# Patient Record
Sex: Female | Born: 1935
Health system: Southern US, Community
[De-identification: ages and names within clinical notes are randomized; demographics above are authoritative.]

## PROBLEM LIST (undated history)

## (undated) DIAGNOSIS — I10 Essential (primary) hypertension: Secondary | ICD-10-CM

## (undated) DIAGNOSIS — K219 Gastro-esophageal reflux disease without esophagitis: Secondary | ICD-10-CM

## (undated) DIAGNOSIS — M48061 Spinal stenosis, lumbar region without neurogenic claudication: Secondary | ICD-10-CM

## (undated) DIAGNOSIS — K5790 Diverticulosis of intestine, part unspecified, without perforation or abscess without bleeding: Secondary | ICD-10-CM

## (undated) DIAGNOSIS — G2581 Restless legs syndrome: Secondary | ICD-10-CM

## (undated) DIAGNOSIS — W19XXXA Unspecified fall, initial encounter: Secondary | ICD-10-CM

## (undated) DIAGNOSIS — M179 Osteoarthritis of knee, unspecified: Secondary | ICD-10-CM

## (undated) DIAGNOSIS — R296 Repeated falls: Secondary | ICD-10-CM

## (undated) DIAGNOSIS — K589 Irritable bowel syndrome without diarrhea: Secondary | ICD-10-CM

## (undated) DIAGNOSIS — M1990 Unspecified osteoarthritis, unspecified site: Secondary | ICD-10-CM

## (undated) DIAGNOSIS — E7800 Pure hypercholesterolemia, unspecified: Secondary | ICD-10-CM

## (undated) DIAGNOSIS — M1710 Unilateral primary osteoarthritis, unspecified knee: Secondary | ICD-10-CM

## (undated) DIAGNOSIS — C50919 Malignant neoplasm of unspecified site of unspecified female breast: Secondary | ICD-10-CM

## (undated) DIAGNOSIS — E559 Vitamin D deficiency, unspecified: Secondary | ICD-10-CM

## (undated) DIAGNOSIS — I4891 Unspecified atrial fibrillation: Secondary | ICD-10-CM

## (undated) HISTORY — DX: Essential (primary) hypertension: I10

## (undated) HISTORY — DX: Irritable bowel syndrome, unspecified: K58.9

## (undated) HISTORY — DX: Osteoarthritis of knee, unspecified: M17.9

## (undated) HISTORY — DX: Unspecified atrial fibrillation: I48.91

## (undated) HISTORY — DX: Unilateral primary osteoarthritis, unspecified knee: M17.10

## (undated) HISTORY — DX: Unspecified osteoarthritis, unspecified site: M19.90

## (undated) HISTORY — DX: Restless legs syndrome: G25.81

## (undated) HISTORY — DX: Vitamin D deficiency, unspecified: E55.9

## (undated) HISTORY — DX: Diverticulosis of intestine, part unspecified, without perforation or abscess without bleeding: K57.90

## (undated) HISTORY — DX: Unspecified fall, initial encounter: W19.XXXA

## (undated) HISTORY — DX: Malignant neoplasm of unspecified site of unspecified female breast: C50.919

## (undated) HISTORY — DX: Pure hypercholesterolemia, unspecified: E78.00

## (undated) HISTORY — PX: WRIST FRACTURE SURGERY: SHX121

## (undated) HISTORY — DX: Spinal stenosis, lumbar region without neurogenic claudication: M48.061

## (undated) HISTORY — DX: Repeated falls: R29.6

## (undated) HISTORY — DX: Gastro-esophageal reflux disease without esophagitis: K21.9

---

## 1985-05-26 HISTORY — PX: MASTECTOMY, RADICAL: SHX710

## 1986-05-26 HISTORY — PX: BREAST RECONSTRUCTION: SHX9

## 1998-03-20 ENCOUNTER — Other Ambulatory Visit: Admission: RE | Admit: 1998-03-20 | Discharge: 1998-03-20 | Payer: Self-pay | Admitting: Obstetrics and Gynecology

## 1999-03-12 ENCOUNTER — Other Ambulatory Visit: Admission: RE | Admit: 1999-03-12 | Discharge: 1999-03-12 | Payer: Self-pay | Admitting: Obstetrics and Gynecology

## 1999-05-23 ENCOUNTER — Encounter: Payer: Self-pay | Admitting: Obstetrics and Gynecology

## 1999-05-23 ENCOUNTER — Encounter: Admission: RE | Admit: 1999-05-23 | Discharge: 1999-05-23 | Payer: Self-pay | Admitting: Obstetrics and Gynecology

## 1999-06-06 ENCOUNTER — Ambulatory Visit (HOSPITAL_COMMUNITY): Admission: RE | Admit: 1999-06-06 | Discharge: 1999-06-06 | Payer: Self-pay | Admitting: Gastroenterology

## 2000-04-14 ENCOUNTER — Other Ambulatory Visit: Admission: RE | Admit: 2000-04-14 | Discharge: 2000-04-14 | Payer: Self-pay | Admitting: Obstetrics and Gynecology

## 2000-05-25 ENCOUNTER — Encounter: Admission: RE | Admit: 2000-05-25 | Discharge: 2000-05-25 | Payer: Self-pay | Admitting: Oncology

## 2000-05-25 ENCOUNTER — Encounter (HOSPITAL_COMMUNITY): Payer: Self-pay | Admitting: Oncology

## 2001-04-27 ENCOUNTER — Encounter (HOSPITAL_COMMUNITY): Admission: RE | Admit: 2001-04-27 | Discharge: 2001-05-27 | Payer: Self-pay | Admitting: Oncology

## 2001-04-27 ENCOUNTER — Encounter: Admission: RE | Admit: 2001-04-27 | Discharge: 2001-04-27 | Payer: Self-pay | Admitting: Oncology

## 2001-06-01 ENCOUNTER — Encounter (HOSPITAL_COMMUNITY): Payer: Self-pay | Admitting: Oncology

## 2001-06-01 ENCOUNTER — Encounter: Admission: RE | Admit: 2001-06-01 | Discharge: 2001-06-01 | Payer: Self-pay | Admitting: Oncology

## 2001-06-22 ENCOUNTER — Other Ambulatory Visit: Admission: RE | Admit: 2001-06-22 | Discharge: 2001-06-22 | Payer: Self-pay | Admitting: Obstetrics and Gynecology

## 2002-04-29 ENCOUNTER — Encounter: Admission: RE | Admit: 2002-04-29 | Discharge: 2002-04-29 | Payer: Self-pay | Admitting: Oncology

## 2002-04-29 ENCOUNTER — Encounter (HOSPITAL_COMMUNITY): Admission: RE | Admit: 2002-04-29 | Discharge: 2002-05-29 | Payer: Self-pay | Admitting: Oncology

## 2002-06-02 ENCOUNTER — Encounter (HOSPITAL_COMMUNITY): Payer: Self-pay | Admitting: Oncology

## 2002-06-02 ENCOUNTER — Encounter: Admission: RE | Admit: 2002-06-02 | Discharge: 2002-06-02 | Payer: Self-pay | Admitting: Oncology

## 2002-06-07 ENCOUNTER — Other Ambulatory Visit: Admission: RE | Admit: 2002-06-07 | Discharge: 2002-06-07 | Payer: Self-pay | Admitting: Obstetrics and Gynecology

## 2003-05-01 ENCOUNTER — Encounter (HOSPITAL_COMMUNITY): Admission: RE | Admit: 2003-05-01 | Discharge: 2003-05-31 | Payer: Self-pay | Admitting: Oncology

## 2003-05-01 ENCOUNTER — Encounter: Admission: RE | Admit: 2003-05-01 | Discharge: 2003-05-01 | Payer: Self-pay | Admitting: Oncology

## 2003-05-27 HISTORY — PX: KNEE ARTHROSCOPY: SUR90

## 2003-06-05 ENCOUNTER — Encounter: Admission: RE | Admit: 2003-06-05 | Discharge: 2003-06-05 | Payer: Self-pay | Admitting: Oncology

## 2003-06-05 ENCOUNTER — Encounter (HOSPITAL_COMMUNITY): Admission: RE | Admit: 2003-06-05 | Discharge: 2003-07-05 | Payer: Self-pay | Admitting: Oncology

## 2003-06-06 ENCOUNTER — Ambulatory Visit (HOSPITAL_COMMUNITY): Admission: RE | Admit: 2003-06-06 | Discharge: 2003-06-06 | Payer: Self-pay | Admitting: Oncology

## 2004-04-29 ENCOUNTER — Encounter: Admission: RE | Admit: 2004-04-29 | Discharge: 2004-05-24 | Payer: Self-pay | Admitting: Oncology

## 2004-04-29 ENCOUNTER — Encounter (HOSPITAL_COMMUNITY): Admission: RE | Admit: 2004-04-29 | Discharge: 2004-05-24 | Payer: Self-pay | Admitting: Oncology

## 2004-04-29 ENCOUNTER — Ambulatory Visit (HOSPITAL_COMMUNITY): Payer: Self-pay | Admitting: Oncology

## 2004-06-06 ENCOUNTER — Encounter (HOSPITAL_COMMUNITY): Admission: RE | Admit: 2004-06-06 | Discharge: 2004-07-06 | Payer: Self-pay | Admitting: Oncology

## 2004-06-06 ENCOUNTER — Encounter: Admission: RE | Admit: 2004-06-06 | Discharge: 2004-06-06 | Payer: Self-pay | Admitting: Oncology

## 2005-04-28 ENCOUNTER — Encounter: Admission: RE | Admit: 2005-04-28 | Discharge: 2005-04-28 | Payer: Self-pay | Admitting: Oncology

## 2005-04-28 ENCOUNTER — Encounter (HOSPITAL_COMMUNITY): Admission: RE | Admit: 2005-04-28 | Discharge: 2005-04-28 | Payer: Self-pay | Admitting: Oncology

## 2005-04-28 ENCOUNTER — Ambulatory Visit (HOSPITAL_COMMUNITY): Payer: Self-pay | Admitting: Oncology

## 2005-06-09 ENCOUNTER — Ambulatory Visit (HOSPITAL_COMMUNITY): Admission: RE | Admit: 2005-06-09 | Discharge: 2005-06-09 | Payer: Self-pay | Admitting: Internal Medicine

## 2006-06-03 ENCOUNTER — Encounter (HOSPITAL_COMMUNITY): Admission: RE | Admit: 2006-06-03 | Discharge: 2006-07-03 | Payer: Self-pay | Admitting: Oncology

## 2006-06-03 ENCOUNTER — Ambulatory Visit (HOSPITAL_COMMUNITY): Payer: Self-pay | Admitting: Oncology

## 2007-06-02 ENCOUNTER — Ambulatory Visit (HOSPITAL_COMMUNITY): Payer: Self-pay | Admitting: Oncology

## 2007-06-02 ENCOUNTER — Encounter (HOSPITAL_COMMUNITY): Admission: RE | Admit: 2007-06-02 | Discharge: 2007-07-02 | Payer: Self-pay | Admitting: Oncology

## 2007-06-21 ENCOUNTER — Ambulatory Visit (HOSPITAL_COMMUNITY): Admission: RE | Admit: 2007-06-21 | Discharge: 2007-06-21 | Payer: Self-pay | Admitting: Oncology

## 2008-01-13 ENCOUNTER — Emergency Department (HOSPITAL_COMMUNITY): Admission: EM | Admit: 2008-01-13 | Discharge: 2008-01-14 | Payer: Self-pay | Admitting: Emergency Medicine

## 2008-03-01 ENCOUNTER — Encounter: Admission: RE | Admit: 2008-03-01 | Discharge: 2008-03-01 | Payer: Self-pay | Admitting: General Surgery

## 2008-05-23 ENCOUNTER — Ambulatory Visit: Payer: Self-pay | Admitting: Cardiovascular Disease

## 2008-05-31 ENCOUNTER — Encounter (HOSPITAL_COMMUNITY): Admission: RE | Admit: 2008-05-31 | Discharge: 2008-06-30 | Payer: Self-pay | Admitting: Oncology

## 2008-05-31 ENCOUNTER — Ambulatory Visit (HOSPITAL_COMMUNITY): Payer: Self-pay | Admitting: Oncology

## 2009-04-13 DIAGNOSIS — G2581 Restless legs syndrome: Secondary | ICD-10-CM | POA: Insufficient documentation

## 2009-04-13 DIAGNOSIS — M1990 Unspecified osteoarthritis, unspecified site: Secondary | ICD-10-CM

## 2009-04-13 DIAGNOSIS — E785 Hyperlipidemia, unspecified: Secondary | ICD-10-CM | POA: Insufficient documentation

## 2009-04-16 ENCOUNTER — Encounter: Payer: Self-pay | Admitting: Physician Assistant

## 2009-04-16 ENCOUNTER — Ambulatory Visit: Payer: Self-pay | Admitting: Internal Medicine

## 2009-04-16 ENCOUNTER — Encounter: Payer: Self-pay | Admitting: Cardiovascular Disease

## 2009-04-16 DIAGNOSIS — I4891 Unspecified atrial fibrillation: Secondary | ICD-10-CM | POA: Insufficient documentation

## 2009-04-20 ENCOUNTER — Ambulatory Visit: Payer: Self-pay | Admitting: Internal Medicine

## 2009-04-26 ENCOUNTER — Ambulatory Visit: Payer: Self-pay

## 2009-04-26 ENCOUNTER — Encounter: Payer: Self-pay | Admitting: Cardiovascular Disease

## 2009-04-26 ENCOUNTER — Ambulatory Visit: Payer: Self-pay | Admitting: Cardiovascular Disease

## 2009-04-26 ENCOUNTER — Ambulatory Visit: Payer: Self-pay | Admitting: Cardiology

## 2009-04-26 ENCOUNTER — Ambulatory Visit (HOSPITAL_COMMUNITY): Admission: RE | Admit: 2009-04-26 | Discharge: 2009-04-26 | Payer: Self-pay | Admitting: Cardiovascular Disease

## 2009-04-26 LAB — CONVERTED CEMR LAB: POC INR: 1.9

## 2009-05-03 ENCOUNTER — Ambulatory Visit: Payer: Self-pay | Admitting: Cardiovascular Disease

## 2009-05-11 ENCOUNTER — Telehealth (INDEPENDENT_AMBULATORY_CARE_PROVIDER_SITE_OTHER): Payer: Self-pay | Admitting: *Deleted

## 2009-05-11 ENCOUNTER — Ambulatory Visit: Payer: Self-pay | Admitting: Cardiovascular Disease

## 2009-06-21 ENCOUNTER — Ambulatory Visit (HOSPITAL_COMMUNITY): Admission: RE | Admit: 2009-06-21 | Discharge: 2009-06-21 | Payer: Self-pay | Admitting: Oncology

## 2009-06-27 ENCOUNTER — Ambulatory Visit (HOSPITAL_COMMUNITY): Admission: RE | Admit: 2009-06-27 | Discharge: 2009-06-27 | Payer: Self-pay | Admitting: Oncology

## 2009-07-02 ENCOUNTER — Ambulatory Visit (HOSPITAL_COMMUNITY): Payer: Self-pay | Admitting: Oncology

## 2009-07-02 ENCOUNTER — Encounter (HOSPITAL_COMMUNITY): Admission: RE | Admit: 2009-07-02 | Discharge: 2009-08-01 | Payer: Self-pay | Admitting: Oncology

## 2009-11-06 ENCOUNTER — Ambulatory Visit: Payer: Self-pay | Admitting: Cardiovascular Disease

## 2010-02-18 ENCOUNTER — Encounter: Payer: Self-pay | Admitting: Internal Medicine

## 2010-05-21 ENCOUNTER — Encounter: Payer: Self-pay | Admitting: Internal Medicine

## 2010-05-21 ENCOUNTER — Telehealth: Payer: Self-pay | Admitting: Internal Medicine

## 2010-05-24 ENCOUNTER — Ambulatory Visit: Admission: RE | Admit: 2010-05-24 | Discharge: 2010-05-24 | Payer: Self-pay | Source: Home / Self Care

## 2010-05-24 ENCOUNTER — Encounter (INDEPENDENT_AMBULATORY_CARE_PROVIDER_SITE_OTHER): Payer: Self-pay | Admitting: Radiology

## 2010-05-24 ENCOUNTER — Encounter: Payer: Self-pay | Admitting: Cardiovascular Disease

## 2010-05-24 ENCOUNTER — Ambulatory Visit (HOSPITAL_COMMUNITY)
Admission: RE | Admit: 2010-05-24 | Discharge: 2010-05-24 | Payer: Self-pay | Source: Home / Self Care | Attending: Cardiovascular Disease | Admitting: Cardiovascular Disease

## 2010-05-24 LAB — CONVERTED CEMR LAB: POC INR: 2.6

## 2010-05-26 HISTORY — PX: CARDIOVERSION: SHX1299

## 2010-05-28 ENCOUNTER — Encounter: Payer: Self-pay | Admitting: Physician Assistant

## 2010-05-29 ENCOUNTER — Other Ambulatory Visit: Payer: Self-pay | Admitting: Physician Assistant

## 2010-05-29 ENCOUNTER — Ambulatory Visit
Admission: RE | Admit: 2010-05-29 | Discharge: 2010-05-29 | Payer: Self-pay | Source: Home / Self Care | Attending: Cardiovascular Disease | Admitting: Cardiovascular Disease

## 2010-05-29 ENCOUNTER — Ambulatory Visit: Admission: RE | Admit: 2010-05-29 | Discharge: 2010-05-29 | Payer: Self-pay | Source: Home / Self Care

## 2010-05-29 ENCOUNTER — Ambulatory Visit
Admission: RE | Admit: 2010-05-29 | Discharge: 2010-05-29 | Payer: Self-pay | Source: Home / Self Care | Attending: Physician Assistant | Admitting: Physician Assistant

## 2010-05-29 ENCOUNTER — Encounter: Payer: Self-pay | Admitting: Physician Assistant

## 2010-05-29 DIAGNOSIS — I420 Dilated cardiomyopathy: Secondary | ICD-10-CM | POA: Insufficient documentation

## 2010-05-29 LAB — CBC WITH DIFFERENTIAL/PLATELET
Basophils Absolute: 0 10*3/uL (ref 0.0–0.1)
Basophils Relative: 0.3 % (ref 0.0–3.0)
Eosinophils Absolute: 0.1 10*3/uL (ref 0.0–0.7)
Eosinophils Relative: 0.7 % (ref 0.0–5.0)
HCT: 44.2 % (ref 36.0–46.0)
Hemoglobin: 15.1 g/dL — ABNORMAL HIGH (ref 12.0–15.0)
Lymphocytes Relative: 20.3 % (ref 12.0–46.0)
Lymphs Abs: 2.3 10*3/uL (ref 0.7–4.0)
MCHC: 34.2 g/dL (ref 30.0–36.0)
MCV: 91.2 fl (ref 78.0–100.0)
Monocytes Absolute: 0.9 10*3/uL (ref 0.1–1.0)
Monocytes Relative: 7.6 % (ref 3.0–12.0)
Neutro Abs: 8 10*3/uL — ABNORMAL HIGH (ref 1.4–7.7)
Neutrophils Relative %: 71.1 % (ref 43.0–77.0)
Platelets: 241 10*3/uL (ref 150.0–400.0)
RBC: 4.84 Mil/uL (ref 3.87–5.11)
RDW: 14.2 % (ref 11.5–14.6)
WBC: 11.2 10*3/uL — ABNORMAL HIGH (ref 4.5–10.5)

## 2010-05-29 LAB — BASIC METABOLIC PANEL
BUN: 12 mg/dL (ref 6–23)
CO2: 28 mEq/L (ref 19–32)
Calcium: 9.5 mg/dL (ref 8.4–10.5)
Chloride: 105 mEq/L (ref 96–112)
Creatinine, Ser: 0.7 mg/dL (ref 0.4–1.2)
GFR: 85.12 mL/min (ref >60.00)
Glucose, Bld: 91 mg/dL (ref 70–99)
Potassium: 4.4 mEq/L (ref 3.5–5.1)
Sodium: 140 mEq/L (ref 135–145)

## 2010-05-29 LAB — BRAIN NATRIURETIC PEPTIDE: Pro B Natriuretic peptide (BNP): 941.1 pg/mL — ABNORMAL HIGH (ref 0.0–100.0)

## 2010-05-29 LAB — TSH: TSH: 3.57 u[IU]/mL (ref 0.35–5.50)

## 2010-06-05 ENCOUNTER — Telehealth (INDEPENDENT_AMBULATORY_CARE_PROVIDER_SITE_OTHER): Payer: Self-pay | Admitting: *Deleted

## 2010-06-06 ENCOUNTER — Ambulatory Visit: Admission: RE | Admit: 2010-06-06 | Discharge: 2010-06-06 | Payer: Self-pay | Source: Home / Self Care

## 2010-06-06 ENCOUNTER — Encounter: Payer: Self-pay | Admitting: Cardiovascular Disease

## 2010-06-06 ENCOUNTER — Encounter (HOSPITAL_COMMUNITY)
Admission: RE | Admit: 2010-06-06 | Discharge: 2010-06-25 | Payer: Self-pay | Source: Home / Self Care | Attending: Cardiovascular Disease | Admitting: Cardiovascular Disease

## 2010-06-07 ENCOUNTER — Ambulatory Visit
Admission: RE | Admit: 2010-06-07 | Discharge: 2010-06-07 | Payer: Self-pay | Source: Home / Self Care | Attending: Physician Assistant | Admitting: Physician Assistant

## 2010-06-07 ENCOUNTER — Other Ambulatory Visit: Payer: Self-pay | Admitting: Physician Assistant

## 2010-06-07 ENCOUNTER — Encounter: Payer: Self-pay | Admitting: Physician Assistant

## 2010-06-07 DIAGNOSIS — I5020 Unspecified systolic (congestive) heart failure: Secondary | ICD-10-CM | POA: Insufficient documentation

## 2010-06-07 LAB — BASIC METABOLIC PANEL
BUN: 11 mg/dL (ref 6–23)
CO2: 32 mEq/L (ref 19–32)
Calcium: 9.2 mg/dL (ref 8.4–10.5)
Chloride: 103 mEq/L (ref 96–112)
Creatinine, Ser: 0.7 mg/dL (ref 0.4–1.2)
GFR: 91 mL/min (ref >60.00)
Glucose, Bld: 98 mg/dL (ref 70–99)
Potassium: 3.9 mEq/L (ref 3.5–5.1)
Sodium: 144 mEq/L (ref 135–145)

## 2010-06-07 LAB — BRAIN NATRIURETIC PEPTIDE: Pro B Natriuretic peptide (BNP): 521.9 pg/mL — ABNORMAL HIGH (ref 0.0–100.0)

## 2010-06-13 ENCOUNTER — Ambulatory Visit: Admission: RE | Admit: 2010-06-13 | Discharge: 2010-06-13 | Payer: Self-pay | Source: Home / Self Care

## 2010-06-13 LAB — CONVERTED CEMR LAB: POC INR: 1.6

## 2010-06-20 ENCOUNTER — Ambulatory Visit: Admission: RE | Admit: 2010-06-20 | Discharge: 2010-06-20 | Payer: Self-pay | Source: Home / Self Care

## 2010-06-20 ENCOUNTER — Ambulatory Visit: Admit: 2010-06-20 | Payer: Self-pay | Admitting: Cardiovascular Disease

## 2010-06-24 ENCOUNTER — Ambulatory Visit (HOSPITAL_COMMUNITY)
Admission: RE | Admit: 2010-06-24 | Discharge: 2010-06-24 | Payer: Self-pay | Source: Home / Self Care | Attending: Oncology | Admitting: Oncology

## 2010-06-27 NOTE — Progress Notes (Signed)
Summary: Nuclear Pre-Procedure  Phone Note Outgoing Call Call back at [facility] Phone [phone]   Call placed by: Stanton Kidney, EMT-P,  June 05, 2010 11:33 AM Call placed to: Patient Action Taken: Phone Call Completed Summary of Call: Reviewed information on Myoview Information Sheet (see scanned document for further details).  Spoke with the patient. Stanton Kidney, EMT-P  June 05, 2010 11:33 AM     Nuclear Med Background Indications for Stress Test: Evaluation for Ischemia   History: Echo  History Comments: 12/11 Echo: EF=30%, mild AS  Symptoms: Chest Pressure with Exertion, Chest Tightness, Diaphoresis, DOE, Light-Headedness, Nausea, SOB    Nuclear Pre-Procedure Cardiac Risk Factors: Family History - CAD, Lipids Height (in): 65  Nuclear Med Study Referring MD:  Dr Eden Emms

## 2010-06-27 NOTE — Medication Information (Signed)
Summary: rov/ewj  Anticoagulant Therapy  Managed by: Weston Brass, PharmD Referring MD: Dr Eden Emms PCP: Dr Marisue Brooklyn Supervising MD: Myrtis Ser MD, Tinnie Gens Indication 1: Atrial Fibrillation Lab Used: [facility] [place] Site: [address] INR POC 2.1 INR RANGE 2.0-3.0  Dietary changes: no    Health status changes: no    Bleeding/hemorrhagic complications: no    Recent/future hospitalizations: no    Any changes in medication regimen? no    Recent/future dental: no  Any missed doses?: no       Is patient compliant with meds? yes       Allergies: No Known Drug Allergies  Anticoagulation Management History:      The patient is taking warfarin and comes in today for a routine follow up visit.  Positive risk factors for bleeding include an age of 75 years or older.  The bleeding index is 'intermediate risk'.  Positive CHADS2 values include History of CHF and Age > 75 years old.  Anticoagulation responsible provider: Myrtis Ser MD, Tinnie Gens.  INR POC: 2.1.  Cuvette Lot#: [id number].  Exp: 06/2011.    Anticoagulation Management Assessment/Plan:      The patient's current anticoagulation dose is Warfarin sodium 2.5 mg tabs: take one tablet daily as directed by anticoagulation clinic.  The target INR is 2.0-3.0.  The next INR is due 07/09/2010.  Anticoagulation instructions were given to patient.  Results were reviewed/authorized by Weston Brass, PharmD.  She was notified by Linward Headland, PharmD candidate.         Prior Anticoagulation Instructions: INR 1.6  Start taking 1/2 tablet daily except 1 tablet on Thursdays.  Recheck in 1 week.   Current Anticoagulation Instructions: INR 2.1 (INR goal: 2-3)  Take 1/2 tablet everyday except 1 tablet on Thursdays.  Recheck on 02/14.

## 2010-06-27 NOTE — Assessment & Plan Note (Signed)
Summary: f1w...cmf/ f/u myoview/ chest xray./sl   Visit Type:  Follow-up Primary Provider:  Dr Marisue Brooklyn   History of Present Illness: Primary Cardiologist:  Dr. Charlton Haws  Pamela Whitaker is a 77 -year-old female with a history of paroxysmal atrial fibrillation.  She was placed on Multaq for about 6 months and maintained sinus rhythm.  Dr. Eden Emms stopped this medication in June of 2011.  She recently called the office with complaints of shortness of breath and an electrocardiogram demonstrated recurrent atrial fibrillation with rapid ventricular rate.  She was placed on coumadin and metoprolol and an echocardiogram was obtained.  She has reduced ejection fraction at 30% with diffuse hypokinesis, very mild AS with a mean gradient of 4, mild MR, mild LAE and mod RAE and PASP 35 mmHg.  I saw her last week and thought she was in systolic heart failure.  I placed her on Lasix.  Lab work demonstrated a BNP of 941.  Her chest x-ray did show edema.  Her creatinine was normal at 0.7 and hemoglobin was 15.1.  TSH was normal at 3.57.  She was set up for a Myoview study which was done yesterday.  This demonstrated no scar or ischemia and her ejection fraction is 31%.  She returns for followup.  She feels much better.  She is down about 4 pounds by our scales since her last visit.  She denies orthopnea, PND or pedal edema.  She denies significant shortness of breath.  She denies any further chest discomfort.  No cough.  Current Medications (verified): 1)  Glucosamine-Chondroitin  Caps (Glucosamine-Chondroit-Vit C-Mn) .... Take One Twice Daily 2)  Melatonin 5 Mg Caps (Melatonin) .... Take Two At Bedtime 3)  Warfarin Sodium 2.5 Mg Tabs (Warfarin Sodium) .... Take One Tablet Daily As Directed By Anticoagulation Clinic 4)  Metoprolol Tartrate 50 Mg Tabs (Metoprolol Tartrate) .... Take 1 1/2 Tablets Twice Daily 5)  Lunesta 2 Mg Tabs (Eszopiclone) .... Take One Tablet At Bedtime 6)  Furosemide 20 Mg Tabs  (Furosemide) .... Take 1 Tablet Once Daily  Allergies (verified): No Known Drug Allergies  Past History:  Past Medical History: Parox AFib   a. treated with Multaq x 6 mos in 2011   b. CHADS2=2 (+Age; +CHF; no HTN,  no CVA; no DM2) Echo 05/24/10: EF 30%; mild AS (mean 4 mmHg); mild MR; mild LAE; mod RAE; PASP 35 Systolic CHF Dilated Cardiomyopathy   a. tachy mediated Myoview 06/06/10:  EF 31%; no scar or ischemia breast cancer in 1987 with a left  mastectomy hyperlipidemia previous right fibular fracture, operated on by Dr. Ranell Patrick osteoporosis restless leg syndrome osteoarthritis history of bone chips in the right knee lumbar stenosis previous right knee arthroscopy in 2005.    Review of Systems       As per  the HPI.  All other systems reviewed and negative.   Vital Signs:  Patient profile:   77 year old female Height:      65 inches Weight:      148 pounds BMI:     24.72 Pulse rate:   104 / minute BP sitting:   110 / 82  (left arm)  Vitals Entered By: Laurance Flatten CMA (June 07, 2010 11:15 AM)  Physical Exam  General:  Well nourished, well developed, in no acute distress HEENT: normal Neck: no JVD Cardiac:  normal S1, S2; irreg irreg rhythm Lungs:  clear to auscultation bilaterally, no wheezing, rhonchi or rales Abd: soft, nontender, no hepatomegaly Ext:  no  edema Vascular: no carotid  bruits Skin: warm and dry Neuro:  CNs 2-12 intact, no focal abnormalities noted Endo: no thyromegaly   EKG  Procedure date:  06/07/2010  Findings:      atrial fibrillation Heart rate 104 PVCs Nonspecific ST-T wave changes Incomplete right bundle branch block Poor R-wave progression  Impression & Recommendations:  Problem # 1:  ATRIAL FIBRILLATION (ICD-427.31) She remains in atrial fibrillation.  I repeated her blood pressure and it is 138/90.  She has not taken her metoprolol yet today.  I have recommended that she increase her metoprolol to 100 mg twice a day  for better rate control.  I explained importance of taking this medication at this time.  Please see the explanation below.  I have given her a recommendation of taking it at 8:30 AM and 8:30 PM every day.  She did ask about taking Pradaxa today.  Since she is 66, I have recommended that she remain on Coumadin.  Orders: EKG w/ Interpretation (93000)  Problem # 2:  CARDIOMYOPATHY (ICD-425.4) With her Myoview study being negative for ischemia or scar, this appears to be a rate related cardiomyopathy.  As above, we will continue with rate control.  Orders: TLB-BMP (Basic Metabolic Panel-BMET) (80048-METABOL) TLB-BNP (B-Natriuretic Peptide) (83880-BNPR)  Problem # 3:  UNSPECIFIED SYSTOLIC HEART FAILURE (ICD-428.20) Assessment: Improved She is improved.  Her weight is down.  Her symptoms are better.  She will have a basic metabolic panel and BNP today.  If her heart rate is better and her blood pressure will allow we can consider starting an ACE inhibitor at her next visit.  Patient Instructions: 1)  Your physician has recommended you make the following change in your medication: Increase metoprolol tartrate to 100 mg two times a day.  Take it at 8:30 am and 8:30 pm.  You can take 2 of the 50 mg tablets to get 100 mg. 2)  Your physician recommends that you schedule a follow-up appointment in: Keep appointment with Dr. Eden Emms on 05/2610 at 11:00 AM 3)  Your physician recommends that you return for lab work in: Today BMET,BNP. Prescriptions: METOPROLOL TARTRATE 100 MG TABS (METOPROLOL TARTRATE) Take 1 tablet by mouth two times a day  #60 x 5   Entered and Authorized by:   Tereso Newcomer PA-C   Signed by:   Tereso Newcomer PA-C on 06/07/2010   Method used:   Electron[facility] to        [facility] [id number]       [address]       [place], [place]  [address]       Ph: [phone]       Fax: [phone]   RxID:   [id number]

## 2010-06-27 NOTE — Assessment & Plan Note (Signed)
Summary: per pt call she is SOB/lg   Primary Provider:  Dr Marisue Brooklyn  CC:  shortness of breath and chest tightness.  History of Present Illness: Primary Cardiologist:  Dr. Charlton Haws  Pamela Whitaker is a 77 -year-old female with a history of paroxysmal atrial fibrillation.  She was placed on Multaq for about 6 months and maintained sinus rhythm.  Dr. Eden Emms stopped this medication in June of 2011.  She recently called the office with complaints of shortness of breath and an electrocardiogram demonstrated recurrent atrial fibrillation with rapid ventricular rate.  She was placed on metoprolol and echocardiogram was obtained.  She has reduced ejection fraction at 30% with diffuse hypokinesis, very mild AS with a mean gradient of 4, mild MR, mild LAE and mod RAE and PASP 35 mmHg.  She is seen today for evaluation.  She has noted DOE for the last 6 weeks or so.  She describes NYHA Class 3 symptoms.  She notes 2 pillow orthopnea and PND.  She describes chest heaviness ("like an elephant") with exertion.  No radiation.  She does describe some nausea and diaphoresis.  She denies syncope but has been lightheaded.  She denies tachypalps.  She felt short of breath when she was in Afib previously, but not this bad.  She saw her PCP yesterday and was given Lunesta to help with sleep and she was told to increase metoprolol to 75 mg two times a day . . .but only took one dose and went back to 50 mg two times a day this am.  She notes feeling nauseated and fatigued when she first started the metoprolol, and this seems to be a little better.  Current Medications (verified): 1)  Glucosamine-Chondroitin  Caps (Glucosamine-Chondroit-Vit C-Mn) .... Take One Twice Daily 2)  Melatonin 5 Mg Caps (Melatonin) .... Take Two At Bedtime 3)  Warfarin Sodium 2.5 Mg Tabs (Warfarin Sodium) .... Take One Tablet Daily As Directed By Anticoagulation Clinic 4)  Metoprolol Tartrate 50 Mg Tabs (Metoprolol Tartrate) .... Take One  Tablet By Mouth Twice A Day 5)  Lunesta 2 Mg Tabs (Eszopiclone) .... Take One Tablet At Bedtime  Allergies (verified): No Known Drug Allergies  Past History:  Past Medical History: Parox AFib   a. treated with Multaq x 6 mos in 2011   b. CHADS2=1 (Age; no HTN; no CVA; no DM2; no h/o CHF) breast cancer in 1987 with a left  mastectomy hyperlipidemia previous right fibular fracture, operated on by Dr. Ranell Patrick osteoporosis restless leg syndrome osteoarthritis history of bone chips in the right knee lumbar stenosis previous right knee arthroscopy in 2005.    Family History: Reviewed history from 04/13/2009 and no changes required.  Remarkable for father dying at age 89 of an MI.  Her   mother died at age 15 from an aortic aneurysm.      Social History: She is happily married.  She has two children.   She does not smoke or drink.    Review of Systems       She had a recent URI around Thanksgiving that was treated with a zpak and neb. tx x 1 in her PCPs office.  Otherwise, as per  the HPI.  All other systems reviewed and negative.   Vital Signs:  Patient profile:   77 year old female Height:      65 inches Weight:      152 pounds BMI:     25.39 Pulse rate:   90 / minute  BP sitting:   124 / 78  (right arm) Cuff size:   regular  Vitals Entered By: Haze Boyden, CMA (May 29, 2010 11:18 AM)  Physical Exam  General:  Well nourished, well developed, in no acute distress HEENT: normal Neck: + JVD and + HJR Cardiac:  normal S1, S2; RRR; no murmur Lungs:  clear to auscultation bilaterally, no wheezing, rhonchi or rales Abd: soft, nontender, no hepatomegaly Ext: no  edema Vascular: no carotid  bruits Skin: warm and dry Neuro:  CNs 2-12 intact, no focal abnormalities noted Endo: no thyromegaly   EKG  Procedure date:  05/29/2010  Findings:      atrial fibrillation  Heart rate 90 Normal axis Occasional PVCs No ischemic changes  Impression &  Recommendations:  Problem # 1:  ATRIAL FIBRILLATION (ICD-427.31) CHADS2 is now 2 with CHF; CHADS-VASc is 3.  She will need coumadin therapy. Rate better controlled. See discussion below.  Orders: EKG w/ Interpretation (93000) TLB-TSH (Thyroid Stimulating Hormone) (84443-TSH) Nuclear Stress Test (Nuc Stress Test)  Problem # 2:  CARDIOMYOPATHY (ICD-425.4) She has evidence of volume overload on exam with distended neck veins. Check BMET, CBC, TSH and BNP and CXR. Start low dose Lasix at 20 mg once daily. Repeat BMET in one week. Change metoprolol to 75 mg two times a day.  Could consider carvedilol, but will hold off at this time due to need for rate control. ? if CM is rate related.  She has chest pain.  D/w Dr. Antoine Poche.  Will get a myoview to assess for ischemia. Follow up with me in the next 7-10 days and Dr. Eden Emms in 3 weeks or sooner as needed. With coumadin, will hold off on ASA.    Other Orders: TLB-BMP (Basic Metabolic Panel-BMET) (80048-METABOL) TLB-BNP (B-Natriuretic Peptide) (83880-BNPR) TLB-CBC Platelet - w/Differential (85025-CBCD) T-2 View CXR (71020TC)  Patient Instructions: 1)  Your physician recommends that you schedule a follow-up appointment in: IN 1 WEEK WITH [place], [place] 06/05/10 @ 9:30 AM. PLEASE ALSO KEEP YOUR APPOINTMENT WITH DR. Eden Emms ON 06/20/10. 2)  Your physician recommends that you return for lab work in: TODAY FOR A BMET 425.4, BNP 425.4, CBC 333.94, TSH 427.31 3)  Laquilla Dault, PA WOULD LIKE FOR YOU TO HAVE A REPEAT BMET ON YOUR APPOINTMENT 06/05/10 WHEN YOU SEE HIM. 4)  A chest x-ray takes a picture of the organs and structures inside the chest, including the heart, lungs, and blood vessels. This test can show several things, including, whether the heart is enlarged; whether fluid is building up in the lungs; and whether pacemaker / defibrillator leads are still in place. YOU CAN HAVE THIS DONE AT OUR [facility] OFFICE. 5)  Your physician has recommended  you make the following change in your medication: INCREASE YOUR METOPROLOL TARTRATE 50 MG 1 1/2 TABLET TWICE DAILY. ALSO START FUROSEMIDE 20 MG ONCE DAILY Prescriptions: FUROSEMIDE 20 MG TABS (FUROSEMIDE) TAKE 1 TABLET ONCE DAILY  #30 x 6   Entered by:   Danielle Rankin, CMA   Authorized by:   Colon Branch, MD, [facility]   Signed by:   Danielle Rankin, CMA on 05/29/2010   Method used:   [facility]        [facility]  [address] [id number]       [address]       [place], [place]  [address]       Ph: [phone]  Fax: [phone]   RxID:   [id number]  I have personally reviewed the prescriptions today for accuracy.Tereso Newcomer PA-C  May 29, 2010 5:08 PM

## 2010-06-27 NOTE — Assessment & Plan Note (Signed)
Summary: F6M/DM   Visit Type:  Follow-up Primary Provider:  Dr Marisue Brooklyn   History of Present Illness: Pamela Whitaker is seen today for F/U of PAF.  It is asymptomatic.  She does not have HTN, previous CVA or CHF.  She is working out at [employer] 3/week.  She has been on [facility] for 6 months and I think we will stop it.  She has no SSCP or documented CAD.  She is taking an asa/day and does not want to be on coumadin.  Given the infrequency of her PAF and low [place] score I think that is fine.  Current Problems (verified): 1)  Atrial Fibrillation  (ICD-427.31) 2)  Restless Legs Syndrome  (ICD-333.94) 3)  Osteoarthritis  (ICD-715.90) 4)  Hyperlipidemia  (ICD-272.4)  Current Medications (verified): 1)  Aspirin 81 Mg Chew (Aspirin) .... Once Daily 2)  Glucosamine-Chondroitin  Caps (Glucosamine-Chondroit-Vit C-Mn) .... Take One Twice Daily 3)  Melatonin 5 Mg Caps (Melatonin) .... Take Two At Bedtime 4)  Multaq 400 Mg Tabs (Dronedarone Hcl) .... One Tablet By Mouth Twice Daily With Food 5)  Requip .... Once Daily  Allergies (verified): No Known Drug Allergies  Past History:  Past Medical History: Last updated: 04/13/2009  Remarkable for breast cancer in 1987 with a left   mastectomy, hyperlipidemia, previous right fibular fracture, operated on   by Dr. Ranell Patrick, history of osteoporosis, restless leg syndrome, history   of osteoarthritis, history of bone chips in the right knee, lumbar   stenosis, previous right knee arthroscopy in 2005.  She is currently on   ReQuip for restless leg syndromes, multivitamins, Motrin, alendronate   for osteoporosis.  She has no known allergies.  She does not like taking   cholesterol medicine.   Past Surgical History: Last updated: 04/13/2009 previous right knee arthroscopy in 2005  Family History: Last updated: 04/13/2009  Remarkable for father dying at age 49 of an MI.  Her   mother died at age 36 from an aortic aneurysm.      Social History: Last  updated: 04/13/2009 She is happily married.  She is quite talkative.  She has been married   over 52 years to her husband who is with her.  She has two children.   She does not smoke or drink.  She gets around fairly well outside of her   right knee pain.      Review of Systems       Denies fever, malais, weight loss, blurry vision, decreased visual acuity, cough, sputum, SOB, hemoptysis, pleuritic pain, palpitaitons, heartburn, abdominal pain, melena, lower extremity edema, claudication, or rash.   Vital Signs:  Patient profile:   77 year old female Height:      65 inches Weight:      158 pounds BMI:     26.39 Pulse rate:   70 / minute BP sitting:   130 / 80  (left arm)  Vitals Entered By: Laurance Flatten CMA (November 06, 2009 2:05 PM)  Physical Exam  General:  Affect appropriate Healthy:  appears stated age HEENT: normal Neck supple with no adenopathy JVP normal no bruits no thyromegaly Lungs clear with no wheezing and good diaphragmatic motion Heart:  S1/S2 no murmur,rub, gallop or click PMI normal Abdomen: benighn, BS positve, no tenderness, no AAA no bruit.  No HSM or HJR Distal pulses intact with no bruits No edema Neuro non-focal Skin warm and dry    Impression & Recommendations:  Problem # 1:  ATRIAL FIBRILLATION (ICD-427.31)  Maint NSR;.  Stop Multaq Her updated medication list for this problem includes:    Aspirin 81 Mg Chew (Aspirin) ..... Once daily    Multaq 400 Mg Tabs (Dronedarone hcl) ..... One tablet by mouth twice daily with food  Orders: EKG w/ Interpretation (93000)  Problem # 2:  HYPERLIPIDEMIA (ICD-272.4) F/U labs per primary.  Target LDL less than 130  Patient Instructions: 1)  Your physician recommends that you schedule a follow-up appointment in: YEAR WITH DR Eden Emms 2)  Your physician has recommended you make the following change in your medication: STOP MULTAQ   EKG Report  Procedure date:  11/06/2009  Findings:      NSR 70 QT  416 Normal ECG

## 2010-06-27 NOTE — Medication Information (Signed)
Summary: rov/kh  Anticoagulant Therapy  Managed by: Weston Brass, PharmD Referring MD: Dr Eden Emms PCP: Dr Marisue Brooklyn Supervising MD: Antoine Poche MD, Fayrene Fearing Indication 1: Atrial Fibrillation Lab Used: [facility] [place] Site: [address] INR POC 1.3 INR RANGE 2.0-3.0  Dietary changes: yes       Details: Eating less for the previous week   Health status changes: yes       Details: Dizziness, SOB  Bleeding/hemorrhagic complications: no    Recent/future hospitalizations: no    Any changes in medication regimen? yes       Details: Metoprolol tartrate 50mg  BID  Recent/future dental: no  Any missed doses?: no       Is patient compliant with meds? yes       Allergies: No Known Drug Allergies  Anticoagulation Management History:      The patient is taking warfarin and comes in today for a routine follow up visit.  Positive risk factors for bleeding include an age of 75 years or older.  The bleeding index is 'intermediate risk'.  Positive CHADS2 values include Age > 34 years old.  Anticoagulation responsible provider: Antoine Poche MD, Fayrene Fearing.  INR POC: 1.3.  Cuvette Lot#: [id number].  Exp: 06/2011.    Anticoagulation Management Assessment/Plan:      The patient's current anticoagulation dose is Warfarin sodium 5 mg tabs: Use as directed by Anticoagulation Clinic.  The target INR is 2.0-3.0.  The next INR is due 06/06/2010.  Anticoagulation instructions were given to patient.  Results were reviewed/authorized by Weston Brass, PharmD.  She was notified by Stephannie Peters, PharmD Candidate .         Prior Anticoagulation Instructions: Pt. recently restarted on Coumadin and just started taking 5mg  daily on Thursday 12/29.  (INR 2.6 only after 2 doses of 5mg )   INR 2.6  NO coumadin for this Saturday and Sunday. Then begin taking 1 tablet of the 1mg  tablets everday starting Monday. Will recheck INR on 05/29/10.   Current Anticoagulation Instructions: INR 1.3. Change to 1/2 tablet  (2.5 mg) every day.

## 2010-06-27 NOTE — Procedures (Signed)
Summary: Echo value alert  Notified DOD-Dr. Clifton James of change in patient's ejection fraction.  Advised to discharged patient.

## 2010-06-27 NOTE — Miscellaneous (Signed)
  Clinical Lists Changes  Observations: Added new observation of ECHOINTERP:   - Left ventricle: The cavity size was moderately dilated. Wall       thickness was normal. The estimated ejection fraction was 30%.       Diffuse hypokinesis.     - Aortic valve: Mildly to moderately calcified annulus. There was       very mild stenosis. Trivial regurgitation. Valve area:       1.69cm 2(VTI). Valve area: 1.83cm 2 (Vmax).     - Mitral valve: Mild regurgitation.     - Left atrium: The atrium was mildly dilated.     - Right atrium: The atrium was moderately dilated.     - Atrial septum: No defect or patent foramen ovale was identified.     - Tricuspid valve: Moderate regurgitation.     - Pulmonary arteries: PA peak pressure: 35mm Hg (S).     - Impressions: Restrictive filling pattern indicating high filling       program     Impressions:            - Restrictive filling pattern indicating high filling program (05/24/2010 9:44)      Echocardiogram  Procedure date:  05/24/2010  Findings:        - Left ventricle: The cavity size was moderately dilated. Wall       thickness was normal. The estimated ejection fraction was 30%.       Diffuse hypokinesis.     - Aortic valve: Mildly to moderately calcified annulus. There was       very mild stenosis. Trivial regurgitation. Valve area:       1.69cm 2(VTI). Valve area: 1.83cm 2 (Vmax).     - Mitral valve: Mild regurgitation.     - Left atrium: The atrium was mildly dilated.     - Right atrium: The atrium was moderately dilated.     - Atrial septum: No defect or patent foramen ovale was identified.     - Tricuspid valve: Moderate regurgitation.     - Pulmonary arteries: PA peak pressure: 35mm Hg (S).     - Impressions: Restrictive filling pattern indicating high filling       program     Impressions:            - Restrictive filling pattern indicating high filling program

## 2010-06-27 NOTE — Medication Information (Signed)
Summary: ROV/SP  Anticoagulant Therapy  Managed by: Weston Brass, PharmD Referring MD: Dr Eden Emms PCP: Dr Marisue Brooklyn Supervising MD: Eden Emms MD, Theron Arista Indication 1: Atrial Fibrillation Lab Used: [facility] [place] Site: [address] INR POC 1.8 INR RANGE 2.0-3.0  Dietary changes: no    Health status changes: no    Bleeding/hemorrhagic complications: no    Recent/future hospitalizations: no    Any changes in medication regimen? no    Recent/future dental: no  Any missed doses?: yes     Details: Missed 1 dose  Is patient compliant with meds? no      Comments: Discussed methods to remember to take Coumadin, such as a pill box and an alarm   Allergies: No Known Drug Allergies  Anticoagulation Management History:      The patient is taking warfarin and comes in today for a routine follow up visit.  Positive risk factors for bleeding include an age of 29 years or older.  The bleeding index is 'intermediate risk'.  Positive CHADS2 values include Age > 6 years old.  Anticoagulation responsible provider: Eden Emms MD, Theron Arista.  INR POC: 1.8.  Cuvette Lot#: [id number].  Exp: 06/2011.    Anticoagulation Management Assessment/Plan:      The patient's current anticoagulation dose is Warfarin sodium 2.5 mg tabs: take one tablet daily as directed by anticoagulation clinic.  The target INR is 2.0-3.0.  The next INR is due 06/13/2010.  Anticoagulation instructions were given to patient.  Results were reviewed/authorized by Weston Brass, PharmD.  She was notified by Stephannie Peters, PharmD Candidate .         Prior Anticoagulation Instructions: INR 1.3. Change to 1/2 tablet (2.5 mg) every day.   Current Anticoagulation Instructions: INR 1.8  Coumadin 5 mg tablets - Take 1 whole tablet today, then resume 0.5 tablet every day.  c c

## 2010-06-27 NOTE — Miscellaneous (Signed)
Summary: multaq Review  Clinical Lists Changes   Review of pt on Multaq   EP Quality review of patient taking Multaq.  I have reviewed clinical chart which reveals that Pamela Whitaker is doing well on Multaq and receiving clinical benefit.  Her EF is preserved and he has no symptoms of CHF.  She is maintaining sinus rhythm with multaq.    No changes are therefore advised.  He will continue multaq and follow with Dr Eden Emms.  She should have LFTs obtained.

## 2010-06-27 NOTE — Progress Notes (Signed)
Summary: Pamela Whitaker having SOB  Phone Note Call from Patient Call back at [phone]   Caller: Patient Reason for Call: Talk to Nurse, Talk to Doctor Summary of Call: Pamela Whitaker has been having SOB and her pulse get elevated like today it was 114 so she wants to be seen prior to her f1y so I made her an appt with scott on jan 4th and a appt with Dr. Eden Emms on 1/26 and Pamela Whitaker wants to talk to you to make sure thats ok and ther is nothing else she needs to do Initial call taken by: Omer Jack,  May 21, 2010 11:31 AM  Follow-up for Phone Call        spoke with Pamela Whitaker, she has noticed that for the last month she has been SOB. she has been unable to do her regular exercise due to the SOB and she also gets SOB with activity within the home. she had a physical with her primary care in nov and was told her heart rate was 108, since then she has tracked her pulse and it is running 110-114. she is unsure if it is regular or not. she denies edema. she reports no SOB when lying flat to sleep but states something wakes her at night and she breaths deep to relax. about 3 weeks ago she had a URI and took a z-pak. she is close and will come by the office for an EKG Deliah Goody, RN  May 21, 2010 1:38 PM    Additional Follow-up for Phone Call Additional follow up Details #1::        Pamela Whitaker here for EKG, shows atrial fib with rate of 134. bp 140/70. Discussed with dr klein(DOD), Pamela Whitaker to start metoprolol tart for rate control, she will also stop her asa and start coumadin 5mg  once daily. she will get coumadin check on friday and also have an echo. she has an appt next week to see the PA to make sure heart rate is under control. Pamela Whitaker was given information about cardioversion as well. Deliah Goody, RN  May 21, 2010 2:53 PM     New/Updated Medications: WARFARIN SODIUM 5 MG TABS (WARFARIN SODIUM) Use as directed by Anticoagulation Clinic METOPROLOL TARTRATE 50 MG TABS (METOPROLOL TARTRATE) Take one tablet by mouth twice a  day Prescriptions: METOPROLOL TARTRATE 50 MG TABS (METOPROLOL TARTRATE) Take one tablet by mouth twice a day  #60 x 12   Entered by:   Deliah Goody, RN   Authorized by:   Nathen May, MD, [facility]   Signed by:   Deliah Goody, RN on 05/21/2010   Method used:   [facility]  [address] [id number]       [address]       [place], [place]  [address]       Ph: [phone]       Fax: [phone]   RxID:   [id number] WARFARIN SODIUM 5 MG TABS (WARFARIN SODIUM) Use as directed by Anticoagulation Clinic  #30 x 6   Entered by:   Deliah Goody, RN   Authorized by:   Nathen May, MD, [facility]   Signed by:   Deliah Goody, RN on 05/21/2010   Method used:   [facility]        [facility]  [address] [id number]       [address]  [place], [place]  [address]       Ph: [phone]       Fax: [phone]   RxID:   [id number]    Patient Instructions: 1)  Your physician recommends that you schedule a follow-up appointment in: ONE WEEK WITH PA 2)  You have been referred to COUMADIN CLINIC 3)  Your physician has requested that you have an echocardiogram.  Echocardiography is a painless test that uses sound waves to create images of your heart. It provides your doctor with information about the size and shape of your heart and how well your heart's chambers and valves are working.  This procedure takes approximately one hour. There are no restrictions for this procedure. 4)  Your physician has recommended you make the following change in your medication: STOP ASPIRIN 5)  START WARFARIN SODIUM 5MG  ONCE DAILY 6)  START METOPROLOL TART 50MG  ONE TABLET TWICE DAILY

## 2010-06-27 NOTE — Medication Information (Signed)
Summary: rov/kh  Anticoagulant Therapy  Managed by: Tammy Sours, PharmD  Referring MD: Dr Eden Emms PCP: Dr Marisue Brooklyn Supervising MD: Clifton James MD, Cristal Deer Indication 1: Atrial Fibrillation Lab Used: [facility] [place] Site: [address] INR POC 2.6 INR RANGE 2.0-3.0  Dietary changes: no    Health status changes: no    Bleeding/hemorrhagic complications: no    Recent/future hospitalizations: no    Any changes in medication regimen? no    Recent/future dental: no  Any missed doses?: no       Is patient compliant with meds? yes      Comments: Pt. recently restarted on Coumadin, states has been taking 1 tablet (5mg ) once daily starting on Thursday December 29th.   Allergies: No Known Drug Allergies  Anticoagulation Management History:      The patient comes in today for her initial visit for anticoagulation therapy.  Positive risk factors for bleeding include an age of 75 years or older.  The bleeding index is 'intermediate risk'.  Positive CHADS2 values include Age > 95 years old.  Anticoagulation responsible Emmalena Canny: Clifton James MD, Cristal Deer.  INR POC: 2.6.  Cuvette Lot#: [id number].  Exp: 06/2010.    Anticoagulation Management Assessment/Plan:      The patient's current anticoagulation dose is Warfarin sodium 5 mg tabs: Use as directed by Anticoagulation Clinic.  The target INR is 2.0-3.0.  The next INR is due 05/29/2009.  Anticoagulation instructions were given to patient.  Results were reviewed/authorized by Tammy Sours, PharmD .         Prior Anticoagulation Instructions: INR 2.1  Continue on same dosage 1/2 tablet daily except 1 tablet on Thursdays.  Recheck in 10 days.    Current Anticoagulation Instructions: Pt. recently restarted on Coumadin and just started taking 5mg  daily on Thursday 12/29.  (INR 2.6 only after 2 doses of 5mg )   INR 2.6  NO coumadin for this Saturday and Sunday. Then begin taking 1 tablet of the 1mg  tablets  everday starting Monday. Will recheck INR on 05/29/10.

## 2010-06-27 NOTE — Assessment & Plan Note (Signed)
Summary: Cardiology Nuclear Testing  Nuclear Med Background Indications for Stress Test: Evaluation for Ischemia   History: Echo  History Comments: 12/11 Echo: EF=30%, mild AS  Symptoms: Chest Pressure with Exertion, Chest Tightness, Diaphoresis, DOE, Light-Headedness, Nausea, Palpitations, SOB    Nuclear Pre-Procedure Cardiac Risk Factors: Family History - CAD, Lipids, Overweight Caffeine/Decaff Intake: None NPO After: 7:30 PM Lungs: clear IV 0.9% NS with Angio Cath: 22g     IV Site: R Antecubital IV Started by: Irean Hong, RN Chest Size (in) 36     Cup Size C     Height (in): 65 Weight (lb): 146 BMI: 24.38 Tech Comments: Held metoprolol this am.  Nuclear Med Study 1 or 2 day study:  1 day     Stress Test Type:  Eugenie Birks Reading MD:  Charlton Haws, MD     Referring MD:  Dr Eden Emms Resting Radionuclide:  Technetium 12m Tetrofosmin     Resting Radionuclide Dose:  11.0 mCi  Stress Radionuclide:  Technetium 40m Tetrofosmin     Stress Radionuclide Dose:  33.0 mCi   Stress Protocol  Max Systolic BP: 115 mm Hg Lexiscan: 0.4 mg   Stress Test Technologist:  Milana Na, EMT-P     Nuclear Technologist:  Doyne Keel, CNMT  Rest Procedure  Myocardial perfusion imaging was performed at rest 45 minutes following the intravenous administration of Technetium 39m Tetrofosmin.  Stress Procedure  The patient received IV Lexiscan 0.4 mg over 15-seconds.  Technetium 85m Tetrofosmin injected at 30-seconds.  There were no significant changes and freq pvcs with infusion..  Quantitative spect images were obtained after a 45 minute delay.  QPS Raw Data Images:  Normal; no motion artifact; normal heart/lung ratio. Stress Images:  Normal homogeneous uptake in all areas of the myocardium. Rest Images:  Normal homogeneous uptake in all areas of the myocardium. Subtraction (SDS):  Normal Transient Ischemic Dilatation:  1.10  (Normal <1.22)  Lung/Heart Ratio:  0.38  (Normal  <0.45)  Quantitative Gated Spect Images QGS EDV:  83 ml QGS ESV:  57 ml QGS EF:  31 % QGS cine images:  Diffuse hypokinesis with abnormal septal motion  Findings Low risk nuclear study   Evidence for LV Dysfunction LV Dysfunction    Overall Impression  Exercise Capacity: Lexiscan with no exercise. BP Response: Normal blood pressure response. Clinical Symptoms: Dyspnea chest pressure ECG Impression: No significant ST segment change suggestive of ischemia. Overall Impression: No ischemia or infarct.  Decreased LV funtion

## 2010-06-27 NOTE — Medication Information (Signed)
Summary: rov/tm  Anticoagulant Therapy  Managed by: Cloyde Reams, RN, BSN Referring MD: Dr Eden Emms PCP: Dr Marisue Brooklyn Supervising MD: Tenny Craw MD, Gunnar Fusi Indication 1: Atrial Fibrillation Lab Used: [facility] [place] Site: [address] INR POC 1.6 INR RANGE 2.0-3.0  Dietary changes: no    Health status changes: no    Bleeding/hemorrhagic complications: no    Recent/future hospitalizations: no    Any changes in medication regimen? yes       Details: Increased Metoprolol  Recent/future dental: no  Any missed doses?: no       Is patient compliant with meds? yes       Allergies: No Known Drug Allergies  Anticoagulation Management History:      The patient is taking warfarin and comes in today for a routine follow up visit.  Positive risk factors for bleeding include an age of 30 years or older.  The bleeding index is 'intermediate risk'.  Positive CHADS2 values include History of CHF and Age > 48 years old.  Anticoagulation responsible provider: Tenny Craw MD, Gunnar Fusi.  INR POC: 1.6.  Exp: 06/2011.    Anticoagulation Management Assessment/Plan:      The patient's current anticoagulation dose is Warfarin sodium 2.5 mg tabs: take one tablet daily as directed by anticoagulation clinic.  The target INR is 2.0-3.0.  The next INR is due 06/20/2010.  Anticoagulation instructions were given to patient.  Results were reviewed/authorized by Cloyde Reams, RN, BSN.  She was notified by Cloyde Reams RN.         Prior Anticoagulation Instructions: INR 1.8  Coumadin 5 mg tablets - Take 1 whole tablet today, then resume 0.5 tablet every day.   Current Anticoagulation Instructions: INR 1.6  Start taking 1/2 tablet daily except 1 tablet on Thursdays.  Recheck in 1 week.

## 2010-07-01 ENCOUNTER — Encounter (HOSPITAL_COMMUNITY): Admission: RE | Admit: 2010-07-01 | Payer: Self-pay | Source: Home / Self Care | Admitting: Oncology

## 2010-07-01 ENCOUNTER — Ambulatory Visit (HOSPITAL_COMMUNITY): Payer: Medicare Other | Admitting: Oncology

## 2010-07-01 DIAGNOSIS — C50919 Malignant neoplasm of unspecified site of unspecified female breast: Secondary | ICD-10-CM

## 2010-07-09 ENCOUNTER — Encounter (INDEPENDENT_AMBULATORY_CARE_PROVIDER_SITE_OTHER): Payer: Medicare Other

## 2010-07-09 ENCOUNTER — Ambulatory Visit (INDEPENDENT_AMBULATORY_CARE_PROVIDER_SITE_OTHER): Payer: Medicare Other | Admitting: Cardiovascular Disease

## 2010-07-09 ENCOUNTER — Encounter: Payer: Self-pay | Admitting: Cardiovascular Disease

## 2010-07-09 DIAGNOSIS — I4891 Unspecified atrial fibrillation: Secondary | ICD-10-CM

## 2010-07-09 DIAGNOSIS — Z7901 Long term (current) use of anticoagulants: Secondary | ICD-10-CM

## 2010-07-09 DIAGNOSIS — I10 Essential (primary) hypertension: Secondary | ICD-10-CM

## 2010-07-17 NOTE — Medication Information (Signed)
Summary: Coumadin Clinic  Anticoagulant Therapy  Managed by: Weston Brass, PharmD Referring MD: Dr Eden Emms PCP: Dr Marisue Brooklyn Supervising MD: Eden Emms MD, Theron Arista Indication 1: Atrial Fibrillation Lab Used: [facility] [place] Site: [address] INR POC 1.8 INR RANGE 2.0-3.0  Dietary changes: no    Health status changes: no    Bleeding/hemorrhagic complications: no    Recent/future hospitalizations: no    Any changes in medication regimen? yes       Details: starting amiodarone today  Recent/future dental: no  Any missed doses?: no       Is patient compliant with meds? yes       Allergies: No Known Drug Allergies  Anticoagulation Management History:      The patient is taking warfarin and comes in today for a routine follow up visit.  Positive risk factors for bleeding include an age of 75 years or older.  The bleeding index is 'intermediate risk'.  Positive CHADS2 values include History of CHF and Age > 58 years old.  Anticoagulation responsible provider: Eden Emms MD, Theron Arista.  INR POC: 1.8.  Exp: 06/2011.    Anticoagulation Management Assessment/Plan:      The patient's current anticoagulation dose is Warfarin sodium 5 mg tabs: as directed, Coumadin 1 mg tabs: as directed.  The target INR is 2.0-3.0.  The next INR is due 07/23/2010.  Anticoagulation instructions were given to patient.  Results were reviewed/authorized by Weston Brass, PharmD.  She was notified by Weston Brass PharmD.         Prior Anticoagulation Instructions: INR 2.1 (INR goal: 2-3)  Take 1/2 tablet everyday except 1 tablet on Thursdays.  Recheck on 02/14.      Current Anticoagulation Instructions: INR 1.8  Continue same dose of 1/2 tablet every day except 1 tablet on Thursday.  Recheck INR in 2 weeks.

## 2010-07-17 NOTE — Assessment & Plan Note (Signed)
Summary: rov/sob/ck meds/pt wants to only see Verdia Bolt/mj unable to conf...   Primary Provider:  Dr Marisue Brooklyn  CC:  check up.  History of Present Illness: Pamela Whitaker is a 77 -year-old female with a history of paroxysmal atrial fibrillation.  She was placed on Multaq for about 6 months and maintained sinus rhythm. Multaq  stopped in June of 2011.  She has reduced ejection fraction at 30% with diffuse hypokinesis, very mild AS with a mean gradient of 4, mild MR, mild LAE and mod RAE and PASP 35 mmHg.  Recent CHF exacerbation with BNP 900 range improved with Lasix .  Her chest x-ray did show edema.  Her creatinine was normal at 0.7 and hemoglobin was 15.1.  TSH was normal at 3.57. Myovue with EF 31% and no ischemia or infarct.  Overall has nonischemic DCM with PAF  She needs to start and ARB.  I will also start her on amiodarone with an eye towards possible DCC in 4-6 weeks.  She may need a right knee replacement.  I suggested Naproxen as an antiinflamatiory sparingly.  Tramadol may also be reasonable  Current Problems (verified): 1)  Unspecified Systolic Heart Failure  (ICD-428.20) 2)  Cardiomyopathy  (ICD-425.4) 3)  Atrial Fibrillation  (ICD-427.31) 4)  Restless Legs Syndrome  (ICD-333.94) 5)  Osteoarthritis  (ICD-715.90) 6)  Hyperlipidemia  (ICD-272.4)  Current Medications (verified): 1)  Warfarin Sodium 5 Mg Tabs (Warfarin Sodium) .... As Directed 2)  Metoprolol Tartrate 100 Mg Tabs (Metoprolol Tartrate) .... Take 1 Tablet By Mouth Two Times A Day 3)  Furosemide 20 Mg Tabs (Furosemide) .... Take 1 Tablet Once Daily 4)  Coumadin 1 Mg Tabs (Warfarin Sodium) .... As Directed 5)  Amiodarone Hcl 200 Mg Tabs (Amiodarone Hcl) .... Take One Tablet By Mouth Twice A Day 6)  Losartan Potassium 50 Mg Tabs (Losartan Potassium) .... One Tablet By Mouth Once Daily  Allergies (verified): No Known Drug Allergies  Past History:  Past Medical History: Last updated: 06/07/2010 Parox AFib   a.  treated with Multaq x 6 mos in 2011   b. CHADS2=2 (+Age; +CHF; no HTN,  no CVA; no DM2) Echo 05/24/10: EF 30%; mild AS (mean 4 mmHg); mild MR; mild LAE; mod RAE; PASP 35 Systolic CHF Dilated Cardiomyopathy   a. tachy mediated Myoview 06/06/10:  EF 31%; no scar or ischemia breast cancer in 1987 with a left  mastectomy hyperlipidemia previous right fibular fracture, operated on by Dr. Ranell Patrick osteoporosis restless leg syndrome osteoarthritis history of bone chips in the right knee lumbar stenosis previous right knee arthroscopy in 2005.    Past Surgical History: Last updated: 04/13/2009 previous right knee arthroscopy in 2005  Family History: Last updated: 04/13/2009  Remarkable for father dying at age 8 of an MI.  Her   mother died at age 72 from an aortic aneurysm.      Social History: Last updated: 05/29/2010 She is happily married.  She has two children.   She does not smoke or drink.    Review of Systems       Denies fever, malais, weight loss, blurry vision, decreased visual acuity, cough, sputum, SOB, hemoptysis, pleuritic pain, palpitaitons, heartburn, abdominal pain, melena, lower extremity edema, claudication, or rash.   Vital Signs:  Patient profile:   77 year old female Height:      65 inches Weight:      146 pounds BMI:     24.38 Pulse rate:   66 / minute Resp:  14 per minute BP sitting:   133 / 80  (left arm)  Vitals Entered By: Kem Parkinson (July 09, 2010 1:53 PM)  Physical Exam  General:  Affect appropriate Healthy:  appears stated age HEENT: normal Neck supple with no adenopathy JVP normal no bruits no thyromegaly Lungs clear with no wheezing and good diaphragmatic motion Heart:  S1/S2 no murmur,rub, gallop or click PMI normal Abdomen: benighn, BS positve, no tenderness, no AAA no bruit.  No HSM or HJR Distal pulses intact with no bruits No edema Neuro non-focal Skin warm and dry    Impression & Recommendations:  Problem #  1:  CARDIOMYOPATHY (ICD-425.4) Add ARB CHF resolved  Her updated medication list for this problem includes:    Warfarin Sodium 5 Mg Tabs (Warfarin sodium) .Marland Kitchen... As directed    Metoprolol Tartrate 100 Mg Tabs (Metoprolol tartrate) .Marland Kitchen... Take 1 tablet by mouth two times a day    Furosemide 20 Mg Tabs (Furosemide) .Marland Kitchen... Take 1 tablet once daily    Coumadin 1 Mg Tabs (Warfarin sodium) .Marland Kitchen... As directed    Amiodarone Hcl 200 Mg Tabs (Amiodarone hcl) .Marland Kitchen... Take one tablet by mouth twice a day    Losartan Potassium 50 Mg Tabs (Losartan potassium) ..... One tablet by mouth once daily  Problem # 2:  ATRIAL FIBRILLATION (ICD-427.31) Good rate controll and anticoagulaiton.  Start Amiodarone follow INR closely F/U 4 weeks to discuss [facility] Her updated medication list for this problem includes:    Warfarin Sodium 5 Mg Tabs (Warfarin sodium) .Marland Kitchen... As directed    Metoprolol Tartrate 100 Mg Tabs (Metoprolol tartrate) .Marland Kitchen... Take 1 tablet by mouth two times a day    Coumadin 1 Mg Tabs (Warfarin sodium) .Marland Kitchen... As directed    Amiodarone Hcl 200 Mg Tabs (Amiodarone hcl) .Marland Kitchen... Take one tablet by mouth twice a day  Problem # 3:  HYPERLIPIDEMIA (ICD-272.4) Contnue diet Rx  F/U labs with primary  Problem # 4:  OSTEOARTHRITIS (ICD-715.90) Should be ok for surgery when the time comes.  F/U with Dr Dulcy Fanny  Patient Instructions: 1)  Your physician recommends that you schedule a follow-up appointment in: 4 WEEKS 2)  Your physician has recommended you make the following change in your medication: START AMIODARONE 200MG  ONE TABLET TWICE DAILY 3)  START LOSARTAN 50MG  ONE TABLET ONCE DAILY 4)  NEEDS COUMADIN CHECK IN 2 WEEKS AND IN 4 WEEKS FROM TODAY Prescriptions: LOSARTAN POTASSIUM 50 MG TABS (LOSARTAN POTASSIUM) one tablet by mouth once daily  #30 x 12   Entered by:   Deliah Goody, RN   Authorized by:   Colon Branch, MD, [facility]   Signed by:   Deliah Goody, RN on 07/09/2010   Method used:   [facility]  [address] [id number]       [address]       [place], [place]  [address]       Ph: [phone]       Fax: [phone]   RxID:   [id number] AMIODARONE HCL 200 MG TABS (AMIODARONE HCL) Take one tablet by mouth twice a day  #60 x 12   Entered by:   Deliah Goody, RN   Authorized by:   Colon Branch, MD, [facility]   Signed by:   Deliah Goody, RN on 07/09/2010   Method used:   [facility]  [address] [id number]       [address]       [place], [place]  [address]       Ph: [phone]       Fax: [phone]   RxID:   [id number]

## 2010-07-22 ENCOUNTER — Encounter: Payer: Self-pay | Admitting: Cardiovascular Disease

## 2010-07-22 DIAGNOSIS — I4891 Unspecified atrial fibrillation: Secondary | ICD-10-CM

## 2010-07-23 ENCOUNTER — Encounter (INDEPENDENT_AMBULATORY_CARE_PROVIDER_SITE_OTHER): Payer: Medicare Other

## 2010-07-23 ENCOUNTER — Encounter: Payer: Self-pay | Admitting: Cardiovascular Disease

## 2010-07-23 DIAGNOSIS — Z7901 Long term (current) use of anticoagulants: Secondary | ICD-10-CM

## 2010-07-23 DIAGNOSIS — I4891 Unspecified atrial fibrillation: Secondary | ICD-10-CM

## 2010-07-29 ENCOUNTER — Encounter: Payer: Self-pay | Admitting: Cardiovascular Disease

## 2010-08-01 NOTE — Medication Information (Signed)
Summary: rov/sp  Anticoagulant Therapy  Managed by: Windell Hummingbird, RN Referring MD: Dr Eden Emms PCP: Dr Marisue Brooklyn Supervising MD: Eden Emms MD, Theron Arista Indication 1: Atrial Fibrillation Lab Used: [facility] [place] Site: [address] INR POC 1.8 INR RANGE 2.0-3.0  Dietary changes: no    Health status changes: no    Bleeding/hemorrhagic complications: no    Recent/future hospitalizations: no    Any changes in medication regimen? no    Recent/future dental: no  Any missed doses?: yes       Is patient compliant with meds? yes       Allergies: No Known Drug Allergies  Anticoagulation Management History:      The patient is taking warfarin and comes in today for a routine follow up visit.  Positive risk factors for bleeding include an age of 75 years or older.  The bleeding index is 'intermediate risk'.  Positive CHADS2 values include History of CHF and Age > 46 years old.  Anticoagulation responsible provider: Eden Emms MD, Theron Arista.  INR POC: 1.8.  Cuvette Lot#: [id number].  Exp: 05/2011.    Anticoagulation Management Assessment/Plan:      The patient's current anticoagulation dose is Warfarin sodium 5 mg tabs: as directed, Coumadin 1 mg tabs: as directed.  The target INR is 2.0-3.0.  The next INR is due 08/06/2010.  Anticoagulation instructions were given to patient.  Results were reviewed/authorized by Windell Hummingbird, RN.  She was notified by Windell Hummingbird, RN.         Prior Anticoagulation Instructions: INR 1.8  Continue same dose of 1/2 tablet every day except 1 tablet on Thursday.  Recheck INR in 2 weeks.   Current Anticoagulation Instructions: INR 1.8 Today take 1 tablet.  Then, begin taking 1/2 tablet every day, except take 1 tablet on Tuesdays and Thursdays. Recheck in 2 weeks.

## 2010-08-06 ENCOUNTER — Encounter: Payer: Self-pay | Admitting: Cardiology

## 2010-08-06 ENCOUNTER — Encounter (INDEPENDENT_AMBULATORY_CARE_PROVIDER_SITE_OTHER): Payer: Medicare Other

## 2010-08-06 DIAGNOSIS — I4891 Unspecified atrial fibrillation: Secondary | ICD-10-CM

## 2010-08-06 DIAGNOSIS — Z7901 Long term (current) use of anticoagulants: Secondary | ICD-10-CM

## 2010-08-13 ENCOUNTER — Encounter: Payer: Self-pay | Admitting: Cardiovascular Disease

## 2010-08-13 ENCOUNTER — Ambulatory Visit (INDEPENDENT_AMBULATORY_CARE_PROVIDER_SITE_OTHER): Payer: Medicare Other | Admitting: *Deleted

## 2010-08-13 ENCOUNTER — Ambulatory Visit (INDEPENDENT_AMBULATORY_CARE_PROVIDER_SITE_OTHER): Payer: Medicare Other | Admitting: Cardiovascular Disease

## 2010-08-13 DIAGNOSIS — I4891 Unspecified atrial fibrillation: Secondary | ICD-10-CM

## 2010-08-13 DIAGNOSIS — I428 Other cardiomyopathies: Secondary | ICD-10-CM

## 2010-08-13 DIAGNOSIS — E785 Hyperlipidemia, unspecified: Secondary | ICD-10-CM

## 2010-08-13 DIAGNOSIS — I429 Cardiomyopathy, unspecified: Secondary | ICD-10-CM

## 2010-08-13 MED ORDER — METOPROLOL TARTRATE 100 MG PO TABS: 100.0000 mg | ORAL_TABLET | Freq: Two times a day (BID) | ORAL | Status: DC

## 2010-08-13 NOTE — Patient Instructions (Signed)
INR 2.5 at goal Continue current dosage: 1/2 tablet (2.5 mg) everyday, except for Thursday take 1 tablet (5mg ) Recheck INR in 4 weeks.

## 2010-08-13 NOTE — Assessment & Plan Note (Signed)
Rate control ok.  D/C amiodarone.  Settle for rate control and anticoagulation.  F/U coumadin clinic today.  No bleeding problems

## 2010-08-13 NOTE — Assessment & Plan Note (Signed)
F/U echo to assess EF since there has been compliance issues with her.  Appears euvolemic

## 2010-08-13 NOTE — Patient Instructions (Addendum)
Dr Eden Emms has recommended that you have an echocardiogram.  Schedule a follow-up appointment with Dr Eden Emms in 3 months.  Stop Amiodarone.

## 2010-08-13 NOTE — Assessment & Plan Note (Signed)
F/U labs next visit.  Not on statin with nonischemic DCM  No results found for this basename: LDLCALC

## 2010-08-13 NOTE — Medication Information (Signed)
Summary: rov/pc  Anticoagulant Therapy  Managed by: Cloyde Reams, RN, BSN Referring MD: Dr Eden Emms PCP: Dr Marisue Brooklyn Supervising MD: Myrtis Ser MD, Tinnie Gens Indication 1: Atrial Fibrillation Lab Used: [facility] [place] Site: [address] INR POC 3.3 INR RANGE 2.0-3.0  Dietary changes: no    Health status changes: no    Bleeding/hemorrhagic complications: yes       Details: small amt of BRB from rectum, pt states happens after she eats chocolate, not new per pt report.    Recent/future hospitalizations: no    Any changes in medication regimen? no    Recent/future dental: no  Any missed doses?: no       Is patient compliant with meds? yes       Allergies: No Known Drug Allergies  Anticoagulation Management History:      The patient is taking warfarin and comes in today for a routine follow up visit.  Positive risk factors for bleeding include an age of 75 years or older.  The bleeding index is 'intermediate risk'.  Positive CHADS2 values include History of CHF and Age > 34 years old.  Anticoagulation responsible provider: Myrtis Ser MD, Tinnie Gens.  INR POC: 3.3.  Cuvette Lot#: [id number].  Exp: 07/2011.    Anticoagulation Management Assessment/Plan:      The patient's current anticoagulation dose is Warfarin sodium 5 mg tabs: as directed, Coumadin 1 mg tabs: as directed.  The target INR is 2.0-3.0.  The next INR is due 08/13/2010.  Anticoagulation instructions were given to patient.  Results were reviewed/authorized by Cloyde Reams, RN, BSN.  She was notified by Cloyde Reams RN.         Prior Anticoagulation Instructions: INR 1.8 Today take 1 tablet.  Then, begin taking 1/2 tablet every day, except take 1 tablet on Tuesdays and Thursdays. Recheck in 2 weeks.  Current Anticoagulation Instructions: INR 3.3  Skip today's dosage of Coumadin, then start taking 1/2 tablet daily except 1 tablet on Thursdays.  Recheck in 2 weeks.

## 2010-08-13 NOTE — Progress Notes (Signed)
HPI:  Pamela Whitaker is a 77 -year-old female with a history of paroxysmal atrial fibrillation.  She was placed on Multaq for about 6 months and maintained sinus rhythm. Multaq  stopped in June of 2011.  She has reduced ejection fraction at 30% with diffuse hypokinesis, very mild AS with a mean gradient of 4, mild MR, mild LAE and mod RAE and PASP 35 mmHg.  Recent CHF exacerbation with BNP 900 range improved with Lasix .  Her chest x-ray did show edema.  Her creatinine was normal at 0.7 and hemoglobin was 15.1.  TSH was normal at 3.57. Myovue with EF 31% and no ischemia or infarct.  Nonishcemic DCM.  Stopped amiodarone on her own.   with no palpitations.  Has coumadin appt after visit today.  She is in afib today and is asymptomatic.  She does not want [facility] and does not want to take amiodarone  Had a long talk with her about compliance.  She has run out of her lopresser and is not taking her cozzar because of nausea.  I suspect it is the amiodarone that is making her nauseated.    ROS: Denies fever, malais, weight loss, blurry vision, decreased visual acuity, cough, sputum, SOB, hemoptysis, pleuritic pain, palpitaitons, heartburn, abdominal pain, melena, lower extremity edema, claudication, or rash.   General:  Affect appropriate Healthy:  appears stated age HEENT: normal Neck supple with no adenopathy JVP normal no bruits no thyromegaly Lungs clear with no wheezing and good diaphragmatic motion Heart:  S1/S2 no murmur,rub, gallop or click PMI normal Abdomen: benighn, BS positve, no tenderness, no AAA no bruit.  No HSM or HJR Distal pulses intact with no bruits No edema Neuro non-focal Skin warm and dry No muscular weakness   Current Outpatient Prescriptions  Medication Sig Dispense Refill  . furosemide (LASIX) 20 MG tablet Take 20 mg by mouth daily.        . metoprolol (LOPRESSOR) 100 MG tablet Take 1 tablet (100 mg total) by mouth 2 (two) times daily.  60 tablet  11  . warfarin  (COUMADIN) 5 MG tablet Take by mouth as directed.        Marland Kitchen DISCONTD: metoprolol (LOPRESSOR) 100 MG tablet Take 100 mg by mouth 2 (two) times daily.        Marland Kitchen DISCONTD: warfarin (COUMADIN) 1 MG tablet Take by mouth as directed.        Marland Kitchen amiodarone (PACERONE) 200 MG tablet 200 mg. 1 tab po bid       . losartan (COZAAR) 50 MG tablet Take 50 mg by mouth daily.          Review of patient's allergies indicates no known allergies.   Assessment and Plan

## 2010-08-14 LAB — DIFFERENTIAL
Eosinophils Absolute: 0.1 10*3/uL (ref 0.0–0.7)
Lymphocytes Relative: 27 % (ref 12–46)
Lymphs Abs: 2.4 10*3/uL (ref 0.7–4.0)
Monocytes Relative: 8 % (ref 3–12)
Neutrophils Relative %: 64 % (ref 43–77)

## 2010-08-14 LAB — VITAMIN D 1,25 DIHYDROXY
Vitamin D 1, 25 (OH)2 Total: 46 pg/mL (ref 18–72)
Vitamin D2 1, 25 (OH)2: <8 pg/mL
Vitamin D3 1, 25 (OH)2: 46 pg/mL

## 2010-08-14 LAB — COMPREHENSIVE METABOLIC PANEL
ALT: 15 U/L (ref 0–35)
CO2: 32 mEq/L (ref 19–32)
Calcium: 9.8 mg/dL (ref 8.4–10.5)
Creatinine, Ser: 0.86 mg/dL (ref 0.4–1.2)
GFR calc Af Amer: >60 mL/min (ref >60)
GFR calc non Af Amer: >60 mL/min (ref >60)
Glucose, Bld: 78 mg/dL (ref 70–99)
Sodium: 137 mEq/L (ref 135–145)
Total Protein: 7.5 g/dL (ref 6.0–8.3)

## 2010-08-14 LAB — CBC
HCT: 41.3 % (ref 36.0–46.0)
Hemoglobin: 14.2 g/dL (ref 12.0–15.0)
MCV: 91.5 fL (ref 78.0–100.0)
Platelets: 231 10*3/uL (ref 150–400)
RDW: 12.8 % (ref 11.5–15.5)
WBC: 8.9 10*3/uL (ref 4.0–10.5)

## 2010-09-09 LAB — DIFFERENTIAL
Basophils Relative: 1 % (ref 0–1)
Eosinophils Absolute: 0.1 10*3/uL (ref 0.0–0.7)
Eosinophils Relative: 1 % (ref 0–5)
Lymphs Abs: 1.8 10*3/uL (ref 0.7–4.0)
Neutrophils Relative %: 65 % (ref 43–77)

## 2010-09-09 LAB — COMPREHENSIVE METABOLIC PANEL
ALT: 17 U/L (ref 0–35)
AST: 18 U/L (ref 0–37)
Alkaline Phosphatase: 58 U/L (ref 39–117)
CO2: 30 mEq/L (ref 19–32)
Calcium: 9.6 mg/dL (ref 8.4–10.5)
Chloride: 103 mEq/L (ref 96–112)
GFR calc Af Amer: >60 mL/min (ref >60)
GFR calc non Af Amer: >60 mL/min (ref >60)
Glucose, Bld: 90 mg/dL (ref 70–99)
Potassium: 4.1 mEq/L (ref 3.5–5.1)
Sodium: 139 mEq/L (ref 135–145)

## 2010-09-09 LAB — CBC
Hemoglobin: 14.4 g/dL (ref 12.0–15.0)
RBC: 4.6 MIL/uL (ref 3.87–5.11)
WBC: 6.8 10*3/uL (ref 4.0–10.5)

## 2010-09-09 LAB — VITAMIN D 1,25 DIHYDROXY
Vitamin D 1, 25 (OH)2 Total: 54 pg/mL (ref 18–72)
Vitamin D3 1, 25 (OH)2: 54 pg/mL

## 2010-09-09 LAB — PROTIME-INR: Prothrombin Time: 12.4 seconds (ref 11.6–15.2)

## 2010-09-10 ENCOUNTER — Ambulatory Visit (INDEPENDENT_AMBULATORY_CARE_PROVIDER_SITE_OTHER): Payer: Medicare Other | Admitting: *Deleted

## 2010-09-10 ENCOUNTER — Ambulatory Visit (HOSPITAL_COMMUNITY): Payer: Medicare Other | Attending: Internal Medicine

## 2010-09-10 DIAGNOSIS — I429 Cardiomyopathy, unspecified: Secondary | ICD-10-CM

## 2010-09-10 DIAGNOSIS — I059 Rheumatic mitral valve disease, unspecified: Secondary | ICD-10-CM | POA: Insufficient documentation

## 2010-09-10 DIAGNOSIS — I428 Other cardiomyopathies: Secondary | ICD-10-CM | POA: Insufficient documentation

## 2010-09-10 DIAGNOSIS — I379 Nonrheumatic pulmonary valve disorder, unspecified: Secondary | ICD-10-CM | POA: Insufficient documentation

## 2010-09-10 DIAGNOSIS — E785 Hyperlipidemia, unspecified: Secondary | ICD-10-CM | POA: Insufficient documentation

## 2010-09-10 DIAGNOSIS — I079 Rheumatic tricuspid valve disease, unspecified: Secondary | ICD-10-CM | POA: Insufficient documentation

## 2010-09-10 DIAGNOSIS — I4891 Unspecified atrial fibrillation: Secondary | ICD-10-CM

## 2010-10-01 ENCOUNTER — Ambulatory Visit (INDEPENDENT_AMBULATORY_CARE_PROVIDER_SITE_OTHER): Payer: Medicare Other | Admitting: *Deleted

## 2010-10-01 DIAGNOSIS — I4891 Unspecified atrial fibrillation: Secondary | ICD-10-CM

## 2010-10-01 LAB — POCT INR: INR: 2.2

## 2010-10-08 NOTE — Assessment & Plan Note (Signed)
[facility]                            CARDIOLOGY OFFICE NOTE   Pamela Whitaker, Pamela Whitaker                      MRN:          [id number]  DATE:05/23/2008                            DOB:          [birth year]    The delightful 77 year old patient referred by Dr. Elisabeth Most and Dr.  Ranell Patrick for preop clearance.  The patient needs right knee surgery.  She  has had multiple orthopedic procedures done in the past without  significant cardiac problems.  She has a history of nontreated  hyperlipidemia.  Her most recent surgery of her left wrist was just done  in August without significant problems.   The patient has had some skipped beats on physical exam.  She had an EKG  done at Dr. Hardie Pulley office, which I do not have a copy of.   In talking to the patient, she has been as active as she can with her  right knee pain.  She is not having significant presyncope,  palpitations, PND, or orthopnea.  She has not had chest pain.   She has not had a previous stress test.   She has not had any untoward effects from previous general anesthesia.  She has had no bleeding diathesis.   Her review of systems otherwise negative.   PAST MEDICAL HISTORY:  Remarkable for breast cancer in 1987 with a left  mastectomy, hyperlipidemia, previous right fibular fracture, operated on  by Dr. Ranell Patrick, history of osteoporosis, restless leg syndrome, history  of osteoarthritis, history of bone chips in the right knee, lumbar  stenosis, previous right knee arthroscopy in 2005.  She is currently on  ReQuip for restless leg syndromes, multivitamins, Motrin, alendronate  for osteoporosis.  She has no known allergies.  She does not like taking  cholesterol medicine.   FAMILY HISTORY:  Remarkable for father dying at age 42 of an MI.  Her  mother died at age 37 from an aortic aneurysm.   She is happily married.  She is quite talkative.  She has been married  over 52 years to her husband  who is with her.  She has two children.  She does not smoke or drink.  She gets around fairly well outside of her  right knee pain.   PHYSICAL EXAMINATION:  GENERAL:  Remarkable for elderly female in no  distress.  VITAL SIGNS:  Blood pressure is 130/80, pulse is 70 and regular,  respiratory rate 14, afebrile.  HEENT:  Unremarkable.  Carotids normal without bruit.  No  lymphadenopathy, thyromegaly, JVP elevation.  LUNGS:  Clear.  Good diaphragmatic motion.  No wheezing.  HEART:  S1, S2,  normal heart sounds, PMI normal.  ABDOMEN:  Benign.  Bowel sounds positive.  No AAA, no tenderness, no  bruit, no hepatosplenomegaly, hepatojugular reflux, or  tenderness.  EXTREMITIES:  Distal pulses are intact.  No edema.  NEURO:  Nonfocal.  SKIN:  Warm and dry.  No muscular weakness.   EKG shows sinus rhythm without acute changes or previous MI.  There is  an occasional PAC.   I note that Dr. Hardie Pulley note indicated  an EKG which was normal  outside of an occasional PVC.   IMPRESSION:  1. Preoperative clearance.  The patient has had multiple orthopedic      surgeries before.  She is asymptomatic.  She does not need a      Myoview or Cardiolite prior to clearing her for surgery.  2. Occasional premature ventricular contractions.  No evidence of      malignant arrhythmias, do not think the patient needs beta blockade      prior to any surgery.  We would be happy to see the patient in the      hospital should she develop any significant arrhythmias.  3. Hyperlipidemia.  Given the patient's advanced age and lack of      vascular disease, diet control was probably elevated as needed.      She is resistant to drug therapy for this.  She will continue to      follow with Dr. Elisabeth Most.  4. Restless leg syndrome.  Continue ReQuip, seems to be beneficial.  5. Multiple orthopedic issues, to follow up with Dr. Ranell Patrick.  Okay to      have right knee replacement.   Typical precautions regarding DVT  prophylaxis should be initiated.     Noralyn Pick. Eden Emms, MD, [facility]  Electronically Signed    PCN/MedQ  DD: 05/23/2008  DT: 05/23/2008  Job #: [id number]   cc:   Almedia Balls. Ranell Patrick, M.D.  Lovenia Kim, D.O.

## 2010-10-29 ENCOUNTER — Ambulatory Visit (INDEPENDENT_AMBULATORY_CARE_PROVIDER_SITE_OTHER): Payer: Medicare Other | Admitting: *Deleted

## 2010-10-29 DIAGNOSIS — I4891 Unspecified atrial fibrillation: Secondary | ICD-10-CM

## 2010-10-29 LAB — POCT INR: INR: 3.2

## 2010-11-14 ENCOUNTER — Encounter: Payer: Self-pay | Admitting: Cardiovascular Disease

## 2010-11-14 ENCOUNTER — Ambulatory Visit (INDEPENDENT_AMBULATORY_CARE_PROVIDER_SITE_OTHER): Payer: Medicare Other | Admitting: Cardiovascular Disease

## 2010-11-14 ENCOUNTER — Ambulatory Visit (INDEPENDENT_AMBULATORY_CARE_PROVIDER_SITE_OTHER): Payer: Medicare Other | Admitting: *Deleted

## 2010-11-14 DIAGNOSIS — I4891 Unspecified atrial fibrillation: Secondary | ICD-10-CM

## 2010-11-14 DIAGNOSIS — R0989 Other specified symptoms and signs involving the circulatory and respiratory systems: Secondary | ICD-10-CM

## 2010-11-14 DIAGNOSIS — M1990 Unspecified osteoarthritis, unspecified site: Secondary | ICD-10-CM

## 2010-11-14 DIAGNOSIS — E785 Hyperlipidemia, unspecified: Secondary | ICD-10-CM

## 2010-11-14 DIAGNOSIS — I428 Other cardiomyopathies: Secondary | ICD-10-CM

## 2010-11-14 LAB — POCT INR: INR: 2.3

## 2010-11-14 NOTE — Assessment & Plan Note (Signed)
Cholesterol is at goal.  Continue current dose of statin and diet Rx.  No myalgias or side effects.  F/U  LFT's in 6 months. No results found for this basename: LDLCALC             

## 2010-11-14 NOTE — Assessment & Plan Note (Signed)
Referred her to Dr Despina Hick or Charlann Boxer She has already seen a few ortho doctors including Ranell Patrick but not happy with them

## 2010-11-14 NOTE — Patient Instructions (Signed)
For Knee Replacement:  Dr Ollen Gross [phone] or Dr Durene Romans [phone]  Your physician recommends that you schedule a follow-up appointment in: 8 weeks

## 2010-11-14 NOTE — Progress Notes (Signed)
Pamela Whitaker is a 77 -year-old female with a history of paroxysmal atrial fibrillation. She was placed on Multaq for about 6 months and maintained sinus rhythm. Multaq stopped in June of 2011. She has reduced ejection fraction at 30% with diffuse hypokinesis, very mild AS with a mean gradient of 4, mild MR, mild LAE and mod RAE and PASP 35 mmHg. Recent CHF exacerbation with BNP 900 range improved with Lasix . Her chest x-ray did show edema. Her creatinine was normal at 0.7 and hemoglobin was 15.1. TSH was normal at 3.57. Myovue with EF 31% and no ischemia or infarct.   Nonishcemic DCM. Stopped amiodarone on her own. with no palpitations She is in afib today and is asymptomatic. She does not want [facility] and does not want to take amiodarone Had a long talk with her about compliance. She is only taking her lopressor once aday and not taking her ARB.    Discussed importance of better rate control and EF.  Will place back on coumadin.  Would like to proceed with [facility] in future but she is not willing at this time.  Will reevaluate in a few weeks.      ROS: Denies fever, malais, weight loss, blurry vision, decreased visual acuity, cough, sputum, , hemoptysis, pleuritic pain, palpitaitons, heartburn, abdominal pain, melena, lower extremity edema, claudication, or rash.  All other systems reviewed and negative  General: Affect appropriate Healthy:  appears stated age HEENT: normal Neck supple with no adenopathy JVP normal no bruits no thyromegaly Lungs clear with no wheezing and good diaphragmatic motion Heart:  S1/S2 no murmur,rub, gallop or click PMI normal Abdomen: benighn, BS positve, no tenderness, no AAA no bruit.  No HSM or HJR Distal pulses intact with no bruits No edema Neuro non-focal Skin warm and dry No muscular weakness   Current Outpatient Prescriptions  Medication Sig Dispense Refill  . furosemide (LASIX) 20 MG tablet Take 20 mg by mouth daily.        Marland Kitchen losartan (COZAAR) 50 MG tablet  Take 50 mg by mouth daily.        . metoprolol (LOPRESSOR) 100 MG tablet Take 100 mg by mouth daily.        Marland Kitchen warfarin (COUMADIN) 5 MG tablet Take by mouth as directed.        Marland Kitchen DISCONTD: metoprolol (LOPRESSOR) 100 MG tablet Take 1 tablet (100 mg total) by mouth 2 (two) times daily.  60 tablet  11    Allergies  Review of patient's allergies indicates no known allergies.  Electrocardiogram:  Assessment and Plan

## 2010-11-14 NOTE — Assessment & Plan Note (Signed)
Poor rate control due to noncompliance.  Not willing to have [facility].  Reevaluate in a few weeks when she is taking her ARB and BB.  Would be candidate for Tikosyn but she doesn't want to go in hospital

## 2010-11-14 NOTE — Assessment & Plan Note (Signed)
Functional class two and appears euvolemic.  Improved rate control of afib may help.  IF HR up with compliance may add digoxen.

## 2010-12-12 ENCOUNTER — Ambulatory Visit (INDEPENDENT_AMBULATORY_CARE_PROVIDER_SITE_OTHER): Payer: Medicare Other | Admitting: *Deleted

## 2010-12-12 DIAGNOSIS — I4891 Unspecified atrial fibrillation: Secondary | ICD-10-CM

## 2011-01-09 ENCOUNTER — Encounter: Payer: Self-pay | Admitting: Cardiovascular Disease

## 2011-01-16 ENCOUNTER — Encounter: Payer: Self-pay | Admitting: Cardiovascular Disease

## 2011-01-16 ENCOUNTER — Encounter: Payer: Medicare Other | Admitting: *Deleted

## 2011-01-16 ENCOUNTER — Ambulatory Visit (INDEPENDENT_AMBULATORY_CARE_PROVIDER_SITE_OTHER): Payer: Medicare Other | Admitting: Cardiovascular Disease

## 2011-01-16 ENCOUNTER — Ambulatory Visit (INDEPENDENT_AMBULATORY_CARE_PROVIDER_SITE_OTHER): Payer: Medicare Other | Admitting: *Deleted

## 2011-01-16 DIAGNOSIS — I428 Other cardiomyopathies: Secondary | ICD-10-CM

## 2011-01-16 DIAGNOSIS — I4891 Unspecified atrial fibrillation: Secondary | ICD-10-CM

## 2011-01-16 DIAGNOSIS — E785 Hyperlipidemia, unspecified: Secondary | ICD-10-CM

## 2011-01-16 DIAGNOSIS — M1990 Unspecified osteoarthritis, unspecified site: Secondary | ICD-10-CM

## 2011-01-16 LAB — POCT INR: INR: 2.7

## 2011-01-16 NOTE — Assessment & Plan Note (Signed)
Elevated.  Checked with primary  Does not want to start medicine

## 2011-01-16 NOTE — Assessment & Plan Note (Signed)
Euvolemic.  She is taking her meds better.  F/U echo in a year

## 2011-01-16 NOTE — Assessment & Plan Note (Signed)
Good rate control  Coumadin check today.  Proceed with [facility] when she wants to.  I think given her decreased EF I will continue to push for this

## 2011-01-16 NOTE — Progress Notes (Signed)
Pamela Whitaker is a 77 -year-old female with a history of paroxysmal atrial fibrillation. She was placed on Multaq for about 6 months and maintained sinus rhythm. Multaq stopped in June of 2011. She has reduced ejection fraction at 30% with diffuse hypokinesis, very mild AS with a mean gradient of 4, mild MR, mild LAE and mod RAE and PASP 35 mmHg. Recent CHF exacerbation with BNP 900 range improved with Lasix . Her chest x-ray did show edema. Her creatinine was normal at 0.7 and hemoglobin was 15.1. TSH was normal at 3.57. Myovue with EF 31% and no ischemia or infarct.  Nonishcemic DCM. Stopped amiodarone on her own. with no palpitations She is in afib today and is asymptomatic. She does not want [facility] and does not want to take amiodarone Had a long talk with her about compliance. She has been better with her meds since June.  Still does not want to proceed with [facility].  Has trip [facility]  And a tip to [place] to pick apples planned  Will have new primary soon as Pamela Whitaker is leaving.  [name] will do physical possibly see Dr Marvel Plan  ROS: Denies fever, malais, weight loss, blurry vision, decreased visual acuity, cough, sputum, SOB, hemoptysis, pleuritic pain, palpitaitons, heartburn, abdominal pain, melena, lower extremity edema, claudication, or rash.  All other systems reviewed and negative  General: Affect appropriate Healthy:  appears stated age HEENT: normal Neck supple with no adenopathy JVP normal no bruits no thyromegaly Lungs clear with no wheezing and good diaphragmatic motion Heart:  S1/S2 no murmur,rub, gallop or click PMI normal Abdomen: benighn, BS positve, no tenderness, no AAA no bruit.  No HSM or HJR Distal pulses intact with no bruits No edema Neuro non-focal Skin warm and dry No muscular weakness   Current Outpatient Prescriptions  Medication Sig Dispense Refill  . furosemide (LASIX) 20 MG tablet Take 20 mg by mouth as needed.       Marland Kitchen losartan (COZAAR) 50 MG tablet  Take 50 mg by mouth daily.        . metoprolol (LOPRESSOR) 100 MG tablet Take 100 mg by mouth daily.        Marland Kitchen warfarin (COUMADIN) 5 MG tablet Take by mouth as directed.          Allergies  Review of patient's allergies indicates no known allergies.  Electrocardiogram:  Assessment and Plan

## 2011-01-16 NOTE — Assessment & Plan Note (Signed)
Saw Dr Berton Lan Does not need RTKR.  Continue NSAI's and ortho F/U

## 2011-01-16 NOTE — Patient Instructions (Signed)
Your physician recommends that you schedule a follow-up appointment in: one month 

## 2011-01-21 ENCOUNTER — Ambulatory Visit (INDEPENDENT_AMBULATORY_CARE_PROVIDER_SITE_OTHER): Payer: Medicare Other | Admitting: *Deleted

## 2011-01-21 DIAGNOSIS — I4891 Unspecified atrial fibrillation: Secondary | ICD-10-CM

## 2011-01-21 LAB — POCT INR: INR: 3.6

## 2011-01-28 ENCOUNTER — Ambulatory Visit (INDEPENDENT_AMBULATORY_CARE_PROVIDER_SITE_OTHER): Payer: Medicare Other | Admitting: *Deleted

## 2011-01-28 DIAGNOSIS — I4891 Unspecified atrial fibrillation: Secondary | ICD-10-CM

## 2011-02-04 ENCOUNTER — Ambulatory Visit (INDEPENDENT_AMBULATORY_CARE_PROVIDER_SITE_OTHER): Payer: Medicare Other | Admitting: *Deleted

## 2011-02-04 DIAGNOSIS — I4891 Unspecified atrial fibrillation: Secondary | ICD-10-CM

## 2011-02-11 ENCOUNTER — Ambulatory Visit (INDEPENDENT_AMBULATORY_CARE_PROVIDER_SITE_OTHER): Payer: Medicare Other | Admitting: *Deleted

## 2011-02-11 DIAGNOSIS — I4891 Unspecified atrial fibrillation: Secondary | ICD-10-CM

## 2011-02-11 LAB — POCT INR: INR: 2.4

## 2011-02-12 LAB — CBC
HCT: 40.2
Hemoglobin: 13.7
MCV: 89.5
WBC: 7.5

## 2011-02-17 ENCOUNTER — Ambulatory Visit: Payer: Medicare Other | Admitting: Cardiovascular Disease

## 2011-02-19 ENCOUNTER — Encounter: Payer: Self-pay | Admitting: *Deleted

## 2011-02-19 ENCOUNTER — Encounter: Payer: Self-pay | Admitting: Cardiovascular Disease

## 2011-02-19 ENCOUNTER — Ambulatory Visit (INDEPENDENT_AMBULATORY_CARE_PROVIDER_SITE_OTHER): Payer: Medicare Other | Admitting: *Deleted

## 2011-02-19 ENCOUNTER — Ambulatory Visit (INDEPENDENT_AMBULATORY_CARE_PROVIDER_SITE_OTHER): Payer: Medicare Other | Admitting: Cardiovascular Disease

## 2011-02-19 DIAGNOSIS — I4891 Unspecified atrial fibrillation: Secondary | ICD-10-CM

## 2011-02-19 DIAGNOSIS — I428 Other cardiomyopathies: Secondary | ICD-10-CM

## 2011-02-19 DIAGNOSIS — Z01810 Encounter for preprocedural cardiovascular examination: Secondary | ICD-10-CM

## 2011-02-19 DIAGNOSIS — E785 Hyperlipidemia, unspecified: Secondary | ICD-10-CM

## 2011-02-19 LAB — CBC WITH DIFFERENTIAL/PLATELET
Basophils Relative: 0.4 % (ref 0.0–3.0)
Eosinophils Absolute: 0.1 10*3/uL (ref 0.0–0.7)
Hemoglobin: 14.4 g/dL (ref 12.0–15.0)
Lymphs Abs: 1.6 10*3/uL (ref 0.7–4.0)
MCHC: 34.5 g/dL (ref 30.0–36.0)
MCV: 93.6 fl (ref 78.0–100.0)
Monocytes Absolute: 0.5 10*3/uL (ref 0.1–1.0)
Neutro Abs: 6.1 10*3/uL (ref 1.4–7.7)
RBC: 4.47 Mil/uL (ref 3.87–5.11)
RDW: 13.2 % (ref 11.5–14.6)

## 2011-02-19 MED ORDER — METOPROLOL TARTRATE 100 MG PO TABS: 100.0000 mg | ORAL_TABLET | Freq: Every day | ORAL | Status: DC

## 2011-02-19 NOTE — Progress Notes (Signed)
Addended by: Scherrie Bateman E on: 02/19/2011 10:55 AM   Modules accepted: Orders

## 2011-02-19 NOTE — Progress Notes (Signed)
Pamela Whitaker is a 77 -year-old female with a history of paroxysmal atrial fibrillation. She was placed on Multaq for about 6 months and maintained sinus rhythm. Multaq stopped in June of 2011. She has reduced ejection fraction at 30% with diffuse hypokinesis, very mild AS with a mean gradient of 4, mild MR, mild LAE and mod RAE and PASP 35 mmHg. Recent CHF exacerbation with BNP 900 range improved with Lasix . Her chest x-ray did show edema. Her creatinine was normal at 0.7 and hemoglobin was 15.1. TSH was normal at 3.57. Myovue with EF 31% and no ischemia or infarct.  Nonishcemic DCM. Stopped amiodarone on her own. with no palpitations She is in afib today and is asymptomatic.  She has consented to having a DCC.  I think we should pursue this at least once since she has decreased EF INR;s have been Rx.  3.4 today.  ROS: Denies fever, malais, weight loss, blurry vision, decreased visual acuity, cough, sputum, SOB, hemoptysis, pleuritic pain, palpitaitons, heartburn, abdominal pain, melena, lower extremity edema, claudication, or rash.  All other systems reviewed and negative  General: Affect appropriate Healthy:  appears stated age HEENT: normal Neck supple with no adenopathy JVP normal no bruits no thyromegaly Lungs clear with no wheezing and good diaphragmatic motion Heart:  S1/S2 no murmur,rub, gallop or click PMI normal Abdomen: benighn, BS positve, no tenderness, no AAA no bruit.  No HSM or HJR Distal pulses intact with no bruits No edema Neuro non-focal Skin warm and dry No muscular weakness   Current Outpatient Prescriptions  Medication Sig Dispense Refill  . furosemide (LASIX) 20 MG tablet Take 20 mg by mouth as needed.       Marland Kitchen losartan (COZAAR) 50 MG tablet Take 50 mg by mouth daily.        . metoprolol (LOPRESSOR) 100 MG tablet Take 100 mg by mouth. 1/2 TAB  TWICE DAILY       . warfarin (COUMADIN) 5 MG tablet Take by mouth as directed.          Allergies  Review of  patient's allergies indicates no known allergies.  Electrocardiogram:  Afib rate 93  PVC otherwise normal  Assessment and Plan

## 2011-02-19 NOTE — Assessment & Plan Note (Signed)
Good rate control and anticoagulation  DCC next Thursday

## 2011-02-19 NOTE — Patient Instructions (Signed)
Your physician recommends that you schedule a follow-up appointment in: 2-3 WEEKS WITH DR Eden Emms AFTER CARDIOVERSION ON 02-27-11 Your physician recommends that you continue on your current medications as directed. Please refer to the Current Medication list given to you today. Your physician recommends that you return for lab work in: TODAY BMET CBC MAG  DX V72.81 427.31 Your physician has recommended that you have a Cardioversion (DCCV). Electrical Cardioversion uses a jolt of electricity to your heart either through paddles or wired patches attached to your chest. This is a controlled, usually prescheduled, procedure. Defibrillation is done under light anesthesia in the hospital, and you usually go home the day of the procedure. This is done to get your heart back into a normal rhythm. You are not awake for the procedure. Please see the instruction sheet given to you today. 02-27-11 WITH DR Eden Emms

## 2011-02-19 NOTE — Assessment & Plan Note (Signed)
Functional class one.  Euvolemic.  F/U echo 3 months after [facility] if succesful to see if EF improved

## 2011-02-19 NOTE — Assessment & Plan Note (Signed)
Cholesterol is at goal.  Continue current dose of statin and diet Rx.  No myalgias or side effects.  F/U  LFT's in 6 months. No results found for this basename: LDLCALC             

## 2011-02-26 ENCOUNTER — Ambulatory Visit (INDEPENDENT_AMBULATORY_CARE_PROVIDER_SITE_OTHER): Payer: Medicare Other | Admitting: *Deleted

## 2011-02-26 DIAGNOSIS — I4891 Unspecified atrial fibrillation: Secondary | ICD-10-CM

## 2011-02-27 ENCOUNTER — Ambulatory Visit (HOSPITAL_COMMUNITY)
Admission: RE | Admit: 2011-02-27 | Discharge: 2011-02-27 | Disposition: A | Discharge status: Home / Self Care | Payer: Medicare Other | Source: Ambulatory Visit | Attending: Cardiovascular Disease | Admitting: Cardiovascular Disease

## 2011-02-27 ENCOUNTER — Encounter: Payer: Medicare Other | Admitting: *Deleted

## 2011-02-27 DIAGNOSIS — Z01812 Encounter for preprocedural laboratory examination: Secondary | ICD-10-CM | POA: Insufficient documentation

## 2011-02-27 DIAGNOSIS — Z538 Procedure and treatment not carried out for other reasons: Secondary | ICD-10-CM | POA: Insufficient documentation

## 2011-02-27 DIAGNOSIS — I4891 Unspecified atrial fibrillation: Secondary | ICD-10-CM | POA: Insufficient documentation

## 2011-02-27 DIAGNOSIS — Z01810 Encounter for preprocedural cardiovascular examination: Secondary | ICD-10-CM | POA: Insufficient documentation

## 2011-02-27 LAB — BASIC METABOLIC PANEL
BUN: 13 mg/dL (ref 6–23)
Calcium: 9.6 mg/dL (ref 8.4–10.5)
Creatinine, Ser: 0.68 mg/dL (ref 0.50–1.10)
GFR calc non Af Amer: 83 mL/min — ABNORMAL LOW (ref >90)
Glucose, Bld: 93 mg/dL (ref 70–99)
Potassium: 4.3 mEq/L (ref 3.5–5.1)

## 2011-02-27 LAB — CBC
HCT: 38.4 % (ref 36.0–46.0)
Hemoglobin: 13.5 g/dL (ref 12.0–15.0)
MCH: 32.1 pg (ref 26.0–34.0)
MCHC: 35.2 g/dL (ref 30.0–36.0)
MCV: 91.2 fL (ref 78.0–100.0)
Platelets: 199 K/uL (ref 150–400)
RBC: 4.21 MIL/uL (ref 3.87–5.11)
RDW: 12.7 % (ref 11.5–15.5)
WBC: 7.2 K/uL (ref 4.0–10.5)

## 2011-02-27 LAB — MAGNESIUM: Magnesium: 1.9 mg/dL (ref 1.5–2.5)

## 2011-03-04 ENCOUNTER — Ambulatory Visit: Payer: Medicare Other | Admitting: Cardiovascular Disease

## 2011-03-05 ENCOUNTER — Telehealth: Payer: Self-pay | Admitting: Cardiovascular Disease

## 2011-03-05 NOTE — Telephone Encounter (Signed)
Pt calling re cxl of cardioversion, was to have a new coumadin appt dr Eden Emms said he would set her up for a new time to get her restarted, hasn't heard from anyone and the only appt was 10-18 which was made for her cardioversion fu

## 2011-03-05 NOTE — Telephone Encounter (Signed)
Spoke with pt, follow up coumadin and appt with dr Eden Emms made Pamela Whitaker

## 2011-03-06 ENCOUNTER — Ambulatory Visit (INDEPENDENT_AMBULATORY_CARE_PROVIDER_SITE_OTHER): Payer: Medicare Other | Admitting: *Deleted

## 2011-03-06 DIAGNOSIS — I4891 Unspecified atrial fibrillation: Secondary | ICD-10-CM

## 2011-03-06 LAB — POCT INR: INR: 2.7

## 2011-03-13 ENCOUNTER — Ambulatory Visit: Payer: Medicare Other | Admitting: Cardiovascular Disease

## 2011-03-13 ENCOUNTER — Ambulatory Visit (INDEPENDENT_AMBULATORY_CARE_PROVIDER_SITE_OTHER): Payer: Medicare Other | Admitting: *Deleted

## 2011-03-13 DIAGNOSIS — I4891 Unspecified atrial fibrillation: Secondary | ICD-10-CM

## 2011-03-20 ENCOUNTER — Encounter: Payer: Self-pay | Admitting: Cardiovascular Disease

## 2011-03-20 ENCOUNTER — Ambulatory Visit (INDEPENDENT_AMBULATORY_CARE_PROVIDER_SITE_OTHER): Payer: Medicare Other | Admitting: Cardiovascular Disease

## 2011-03-20 ENCOUNTER — Ambulatory Visit (INDEPENDENT_AMBULATORY_CARE_PROVIDER_SITE_OTHER): Payer: Medicare Other | Admitting: *Deleted

## 2011-03-20 ENCOUNTER — Encounter: Payer: Self-pay | Admitting: *Deleted

## 2011-03-20 DIAGNOSIS — E785 Hyperlipidemia, unspecified: Secondary | ICD-10-CM

## 2011-03-20 DIAGNOSIS — I5020 Unspecified systolic (congestive) heart failure: Secondary | ICD-10-CM

## 2011-03-20 DIAGNOSIS — I4891 Unspecified atrial fibrillation: Secondary | ICD-10-CM

## 2011-03-20 DIAGNOSIS — I428 Other cardiomyopathies: Secondary | ICD-10-CM

## 2011-03-20 LAB — POCT INR: INR: 2.8

## 2011-03-20 NOTE — Assessment & Plan Note (Signed)
See DCM pan

## 2011-03-20 NOTE — Assessment & Plan Note (Signed)
Euvolemic.  Attempt DCC for AV synchrony.  F/U EF evaluation 1 year

## 2011-03-20 NOTE — Assessment & Plan Note (Signed)
Cholesterol is at goal.  Continue current dose of statin and diet Rx.  No myalgias or side effects.  F/U  LFT's in 6 months. No results found for this basename: LDLCALC             

## 2011-03-20 NOTE — Progress Notes (Signed)
Addended by: Jacqlyn Krauss on: 03/20/2011 09:45 AM   Modules accepted: Orders

## 2011-03-20 NOTE — Assessment & Plan Note (Signed)
Good rate control.  Coumadin Rx  DCC to be scheduled next week

## 2011-03-20 NOTE — Patient Instructions (Addendum)
Your physician has recommended that you have a Cardioversion (DCCV). Electrical Cardioversion uses a jolt of electricity to your heart either through paddles or wired patches attached to your chest. This is a controlled, usually prescheduled, procedure. Defibrillation is done under light anesthesia in the hospital, and you usually go home the day of the procedure. This is done to get your heart back into a normal rhythm. You are not awake for the procedure. Please see the instruction sheet given to you today. Monday October 29,2012  Come to the [facility] office at 8am Monday October 29,2012 for a pro-time. Take your coumadin Monday morning.  Your physician recommends that you schedule a follow-up appointment in: 3-4 weeks with Dr Eden Emms.

## 2011-03-20 NOTE — Progress Notes (Signed)
Pamela Whitaker is a 77 -year-old female with a history of paroxysmal atrial fibrillation. She was placed on Multaq for about 6 months and maintained sinus rhythm. Multaq stopped in June of 2011. She has reduced ejection fraction at 30% with diffuse hypokinesis, very mild AS with a mean gradient of 4, mild MR, mild LAE and mod RAE and PASP 35 mmHg. Recent CHF exacerbation with BNP 900 range improved with Lasix . Her chest x-ray did show edema. Her creatinine was normal at 0.7 and hemoglobin was 15.1. TSH was normal at 3.57. Myovue with EF 31% and no ischemia or infarct. Nonishcemic DCM. Stopped amiodarone on her own. with no palpitations   Been in afib last 2 months. DCC cancelled 3 weeks ago due to low INR.  Reviewed coumadin clinic records and she has been Rx last 3 weeks  ROS: Denies fever, malais, weight loss, blurry vision, decreased visual acuity, cough, sputum, SOB, hemoptysis, pleuritic pain, palpitaitons, heartburn, abdominal pain, melena, lower extremity edema, claudication, or rash.  All other systems reviewed and negative  General: Affect appropriate Healthy:  appears stated age HEENT: normal Neck supple with no adenopathy JVP normal no bruits no thyromegaly Lungs clear with no wheezing and good diaphragmatic motion Heart:  S1/S2 no murmur,rub, gallop or click PMI normal Abdomen: benighn, BS positve, no tenderness, no AAA no bruit.  No HSM or HJR Distal pulses intact with no bruits No edema Neuro non-focal Skin warm and dry No muscular weakness   Current Outpatient Prescriptions  Medication Sig Dispense Refill  . furosemide (LASIX) 20 MG tablet Take 20 mg by mouth as needed.       Marland Kitchen losartan (COZAAR) 50 MG tablet Take 50 mg by mouth daily.        . metoprolol (LOPRESSOR) 100 MG tablet Take 100 mg by mouth. 1/2 TAB  TWICE DAILY       . warfarin (COUMADIN) 5 MG tablet Take by mouth as directed.          Allergies  Review of patient's allergies indicates no known  allergies.  Electrocardiogram:  Assessment and Plan

## 2011-03-24 ENCOUNTER — Ambulatory Visit (INDEPENDENT_AMBULATORY_CARE_PROVIDER_SITE_OTHER): Payer: Medicare Other | Admitting: *Deleted

## 2011-03-24 ENCOUNTER — Ambulatory Visit (HOSPITAL_COMMUNITY)
Admission: RE | Admit: 2011-03-24 | Discharge: 2011-03-24 | Disposition: A | Discharge status: Home / Self Care | Payer: Medicare Other | Source: Ambulatory Visit | Attending: Cardiovascular Disease | Admitting: Cardiovascular Disease

## 2011-03-24 DIAGNOSIS — I4891 Unspecified atrial fibrillation: Secondary | ICD-10-CM

## 2011-03-24 DIAGNOSIS — Z01810 Encounter for preprocedural cardiovascular examination: Secondary | ICD-10-CM | POA: Insufficient documentation

## 2011-03-24 DIAGNOSIS — Z01812 Encounter for preprocedural laboratory examination: Secondary | ICD-10-CM | POA: Insufficient documentation

## 2011-03-24 DIAGNOSIS — E785 Hyperlipidemia, unspecified: Secondary | ICD-10-CM | POA: Insufficient documentation

## 2011-03-24 DIAGNOSIS — I428 Other cardiomyopathies: Secondary | ICD-10-CM | POA: Insufficient documentation

## 2011-03-24 LAB — BASIC METABOLIC PANEL
BUN: 11 mg/dL (ref 6–23)
Calcium: 9.8 mg/dL (ref 8.4–10.5)
GFR calc non Af Amer: 82 mL/min — ABNORMAL LOW (ref >90)
Glucose, Bld: 96 mg/dL (ref 70–99)

## 2011-03-24 LAB — CBC
HCT: 38.1 % (ref 36.0–46.0)
Hemoglobin: 13.1 g/dL (ref 12.0–15.0)
MCH: 31.7 pg (ref 26.0–34.0)
MCHC: 34.4 g/dL (ref 30.0–36.0)

## 2011-03-27 ENCOUNTER — Encounter: Payer: Medicare Other | Admitting: *Deleted

## 2011-03-29 NOTE — Consult Note (Signed)
  NAME:  [name], [name] NO.:  [id number]  MEDICAL RECORD NO.:  [id number]  LOCATION:  [facility]                         FACILITY:  [facility]  PHYSICIAN:  Noralyn Pick. Eden Emms, MD, FACCDATE OF BIRTH:  Jul 25, 1932  DATE OF CONSULTATION: DATE OF DISCHARGE:  03/24/2011                                Cardioversion   A 77 year old patient with nonischemic cardiomyopathy and atrial fibrillation.  The patient presented for cardioversion.  INRs have been therapeutic for over 3 weeks.  INR this morning was 2.6.  The patient was anesthetized with 60 mg of propofol and 40 mg of lidocaine.  The patient received 2 shocks at 150 joules biphasic followed by 200 joules biphasic.  She converted to normal sinus rhythm at a rate of 72 on the 2nd shock.  IMPRESSION:  Successful cardioversion with no immediate neurological sequela.     Noralyn Pick. Eden Emms, MD, [facility]     PCN/MEDQ  D:  03/24/2011  T:  03/24/2011  Job:  [id number]  Electronically Signed by Charlton Haws MD [facility] on 03/29/2011 08:51:25 PM

## 2011-04-01 ENCOUNTER — Ambulatory Visit (INDEPENDENT_AMBULATORY_CARE_PROVIDER_SITE_OTHER): Payer: Medicare Other | Admitting: *Deleted

## 2011-04-01 DIAGNOSIS — I4891 Unspecified atrial fibrillation: Secondary | ICD-10-CM

## 2011-04-08 ENCOUNTER — Encounter: Payer: Medicare Other | Admitting: *Deleted

## 2011-04-09 ENCOUNTER — Ambulatory Visit (INDEPENDENT_AMBULATORY_CARE_PROVIDER_SITE_OTHER): Payer: Medicare Other | Admitting: *Deleted

## 2011-04-09 DIAGNOSIS — I4891 Unspecified atrial fibrillation: Secondary | ICD-10-CM

## 2011-04-15 ENCOUNTER — Encounter: Payer: Self-pay | Admitting: Cardiovascular Disease

## 2011-04-15 ENCOUNTER — Ambulatory Visit (INDEPENDENT_AMBULATORY_CARE_PROVIDER_SITE_OTHER): Payer: Medicare Other | Admitting: *Deleted

## 2011-04-15 ENCOUNTER — Ambulatory Visit (INDEPENDENT_AMBULATORY_CARE_PROVIDER_SITE_OTHER): Payer: Medicare Other | Admitting: Cardiovascular Disease

## 2011-04-15 DIAGNOSIS — I4891 Unspecified atrial fibrillation: Secondary | ICD-10-CM

## 2011-04-15 DIAGNOSIS — Z7901 Long term (current) use of anticoagulants: Secondary | ICD-10-CM

## 2011-04-15 DIAGNOSIS — E785 Hyperlipidemia, unspecified: Secondary | ICD-10-CM

## 2011-04-15 DIAGNOSIS — I428 Other cardiomyopathies: Secondary | ICD-10-CM

## 2011-04-15 DIAGNOSIS — Z5181 Encounter for therapeutic drug level monitoring: Secondary | ICD-10-CM

## 2011-04-15 MED ORDER — METOPROLOL TARTRATE 100 MG PO TABS: ORAL_TABLET | ORAL | Status: DC

## 2011-04-15 NOTE — Patient Instructions (Signed)
Your physician recommends that you schedule a follow-up appointment in: 3 MONTHS WITH DR [facility] Your physician has recommended you make the following change in your medication: LOPRESSOR 100 MG EVERY AM AND 50 MG EVERY PM

## 2011-04-15 NOTE — Progress Notes (Signed)
Pamela Whitaker is a 77 -year-old female with a history of paroxysmal atrial fibrillation. She was placed on Multaq for about 6 months and maintained sinus rhythm. Multaq stopped in June of 2011. She has reduced ejection fraction at 30% with diffuse hypokinesis, very mild AS with a mean gradient of 4, mild MR, mild LAE and mod RAE and PASP 35 mmHg. Recent CHF exacerbation with BNP 900 range improved with Lasix . Her chest x-ray did show edema. Her creatinine was normal at 0.7 and hemoglobin was 15.1. TSH was normal at 3.57. Myovue with EF 31% and no ischemia or infarct. Nonishcemic DCM. Stopped amiodarone on her own. with no palpitations   Cardioverted on 10/29.  Back in afib.  Discussed options.  Prefer rate control and anticoagulaiton.    ROS: Denies fever, malais, weight loss, blurry vision, decreased visual acuity, cough, sputum, SOB, hemoptysis, pleuritic pain, palpitaitons, heartburn, abdominal pain, melena, lower extremity edema, claudication, or rash.  All other systems reviewed and negative  General: Affect appropriate Healthy:  appears stated age HEENT: normal Neck supple with no adenopathy JVP normal no bruits no thyromegaly Lungs clear with no wheezing and good diaphragmatic motion Heart:  S1/S2 no murmur,rub, gallop or click PMI normal Abdomen: benighn, BS positve, no tenderness, no AAA no bruit.  No HSM or HJR Distal pulses intact with no bruits No edema Neuro non-focal Skin warm and dry No muscular weakness   Current Outpatient Prescriptions  Medication Sig Dispense Refill  . furosemide (LASIX) 20 MG tablet Take 20 mg by mouth as needed.       Marland Kitchen losartan (COZAAR) 50 MG tablet Take 50 mg by mouth daily.        . metoprolol (LOPRESSOR) 100 MG tablet 1/2 TAB  TWICE DAILY      . Multiple Vitamin (MULTIVITAMIN) capsule Take 1 capsule by mouth daily.        Marland Kitchen warfarin (COUMADIN) 5 MG tablet Take by mouth as directed.          Allergies  Review of patient's allergies  indicates no known allergies.  Electrocardiogram:  Assessment and Plan

## 2011-04-15 NOTE — Assessment & Plan Note (Signed)
Euvolemic funcional class one.  F/U echo in 6 months  Encouraged her to take lasix

## 2011-04-15 NOTE — Assessment & Plan Note (Signed)
Failed DCC.  Will increase lopresser and settle for rate control and anticoagulaiton

## 2011-04-15 NOTE — Assessment & Plan Note (Signed)
Discussed pradaxa or xeralto.  Patient doesn't mind coumadin.  INR Rx with no bleeding problems

## 2011-04-15 NOTE — Assessment & Plan Note (Signed)
Cholesterol is at goal.  Continue current dose of statin and diet Rx.  No myalgias or side effects.  F/U  LFT's in 6 months. No results found for this basename: LDLCALC             

## 2011-04-22 ENCOUNTER — Ambulatory Visit (INDEPENDENT_AMBULATORY_CARE_PROVIDER_SITE_OTHER): Payer: Medicare Other | Admitting: *Deleted

## 2011-04-22 ENCOUNTER — Encounter: Payer: Self-pay | Admitting: Cardiovascular Disease

## 2011-04-22 DIAGNOSIS — I4891 Unspecified atrial fibrillation: Secondary | ICD-10-CM

## 2011-05-09 ENCOUNTER — Other Ambulatory Visit: Payer: Self-pay | Admitting: Internal Medicine

## 2011-05-16 ENCOUNTER — Ambulatory Visit (INDEPENDENT_AMBULATORY_CARE_PROVIDER_SITE_OTHER): Payer: Medicare Other | Admitting: *Deleted

## 2011-05-16 DIAGNOSIS — I4891 Unspecified atrial fibrillation: Secondary | ICD-10-CM

## 2011-06-03 ENCOUNTER — Other Ambulatory Visit (HOSPITAL_COMMUNITY): Payer: Self-pay | Admitting: Oncology

## 2011-06-03 DIAGNOSIS — Z139 Encounter for screening, unspecified: Secondary | ICD-10-CM

## 2011-06-12 ENCOUNTER — Ambulatory Visit (INDEPENDENT_AMBULATORY_CARE_PROVIDER_SITE_OTHER): Payer: Medicare Other | Admitting: *Deleted

## 2011-06-12 DIAGNOSIS — I4891 Unspecified atrial fibrillation: Secondary | ICD-10-CM

## 2011-06-12 LAB — POCT INR: INR: 2.4

## 2011-06-25 ENCOUNTER — Encounter (HOSPITAL_COMMUNITY): Payer: Medicare Other | Attending: Oncology | Admitting: Oncology

## 2011-06-25 ENCOUNTER — Encounter (HOSPITAL_COMMUNITY): Payer: Self-pay | Admitting: Oncology

## 2011-06-25 VITALS — BP 138/83 | HR 78 | Temp 98.0°F | Wt 151.6 lb

## 2011-06-25 DIAGNOSIS — M899 Disorder of bone, unspecified: Secondary | ICD-10-CM

## 2011-06-25 DIAGNOSIS — C50919 Malignant neoplasm of unspecified site of unspecified female breast: Secondary | ICD-10-CM

## 2011-06-25 DIAGNOSIS — I4891 Unspecified atrial fibrillation: Secondary | ICD-10-CM

## 2011-06-25 DIAGNOSIS — Z853 Personal history of malignant neoplasm of breast: Secondary | ICD-10-CM

## 2011-06-25 NOTE — Progress Notes (Signed)
CC:   Lucky Cowboy, M.D. [place] Daphine Deutscher, MD Noralyn Pick. Eden Emms, MD, [facility]  DIAGNOSES: 1. Stage III adenocarcinoma of the breast with surgery on 03/31/1986     consisting of a left mastectomy and reconstruction by Dr. Pleas Patricia for a 6.5 cm primary with 2/27 positive nodes status     post CAFVP chemotherapy.  She took chemotherapy for approximately a     year and then hormonal therapy for no more than 12 months and     stopped it due to intolerance.  At that time, we had tried her on     Megace and tamoxifen and she opted not to try anything further. 2. Degenerative joint disease of the knees, right greater than left.     She has seeing Dr. Aldean Baker in the past.  I have asked her to     see him again if she so desires.  She is having more difficulty     walking.  She is using very small steps.  She states that she fell     because her knee gave way and broke her wrist last year. 3. Osteopenia.  She needs a repeat bone density this early March. 4. Atrial fibrillation for which she is seeing Dr. Charlton Haws.  She     is on Coumadin with nice control and she has had 1 attempt at     cardioversion which, she states, failed. 5. Hypercholesterolemia on therapy in the past.  I do not see any     drugs for that presently.  HISTORY:  Maille looks good.  I think she is a little forgetful at times. Of course, she is 78 years old and soon to be 9, but she and her husband are living independently still.  When I came into the room, she got up to greet me and she was very stiff and took very small steps to the exam table.  She just does not look as steady on her feet as she once did.  She also wanted to go in some detail about her heart which is in atrial fibrillation.  It sounds like she comes in and out of atrial fibrillation which is why she needs to be on the Coumadin and I did try to explain that to her.  She would like to come off the Coumadin, but I instructed her that  Dr. Eden Emms could only determine that if she stays in normal sinus rhythm should cardioversion be attempted again.  She thinks that she was told that her knee needs to be replaced, but the last note I have from Dr. Lajoyce Corners, who saw her in February last year, indicated that he wanted to treat her very conservatively with physical therapy, etc.  She states that she has also seen Dr. Hayden Rasmussen in consultation, but I do not have notes from him on her.  She also states she saw Dr. Lequita Halt.  PHYSICAL EXAMINATION:  Vital Signs:  She has pretty stable vital signs. Weight is very stable.  Her pulse is right around 80 and irregular. Respirations 16 and unlabored.  Blood pressure 130/83.  Temperature is normal.  General Appearance:  She is not in pain presently that she admits to.  She is in no acute distress.  She is alert.  She follows all commands.  She does have trouble with dates and when she saw people and what they said, however.  She denies shortness of breath.  Lungs: Clear.  Heart:  Rhythm is irregularly irregular, consistent with atrial fibrillation.  Pulse right around 80.  There is no obvious S3 gallop. Breast Exam:  Bilaterally negative for any masses.  Abdomen:  Soft and nontender without organomegaly.  Bowel sounds are diminished but present.  Extremities:  She has no leg edema or arm edema.  PLAN:  I did not do any blood work on her today since she is out so many years from presentation.  She has had some blood work by Dr. Eden Emms in the fall of last year and I do not think I need to repeat that.  We will see her in a year, sooner if need be.  She does need a bone density which we will schedule for early March and then we will speak to her about any change in therapy.    ______________________________ Ladona Horns. Mariel Sleet, MD ESN/MEDQ  D:  06/25/2011  T:  06/25/2011  Job:  [id number]

## 2011-06-25 NOTE — Progress Notes (Signed)
This office note has been dictated.

## 2011-06-25 NOTE — Patient Instructions (Signed)
Pamela Whitaker  [id number] 03/09/1933   [facility]  Discharge Instructions  RECOMMENDATIONS MADE BY THE CONSULTANT AND ANY TEST RESULTS WILL BE SENT TO YOUR REFERRING DOCTOR.   EXAM FINDINGS BY MD TODAY AND SIGNS AND SYMPTOMS TO REPORT TO CLINIC OR PRIMARY MD: You are doing well as far as your breast cancer.  You do need to call Dr. Lajoyce Corners and schedule a follow-up appointment [phone] regarding your knee and foot problems.  MEDICATIONS PRESCRIBED: none   INSTRUCTIONS GIVEN AND DISCUSSED: Other :  Report any lumps, bone pain and shortness of breath.  SPECIAL INSTRUCTIONS/FOLLOW-UP: Xray Studies Needed :Bone Density in March and Return to Clinic to see Dr. Mariel Sleet in 1 year.   I acknowledge that I have been informed and understand all the instructions given to me and received a copy. I do not have any more questions at this time, but understand that I may call the Specialty Clinic at [facility] at [phone] during business hours should I have any further questions or need assistance in obtaining follow-up care.    __________________________________________  _____________  __________ Signature of Patient or Authorized Representative            Date                   Time    __________________________________________ Nurse's Signature

## 2011-06-27 ENCOUNTER — Ambulatory Visit (HOSPITAL_COMMUNITY)
Admission: RE | Admit: 2011-06-27 | Discharge: 2011-06-27 | Disposition: A | Discharge status: Home / Self Care | Payer: Medicare Other | Source: Ambulatory Visit | Attending: Oncology | Admitting: Oncology

## 2011-06-27 DIAGNOSIS — Z139 Encounter for screening, unspecified: Secondary | ICD-10-CM

## 2011-06-27 DIAGNOSIS — Z1231 Encounter for screening mammogram for malignant neoplasm of breast: Secondary | ICD-10-CM | POA: Insufficient documentation

## 2011-07-08 ENCOUNTER — Ambulatory Visit (INDEPENDENT_AMBULATORY_CARE_PROVIDER_SITE_OTHER): Payer: Medicare Other | Admitting: Cardiovascular Disease

## 2011-07-08 ENCOUNTER — Ambulatory Visit (INDEPENDENT_AMBULATORY_CARE_PROVIDER_SITE_OTHER): Payer: Medicare Other | Admitting: *Deleted

## 2011-07-08 ENCOUNTER — Encounter: Payer: Self-pay | Admitting: Cardiovascular Disease

## 2011-07-08 DIAGNOSIS — I4891 Unspecified atrial fibrillation: Secondary | ICD-10-CM

## 2011-07-08 DIAGNOSIS — Z5181 Encounter for therapeutic drug level monitoring: Secondary | ICD-10-CM

## 2011-07-08 DIAGNOSIS — I428 Other cardiomyopathies: Secondary | ICD-10-CM

## 2011-07-08 DIAGNOSIS — Z7901 Long term (current) use of anticoagulants: Secondary | ICD-10-CM

## 2011-07-08 DIAGNOSIS — M1990 Unspecified osteoarthritis, unspecified site: Secondary | ICD-10-CM

## 2011-07-08 DIAGNOSIS — E785 Hyperlipidemia, unspecified: Secondary | ICD-10-CM

## 2011-07-08 LAB — POCT INR: INR: 2.6

## 2011-07-08 NOTE — Patient Instructions (Signed)
Your physician wants you to follow-up in:  6 MONTHS WITH DR NISHAN  You will receive a reminder letter in the mail two months in advance. If you don't receive a letter, please call our office to schedule the follow-up appointment. Your physician recommends that you continue on your current medications as directed. Please refer to the Current Medication list given to you today. 

## 2011-07-08 NOTE — Assessment & Plan Note (Signed)
Euvolemic Functional class one  Continue medical Rx

## 2011-07-08 NOTE — Assessment & Plan Note (Signed)
F/U coumadin clinic today.  Would not put on Xarelto as she is unsteady on her feet and has fallen in past.

## 2011-07-08 NOTE — Progress Notes (Signed)
Patient ID: Pamela Whitaker, female   DOB: 1933-04-12, 78 y.o.   MRN: [id number] Pamela Whitaker is a 78 -year-old female with a history of paroxysmal atrial fibrillation. She was placed on Multaq for about 6 months and maintained sinus rhythm. Multaq stopped in June of 2011. She has reduced ejection fraction at 30% with diffuse hypokinesis, very mild AS with a mean gradient of 4, mild MR, mild LAE and mod RAE and PASP 35 mmHg. Recent CHF exacerbation with BNP 900 range improved with Lasix . Her chest x-ray did show edema. Her creatinine was normal at 0.7 and hemoglobin was 15.1. TSH was normal at 3.57. Myovue with EF 31% and no ischemia or infarct. Nonishcemic DCM. Stopped amiodarone on her own. with no palpitations  Cardioverted on 10/29. Back in afib. Discussed options. Prefer rate control and anticoagulaiton.   ROS: Denies fever, malais, weight loss, blurry vision, decreased visual acuity, cough, sputum, SOB, hemoptysis, pleuritic pain, palpitaitons, heartburn, abdominal pain, melena, lower extremity edema, claudication, or rash.  All other systems reviewed and negative  General: Affect appropriate Healthy:  appears stated age HEENT: normal Neck supple with no adenopathy JVP normal no bruits no thyromegaly Lungs clear with no wheezing and good diaphragmatic motion Heart:  S1/S2 no murmur, no rub, gallop or click PMI normal Abdomen: benighn, BS positve, no tenderness, no AAA no bruit.  No HSM or HJR Distal pulses intact with no bruits No edema Neuro non-focal Skin warm and dry No muscular weakness   Current Outpatient Prescriptions  Medication Sig Dispense Refill  . amiodarone (PACERONE) 200 MG tablet Take 200 mg by mouth 2 (two) times daily.      . clonazePAM (KLONOPIN) 1 MG tablet prn      . furosemide (LASIX) 20 MG tablet Take 20 mg by mouth as needed.       Marland Kitchen losartan (COZAAR) 50 MG tablet Take 50 mg by mouth daily.        . metoprolol (LOPRESSOR) 100 MG tablet 100 MG EVERY AM 50 MG  EVERY PM  45 tablet  11  . Multiple Vitamin (MULTIVITAMIN) capsule Take 1 capsule by mouth daily.        Marland Kitchen warfarin (COUMADIN) 5 MG tablet TAKE AS DIRECTED BY ANTICOAGULATION CLINIC  30 tablet  3    Allergies  Review of patient's allergies indicates no known allergies.  Electrocardiogram:  Assessment and Plan

## 2011-07-08 NOTE — Assessment & Plan Note (Signed)
S/P injection right knee Dr Lajoyce Corners.  Clear to have knee surgery when time comes

## 2011-07-08 NOTE — Assessment & Plan Note (Signed)
Cholesterol is at goal.  Continue current dose of statin and diet Rx.  No myalgias or side effects.  F/U  LFT's in 6 months. No results found for this basename: LDLCALC             

## 2011-07-08 NOTE — Assessment & Plan Note (Signed)
Good rate control and anticoagulation with failed [facility] x2

## 2011-07-19 ENCOUNTER — Other Ambulatory Visit: Payer: Self-pay | Admitting: Cardiovascular Disease

## 2011-08-04 ENCOUNTER — Ambulatory Visit (HOSPITAL_COMMUNITY)
Admission: RE | Admit: 2011-08-04 | Discharge: 2011-08-04 | Disposition: A | Discharge status: Home / Self Care | Payer: Medicare Other | Source: Ambulatory Visit | Attending: Oncology | Admitting: Oncology

## 2011-08-04 DIAGNOSIS — C50919 Malignant neoplasm of unspecified site of unspecified female breast: Secondary | ICD-10-CM | POA: Insufficient documentation

## 2011-08-04 DIAGNOSIS — Z780 Asymptomatic menopausal state: Secondary | ICD-10-CM | POA: Insufficient documentation

## 2011-08-04 DIAGNOSIS — M899 Disorder of bone, unspecified: Secondary | ICD-10-CM | POA: Insufficient documentation

## 2011-08-19 ENCOUNTER — Ambulatory Visit (INDEPENDENT_AMBULATORY_CARE_PROVIDER_SITE_OTHER): Payer: Medicare Other

## 2011-08-19 DIAGNOSIS — I4891 Unspecified atrial fibrillation: Secondary | ICD-10-CM

## 2011-08-19 LAB — POCT INR: INR: 2.6

## 2011-08-25 LAB — HM MAMMOGRAPHY: HM Mammogram: NORMAL

## 2011-09-30 ENCOUNTER — Ambulatory Visit (INDEPENDENT_AMBULATORY_CARE_PROVIDER_SITE_OTHER): Payer: Medicare Other | Admitting: Pharmacist

## 2011-09-30 DIAGNOSIS — I4891 Unspecified atrial fibrillation: Secondary | ICD-10-CM

## 2011-09-30 LAB — POCT INR: INR: 2.9

## 2011-10-16 ENCOUNTER — Encounter: Payer: Self-pay | Admitting: Oncology

## 2011-11-11 ENCOUNTER — Ambulatory Visit (INDEPENDENT_AMBULATORY_CARE_PROVIDER_SITE_OTHER): Payer: Medicare Other | Admitting: *Deleted

## 2011-11-11 DIAGNOSIS — I4891 Unspecified atrial fibrillation: Secondary | ICD-10-CM

## 2011-11-11 LAB — POCT INR: INR: 3.8

## 2011-12-02 ENCOUNTER — Ambulatory Visit (INDEPENDENT_AMBULATORY_CARE_PROVIDER_SITE_OTHER): Payer: Medicare Other | Admitting: *Deleted

## 2011-12-02 DIAGNOSIS — I4891 Unspecified atrial fibrillation: Secondary | ICD-10-CM

## 2011-12-02 MED ORDER — WARFARIN SODIUM 5 MG PO TABS: ORAL_TABLET | ORAL | Status: DC

## 2011-12-12 ENCOUNTER — Ambulatory Visit (INDEPENDENT_AMBULATORY_CARE_PROVIDER_SITE_OTHER): Payer: Medicare Other | Admitting: *Deleted

## 2011-12-12 DIAGNOSIS — I4891 Unspecified atrial fibrillation: Secondary | ICD-10-CM

## 2011-12-26 ENCOUNTER — Ambulatory Visit (INDEPENDENT_AMBULATORY_CARE_PROVIDER_SITE_OTHER): Payer: Medicare Other

## 2011-12-26 DIAGNOSIS — I4891 Unspecified atrial fibrillation: Secondary | ICD-10-CM

## 2012-01-06 ENCOUNTER — Ambulatory Visit (INDEPENDENT_AMBULATORY_CARE_PROVIDER_SITE_OTHER): Payer: Medicare Other | Admitting: Cardiovascular Disease

## 2012-01-06 ENCOUNTER — Encounter: Payer: Self-pay | Admitting: Cardiovascular Disease

## 2012-01-06 ENCOUNTER — Ambulatory Visit (INDEPENDENT_AMBULATORY_CARE_PROVIDER_SITE_OTHER): Payer: Medicare Other | Admitting: Pharmacist

## 2012-01-06 VITALS — BP 147/90 | HR 57 | Ht 65.0 in | Wt 156.0 lb

## 2012-01-06 DIAGNOSIS — I4891 Unspecified atrial fibrillation: Secondary | ICD-10-CM

## 2012-01-06 DIAGNOSIS — I428 Other cardiomyopathies: Secondary | ICD-10-CM

## 2012-01-06 DIAGNOSIS — E785 Hyperlipidemia, unspecified: Secondary | ICD-10-CM

## 2012-01-06 NOTE — Progress Notes (Signed)
   Patient ID: Pamela Whitaker, female    DOB: 1932-10-04, 78 y.o.   MRN: [id number]  Shawan Tosh is a 67 -year-old female with a history of paroxysmal atrial fibrillation. She was placed on Multaq for about 6 months and maintained sinus rhythm. Multaq stopped in June of 2011. She has reduced ejection fraction at 30% with diffuse hypokinesis, very mild AS with a mean gradient of 4, mild MR, mild LAE and mod RAE and PASP 35 mmHg. Recent CHF exacerbation with BNP 900 range improved with Lasix . Her chest x-ray did show edema. Her creatinine was normal at 0.7 and hemoglobin was 15.1. TSH was normal at 3.57. Myovue with EF 31% and no ischemia or infarct. Nonishcemic DCM. Stopped amiodarone on her own. with no palpitations  Cardioverted on 10/29. Back in afib. Discussed options. Prefer rate control and anticoagulaiton.    Current outpatient prescriptions:atorvastatin (LIPITOR) 80 MG tablet, Take 80 mg by mouth every other day., Disp: , Rfl: ;  furosemide (LASIX) 20 MG tablet, Take 20 mg by mouth as needed. , Disp: , Rfl: ;  losartan (COZAAR) 50 MG tablet, TAKE 1 TABLET BY MOUTH DAILY, Disp: 30 tablet, Rfl: 9;  metoprolol (LOPRESSOR) 100 MG tablet, 100 MG EVERY AM 50 MG EVERY PM, Disp: 45 tablet, Rfl: 11 Multiple Vitamin (MULTIVITAMIN) capsule, Take 1 capsule by mouth daily.  , Disp: , Rfl: ;  nabumetone (RELAFEN) 750 MG tablet, Take 750 mg by mouth 2 (two) times daily as needed. Take as needed for pain and inflammation pt states she is not taking this med as much now, Disp: , Rfl: ;  warfarin (COUMADIN) 5 MG tablet, Take as directed  By coumadin clinic, Disp: 20 tablet, Rfl: 3  Review of Systems  All other systems reviewed and are negative.   Review of Systems  All other systems reviewed and are negative.      Physical Exam Affect appropriate Healthy:  appears stated age HEENT: normal Neck supple with no adenopathy JVP normal no bruits no thyromegaly Lungs clear with no wheezing and good  diaphragmatic motion Heart:  S1/S2 no murmur, no rub, gallop or click PMI normal Abdomen: benighn, BS positve, no tenderness, no AAA no bruit.  No HSM or HJR Distal pulses intact with no bruits No edema Neuro non-focal Skin warm and dry No muscular weakness

## 2012-01-06 NOTE — Patient Instructions (Signed)
Your physician wants you to follow-up in:  6 MONTHS WITH DR NISHAN  You will receive a reminder letter in the mail two months in advance. If you don't receive a letter, please call our office to schedule the follow-up appointment. Your physician recommends that you continue on your current medications as directed. Please refer to the Current Medication list given to you today. 

## 2012-01-06 NOTE — Assessment & Plan Note (Signed)
euvolemic continue current meds 

## 2012-01-06 NOTE — Assessment & Plan Note (Signed)
Cholesterol is at goal.  Continue current dose of statin and diet Rx.  No myalgias or side effects.  F/U  LFT's in 6 months. No results found for this basename: LDLCALC             

## 2012-01-06 NOTE — Assessment & Plan Note (Signed)
Good rate contorl  INR high and dose decreased  F/U INR today

## 2012-01-27 ENCOUNTER — Ambulatory Visit (INDEPENDENT_AMBULATORY_CARE_PROVIDER_SITE_OTHER): Payer: Medicare Other | Admitting: *Deleted

## 2012-01-27 DIAGNOSIS — I4891 Unspecified atrial fibrillation: Secondary | ICD-10-CM

## 2012-02-24 ENCOUNTER — Ambulatory Visit (INDEPENDENT_AMBULATORY_CARE_PROVIDER_SITE_OTHER): Payer: Medicare Other | Admitting: *Deleted

## 2012-02-24 DIAGNOSIS — I4891 Unspecified atrial fibrillation: Secondary | ICD-10-CM

## 2012-02-24 LAB — POCT INR: INR: 1.8

## 2012-03-23 ENCOUNTER — Ambulatory Visit (INDEPENDENT_AMBULATORY_CARE_PROVIDER_SITE_OTHER): Payer: Medicare Other | Admitting: Pharmacist

## 2012-03-23 DIAGNOSIS — I4891 Unspecified atrial fibrillation: Secondary | ICD-10-CM

## 2012-03-23 LAB — POCT INR: INR: 2

## 2012-04-18 ENCOUNTER — Encounter: Payer: Self-pay | Admitting: Cardiovascular Disease

## 2012-04-19 ENCOUNTER — Telehealth: Payer: Self-pay | Admitting: *Deleted

## 2012-04-19 ENCOUNTER — Ambulatory Visit (INDEPENDENT_AMBULATORY_CARE_PROVIDER_SITE_OTHER): Payer: Medicare Other

## 2012-04-19 ENCOUNTER — Ambulatory Visit (INDEPENDENT_AMBULATORY_CARE_PROVIDER_SITE_OTHER): Payer: Medicare Other | Admitting: Nurse Practitioner

## 2012-04-19 ENCOUNTER — Encounter (INDEPENDENT_AMBULATORY_CARE_PROVIDER_SITE_OTHER): Payer: Medicare Other

## 2012-04-19 ENCOUNTER — Encounter: Payer: Self-pay | Admitting: Nurse Practitioner

## 2012-04-19 ENCOUNTER — Ambulatory Visit (INDEPENDENT_AMBULATORY_CARE_PROVIDER_SITE_OTHER)
Admission: RE | Admit: 2012-04-19 | Discharge: 2012-04-19 | Disposition: A | Discharge status: Home / Self Care | Payer: Medicare Other | Source: Ambulatory Visit | Attending: Nurse Practitioner | Admitting: Nurse Practitioner

## 2012-04-19 VITALS — BP 120/76 | HR 64 | Ht 63.75 in | Wt 153.8 lb

## 2012-04-19 DIAGNOSIS — I5022 Chronic systolic (congestive) heart failure: Secondary | ICD-10-CM

## 2012-04-19 DIAGNOSIS — W19XXXA Unspecified fall, initial encounter: Secondary | ICD-10-CM

## 2012-04-19 DIAGNOSIS — I498 Other specified cardiac arrhythmias: Secondary | ICD-10-CM

## 2012-04-19 DIAGNOSIS — Z79899 Other long term (current) drug therapy: Secondary | ICD-10-CM

## 2012-04-19 DIAGNOSIS — I4891 Unspecified atrial fibrillation: Secondary | ICD-10-CM

## 2012-04-19 LAB — POCT INR: INR: 2.4

## 2012-04-19 NOTE — Patient Instructions (Addendum)
You need to see the eye doctor  We will try to get your lab work from Dr. Kathryne Sharper office  You need to use a walker from now on and not a cane. If you continue to fall, we will need to stop your blood thinner  We need to do a scan on your head  Stay on your current medicines  We are going to update your ultrasound of your heart  I need to see you in about 2 weeks  Call the [facility] office at [phone] if you have any questions, problems or concerns.

## 2012-04-19 NOTE — Telephone Encounter (Signed)
REBECCA FROM  DR  MCKEOWN'S OFFICE CALLED  PT THERE  FOR PHYSICAL   C/O OF INCREASED SOB  WANTED PT TO BE SEEN TODAY  APPT MADE WITH LORI GERHARDT  NP FOR 2:00 PM .Pamela Whitaker

## 2012-04-19 NOTE — Progress Notes (Signed)
Pamela Whitaker Date of Birth: 1933/01/15 Medical Record #[id number]  History of Present Illness: [name] is seen back today for a work in visit. She is seen for Dr. Eden Emms. She is a 78 year old female with a history of PAF. Has been on Multaq in the past and it was stopped in June of 2011 after about 6 months of therapy. She has had prior cardioversion but reverted back to atrial fib. Stopped amiodarone on her own. Preferred rate control and anticoagulation with coumadin. She has had a reduced EF of 30% per prior echo.   Last seen here in August and was felt to be doing ok.   She is seen today. She is here alone. She was at her PCP's office earlier today for her routine physical. Says that "something" was seen on her EKG and she was told to come here. EKG showed atrial fib with frequent PVC's. She has multiple complaints. Her vision is blurry. Says it is like a "cloud" is over her eyes. Does have some cataracts. Has been almost a year since she went to the eye doctor. She feels more breathless. She is more fatigued. No real chest pain. Has had some back pain. Feels more clumsey. Has fallen multiple times and says she has had too numerous to count over the past 6 months. She has not wanted to use a walker. No passing out. Says she just gets tripped up or loses her balance. Has hit her head recently. Admits that her memory is not as good. Occasionally misses her evening dose of Metoprolol and missed last night's dose and had not had any of her medicines before her visit at Dr. Kathryne Sharper earlier today. Her weight is ok. Has actually lost 3 pounds. NO swelling. No cough.   Current Outpatient Prescriptions on File Prior to Visit  Medication Sig Dispense Refill  . clonazePAM (KLONOPIN) 1 MG tablet Take 1 mg by mouth at bedtime as needed.       . furosemide (LASIX) 20 MG tablet Take 20 mg by mouth as needed.       Marland Kitchen losartan (COZAAR) 50 MG tablet TAKE 1 TABLET BY MOUTH DAILY  30 tablet  9  .  metoprolol (LOPRESSOR) 100 MG tablet 100 MG EVERY AM 50 MG EVERY PM  45 tablet  11  . Multiple Vitamin (MULTIVITAMIN) capsule Take 1 capsule by mouth daily.        . nabumetone (RELAFEN) 750 MG tablet Take 750 mg by mouth 2 (two) times daily as needed. Take as needed for pain and inflammation pt states she is not taking this med as much now      . warfarin (COUMADIN) 5 MG tablet Take as directed  By coumadin clinic  20 tablet  3  . atorvastatin (LIPITOR) 80 MG tablet Take 80 mg by mouth every other day.        No Known Allergies  Past Medical History  Diagnosis Date  . Atrial fibrillation     treated with multaq x 6 mos in 2011....[name]=2 (Age; + CHF; no CVA; no DM2). Echo 05/24/10: EF 30% mild AS(mean ); mild LAE; mod LAE; PASP 35 Systolic CHF  . Cardiomyopathy     a. tachy mediated Myoview 06/06/10:  EF 31%; no scar or ischemia  . Breast cancer      in 1987 with a left  mastectomy  . Osteoporosis     history of bone chips in the right knee,previous right fibular fracture, operated  on by Dr. Ranell Patrick  . RLS (restless legs syndrome)   . Lumbar stenosis   . Hypercholesteremia   . DJD (degenerative joint disease) of knee     right knee    Past Surgical History  Procedure Date  . Knee arthroscopy 2005  . Mastectomy, radical 1987    left breast  . Breast reconstruction 1988    left breast  . Wrist fracture surgery     left wrist    History  Smoking status  . Never Smoker   Smokeless tobacco  . Never Used    History  Alcohol Use No    Family History  Problem Relation Age of Onset  . Heart attack Father   . Aortic aneurysm Mother   . Cancer Sister     breast cancer  . Diabetes Sister     Review of Systems: The review of systems is per the HPI.  All other systems were reviewed and are negative.  Physical Exam: BP 120/76  Pulse 64  Ht 5' 3.75" (1.619 m)  Wt 153 lb 12.8 oz (69.763 kg)  BMI 26.61 kg/m2 Patient is very pleasant and in no acute distress.  Skin is warm and dry. Color is normal.  HEENT is unremarkable. Normocephalic/atraumatic. PERRL. Sclera are nonicteric. Neck is supple. No masses. No JVD. Lungs are clear. Cardiac exam shows an irregular rhythm. Rate is controlled. Abdomen is soft. Extremities are without edema. Gait is very unsteady. She looks as if she has bilateral deformities of her feet. No gross neurologic deficits noted.  LABORATORY DATA: EKG from her PCP shows atrial fib with frequent PVC's.   Lab Results  Component Value Date   WBC 7.5 03/24/2011   HGB 13.1 03/24/2011   HCT 38.1 03/24/2011   PLT 168 03/24/2011   GLUCOSE 96 03/24/2011   ALT 15 07/02/2009   AST 21 07/02/2009   NA 139 03/24/2011   K 3.9 03/24/2011   CL 105 03/24/2011   CREATININE 0.70 03/24/2011   BUN 11 03/24/2011   CO2 27 03/24/2011   TSH 3.57 05/29/2010   INR 2.4 04/19/2012   Echo Study Conclusions from April 2012  - Left ventricle: The cavity size was normal. Wall thickness was normal. Systolic function was moderately reduced. The estimated ejection fraction was in the range of 35% to 40%. Diffuse hypokinesis. - Aortic valve: There was very mild stenosis. Mild regurgitation. - Mitral valve: Mild regurgitation. - Left atrium: The atrium was mildly dilated. - Right ventricle: The cavity size was mildly dilated. - Right atrium: The atrium was mildly dilated. - Atrial septum: There was a patent foramen ovale. - Tricuspid valve: Moderate regurgitation. - Pulmonary arteries: Systolic pressure was mildly increased. PA peak pressure: 40mm Hg (S).   Assessment / Plan:  1. Abnormal EKG - I assume there was concern for the PVC's. She has chronic atrial fib and is managed with rate control and anticoagulation. She missed last nights medicine and had not had dose this morning prior to her visit at the PCP's office. She did take her dose later today. Still has lots of ectopy on exam. Will assess burden with a 24 hour Holter.  2. Falls - Will check CT  of the head. She is to stop using her cane and use her walker instead. If her falls persist, we will need to stop her anticoagulation due to increased risk of bleeding.   3. Unsteady gait/memory issues - seems like she may have dementia. May need to refer  to neurology. She says that was mentioned at her visit with the PCP earlier today as well.   4. Chronic systolic heart failure - weight is down. She is feeling more dyspneic. Will recheck an echo especially in light of the increased frequency of PVC's now noted.   We will arrange for the above studies. Try to get her labs from her PCP's office to review. I will see her back in about 2 weeks. INR will be checked today as well.   Patient is agreeable to this plan and will call if any problems develop in the interim.

## 2012-04-21 ENCOUNTER — Ambulatory Visit (HOSPITAL_COMMUNITY): Payer: Medicare Other | Attending: Cardiovascular Disease

## 2012-04-21 DIAGNOSIS — I369 Nonrheumatic tricuspid valve disorder, unspecified: Secondary | ICD-10-CM | POA: Insufficient documentation

## 2012-04-21 DIAGNOSIS — I4891 Unspecified atrial fibrillation: Secondary | ICD-10-CM | POA: Insufficient documentation

## 2012-04-21 DIAGNOSIS — I5022 Chronic systolic (congestive) heart failure: Secondary | ICD-10-CM

## 2012-04-21 DIAGNOSIS — Z79899 Other long term (current) drug therapy: Secondary | ICD-10-CM

## 2012-04-21 DIAGNOSIS — I509 Heart failure, unspecified: Secondary | ICD-10-CM | POA: Insufficient documentation

## 2012-04-21 DIAGNOSIS — W19XXXA Unspecified fall, initial encounter: Secondary | ICD-10-CM

## 2012-04-21 DIAGNOSIS — I080 Rheumatic disorders of both mitral and aortic valves: Secondary | ICD-10-CM | POA: Insufficient documentation

## 2012-04-21 NOTE — Progress Notes (Signed)
Echocardiogram performed.  

## 2012-05-03 ENCOUNTER — Ambulatory Visit (INDEPENDENT_AMBULATORY_CARE_PROVIDER_SITE_OTHER): Payer: Medicare Other | Admitting: Nurse Practitioner

## 2012-05-03 ENCOUNTER — Encounter: Payer: Self-pay | Admitting: Nurse Practitioner

## 2012-05-03 VITALS — BP 120/74 | HR 52 | Ht 65.0 in | Wt 157.8 lb

## 2012-05-03 DIAGNOSIS — I4891 Unspecified atrial fibrillation: Secondary | ICD-10-CM

## 2012-05-03 DIAGNOSIS — W19XXXA Unspecified fall, initial encounter: Secondary | ICD-10-CM

## 2012-05-03 NOTE — Patient Instructions (Addendum)
The ultrasound of your heart showed improvement in your pumping function.  You need to be using a walker and not your cane to help with your walking and prevent falling  See Dr. Eden Emms in 2 months (February)  Try to remember your evening dose of your metoprolol. This helps your heart rate not go fast.  Stay on your current medicines  Call the [facility] office at [phone] if you have any questions, problems or concerns.

## 2012-05-03 NOTE — Progress Notes (Signed)
Pamela Whitaker Date of Birth: [birth year] Medical Record #[id number]  History of Present Illness: Pamela Whitaker is seen back today for a 2 week check. She is seen for Dr. Eden Emms. She has a history of PAF. No longer on Multaq. Stopped amiodarone in the past as well. Managed with rate control and anticoagulation with coumadin. She has had a reduced EF of 30% in the past.  I saw her 2 weeks ago after she had been to her PCP. EKG there showed atrial fib with frequent PVC's. She has missed some doses of her Metoprolol. Falling. We checked a CT scan of her head. She remains on Coumadin. We updated her echo which shows improvement and placed a Holter.   She comes back today. She is here with her daughter. She says she has not fallen since she was last here. Still very unsteady due to this deformity of her feet.  Still forgets her evening dose of Metoprolol. Still just using a cane and not a walker as advised. Has multiple questions about her rhythm. Does not understand the reason for the way her atrial fib is managed. She is now thinking about having a knee replacement. She will get a little breathless at times. Not dizzy. No passing out. She can feel her heart race at times.   Current Outpatient Prescriptions on File Prior to Visit  Medication Sig Dispense Refill  . clonazePAM (KLONOPIN) 1 MG tablet Take 1 mg by mouth at bedtime as needed.       Marland Kitchen losartan (COZAAR) 50 MG tablet TAKE 1 TABLET BY MOUTH DAILY  30 tablet  9  . metoprolol (LOPRESSOR) 100 MG tablet 100 MG EVERY AM 50 MG EVERY PM  45 tablet  11  . Multiple Vitamin (MULTIVITAMIN) capsule Take 1 capsule by mouth daily.        . nabumetone (RELAFEN) 750 MG tablet Take 750 mg by mouth 2 (two) times daily as needed. Take as needed for pain and inflammation pt states she is not taking this med as much now      . warfarin (COUMADIN) 5 MG tablet Take as directed  By coumadin clinic  20 tablet  3    No Known Allergies  Past Medical History    Diagnosis Date  . Atrial fibrillation     treated with multaq x 6 mos in 2011....[name]=2 (Age; + CHF; no CVA; no DM2). Echo 05/24/10: EF 30% mild AS(mean ); mild LAE; mod LAE; PASP 35 Systolic CHF  . Cardiomyopathy     a. tachy mediated Myoview 06/06/10:  EF 31%; no scar or ischemia  . Breast cancer      in 1987 with a left  mastectomy  . Osteoporosis     history of bone chips in the right knee,previous right fibular fracture, operated on by Dr. Ranell Patrick  . RLS (restless legs syndrome)   . Lumbar stenosis   . Hypercholesteremia   . DJD (degenerative joint disease) of knee     right knee    Past Surgical History  Procedure Date  . Knee arthroscopy 2005  . Mastectomy, radical 1987    left breast  . Breast reconstruction 1988    left breast  . Wrist fracture surgery     left wrist    History  Smoking status  . Never Smoker   Smokeless tobacco  . Never Used    History  Alcohol Use No    Family History  Problem Relation Age of Onset  .  Heart attack Father   . Aortic aneurysm Mother   . Cancer Sister     breast cancer  . Diabetes Sister     Review of Systems: The review of systems is per the HPI.  All other systems were reviewed and are negative.  Physical Exam: BP 120/74  Pulse 52  Ht 5\' 5"  (1.651 m)  Wt 157 lb 12.8 oz (71.578 kg)  BMI 26.26 kg/m2 Patient is very pleasant and in no acute distress. She remains quite unsteady on her feet. Skin is warm and dry. Color is normal.  HEENT is unremarkable. Normocephalic/atraumatic. PERRL. Sclera are nonicteric. Neck is supple. No masses. No JVD. Lungs are clear. Cardiac exam shows a regular rate and rhythm. Abdomen is soft. Extremities are without edema. Gait and ROM are intact. No gross neurologic deficits noted.   LABORATORY DATA:  Echo Study Conclusions  - Left ventricle: The cavity size was normal. Wall thickness was normal. Systolic function was normal. The estimated ejection fraction was in the range  of 50% to 55%. Wall motion was normal; there were no regional wall motion abnormalities. - Aortic valve: There was very mild stenosis. Mild regurgitation. - Mitral valve: Mild regurgitation. - Atrial septum: No defect or patent foramen ovale was identified. - Tricuspid valve: Moderate regurgitation.   Ct Head Wo Contrast 04/19/2012   IMPRESSION: No acute abnormality.    Original Report Authenticated By: Pamela Whitaker, M.D.    24 Holter She had an average heart rate of 81. She did have low rate of 50 and a high rate of 145. Was in chronic atrial fib. Longest pause was 2.09 seconds at 4am. Frequent PVC's noted.   Assessment / Plan:  1. Chronic atrial fib - I have explained that her management involves rate control and anticoagulation. She has not tolerated or stopped prior antiarrhythmics and has been in chronic atrial fib for some time. EF has improved. I think we would see better rate control if she were taking her medicines as prescribed. We talked about changing to Metoprolol Succinate, but she refused. I have asked her daughter to help with reminders. If her falls continue, she will have to stop the coumadin.   2. PVC's - on metoprolol - I think we would see more suppression if she were taking her medicine as prescribed.   3. History of systolic heart failure with past EF down in the 30% range but improved on recent study. Results are reviewed.   4. Falls/unsteadiness - she is thinking about a knee replacement but I cautioned her that it looks like her feet deformity is what is hindering her gait. I once again encouraged her to use a walker.   I will have her see Dr. Eden Emms in a couple of months.   Patient is agreeable to this plan and will call if any problems develop in the interim.

## 2012-05-13 ENCOUNTER — Other Ambulatory Visit: Payer: Self-pay | Admitting: *Deleted

## 2012-05-13 MED ORDER — WARFARIN SODIUM 5 MG PO TABS: ORAL_TABLET | ORAL | Status: DC

## 2012-05-17 ENCOUNTER — Ambulatory Visit (INDEPENDENT_AMBULATORY_CARE_PROVIDER_SITE_OTHER): Payer: Medicare Other | Admitting: *Deleted

## 2012-05-17 DIAGNOSIS — I4891 Unspecified atrial fibrillation: Secondary | ICD-10-CM

## 2012-06-09 ENCOUNTER — Other Ambulatory Visit (HOSPITAL_COMMUNITY): Payer: Self-pay | Admitting: Oncology

## 2012-06-09 DIAGNOSIS — Z139 Encounter for screening, unspecified: Secondary | ICD-10-CM

## 2012-06-23 ENCOUNTER — Ambulatory Visit (HOSPITAL_COMMUNITY): Payer: Medicare Other | Admitting: Oncology

## 2012-06-29 ENCOUNTER — Encounter (HOSPITAL_COMMUNITY): Payer: Self-pay | Admitting: Oncology

## 2012-06-29 ENCOUNTER — Ambulatory Visit (HOSPITAL_COMMUNITY)
Admission: RE | Admit: 2012-06-29 | Discharge: 2012-06-29 | Disposition: A | Discharge status: Home / Self Care | Payer: Medicare Other | Source: Ambulatory Visit | Attending: Oncology | Admitting: Oncology

## 2012-06-29 ENCOUNTER — Encounter (HOSPITAL_COMMUNITY): Payer: Medicare Other | Attending: Oncology | Admitting: Oncology

## 2012-06-29 VITALS — BP 145/85 | HR 108 | Temp 97.3°F | Resp 20 | Wt 155.0 lb

## 2012-06-29 DIAGNOSIS — I4891 Unspecified atrial fibrillation: Secondary | ICD-10-CM

## 2012-06-29 DIAGNOSIS — Z09 Encounter for follow-up examination after completed treatment for conditions other than malignant neoplasm: Secondary | ICD-10-CM | POA: Insufficient documentation

## 2012-06-29 DIAGNOSIS — M1710 Unilateral primary osteoarthritis, unspecified knee: Secondary | ICD-10-CM | POA: Insufficient documentation

## 2012-06-29 DIAGNOSIS — Z139 Encounter for screening, unspecified: Secondary | ICD-10-CM

## 2012-06-29 DIAGNOSIS — M899 Disorder of bone, unspecified: Secondary | ICD-10-CM

## 2012-06-29 DIAGNOSIS — Z853 Personal history of malignant neoplasm of breast: Secondary | ICD-10-CM | POA: Insufficient documentation

## 2012-06-29 DIAGNOSIS — Z1231 Encounter for screening mammogram for malignant neoplasm of breast: Secondary | ICD-10-CM | POA: Insufficient documentation

## 2012-06-29 DIAGNOSIS — E7800 Pure hypercholesterolemia, unspecified: Secondary | ICD-10-CM | POA: Insufficient documentation

## 2012-06-29 DIAGNOSIS — C50919 Malignant neoplasm of unspecified site of unspecified female breast: Secondary | ICD-10-CM

## 2012-06-29 DIAGNOSIS — R413 Other amnesia: Secondary | ICD-10-CM | POA: Insufficient documentation

## 2012-06-29 LAB — COMPREHENSIVE METABOLIC PANEL
Albumin: 3.8 g/dL (ref 3.5–5.2)
BUN: 11 mg/dL (ref 6–23)
Creatinine, Ser: 0.65 mg/dL (ref 0.50–1.10)
Total Protein: 7.5 g/dL (ref 6.0–8.3)

## 2012-06-29 LAB — CBC WITH DIFFERENTIAL/PLATELET
Basophils Relative: 0 % (ref 0–1)
Eosinophils Absolute: 0.1 10*3/uL (ref 0.0–0.7)
Hemoglobin: 13.9 g/dL (ref 12.0–15.0)
MCHC: 33.5 g/dL (ref 30.0–36.0)
MCV: 93.3 fL (ref 78.0–100.0)
Monocytes Absolute: 0.4 10*3/uL (ref 0.1–1.0)
Monocytes Relative: 6 % (ref 3–12)
Neutrophils Relative %: 69 % (ref 43–77)
RBC: 4.45 MIL/uL (ref 3.87–5.11)
RDW: 12.6 % (ref 11.5–15.5)

## 2012-06-29 LAB — TSH: TSH: 2.745 u[IU]/mL (ref 0.350–4.500)

## 2012-06-29 LAB — VITAMIN B12: Vitamin B-12: 673 pg/mL (ref 211–911)

## 2012-06-29 NOTE — Patient Instructions (Addendum)
.  [facility] Discharge Instructions  RECOMMENDATIONS MADE BY THE CONSULTANT AND ANY TEST RESULTS WILL BE SENT TO YOUR REFERRING PHYSICIAN.  EXAM FINDINGS BY THE PHYSICIAN TODAY AND SIGNS OR SYMPTOMS TO REPORT TO CLINIC OR PRIMARY PHYSICIAN: Exam and labs today     SPECIAL INSTRUCTIONS/FOLLOW-UP: Return in 1 year  Thank you for choosing [facility] to provide your oncology and hematology care.  To afford each patient quality time with our providers, please arrive at least 15 minutes before your scheduled appointment time.  With your help, our goal is to use those 15 minutes to complete the necessary work-up to ensure our physicians have the information they need to help with your evaluation and healthcare recommendations.    Effective January 1st, 2014, we ask that you re-schedule your appointment with our physicians should you arrive 10 or more minutes late for your appointment.  We strive to give you quality time with our providers, and arriving late affects you and other patients whose appointments are after yours.    Again, thank you for choosing [facility].  Our hope is that these requests will decrease the amount of time that you wait before being seen by our physicians.       _____________________________________________________________  Should you have questions after your visit to [facility], please contact our office at [phone] between the hours of 8:30 a.m. and 5:00 p.m.  Voicemails left after 4:30 p.m. will not be returned until the following business day.  For prescription refill requests, have your pharmacy contact our office with your prescription refill request.

## 2012-06-29 NOTE — Progress Notes (Signed)
Stage III adenocarcinoma of the breast surgery on April 10, 1986 consisting of a left mastectomy with reconstruction by Dr. Doree Fudge is for 6.5 cm cancer with 2 of 27 positive lymph nodes status post CAF VP chemotherapy. She took chemotherapy for a year followed by hormonal therapy for 12 months but was stopped due to intolerance. At that time, we had tried Megace and tamoxifen and she opted not to try anything further. She's remained disease-free since then. Problem #2 memory issues with mild memory loss Problem #3 degenerative joint disease of the knees right greater than left presently not having a right knee replacement yet. Problem #4 atrial fibrillation on therapy Problem #5 hypercholesterolemia Problem #6 osteopenia  Pamela Whitaker is here for routine followup. She is doing well as best she can but is more forgetful than she was last year. She is now 64. She is not aware of anything new or different other than the right knee pain is more significant at times. She tells me that her orthopedic physicians do not want to operate on her just yet. She is somewhat unsteady of gait and is still on Coumadin. Oncology review of systems otherwise is negative but she had many questions about her husband and herself. She is very worried about her husbands liver which sounds like it may be diseased.  BP 145/85  Pulse 108  Temp 97.3 F (36.3 C) (Oral)  Resp 20  Wt 155 lb (70.308 kg)  She herself is in no acute distress but her memory is definitely more difficult for her. She does where she is, who I am,Etc. She is alert and oriented. She has no lymphadenopathy. She has no leg edema. Lungs are clear. Left reconstructed breast is negative for masses. The right breast is negative for masses. Heart shows an irregular rhythm but normal rate without obvious murmur rub or gallop. Abdomen remains soft and nontender without organomegaly. Facial symmetry appears intact.  I do want to check her vitamin levels, TSH,  liver enzymes and CBC and differential. If all is well we will see her in 12 months.

## 2012-07-01 ENCOUNTER — Ambulatory Visit (INDEPENDENT_AMBULATORY_CARE_PROVIDER_SITE_OTHER): Payer: Medicare Other | Admitting: Cardiovascular Disease

## 2012-07-01 ENCOUNTER — Ambulatory Visit (INDEPENDENT_AMBULATORY_CARE_PROVIDER_SITE_OTHER): Payer: Medicare Other | Admitting: *Deleted

## 2012-07-01 ENCOUNTER — Encounter: Payer: Self-pay | Admitting: Cardiovascular Disease

## 2012-07-01 VITALS — BP 144/82 | HR 85 | Ht 65.0 in | Wt 155.8 lb

## 2012-07-01 DIAGNOSIS — I428 Other cardiomyopathies: Secondary | ICD-10-CM

## 2012-07-01 DIAGNOSIS — I4891 Unspecified atrial fibrillation: Secondary | ICD-10-CM

## 2012-07-01 DIAGNOSIS — E785 Hyperlipidemia, unspecified: Secondary | ICD-10-CM

## 2012-07-01 LAB — POCT INR: INR: 2.8

## 2012-07-01 LAB — FOLATE RBC: RBC Folate: 1761 ng/mL — ABNORMAL HIGH (ref >=366)

## 2012-07-01 NOTE — Progress Notes (Signed)
Patient ID: Pamela Whitaker, female   DOB: 03/18/1933, 79 y.o.   MRN: [id number] Pamela Whitaker is a 79 -year-old female with a history of paroxysmal atrial fibrillation. She was placed on Multaq for about 6 months and maintained sinus rhythm. Multaq stopped in June of 2011. She has reduced ejection fraction at 30% with diffuse hypokinesis, very mild AS with a mean gradient of 4, mild MR, mild LAE and mod RAE and PASP 35 mmHg. Recent CHF exacerbation with BNP 900 range improved with Lasix . Her chest x-ray did show edema. Her creatinine was normal at 0.7 and hemoglobin was 15.1. TSH was normal at 3.57. Myovue with EF 31% and no ischemia or infarct. Nonishcemic DCM. Stopped amiodarone on her own. with no palpitations  Cardioverted on 10/29. Back in afib. Discussed options. Prefer rate control and anticoagulaiton.   Under stress as husband has cryptogenic cirrhosis  She needs Rgith TKR but cant because she has no help with her husband From a cardiac perspective she would be fine to stop coumadin and have surgery   ROS: Denies fever, malais, weight loss, blurry vision, decreased visual acuity, cough, sputum, SOB, hemoptysis, pleuritic pain, palpitaitons, heartburn, abdominal pain, melena, lower extremity edema, claudication, or rash.  All other systems reviewed and negative  General: Affect appropriate Healthy:  appears stated age HEENT: normal Neck supple with no adenopathy JVP normal no bruits no thyromegaly Lungs clear with no wheezing and good diaphragmatic motion Heart:  S1/S2 no murmur, no rub, gallop or click PMI normal Abdomen: benighn, BS positve, no tenderness, no AAA no bruit.  No HSM or HJR Distal pulses intact with no bruits No edema Neuro non-focal Skin warm and dry No muscular weakness   Current Outpatient Prescriptions  Medication Sig Dispense Refill  . clonazePAM (KLONOPIN) 1 MG tablet Take 1 mg by mouth at bedtime as needed.       Marland Kitchen losartan (COZAAR) 50 MG tablet TAKE 1  TABLET BY MOUTH DAILY  30 tablet  9  . metoprolol (LOPRESSOR) 100 MG tablet 100 MG EVERY AM 50 MG EVERY PM  45 tablet  11  . Multiple Vitamin (MULTIVITAMIN) capsule Take 1 capsule by mouth daily.        . nabumetone (RELAFEN) 750 MG tablet Take 750 mg by mouth 2 (two) times daily as needed. Take as needed for pain and inflammation pt states she is not taking this med as much now      . pravastatin (PRAVACHOL) 40 MG tablet Take 40 mg by mouth daily.       Marland Kitchen warfarin (COUMADIN) 5 MG tablet Take 2.5 mg by mouth daily. Take as directed  By coumadin clinic        Allergies  Amiodarone and Multaq  Electrocardiogram:  Assessment and Plan

## 2012-07-01 NOTE — Assessment & Plan Note (Signed)
No congestive symptoms continue current meds. Volume status ok

## 2012-07-01 NOTE — Assessment & Plan Note (Signed)
Cholesterol is at goal.  Continue current dose of statin and diet Rx.  No myalgias or side effects.  F/U  LFT's in 6 months. No results found for this basename: LDLCALC             

## 2012-07-01 NOTE — Assessment & Plan Note (Signed)
Good rate control and anticoagulation INR today in clinic

## 2012-07-01 NOTE — Patient Instructions (Addendum)
Your physician wants you to follow-up in:  6 MONTHS WITH DR NISHAN  You will receive a reminder letter in the mail two months in advance. If you don't receive a letter, please call our office to schedule the follow-up appointment. Your physician recommends that you continue on your current medications as directed. Please refer to the Current Medication list given to you today. 

## 2012-07-05 ENCOUNTER — Ambulatory Visit: Payer: Medicare Other | Admitting: Cardiovascular Disease

## 2012-08-12 ENCOUNTER — Ambulatory Visit (INDEPENDENT_AMBULATORY_CARE_PROVIDER_SITE_OTHER): Payer: Medicare Other

## 2012-08-12 DIAGNOSIS — I4891 Unspecified atrial fibrillation: Secondary | ICD-10-CM

## 2012-08-12 LAB — POCT INR: INR: 2.9

## 2012-09-23 ENCOUNTER — Ambulatory Visit (INDEPENDENT_AMBULATORY_CARE_PROVIDER_SITE_OTHER): Payer: Medicare Other | Admitting: *Deleted

## 2012-09-23 DIAGNOSIS — I4891 Unspecified atrial fibrillation: Secondary | ICD-10-CM

## 2012-10-19 ENCOUNTER — Telehealth: Payer: Self-pay | Admitting: *Deleted

## 2012-10-19 MED ORDER — WARFARIN SODIUM 5 MG PO TABS: ORAL_TABLET | ORAL | Status: DC

## 2012-10-19 NOTE — Telephone Encounter (Signed)
Pt needs a refill on her coumadin

## 2012-11-04 ENCOUNTER — Ambulatory Visit (INDEPENDENT_AMBULATORY_CARE_PROVIDER_SITE_OTHER): Payer: Medicare Other

## 2012-11-04 DIAGNOSIS — I4891 Unspecified atrial fibrillation: Secondary | ICD-10-CM

## 2012-11-04 LAB — POCT INR: INR: 1.6

## 2012-11-25 ENCOUNTER — Ambulatory Visit (INDEPENDENT_AMBULATORY_CARE_PROVIDER_SITE_OTHER): Payer: Medicare Other | Admitting: *Deleted

## 2012-11-25 DIAGNOSIS — I4891 Unspecified atrial fibrillation: Secondary | ICD-10-CM

## 2012-12-16 ENCOUNTER — Ambulatory Visit (INDEPENDENT_AMBULATORY_CARE_PROVIDER_SITE_OTHER): Payer: Medicare Other

## 2012-12-16 DIAGNOSIS — I4891 Unspecified atrial fibrillation: Secondary | ICD-10-CM

## 2013-01-03 ENCOUNTER — Other Ambulatory Visit: Payer: Self-pay

## 2013-01-03 ENCOUNTER — Other Ambulatory Visit: Payer: Self-pay | Admitting: *Deleted

## 2013-01-03 MED ORDER — METOPROLOL TARTRATE 100 MG PO TABS: ORAL_TABLET | ORAL | Status: DC

## 2013-01-07 ENCOUNTER — Telehealth: Payer: Self-pay

## 2013-01-07 NOTE — Telephone Encounter (Signed)
Pt called saying she received a msg from our office about one of her meds but was unsure which one. After researching, I found that there was a duplicate order for metoprolol 100mg  sent in 01/03/13 to [facility] pharmacy but one said 100mg  daily and the other said 100mg  in the am and 50mg  in the pm. Last office visit 07/01/12 with Eden Emms had her Rx as 100mg  in the am and 50mg  in the pm with NO CHANGES to meds. I called patient back and confirmed she is taking as Nishan prescribed. She is, however her Rx she picked up 8/13 says "Take 2 tabs daily". She has an appt with Nishan on Monday so she said she will take as directed by [facility] and then get further refills at her appt.

## 2013-01-10 ENCOUNTER — Ambulatory Visit (INDEPENDENT_AMBULATORY_CARE_PROVIDER_SITE_OTHER): Payer: Medicare Other | Admitting: Cardiovascular Disease

## 2013-01-10 ENCOUNTER — Other Ambulatory Visit: Payer: Self-pay | Admitting: *Deleted

## 2013-01-10 ENCOUNTER — Ambulatory Visit (INDEPENDENT_AMBULATORY_CARE_PROVIDER_SITE_OTHER): Payer: Medicare Other | Admitting: Pharmacist

## 2013-01-10 ENCOUNTER — Encounter: Payer: Self-pay | Admitting: Cardiovascular Disease

## 2013-01-10 VITALS — BP 140/80 | HR 82 | Wt 150.0 lb

## 2013-01-10 DIAGNOSIS — I428 Other cardiomyopathies: Secondary | ICD-10-CM

## 2013-01-10 DIAGNOSIS — I4891 Unspecified atrial fibrillation: Secondary | ICD-10-CM

## 2013-01-10 DIAGNOSIS — E785 Hyperlipidemia, unspecified: Secondary | ICD-10-CM

## 2013-01-10 MED ORDER — CARVEDILOL 12.5 MG PO TABS: 12.5000 mg | ORAL_TABLET | Freq: Two times a day (BID) | ORAL | Status: DC

## 2013-01-10 NOTE — Progress Notes (Signed)
Patient ID: Pamela Whitaker, female   DOB: 1932-12-29, 79 y.o.   MRN: [id number] Pamela Whitaker is seen back today for a 2 week check. She is seen for Dr. Eden Emms. She has a history of PAF. No longer on Multaq. Stopped amiodarone in the past as well. Managed with rate control and anticoagulation with coumadin. She has had a reduced EF of 30% in the past.  I saw her 2 weeks ago after she had been to her PCP. EKG there showed atrial fib with frequent PVC's. She has missed some doses of her Metoprolol. Falling. We checked a CT scan of her head. She remains on Coumadin. We updated her echo which shows improvement and placed a Holter.  She comes back today. She is here with her daughter. She says she has not fallen since she was last here. Still very unsteady due to this deformity of her feet. Still forgets her evening dose of Metoprolol. Still just using a cane and not a walker as advised. Has multiple questions about her rhythm. Does not understand the reason for the way her atrial fib is managed.   She really wants to try a different beta blocker.    ROS: Denies fever, malais, weight loss, blurry vision, decreased visual acuity, cough, sputum, SOB, hemoptysis, pleuritic pain, palpitaitons, heartburn, abdominal pain, melena, lower extremity edema, claudication, or rash.  All other systems reviewed and negative  General: Affect appropriate Elderly female  HEENT: normal Neck supple with no adenopathy JVP normal no bruits no thyromegaly Lungs clear with no wheezing and good diaphragmatic motion Heart:  S1/S2 systolic  murmur, no rub, gallop or click PMI normal Abdomen: benighn, BS positve, no tenderness, no AAA no bruit.  No HSM or HJR Distal pulses intact with no bruits No edema Neuro non-focal Skin warm and dry No muscular weakness   Current Outpatient Prescriptions  Medication Sig Dispense Refill  . metoprolol (LOPRESSOR) 100 MG tablet 100 MG EVERY AM 50 MG EVERY PM  60 tablet  6  . Multiple  Vitamin (MULTIVITAMIN) capsule Take 1 capsule by mouth daily.        Marland Kitchen warfarin (COUMADIN) 5 MG tablet Take as directed  By coumadin clinic  30 tablet  4   No current facility-administered medications for this visit.    Allergies  Amiodarone and Multaq  Electrocardiogram: 11/24  afib nonspecific St/T wave changes  Assessment and Plan

## 2013-01-10 NOTE — Assessment & Plan Note (Signed)
She still has issues with compliance especially with PM dose.  Stable enough on feet with cane to continue coumadin.  Change to coreg to see if she like this more than metoprololol

## 2013-01-10 NOTE — Patient Instructions (Addendum)
Your physician wants you to follow-up in: 6 MONTHS WITH  DR  Haywood Filler will receive a reminder letter in the mail two months in advance. If you don't receive a letter, please call our office to schedule the follow-up appointment. Your physician has recommended you make the following change in your medication: STOP METOPROLOL AND START CARVEDILOL 12.5 MG  TWICE DAILY

## 2013-01-10 NOTE — Assessment & Plan Note (Signed)
Cholesterol is at goal.  Continue current dose of statin and diet Rx.  No myalgias or side effects.  F/U  LFT's in 6 months. No results found for this basename: LDLCALC             

## 2013-01-10 NOTE — Assessment & Plan Note (Signed)
Euvolemic Activity more limited by age and orthopedic issues Continue current Rx

## 2013-02-01 ENCOUNTER — Ambulatory Visit (INDEPENDENT_AMBULATORY_CARE_PROVIDER_SITE_OTHER): Payer: Medicare Other | Admitting: *Deleted

## 2013-02-01 DIAGNOSIS — I4891 Unspecified atrial fibrillation: Secondary | ICD-10-CM

## 2013-02-16 ENCOUNTER — Encounter: Payer: Self-pay | Admitting: *Deleted

## 2013-02-16 NOTE — Telephone Encounter (Signed)
Message copied by Marlowe Kays on Wed Feb 16, 2013  5:00 PM ------      Message from: Karna Christmas D      Created: Wed Feb 16, 2013  2:23 PM       Returning your call. Wants to clarify her procedure            Mrs. Caralee Ates (Daughter)      [phone] ------

## 2013-02-16 NOTE — Telephone Encounter (Signed)
error 

## 2013-02-23 ENCOUNTER — Ambulatory Visit (INDEPENDENT_AMBULATORY_CARE_PROVIDER_SITE_OTHER): Payer: Medicare Other | Admitting: Diagnostic Neuroimaging

## 2013-02-23 ENCOUNTER — Encounter: Payer: Self-pay | Admitting: Diagnostic Neuroimaging

## 2013-02-23 VITALS — BP 124/84 | HR 91 | Temp 97.5°F | Ht 64.75 in | Wt 144.0 lb

## 2013-02-23 DIAGNOSIS — R29898 Other symptoms and signs involving the musculoskeletal system: Secondary | ICD-10-CM

## 2013-02-23 DIAGNOSIS — R292 Abnormal reflex: Secondary | ICD-10-CM

## 2013-02-23 DIAGNOSIS — I4891 Unspecified atrial fibrillation: Secondary | ICD-10-CM

## 2013-02-23 DIAGNOSIS — Z9181 History of falling: Secondary | ICD-10-CM | POA: Insufficient documentation

## 2013-02-23 DIAGNOSIS — R269 Unspecified abnormalities of gait and mobility: Secondary | ICD-10-CM

## 2013-02-23 NOTE — Progress Notes (Signed)
[facility]  PATIENT: Pamela Whitaker DOB: 09-08-1932  REFERRING CLINICIAN: Hewitt HISTORY FROM: patient and daughter REASON FOR VISIT: new consult   HISTORICAL  CHIEF COMPLAINT:  Chief Complaint  Patient presents with  . NP    abnormal gait and flat foot    HISTORY OF PRESENT ILLNESS:   79 year old right-handed female with atrial fibrillation, here for evaluation of gait and balance difficulty.  For past 5 years patient has had slow, progressive balance and gait difficulty. She had progressive difficulty picking up her feet and balancing. 3 years ago she felt her right knee buckle and she fell down. She had a left forearm fracture. Around that time she started using a cane. She's also noted to have weakness in her ankles with valgus deformity. One year ago she started using a walker.  Patient is seen orthopedic surgery for evaluation of her knee and ankles. Concern for a central nervous system process was raised to 2 significant gait difficulty out of proportion to her orthopedic issues.  Patient denies any bowel or bladder dysfunction. She denies any problems with her arms. She has mild neck pain which she attributes to helping lift her husband at home.  REVIEW OF SYSTEMS: Full 14 system review of systems performed and notable only for none.  ALLERGIES: Allergies  Allergen Reactions  . Amiodarone     Stopped on her own  . Multaq [Dronedarone]     Stopped on her own    HOME MEDICATIONS: Outpatient Prescriptions Prior to Visit  Medication Sig Dispense Refill  . carvedilol (COREG) 12.5 MG tablet Take 1 tablet (12.5 mg total) by mouth 2 (two) times daily with a meal.  60 tablet  11  . Multiple Vitamin (MULTIVITAMIN) capsule Take 1 capsule by mouth daily.        Marland Kitchen warfarin (COUMADIN) 5 MG tablet Take as directed  By coumadin clinic  30 tablet  4   No facility-administered medications prior to visit.    PAST MEDICAL HISTORY: Past Medical History    Diagnosis Date  . Atrial fibrillation     treated with multaq x 6 mos in 2011....[name]=2 (Age; + CHF; no CVA; no DM2). Echo 05/24/10: EF 30% mild AS(mean ); mild LAE; mod LAE; PASP 35 Systolic CHF  . Cardiomyopathy     a. tachy mediated Myoview 06/06/10:  EF 31%; no scar or ischemia; EF up to 50% per echo November 2013  . Breast cancer      in 1987 with a left  mastectomy  . Osteoporosis     history of bone chips in the right knee,previous right fibular fracture, operated on by Dr. Ranell Patrick  . RLS (restless legs syndrome)   . Lumbar stenosis   . Hypercholesteremia   . DJD (degenerative joint disease) of knee     right knee  . Falls     PAST SURGICAL HISTORY: Past Surgical History  Procedure Laterality Date  . Knee arthroscopy  2005  . Mastectomy, radical  1987    left breast  . Breast reconstruction  1988    left breast  . Wrist fracture surgery      left wrist    FAMILY HISTORY: Family History  Problem Relation Age of Onset  . Heart attack Father   . Aortic aneurysm Mother   . Cancer Sister     breast cancer  . Diabetes Sister     SOCIAL HISTORY:  History   Social History  . Marital Status: Married  Spouse Name: Iantha Fallen    Number of Children: 2  . Years of Education: college   Occupational History  .     Social History Main Topics  . Smoking status: Never Smoker   . Smokeless tobacco: Never Used  . Alcohol Use: No  . Drug Use: No  . Sexual Activity: Not Currently   Other Topics Concern  . Not on file   Social History Narrative  . No narrative on file     PHYSICAL EXAM  Filed Vitals:   02/23/13 0927  BP: 124/84  Pulse: 91  Temp: 97.5 F (36.4 C)  TempSrc: Oral  Height: 5' 4.75" (1.645 m)  Weight: 144 lb (65.318 kg)    Not recorded    Body mass index is 24.14 kg/(m^2).  GENERAL EXAM: Patient is in no distress  CARDIOVASCULAR: Regular rate and rhythm, no murmurs, no carotid bruits  NEUROLOGIC: MENTAL STATUS: awake,  alert, language fluent, comprehension intact, naming intact CRANIAL NERVE: no papilledema on fundoscopic exam, pupils equal and reactive to light, visual fields full to confrontation, extraocular muscles intact, no nystagmus, facial sensation and strength symmetric, uvula midline, shoulder shrug symmetric, tongue midline. MOTOR: normal bulk and tone, full strength in the BUE, BLE (HF 3-4, OTHERWISE 5).  SENSORY: normal and symmetric to light touch, pinprick, temperature, vibration COORDINATION: finger-nose-finger, fine finger movements normal REFLEXES: BUE (BICEPS 2, TRICEPS 3, BRACHIORAD 2, +PECTORAL REFLEXES), BLE (KNEE 3, ANKLES 2, UPGOING TOES ON PLANTAR STIM).  GAIT/STATION: VERY UNSTEADY. SLOW TO RISE. BARELY ABLE TO LIFT HER FEET OFF THE GROUND. SLIDES AND SHUFFLES HER FEET. SIG VALGUS DEFORM OF ANKLES AND KNEES.    DIAGNOSTIC DATA (LABS, IMAGING, TESTING) - I reviewed patient records, labs, notes, testing and imaging myself where available.  Lab Results  Component Value Date   WBC 7.1 06/29/2012   HGB 13.9 06/29/2012   HCT 41.5 06/29/2012   MCV 93.3 06/29/2012   PLT 188 06/29/2012      Component Value Date/Time   NA 141 06/29/2012 1159   K 4.1 06/29/2012 1159   CL 105 06/29/2012 1159   CO2 31 06/29/2012 1159   GLUCOSE 98 06/29/2012 1159   BUN 11 06/29/2012 1159   CREATININE 0.65 06/29/2012 1159   CALCIUM 9.8 06/29/2012 1159   PROT 7.5 06/29/2012 1159   ALBUMIN 3.8 06/29/2012 1159   AST 17 06/29/2012 1159   ALT 22 06/29/2012 1159   ALKPHOS 81 06/29/2012 1159   BILITOT 0.3 06/29/2012 1159   GFRNONAA 83* 06/29/2012 1159   GFRAA >90 06/29/2012 1159   No results found for this basename: CHOL, HDL, LDLCALC, LDLDIRECT, TRIG, CHOLHDL   No results found for this basename: HGBA1C   Lab Results  Component Value Date   VITAMINB12 673 06/29/2012   Lab Results  Component Value Date   TSH 2.745 06/29/2012   I reviewed images myself and agree with interpretation. 06/04/07 MRI lumbar spine - IMPRESSION:  1. Negative for  metastatic disease to the lumbar spine.  2. Moderate to severe spinal stenosis at L4-5 due to disc protrusion and facet overgrowth. Foraminal narrowing is more severe on the left than the right. There is also a small lateral disc protrusion on the left at L3-4.   ASSESSMENT AND PLAN  79 y.o. year old female here with progressive gait difficulty over 5 years, much worse in last 1 year. Exam notable for weakness of bilateral hip flexors, and hyperreflexia in BUE and BLE. Concerning for cervical spinal cord compression. Other  possibilities include brainstem, cerebellum localization. Patient also has history of moderate-severe spinal stenosis at L4-5 from 2009.  Ddx: brainstem/cerebellum stroke, c-spine myelopathy, lumbar spinal stenosis   PLAN: Orders Placed This Encounter  Procedures  . MR Brain Wo Contrast  . MR Cervical Spine Wo Contrast   Return in about 1 month (around 03/26/2013) for with Heide Guile or Rockney Grenz.  I reviewed images, labs, notes, records myself. I summarized findings and reviewed with patient, for this high risk condition (possible stroke or spinal cord compression) requiring high complexity decision making.   Suanne Marker, MD 02/23/2013, 10:18 AM Certified in Neurology, Neurophysiology and Neuroimaging  [facility] [address] [place], [place] [address] [phone]

## 2013-02-23 NOTE — Patient Instructions (Signed)
I will check MRI scans.  Use your walker please.

## 2013-03-01 ENCOUNTER — Ambulatory Visit (INDEPENDENT_AMBULATORY_CARE_PROVIDER_SITE_OTHER): Payer: Medicare Other | Admitting: General Practice

## 2013-03-01 DIAGNOSIS — I4891 Unspecified atrial fibrillation: Secondary | ICD-10-CM

## 2013-03-08 ENCOUNTER — Ambulatory Visit
Admission: RE | Admit: 2013-03-08 | Discharge: 2013-03-08 | Disposition: A | Discharge status: Home / Self Care | Payer: 59 | Source: Ambulatory Visit | Attending: Diagnostic Neuroimaging | Admitting: Diagnostic Neuroimaging

## 2013-03-08 DIAGNOSIS — R29898 Other symptoms and signs involving the musculoskeletal system: Secondary | ICD-10-CM

## 2013-03-08 DIAGNOSIS — R292 Abnormal reflex: Secondary | ICD-10-CM

## 2013-03-08 DIAGNOSIS — R269 Unspecified abnormalities of gait and mobility: Secondary | ICD-10-CM

## 2013-03-08 DIAGNOSIS — I4891 Unspecified atrial fibrillation: Secondary | ICD-10-CM

## 2013-03-15 ENCOUNTER — Ambulatory Visit (INDEPENDENT_AMBULATORY_CARE_PROVIDER_SITE_OTHER): Payer: Medicare Other | Admitting: *Deleted

## 2013-03-15 DIAGNOSIS — I4891 Unspecified atrial fibrillation: Secondary | ICD-10-CM

## 2013-03-15 LAB — POCT INR: INR: 5.5

## 2013-03-23 ENCOUNTER — Ambulatory Visit (INDEPENDENT_AMBULATORY_CARE_PROVIDER_SITE_OTHER): Payer: Medicare Other | Admitting: *Deleted

## 2013-03-23 DIAGNOSIS — I4891 Unspecified atrial fibrillation: Secondary | ICD-10-CM

## 2013-03-23 MED ORDER — WARFARIN SODIUM 2.5 MG PO TABS: 2.5000 mg | ORAL_TABLET | ORAL | Status: DC

## 2013-03-28 ENCOUNTER — Encounter: Payer: Self-pay | Admitting: Nurse Practitioner

## 2013-03-28 ENCOUNTER — Ambulatory Visit (INDEPENDENT_AMBULATORY_CARE_PROVIDER_SITE_OTHER): Payer: Medicare Other | Admitting: Nurse Practitioner

## 2013-03-28 VITALS — BP 138/77 | HR 90 | Ht 64.0 in | Wt 146.0 lb

## 2013-03-28 DIAGNOSIS — R29898 Other symptoms and signs involving the musculoskeletal system: Secondary | ICD-10-CM

## 2013-03-28 DIAGNOSIS — M48061 Spinal stenosis, lumbar region without neurogenic claudication: Secondary | ICD-10-CM

## 2013-03-28 DIAGNOSIS — R269 Unspecified abnormalities of gait and mobility: Secondary | ICD-10-CM

## 2013-03-28 DIAGNOSIS — M48062 Spinal stenosis, lumbar region with neurogenic claudication: Secondary | ICD-10-CM | POA: Insufficient documentation

## 2013-03-28 NOTE — Patient Instructions (Signed)
We will check labs today, CK and Aldolase.  We will have you come back for NCV/EMG study (nerve conduction).  Someone will call you to schedule this.  We will check MRI Lumbar Spine without contrast.  Someone will call you to schedule this.   Follow up in 4-6 weeks.

## 2013-03-28 NOTE — Progress Notes (Signed)
[facility]  PATIENT: Pamela Whitaker DOB: 04-27-1933   REASON FOR VISIT: follow up HISTORY FROM: patient  HISTORY OF PRESENT ILLNESS: UPDATE 03/28/13 (LL):  Pamela Whitaker returns for 1 month follow up visit.  Her MRI Brain and Cervical Spine did not show any reasons for her LE weakness.  Brain shows small vessel disease and normal for age atrophy. She is ambulating some better in office today, but overall her daughter thinks she is the same.  Her husband died on 24-Nov-2024after an extended illness.  She denies back pain or any problems with bowel or bladder.  Most of her pain is in right metatarsophalangeal joint of the great toe when walking, due to pressure exerted by valgus deformity.  79 year old right-handed female with atrial fibrillation, here for evaluation of gait and balance difficulty.  For past 5 years patient has had slow, progressive balance and gait difficulty. She had progressive difficulty picking up her feet and balancing. 3 years ago she felt her right knee buckle and she fell down. She had a left forearm fracture. Around that time she started using a cane. She's also noted to have weakness in her ankles with valgus deformity. One year ago she started using a walker.  Patient is seen orthopedic surgery for evaluation of her knee and ankles. Concern for a central nervous system process was raised due to significant gait difficulty out of proportion to her orthopedic issues.  Patient denies any bowel or bladder dysfunction. She denies any problems with her arms. She has mild neck pain which she attributes to helping lift her husband at home.   REVIEW OF SYSTEMS: Full 14 system review of systems performed and notable only for none.   ALLERGIES: Allergies  Allergen Reactions  . Amiodarone     Stopped on her own  . Multaq [Dronedarone]     Stopped on her own    HOME MEDICATIONS: Outpatient Prescriptions Prior to Visit  Medication Sig Dispense Refill  .  carvedilol (COREG) 12.5 MG tablet Take 1 tablet (12.5 mg total) by mouth 2 (two) times daily with a meal.  60 tablet  11  . Multiple Vitamin (MULTIVITAMIN) capsule Take 1 capsule by mouth daily.        Marland Kitchen warfarin (COUMADIN) 2.5 MG tablet Take 1 tablet (2.5 mg total) by mouth as directed.  30 tablet  3   No facility-administered medications prior to visit.    PAST MEDICAL HISTORY: Past Medical History  Diagnosis Date  . Atrial fibrillation     treated with multaq x 6 mos in 2011....[name]=2 (Age; + CHF; no CVA; no DM2). Echo 05/24/10: EF 30% mild AS(mean ); mild LAE; mod LAE; PASP 35 Systolic CHF  . Cardiomyopathy     a. tachy mediated Myoview 06/06/10:  EF 31%; no scar or ischemia; EF up to 50% per echo November 2013  . Breast cancer      in 1987 with a left  mastectomy  . Osteoporosis     history of bone chips in the right knee,previous right fibular fracture, operated on by Dr. Ranell Patrick  . RLS (restless legs syndrome)   . Lumbar stenosis   . Hypercholesteremia   . DJD (degenerative joint disease) of knee     right knee  . Falls     PAST SURGICAL HISTORY: Past Surgical History  Procedure Laterality Date  . Knee arthroscopy  2005  . Mastectomy, radical  1987    left breast  . Breast  reconstruction  1988    left breast  . Wrist fracture surgery      left wrist    FAMILY HISTORY: Family History  Problem Relation Age of Onset  . Heart attack Father   . Aortic aneurysm Mother   . Cancer Sister     breast cancer  . Diabetes Sister     SOCIAL HISTORY: History   Social History  . Marital Status: Married    Spouse Name: Pamela Whitaker    Number of Children: 2  . Years of Education: college   Occupational History  .     Social History Main Topics  . Smoking status: Never Smoker   . Smokeless tobacco: Never Used  . Alcohol Use: No  . Drug Use: No  . Sexual Activity: No   Other Topics Concern  . Not on file   Social History Narrative  . No narrative on file      PHYSICAL EXAM  Filed Vitals:   03/28/13 1043  BP: 138/77  Pulse: 90  Height: 5\' 4"  (1.626 m)  Weight: 146 lb (66.225 kg)   Body mass index is 25.05 kg/(m^2).  Generalized: Well developed, in no acute distress  Head: normocephalic and atraumatic. Oropharynx benign  Neck: Supple, no carotid bruits  Cardiac: Regular rate rhythm, no murmur  Musculoskeletal: SIG VALGUS DEFORM OF ANKLES AND KNEES.   Neurological examination  MENTAL STATUS: awake, alert, language fluent, comprehension intact, naming intact  CRANIAL NERVE:  pupils equal and reactive to light, visual fields full to confrontation, extraocular muscles intact, no nystagmus, facial sensation and strength symmetric, uvula midline, shoulder shrug symmetric, tongue midline.  MOTOR: normal bulk and tone, full strength in the BUE, BLE (HF 3-4, OTHERWISE 5).  SENSORY: normal and symmetric to light touch  COORDINATION: finger-nose-finger, fine finger movements normal  REFLEXES: BUE (BICEPS 2, TRICEPS 3, BRACHIORAD 2, +PECTORAL REFLEXES), BLE (KNEE 3, ANKLES 2, plantars downward going. GAIT/STATION: VERY UNSTEADY. SLOW TO RISE. SLIDES AND SHUFFLES HER FEET. USING ROLLING WALKER.  DIAGNOSTIC DATA (LABS, IMAGING, TESTING) - I reviewed patient records, labs, notes, testing and imaging myself where available.  Lab Results  Component Value Date   WBC 7.1 06/29/2012   HGB 13.9 06/29/2012   HCT 41.5 06/29/2012   MCV 93.3 06/29/2012   PLT 188 06/29/2012      Component Value Date/Time   NA 141 06/29/2012 1159   K 4.1 06/29/2012 1159   CL 105 06/29/2012 1159   CO2 31 06/29/2012 1159   GLUCOSE 98 06/29/2012 1159   BUN 11 06/29/2012 1159   CREATININE 0.65 06/29/2012 1159   CALCIUM 9.8 06/29/2012 1159   PROT 7.5 06/29/2012 1159   ALBUMIN 3.8 06/29/2012 1159   AST 17 06/29/2012 1159   ALT 22 06/29/2012 1159   ALKPHOS 81 06/29/2012 1159   BILITOT 0.3 06/29/2012 1159   GFRNONAA 83* 06/29/2012 1159   GFRAA >90 06/29/2012 1159    Lab Results  Component Value Date    VITAMINB12 673 06/29/2012   Lab Results  Component Value Date   TSH 2.745 06/29/2012    06/04/07 MRI lumbar spine  Negative for metastatic disease to the lumbar spine. Moderate to severe spinal stenosis at L4-5 due to disc protrusion and facet overgrowth. Foraminal narrowing is more severe on the left than the right. There is also a small lateral disc protrusion on the left at L3-4.  MRI Head wo 03/08/13  Abnormal MRI scan of the brain showing mild changes of chronic microvascular  ischemia and generalized cerebral atrophy. MRI Cervical spine wo  03/08/13 Mildly abnormal MRI scan of cervical spine showing mild degenerative changes mainly at C4-5 and C5-6 but without significant compression.  ASSESSMENT AND PLAN  79 y.o. year old female here with progressive gait difficulty over 5 years, much worse in last 1 year. Exam notable for weakness of bilateral hip flexors, and hyperreflexia in BUE and BLE. MRI brain shows age-related chnages and cervical spine negative for cord compression.  Patient also has history of moderate-severe spinal stenosis at L4-5 from 2009.   PLAN: We will check labs today, CK and Aldolase. NCV/EMG of bilateral lower extremeities. MRI Lumbar Spine without contrast. Follow up in 4-6 weeks.  Orders Placed This Encounter  Procedures  . MR Lumbar Spine Wo Contrast  . CK  . Aldolase  . NCV with EMG(electromyography)   Tawny Asal Haniel Fix, MSN, NP-C 03/28/2013, 11:32 AM [facility] [address], [address] [place], [place] [address] [phone]

## 2013-03-29 LAB — CK: Total CK: 95 U/L (ref 24–173)

## 2013-03-29 LAB — ALDOLASE: Aldolase: 13.4 U/L — ABNORMAL HIGH (ref 1.2–7.6)

## 2013-03-31 ENCOUNTER — Other Ambulatory Visit: Payer: Self-pay

## 2013-04-04 ENCOUNTER — Encounter (INDEPENDENT_AMBULATORY_CARE_PROVIDER_SITE_OTHER): Payer: Medicare Other | Admitting: Radiology

## 2013-04-04 ENCOUNTER — Ambulatory Visit (INDEPENDENT_AMBULATORY_CARE_PROVIDER_SITE_OTHER): Payer: Medicare Other | Admitting: Diagnostic Neuroimaging

## 2013-04-04 ENCOUNTER — Ambulatory Visit (INDEPENDENT_AMBULATORY_CARE_PROVIDER_SITE_OTHER): Payer: Medicare Other | Admitting: Pharmacist

## 2013-04-04 DIAGNOSIS — I4891 Unspecified atrial fibrillation: Secondary | ICD-10-CM

## 2013-04-04 DIAGNOSIS — R29898 Other symptoms and signs involving the musculoskeletal system: Secondary | ICD-10-CM

## 2013-04-04 DIAGNOSIS — Z0289 Encounter for other administrative examinations: Secondary | ICD-10-CM

## 2013-04-04 DIAGNOSIS — M48061 Spinal stenosis, lumbar region without neurogenic claudication: Secondary | ICD-10-CM

## 2013-04-04 DIAGNOSIS — R269 Unspecified abnormalities of gait and mobility: Secondary | ICD-10-CM

## 2013-04-04 LAB — POCT INR: INR: 3.7

## 2013-04-06 DIAGNOSIS — M48061 Spinal stenosis, lumbar region without neurogenic claudication: Secondary | ICD-10-CM

## 2013-04-07 ENCOUNTER — Ambulatory Visit
Admission: RE | Admit: 2013-04-07 | Discharge: 2013-04-07 | Disposition: A | Discharge status: Home / Self Care | Payer: Medicare Other | Source: Ambulatory Visit | Attending: Nurse Practitioner | Admitting: Nurse Practitioner

## 2013-04-07 DIAGNOSIS — R269 Unspecified abnormalities of gait and mobility: Secondary | ICD-10-CM

## 2013-04-07 DIAGNOSIS — M48061 Spinal stenosis, lumbar region without neurogenic claudication: Secondary | ICD-10-CM

## 2013-04-07 DIAGNOSIS — R29898 Other symptoms and signs involving the musculoskeletal system: Secondary | ICD-10-CM

## 2013-04-13 ENCOUNTER — Telehealth: Payer: Self-pay | Admitting: *Deleted

## 2013-04-13 ENCOUNTER — Ambulatory Visit (INDEPENDENT_AMBULATORY_CARE_PROVIDER_SITE_OTHER): Payer: Medicare Other | Admitting: Pharmacist

## 2013-04-13 DIAGNOSIS — I4891 Unspecified atrial fibrillation: Secondary | ICD-10-CM

## 2013-04-13 NOTE — Telephone Encounter (Signed)
I called pt and LMVM for then to call me back about results.

## 2013-04-15 ENCOUNTER — Encounter: Payer: Self-pay | Admitting: *Deleted

## 2013-04-15 NOTE — Telephone Encounter (Signed)
LMVM for pt to return call for results.   Mailed letter to call for results.

## 2013-04-18 NOTE — Telephone Encounter (Signed)
Pt and I finally connected.   MRI results given, with her verbal understanding.   Will mail copies to her. If daughter has questions to let me know.

## 2013-04-22 ENCOUNTER — Encounter: Payer: Self-pay | Admitting: Internal Medicine

## 2013-04-22 DIAGNOSIS — I10 Essential (primary) hypertension: Secondary | ICD-10-CM | POA: Insufficient documentation

## 2013-04-22 DIAGNOSIS — E559 Vitamin D deficiency, unspecified: Secondary | ICD-10-CM | POA: Insufficient documentation

## 2013-04-25 NOTE — Procedures (Signed)
   [facility]  NCS (NERVE CONDUCTION STUDY) WITH EMG (ELECTROMYOGRAPHY) REPORT   STUDY DATE: 04/04/13 PATIENT NAME: Pamela Whitaker DOB: December 20, 1932 MRN: [id number]  ORDERING CLINICIAN: Joycelyn Schmid, MD   TECHNOLOGIST: Kaylyn Lim ELECTROMYOGRAPHER: Glenford Bayley. Penumalli, MD  CLINICAL INFORMATION: 79 year old female with lower extremity numbness and pain.  FINDINGS: NERVE CONDUCTION STUDY: Bilateral peroneal motor responses have normal distal latencies, decreased amplitude, normal conduction velocities and normal F-wave latencies. Bilateral tibial motor responses and F-wave latencies are normal. Bilateral sural sensory responses are normal.  NEEDLE ELECTROMYOGRAPHY: Needle examination of selected muscles of left upper extremity (vastus medialis, tibialis anterior, gastrocnemius) demonstrates 1+ spontaneous activity in the left gastrocnemius muscle at rest and normal motor unit recruitment on exertion. Paraspinal muscles were not examined as patient is on Coumadin.  IMPRESSION:  Abnormal study demonstrating possible bilateral L5 radiculopathies. No definite widespread large fiber neuropathy.   INTERPRETING PHYSICIAN:  Suanne Marker, MD Certified in Neurology, Neurophysiology and Neuroimaging  [facility] [address] [place], [place] [address] [phone]

## 2013-04-26 ENCOUNTER — Encounter: Payer: Self-pay | Admitting: Emergency Medicine

## 2013-04-26 ENCOUNTER — Ambulatory Visit (INDEPENDENT_AMBULATORY_CARE_PROVIDER_SITE_OTHER): Payer: Medicare Other | Admitting: Emergency Medicine

## 2013-04-26 VITALS — BP 122/84 | HR 76 | Temp 97.8°F | Resp 16 | Ht 64.0 in | Wt 144.0 lb

## 2013-04-26 DIAGNOSIS — I10 Essential (primary) hypertension: Secondary | ICD-10-CM

## 2013-04-26 DIAGNOSIS — R32 Unspecified urinary incontinence: Secondary | ICD-10-CM

## 2013-04-26 DIAGNOSIS — Z1212 Encounter for screening for malignant neoplasm of rectum: Secondary | ICD-10-CM

## 2013-04-26 DIAGNOSIS — E559 Vitamin D deficiency, unspecified: Secondary | ICD-10-CM

## 2013-04-26 DIAGNOSIS — R5381 Other malaise: Secondary | ICD-10-CM

## 2013-04-26 DIAGNOSIS — R7309 Other abnormal glucose: Secondary | ICD-10-CM

## 2013-04-26 DIAGNOSIS — Z0000 Encounter for general adult medical examination without abnormal findings: Secondary | ICD-10-CM

## 2013-04-26 DIAGNOSIS — E782 Mixed hyperlipidemia: Secondary | ICD-10-CM

## 2013-04-26 DIAGNOSIS — E538 Deficiency of other specified B group vitamins: Secondary | ICD-10-CM

## 2013-04-26 DIAGNOSIS — D649 Anemia, unspecified: Secondary | ICD-10-CM

## 2013-04-26 LAB — CBC WITH DIFFERENTIAL/PLATELET
Basophils Relative: 1 % (ref 0–1)
Eosinophils Relative: 2 % (ref 0–5)
HCT: 39.5 % (ref 36.0–46.0)
Hemoglobin: 13.4 g/dL (ref 12.0–15.0)
Lymphocytes Relative: 21 % (ref 12–46)
Lymphs Abs: 1.4 10*3/uL (ref 0.7–4.0)
MCH: 30.5 pg (ref 26.0–34.0)
MCV: 90 fL (ref 78.0–100.0)
Monocytes Absolute: 0.6 10*3/uL (ref 0.1–1.0)
Neutro Abs: 4.5 10*3/uL (ref 1.7–7.7)
RBC: 4.39 MIL/uL (ref 3.87–5.11)
RDW: 13.6 % (ref 11.5–15.5)
WBC: 6.6 10*3/uL (ref 4.0–10.5)

## 2013-04-26 LAB — BASIC METABOLIC PANEL WITH GFR
BUN: 12 mg/dL (ref 6–23)
CO2: 30 mEq/L (ref 19–32)
Chloride: 104 mEq/L (ref 96–112)
Creat: 0.65 mg/dL (ref 0.50–1.10)
GFR, Est Non African American: 85 mL/min
Glucose, Bld: 93 mg/dL (ref 70–99)

## 2013-04-26 LAB — HEMOGLOBIN A1C: Mean Plasma Glucose: 120 mg/dL — ABNORMAL HIGH (ref <117)

## 2013-04-26 LAB — HEPATIC FUNCTION PANEL
ALT: 10 U/L (ref 0–35)
Albumin: 3.8 g/dL (ref 3.5–5.2)
Bilirubin, Direct: 0.1 mg/dL (ref 0.0–0.3)
Total Protein: 6.9 g/dL (ref 6.0–8.3)

## 2013-04-26 LAB — LIPID PANEL
Cholesterol: 227 mg/dL — ABNORMAL HIGH (ref 0–200)
Triglycerides: 278 mg/dL — ABNORMAL HIGH (ref <150)
VLDL: 56 mg/dL — ABNORMAL HIGH (ref 0–40)

## 2013-04-26 NOTE — Patient Instructions (Signed)
Fat and Cholesterol Control Diet Fat and cholesterol levels in your blood and organs are influenced by your diet. High levels of fat and cholesterol may lead to diseases of the heart, small and large blood vessels, gallbladder, liver, and pancreas. CONTROLLING FAT AND CHOLESTEROL WITH DIET Although exercise and lifestyle factors are important, your diet is key. That is because certain foods are known to raise cholesterol and others to lower it. The goal is to balance foods for their effect on cholesterol and more importantly, to replace saturated and trans fat with other types of fat, such as monounsaturated fat, polyunsaturated fat, and omega-3 fatty acids. On average, a person should consume no more than 15 to 17 g of saturated fat daily. Saturated and trans fats are considered "bad" fats, and they will raise LDL cholesterol. Saturated fats are primarily found in animal products such as meats, butter, and cream. However, that does not mean you need to give up all your favorite foods. Today, there are good tasting, low-fat, low-cholesterol substitutes for most of the things you like to eat. Choose low-fat or nonfat alternatives. Choose round or loin cuts of red meat. These types of cuts are lowest in fat and cholesterol. Chicken (without the skin), fish, veal, and ground turkey breast are great choices. Eliminate fatty meats, such as hot dogs and salami. Even shellfish have little or no saturated fat. Have a 3 oz (85 g) portion when you eat lean meat, poultry, or fish. Trans fats are also called "partially hydrogenated oils." They are oils that have been scientifically manipulated so that they are solid at room temperature resulting in a longer shelf life and improved taste and texture of foods in which they are added. Trans fats are found in stick margarine, some tub margarines, cookies, crackers, and baked goods.  When baking and cooking, oils are a great substitute for butter. The monounsaturated oils are  especially beneficial since it is believed they lower LDL and raise HDL. The oils you should avoid entirely are saturated tropical oils, such as coconut and palm.  Remember to eat a lot from food groups that are naturally free of saturated and trans fat, including fish, fruit, vegetables, beans, grains (barley, rice, couscous, bulgur wheat), and pasta (without cream sauces).  IDENTIFYING FOODS THAT LOWER FAT AND CHOLESTEROL  Soluble fiber may lower your cholesterol. This type of fiber is found in fruits such as apples, vegetables such as broccoli, potatoes, and carrots, legumes such as beans, peas, and lentils, and grains such as barley. Foods fortified with plant sterols (phytosterol) may also lower cholesterol. You should eat at least 2 g per day of these foods for a cholesterol lowering effect.  Read package labels to identify low-saturated fats, trans fat free, and low-fat foods at the supermarket. Select cheeses that have only 2 to 3 g saturated fat per ounce. Use a heart-healthy tub margarine that is free of trans fats or partially hydrogenated oil. When buying baked goods (cookies, crackers), avoid partially hydrogenated oils. Breads and muffins should be made from whole grains (whole-wheat or whole oat flour, instead of "flour" or "enriched flour"). Buy non-creamy canned soups with reduced salt and no added fats.  FOOD PREPARATION TECHNIQUES  Never deep-fry. If you must fry, either stir-fry, which uses very little fat, or use non-stick cooking sprays. When possible, broil, bake, or roast meats, and steam vegetables. Instead of putting butter or margarine on vegetables, use lemon and herbs, applesauce, and cinnamon (for squash and sweet potatoes). Use nonfat   yogurt, salsa, and low-fat dressings for salads.  LOW-SATURATED FAT / LOW-FAT FOOD SUBSTITUTES Meats / Saturated Fat (g)  Avoid: Steak, marbled (3 oz/85 g) / 11 g  Choose: Steak, lean (3 oz/85 g) / 4 g  Avoid: Hamburger (3 oz/85 g) / 7  g  Choose: Hamburger, lean (3 oz/85 g) / 5 g  Avoid: Ham (3 oz/85 g) / 6 g  Choose: Ham, lean cut (3 oz/85 g) / 2.4 g  Avoid: Chicken, with skin, dark meat (3 oz/85 g) / 4 g  Choose: Chicken, skin removed, dark meat (3 oz/85 g) / 2 g  Avoid: Chicken, with skin, light meat (3 oz/85 g) / 2.5 g  Choose: Chicken, skin removed, light meat (3 oz/85 g) / 1 g Dairy / Saturated Fat (g)  Avoid: Whole milk (1 cup) / 5 g  Choose: Low-fat milk, 2% (1 cup) / 3 g  Choose: Low-fat milk, 1% (1 cup) / 1.5 g  Choose: Skim milk (1 cup) / 0.3 g  Avoid: Hard cheese (1 oz/28 g) / 6 g  Choose: Skim milk cheese (1 oz/28 g) / 2 to 3 g  Avoid: Cottage cheese, 4% fat (1 cup) / 6.5 g  Choose: Low-fat cottage cheese, 1% fat (1 cup) / 1.5 g  Avoid: Ice cream (1 cup) / 9 g  Choose: Sherbet (1 cup) / 2.5 g  Choose: Nonfat frozen yogurt (1 cup) / 0.3 g  Choose: Frozen fruit bar / trace  Avoid: Whipped cream (1 tbs) / 3.5 g  Choose: Nondairy whipped topping (1 tbs) / 1 g Condiments / Saturated Fat (g)  Avoid: Mayonnaise (1 tbs) / 2 g  Choose: Low-fat mayonnaise (1 tbs) / 1 g  Avoid: Butter (1 tbs) / 7 g  Choose: Extra light margarine (1 tbs) / 1 g  Avoid: Coconut oil (1 tbs) / 11.8 g  Choose: Olive oil (1 tbs) / 1.8 g  Choose: Corn oil (1 tbs) / 1.7 g  Choose: Safflower oil (1 tbs) / 1.2 g  Choose: Sunflower oil (1 tbs) / 1.4 g  Choose: Soybean oil (1 tbs) / 2.4 g  Choose: Canola oil (1 tbs) / 1 g Document Released: 05/12/2005 Document Revised: 09/06/2012 Document Reviewed: 10/31/2010 [employer] Patient Information 2014 [place], [place]. Spinal Stenosis Spinal stenosis is when the open spaces between the bones of your spine (vertebrae) get smaller (narrow). It is caused by bone pushing on the open spaces of your spine. This puts pressure on your spine and the nerves in your spine. You may be given medicine to lessen the puffiness (inflammation) in your nerves. Other times, surgery is  needed. HOME CARE  Change positions when you sit, stand, and lie. This can help take pressure off your nerves.  Do exercises as told by your doctor to strengthen the middle part of your body.  Lose weight if your doctor suggests it. This takes pressure off your spine.  Take all medicine as told by your doctor. MAKE SURE YOU:  Understand these instructions.  Will watch your condition.  Will get help right away if you are not doing well or get worse. Document Released: 09/05/2010 Document Revised: 01/12/2013 Document Reviewed: 08/20/2012 [facility] Patient Information 2014 [place], [place].

## 2013-04-27 LAB — MICROALBUMIN / CREATININE URINE RATIO
Creatinine, Urine: 70.5 mg/dL
Microalb, Ur: 0.5 mg/dL (ref 0.00–1.89)

## 2013-04-27 LAB — URINALYSIS, ROUTINE W REFLEX MICROSCOPIC
Glucose, UA: NEGATIVE mg/dL
Ketones, ur: NEGATIVE mg/dL
Nitrite: NEGATIVE
Specific Gravity, Urine: 1.015 (ref 1.005–1.030)
Urobilinogen, UA: 0.2 mg/dL (ref 0.0–1.0)
pH: 6.5 (ref 5.0–8.0)

## 2013-04-27 LAB — URINALYSIS, MICROSCOPIC ONLY
Crystals: NONE SEEN
Squamous Epithelial / LPF: NONE SEEN

## 2013-04-27 LAB — URINE CULTURE
Colony Count: NO GROWTH
Organism ID, Bacteria: NO GROWTH

## 2013-04-27 LAB — VITAMIN D 25 HYDROXY (VIT D DEFICIENCY, FRACTURES): Vit D, 25-Hydroxy: 43 ng/mL (ref 30–89)

## 2013-04-27 LAB — VITAMIN B12: Vitamin B-12: 627 pg/mL (ref 211–911)

## 2013-04-27 NOTE — Progress Notes (Addendum)
Subjective:    Patient ID: Pamela Whitaker, female    DOB: 07/25/1932, 79 y.o.   MRN: [id number]  HPI Comments: 79 yo pleasant female CPE and presents for 3 month F/U for HTN, Cholesterol, Pre-Dm, D. Deficient. She has not been eating healthy or exercising regularly. She decline statins due to possible side effects. LAST ABNORMAL LABS T 239 TG 279 H 36 L 147 MAG 2 D 45. She has not followed up AD due to sick husband.  She has noticed since starting Coreg she has been more off balance. She denies falls, but is using a cain or walk to aide in walking. She notes BP has improved with decreased stress with husbands death. She is wearing an alert necklace since she now lives alone. She is also been worked up by Dr. Everlene Balls for her spinal stenosis on MRI of the lumbar. She noted he also evaluated her for CVA with MRI which was negative except for age/ ischemic related atrophy. She has also noticed mild increase with urine incontinence over last several months. She denies any other symptoms except urgency.  She is mildly depressed with the death of her husband but is trying to get through grief without prescription. She is starting to increase her church attendance and work with talking to friends and family about her loss. Denies SI/ HI.   Hypertension  Hyperlipidemia  Atrial Fibrillation Symptoms include dizziness, hypertension and weakness. Past medical history includes atrial fibrillation and hyperlipidemia.    Current Outpatient Prescriptions on File Prior to Visit  Medication Sig Dispense Refill  . carvedilol (COREG) 12.5 MG tablet Take 1 tablet (12.5 mg total) by mouth 2 (two) times daily with a meal.  60 tablet  11  . Cholecalciferol (VITAMIN D) 2000 UNITS tablet Take 2,000 Units by mouth 2 (two) times daily.      . clonazePAM (KLONOPIN) 1 MG tablet Take 1 mg by mouth at bedtime as needed.       . Multiple Vitamin (MULTIVITAMIN) capsule Take 1 capsule by mouth daily. Women's Centrum       . warfarin (COUMADIN) 2.5 MG tablet Take 1 tablet (2.5 mg total) by mouth as directed.  30 tablet  3   No current facility-administered medications on file prior to visit.   ALLERGIES Amiodarone; Lipitor; and Multaq  Past Medical History  Diagnosis Date  . Atrial fibrillation     treated with multaq x 6 mos in 2011....[name]=2 (Age; + CHF; no CVA; no DM2). Echo 05/24/10: EF 30% mild AS(mean ); mild LAE; mod LAE; PASP 35 Systolic CHF  . Cardiomyopathy     a. tachy mediated Myoview 06/06/10:  EF 31%; no scar or ischemia; EF up to 50% per echo November 2013  . Breast cancer      in 1987 with a left  mastectomy  . Osteoporosis     history of bone chips in the right knee,previous right fibular fracture, operated on by Dr. Ranell Patrick  . RLS (restless legs syndrome)   . Lumbar stenosis   . Hypercholesteremia   . DJD (degenerative joint disease) of knee     right knee  . Falls   . Hypertension   . Vitamin D deficiency     Past Surgical History  Procedure Laterality Date  . Knee arthroscopy  2005  . Mastectomy, radical  1987    left breast  . Breast reconstruction  1988    left breast  . Wrist fracture surgery  left wrist    History  Substance Use Topics  . Smoking status: Never Smoker   . Smokeless tobacco: Never Used  . Alcohol Use: No    Family History  Problem Relation Age of Onset  . Heart attack Father   . Aortic aneurysm Mother   . Cancer Sister     breast cancer  . Diabetes Sister     Review of Systems  Constitutional: Positive for fatigue.  Eyes:       Dr. Ashley Royalty Cataracts stable 2014  Cardiovascular:       Dr. Eden Emms 2014 WNL per pt  Gastrointestinal:       Dr. Randa Evens PRN  Genitourinary: Positive for urgency.  Musculoskeletal: Positive for back pain.       Dr Antony Madura PRN  Skin:       Dr. Yetta Barre 2014 WNL per pt  Neurological: Positive for dizziness and weakness.       Dr. Lewie Loron, f/u soon  Hematological:       Dr. Stephens November 1/  14 WNL per pt  Psychiatric/Behavioral: Positive for confusion.  All other systems reviewed and are negative.    BP 122/84  Pulse 76  Temp(Src) 97.8 F (36.6 C) (Temporal)  Resp 16  Ht 5\' 4"  (1.626 m)  Wt 144 lb (65.318 kg)  BMI 24.71 kg/m2     Objective:   Physical Exam  Nursing note and vitals reviewed. Constitutional: She is oriented to person, place, and time. She appears well-developed and well-nourished. No distress.  HENT:  Head: Normocephalic and atraumatic.  Right Ear: External ear normal.  Left Ear: External ear normal.  Nose: Nose normal.  Mouth/Throat: Oropharynx is clear and moist. No oropharyngeal exudate.  Eyes: Conjunctivae and EOM are normal. Pupils are equal, round, and reactive to light. No scleral icterus.  Neck: Normal range of motion. Neck supple. No JVD present. No tracheal deviation present. No thyromegaly present.  Cardiovascular: Normal rate, regular rhythm, normal heart sounds and intact distal pulses.   AFIB Chronic  Pulmonary/Chest: Effort normal and breath sounds normal.  Abdominal: Soft. Bowel sounds are normal. She exhibits no distension and no mass. There is no tenderness. There is no rebound and no guarding.  Genitourinary:  Breast WNL GYN def til 2016 WNL exam 2011  Musculoskeletal: Normal range of motion. She exhibits no edema and no tenderness.  Slow to change position using cain for ambulation. Unsteady gait.   Lymphadenopathy:    She has no cervical adenopathy.  Neurological: She is alert and oriented to person, place, and time. She has normal reflexes. No cranial nerve deficit.  Skin: Skin is warm and dry. No rash noted. No erythema. No pallor.  Psychiatric: She has a normal mood and affect. Her behavior is normal. Judgment and thought content normal.    EKG NSCSPT Afib      Assessment & Plan:  1. CPE and 3 month F/U for HTN, Cholesterol, Pre-Dm, D. Deficient check labs. Needs better diet and cardio qd. 2. Fatigue with  questionable off balance with Coreg, check labs. Decrease Coreg to 1/2 BID. Increase fluid intake. W/c with results. 3. Mild depression with death of husband, declines RX w/c if needs more assistance. Will try to do  Counseling with family/ church. 4. Urine incontinence- ? Infection vs bladder position vs spinal stenosis- Check labs, keep f/u with DR. Pneumalli, w/c if symptom increase.

## 2013-04-28 ENCOUNTER — Telehealth: Payer: Self-pay | Admitting: *Deleted

## 2013-04-28 NOTE — Telephone Encounter (Signed)
Message copied by Nicholaus Corolla A on Thu Apr 28, 2013  3:27 PM ------      Message from: [place], [place] R      Created: Thu Apr 28, 2013  1:28 PM       No urine infection. Cholesterol is horrible, needs very strict diet no meat or cheese, increase vegetables. Can add red yeast Rice extract 2 pills daily since declines cholesterol prescription. 3 month average of blood sugar is elevated need to increase chair or water exercise and decrease breads/ sweets. Needs 3 month office visit with Dr. Oneta Rack ------

## 2013-04-29 LAB — TB SKIN TEST
Induration: 0 mm
TB Skin Test: NEGATIVE

## 2013-05-04 ENCOUNTER — Ambulatory Visit (INDEPENDENT_AMBULATORY_CARE_PROVIDER_SITE_OTHER): Payer: Medicare Other | Admitting: Pharmacist

## 2013-05-04 DIAGNOSIS — I4891 Unspecified atrial fibrillation: Secondary | ICD-10-CM

## 2013-05-05 NOTE — Progress Notes (Signed)
I reviewed note and agree with plan.   Nivin Braniff R. Mahnoor Mathisen, MD  Certified in Neurology, Neurophysiology and Neuroimaging  [facility] [address], [address] [place], [place] [address] [phone]   

## 2013-05-31 ENCOUNTER — Ambulatory Visit (INDEPENDENT_AMBULATORY_CARE_PROVIDER_SITE_OTHER): Payer: Medicare Other | Admitting: Diagnostic Neuroimaging

## 2013-05-31 ENCOUNTER — Encounter: Payer: Self-pay | Admitting: Diagnostic Neuroimaging

## 2013-05-31 ENCOUNTER — Ambulatory Visit (INDEPENDENT_AMBULATORY_CARE_PROVIDER_SITE_OTHER): Payer: Medicare Other | Admitting: *Deleted

## 2013-05-31 VITALS — BP 123/75 | HR 85 | Ht 63.25 in | Wt 145.0 lb

## 2013-05-31 DIAGNOSIS — R269 Unspecified abnormalities of gait and mobility: Secondary | ICD-10-CM

## 2013-05-31 DIAGNOSIS — I4891 Unspecified atrial fibrillation: Secondary | ICD-10-CM

## 2013-05-31 LAB — POCT INR: INR: 1.8

## 2013-05-31 NOTE — Progress Notes (Signed)
[facility]  PATIENT: Pamela Whitaker DOB: 02/06/1933   REASON FOR VISIT: follow up HISTORY FROM: patient  HISTORY OF PRESENT ILLNESS:  UPDATE 05/31/12: Since last visit, symptoms stable. MRI lumbar spine, EMG/NCS and labs reviewed. Today, pt complains of right foot pain (metatarsalgia). She denies gait difficulty, but her daughter disagrees. Gait is still poor. No falls. Uses a cane. Not using walker.  UPDATE 03/28/13 (LL):  Ms. [name] returns for 1 month follow up visit.  Her MRI Brain and Cervical Spine did not show any reasons for her LE weakness.  Brain shows small vessel disease and normal for age atrophy. She is ambulating some better in office today, but overall her daughter thinks she is the same.  Her husband died on 04/19/23 after an extended illness.  She denies back pain or any problems with bowel or bladder.  Most of her pain is in right metatarsophalangeal joint of the great toe when walking, due to pressure exerted by valgus deformity.  PRIOR HPI (02/23/13): 80 year old right-handed female with atrial fibrillation, here for evaluation of gait and balance difficulty.   For past 5 years patient has had slow, progressive balance and gait difficulty. She had progressive difficulty picking up her feet and balancing. 3 years ago she felt her right knee buckle and she fell down. She had a left forearm fracture. Around that time she started using a cane. She's also noted to have weakness in her ankles with valgus deformity. One year ago she started using a walker.   Patient is seen orthopedic surgery for evaluation of her knee and ankles. Concern for a central nervous system process was raised due to significant gait difficulty out of proportion to her orthopedic issues.   Patient denies any bowel or bladder dysfunction. She denies any problems with her arms. She has mild neck pain which she attributes to helping lift her husband at home.   REVIEW OF SYSTEMS: Full  14 system review of systems performed and notable only for nothing.   ALLERGIES: Allergies  Allergen Reactions  . Amiodarone     Stopped on her own  . Lipitor [Atorvastatin]     weakness  . Multaq [Dronedarone]     Stopped on her own    HOME MEDICATIONS: Outpatient Prescriptions Prior to Visit  Medication Sig Dispense Refill  . Cholecalciferol (VITAMIN D) 2000 UNITS tablet Take 2,000 Units by mouth 2 (two) times daily.      . clonazePAM (KLONOPIN) 1 MG tablet Take 1 mg by mouth at bedtime as needed.       . Multiple Vitamin (MULTIVITAMIN) capsule Take 1 capsule by mouth daily. Women's Centrum      . warfarin (COUMADIN) 2.5 MG tablet Take 1 tablet (2.5 mg total) by mouth as directed.  30 tablet  3  . carvedilol (COREG) 12.5 MG tablet Take 1 tablet (12.5 mg total) by mouth 2 (two) times daily with a meal.  60 tablet  11   No facility-administered medications prior to visit.    PAST MEDICAL HISTORY: Past Medical History  Diagnosis Date  . Atrial fibrillation     treated with multaq x 6 mos in 2011....[name]=2 (Age; + CHF; no CVA; no DM2). Echo 05/24/10: EF 30% mild AS(mean 51mmgHg); mild LAE; mod LAE; PASP 35 Systolic CHF  . Cardiomyopathy     a. tachy mediated Myoview 06/06/10:  EF 31%; no scar or ischemia; EF up to 50% per echo November 2013  . Breast cancer  in 1987 with a left  mastectomy  . Osteoporosis     history of bone chips in the right knee,previous right fibular fracture, operated on by Dr. Veverly Fells  . RLS (restless legs syndrome)   . Lumbar stenosis   . Hypercholesteremia   . DJD (degenerative joint disease) of knee     right knee  . Falls   . Hypertension   . Vitamin D deficiency     PAST SURGICAL HISTORY: Past Surgical History  Procedure Laterality Date  . Knee arthroscopy  2005  . Mastectomy, radical  1987    left breast  . Breast reconstruction  1988    left breast  . Wrist fracture surgery      left wrist    FAMILY HISTORY: Family History    Problem Relation Age of Onset  . Heart attack Father   . Aortic aneurysm Mother   . Cancer Sister     breast cancer  . Diabetes Sister     SOCIAL HISTORY: History   Social History  . Marital Status: Married    Spouse Name: Chrissie Noa    Number of Children: 2  . Years of Education: college   Occupational History  . retired    Social History Main Topics  . Smoking status: Never Smoker   . Smokeless tobacco: Never Used  . Alcohol Use: No  . Drug Use: No  . Sexual Activity: No   Other Topics Concern  . Not on file   Social History Narrative  . No narrative on file     PHYSICAL EXAM  Filed Vitals:   05/31/13 1441  BP: 123/75  Pulse: 85  Height: 5' 3.25" (1.607 m)  Weight: 145 lb (65.772 kg)   Body mass index is 25.47 kg/(m^2).  Generalized: Well developed, in no acute distress  Cardiac: Regular rate rhythm, no murmur  Musculoskeletal: SIG VALGUS DEFORM OF ANKLES AND KNEES.   Neurological examination  MENTAL STATUS: awake, alert, language fluent, comprehension intact, naming intact  CRANIAL NERVE:  pupils equal and reactive to light, visual fields full to confrontation, extraocular muscles intact, no nystagmus, facial sensation and strength symmetric, uvula midline, shoulder shrug symmetric, tongue midline.  MOTOR: normal bulk and tone, full strength in the BUE, BLE (HF 3-4, OTHERWISE 5).  SENSORY: normal and symmetric to light touch  COORDINATION: finger-nose-finger, fine finger movements normal  REFLEXES: BUE 2, BLE 2 GAIT/STATION: VERY UNSTEADY. SLOW TO RISE. SLIDES AND SHUFFLES HER FEET. USING CANE.   DIAGNOSTIC DATA (LABS, IMAGING, TESTING) - I reviewed patient records, labs, notes, testing and imaging myself where available.  Lab Results  Component Value Date   WBC 6.6 04/26/2013   HGB 13.4 04/26/2013   HCT 39.5 04/26/2013   MCV 90.0 04/26/2013   PLT 116* 04/26/2013      Component Value Date/Time   NA 140 04/26/2013 1020   K 4.4 04/26/2013 1020   CL  104 04/26/2013 1020   CO2 30 04/26/2013 1020   GLUCOSE 93 04/26/2013 1020   BUN 12 04/26/2013 1020   CREATININE 0.65 04/26/2013 1020   CREATININE 0.65 06/29/2012 1159   CALCIUM 9.4 04/26/2013 1020   PROT 6.9 04/26/2013 1020   ALBUMIN 3.8 04/26/2013 1020   AST 17 04/26/2013 1020   ALT 10 04/26/2013 1020   ALKPHOS 56 04/26/2013 1020   BILITOT 0.7 04/26/2013 1020   GFRNONAA 83* 06/29/2012 1159   GFRAA >90 06/29/2012 1159    Lab Results  Component Value Date  SAYTKZSW10 627 04/26/2013   Lab Results  Component Value Date   TSH 1.931 04/26/2013    06/04/07 MRI lumbar spine  Negative for metastatic disease to the lumbar spine. Moderate to severe spinal stenosis at L4-5 due to disc protrusion and facet overgrowth. Foraminal narrowing is more severe on the left than the right. There is also a small lateral disc protrusion on the left at L3-4.   03/08/13 MRI Head Abnormal MRI scan of the brain showing mild changes of chronic microvascular ischemia and generalized cerebral atrophy.  03/08/13 MRI Cervical spine Mildly abnormal MRI scan of cervical spine showing mild degenerative changes mainly at C4-5 and C5-6 but without significant compression.  04/07/13 MRI lumbar spine 1. At L4-5: disc bulging, ligamentum flavum hypertrophy, facet arthropathy with severe spinal stenosis, severe right and moderate-severe left foraminal stenosis  2. At L3-4: disc bulging and facet hypertrophy with mild biforaminal foraminal stenosis  3. At L5-S1: disc bulging and facet hypertrophy, with small left synovial cyst (63mm), with no spinal stenosis or foraminal narrowing  4. Compared to MRI on 06/04/07, there has been mild progression of spinal stenosis at L4-5.   04/04/13 EMG/NCS  - possible bilateral L5 radiculopathies. No definite widespread large fiber neuropathy.   ASSESSMENT AND PLAN  80 y.o. female here with progressive gait difficulty over 5 years, much worse in last 1 year.   Dx: gait difficulty is likely due to  combination of lumbar spinal stenosis, valgus deformity of knees and ankles, and metatarsalgia in the right foot.   Patient and I are reluctant to pursue surgical evaluation at this time. Will opt for conservative management.  PLAN: Orders Placed This Encounter  Procedures  . Ambulatory referral to Physical Therapy   Return if symptoms worsen or fail to improve.    Penni Bombard, MD 01/26/2354, 7:32 PM Certified in Neurology, Neurophysiology and Neuroimaging  [facility] [address], [place], [place] [address] [phone]

## 2013-06-02 ENCOUNTER — Encounter: Payer: Self-pay | Admitting: Internal Medicine

## 2013-06-02 ENCOUNTER — Ambulatory Visit (INDEPENDENT_AMBULATORY_CARE_PROVIDER_SITE_OTHER): Payer: Medicare Other | Admitting: Internal Medicine

## 2013-06-02 VITALS — BP 126/86 | HR 80 | Temp 97.7°F | Resp 18 | Wt 144.6 lb

## 2013-06-02 DIAGNOSIS — J0410 Acute tracheitis without obstruction: Secondary | ICD-10-CM

## 2013-06-02 MED ORDER — HYDROCODONE-ACETAMINOPHEN 5-325 MG PO TABS: ORAL_TABLET | ORAL | Status: DC

## 2013-06-02 MED ORDER — AZITHROMYCIN 250 MG PO TABS: ORAL_TABLET | ORAL | Status: AC

## 2013-06-02 MED ORDER — BENZONATATE 100 MG PO CAPS: ORAL_CAPSULE | ORAL | Status: DC

## 2013-06-02 MED ORDER — PREDNISONE 20 MG PO TABS: 20.0000 mg | ORAL_TABLET | ORAL | Status: DC

## 2013-06-02 NOTE — Progress Notes (Signed)
   Subjective:    Patient ID: Pamela Whitaker, female    DOB: Feb 27, 1933, 80 y.o.   MRN: [id number]  Cough This is a new problem. The current episode started in the past 7 days. The problem has been gradually worsening. The problem occurs hourly. The cough is non-productive. Associated symptoms include chills, a fever, headaches, postnasal drip, rhinorrhea, a sore throat and shortness of breath. Pertinent negatives include no ear congestion or wheezing. Nothing aggravates the symptoms. She has tried OTC cough suppressant for the symptoms. The treatment provided no relief. Her past medical history is significant for bronchitis.  URI  Associated symptoms include congestion, coughing, headaches, rhinorrhea, sneezing and a sore throat. Pertinent negatives include no wheezing.      Review of Systems  Constitutional: Positive for fever, chills and fatigue.  HENT: Positive for congestion, postnasal drip, rhinorrhea, sinus pressure, sneezing and sore throat. Negative for ear discharge, facial swelling, mouth sores and trouble swallowing.   Eyes: Negative.   Respiratory: Positive for cough, chest tightness and shortness of breath. Negative for choking, wheezing and stridor.   Cardiovascular: Negative.   Gastrointestinal: Negative.   Neurological: Positive for headaches.       Objective:   Physical Exam  Constitutional: She is oriented to person, place, and time. She appears well-nourished.  HENT:  Head: Normocephalic and atraumatic.  Eyes: EOM are normal. Pupils are equal, round, and reactive to light.  Neck: Normal range of motion. Neck supple. No thyromegaly present.  Cardiovascular: Normal rate, regular rhythm and normal heart sounds.   No murmur heard. Pulmonary/Chest: No respiratory distress. She has wheezes. She has rales. She exhibits no tenderness.  Barking spastic cough  Musculoskeletal: Normal range of motion. She exhibits no edema.  Lymphadenopathy:    She has no cervical  adenopathy.  Neurological: She is alert and oriented to person, place, and time. No cranial nerve deficit. Coordination normal.  Skin: Skin is warm and dry. No rash noted. No erythema.          Assessment & Plan:  1. Acute tracheitis without mention of obstruction  Rx Zpak w/ i rf, Prednisone pulse/taper/ norco and tessalon for cough

## 2013-06-02 NOTE — Patient Instructions (Signed)

## 2013-06-09 ENCOUNTER — Ambulatory Visit (INDEPENDENT_AMBULATORY_CARE_PROVIDER_SITE_OTHER): Payer: 59 | Admitting: Emergency Medicine

## 2013-06-09 VITALS — BP 132/68 | HR 94 | Temp 98.4°F | Resp 16 | Ht 64.5 in | Wt 145.0 lb

## 2013-06-09 DIAGNOSIS — R05 Cough: Secondary | ICD-10-CM

## 2013-06-09 DIAGNOSIS — J40 Bronchitis, not specified as acute or chronic: Secondary | ICD-10-CM

## 2013-06-09 DIAGNOSIS — I4891 Unspecified atrial fibrillation: Secondary | ICD-10-CM

## 2013-06-09 DIAGNOSIS — R059 Cough, unspecified: Secondary | ICD-10-CM

## 2013-06-09 LAB — CBC WITH DIFFERENTIAL/PLATELET
BASOS ABS: 0 10*3/uL (ref 0.0–0.1)
Basophils Relative: 0 % (ref 0–1)
Eosinophils Absolute: 0 10*3/uL (ref 0.0–0.7)
Eosinophils Relative: 0 % (ref 0–5)
HEMATOCRIT: 40.4 % (ref 36.0–46.0)
HEMOGLOBIN: 14.1 g/dL (ref 12.0–15.0)
LYMPHS PCT: 9 % — AB (ref 12–46)
Lymphs Abs: 1.6 10*3/uL (ref 0.7–4.0)
MCH: 31 pg (ref 26.0–34.0)
MCHC: 34.9 g/dL (ref 30.0–36.0)
MCV: 88.8 fL (ref 78.0–100.0)
MONO ABS: 1 10*3/uL (ref 0.1–1.0)
Monocytes Relative: 5 % (ref 3–12)
Neutro Abs: 15.8 10*3/uL — ABNORMAL HIGH (ref 1.7–7.7)
Neutrophils Relative %: 86 % — ABNORMAL HIGH (ref 43–77)
Platelets: 243 10*3/uL (ref 150–400)
RBC: 4.55 MIL/uL (ref 3.87–5.11)
RDW: 13.6 % (ref 11.5–15.5)
WBC: 18.4 10*3/uL — ABNORMAL HIGH (ref 4.0–10.5)

## 2013-06-09 MED ORDER — DOXYCYCLINE HYCLATE 100 MG PO TABS: 100.0000 mg | ORAL_TABLET | Freq: Two times a day (BID) | ORAL | Status: DC

## 2013-06-09 MED ORDER — IPRATROPIUM-ALBUTEROL 0.5-2.5 (3) MG/3ML IN SOLN
3.0000 mL | RESPIRATORY_TRACT | Status: AC
  Administered 2013-06-09: 3 mL via RESPIRATORY_TRACT

## 2013-06-09 NOTE — Progress Notes (Signed)
Subjective:    Patient ID: Pamela Whitaker, female    DOB: February 17, 1933, 80 y.o.   MRN: [id number]  HPI Comments: 80 YO presents with continued cough concern. She finished Zpak and Prednisone for Bronchitis and has had increased chest production of green mucus over last 1-2 days. She is drinking plenty of H2o. She notes she is mildly fatigued.   She is on AFib prophylaxis with Coumadin. She denies bruising/ bleeding or changes with dosing.    Cough    Current Outpatient Prescriptions on File Prior to Visit  Medication Sig Dispense Refill  . benzonatate (TESSALON PERLES) 100 MG capsule Take 1 to 2 perles 3 x daily to prevent cough  60 capsule  1  . carvedilol (COREG) 12.5 MG tablet Take 12.5 mg by mouth daily. Take one half tablet twice daily.      . Cholecalciferol (VITAMIN D) 2000 UNITS tablet Take 2,000 Units by mouth 2 (two) times daily. Does not take regularly      . clonazePAM (KLONOPIN) 1 MG tablet Take 1 mg by mouth at bedtime as needed.       Marland Kitchen HYDROcodone-acetaminophen (NORCO) 5-325 MG per tablet 1/2 to 1 tablet every 3 to 4 hours for severe cough or pain  50 tablet  0  . Multiple Vitamin (MULTIVITAMIN) capsule Take 1 capsule by mouth daily. Women's Centrum      . predniSONE (DELTASONE) 20 MG tablet Take 1 tablet (20 mg total) by mouth See admin instructions. 1 tab 3 x day for 3 days, then 1 tab 2 x day for 3 days, then 1 tab 1 x day for 5 days  20 tablet  0  . warfarin (COUMADIN) 2.5 MG tablet Take 1 tablet (2.5 mg total) by mouth as directed.  30 tablet  3   No current facility-administered medications on file prior to visit.   ALLERGIES Amiodarone; Lipitor; and Multaq  Past Medical History  Diagnosis Date  . Atrial fibrillation     treated with multaq x 6 mos in 2011....[name]=2 (Age; + CHF; no CVA; no DM2). Echo 05/24/10: EF 30% mild AS(mean 58mmgHg); mild LAE; mod LAE; PASP 35 Systolic CHF  . Cardiomyopathy     a. tachy mediated Myoview 06/06/10:  EF 31%; no scar or  ischemia; EF up to 50% per echo November 2013  . Breast cancer      in 1987 with a left  mastectomy  . Osteoporosis     history of bone chips in the right knee,previous right fibular fracture, operated on by Dr. Veverly Fells  . RLS (restless legs syndrome)   . Lumbar stenosis   . Hypercholesteremia   . DJD (degenerative joint disease) of knee     right knee  . Falls   . Hypertension   . Vitamin D deficiency       Review of Systems  Constitutional: Positive for fatigue.  HENT: Positive for congestion.   Respiratory: Positive for cough.   All other systems reviewed and are negative.   BP 132/68  Pulse 94  Temp(Src) 98.4 F (36.9 C) (Temporal)  Resp 16  Ht 5' 4.5" (1.638 m)  Wt 145 lb (65.772 kg)  BMI 24.51 kg/m2  SpO2 94%     Objective:   Physical Exam  Nursing note and vitals reviewed. Constitutional: She is oriented to person, place, and time. She appears well-developed and well-nourished.  HENT:  Head: Normocephalic and atraumatic.  Right Ear: External ear normal.  Left Ear: External ear  normal.  Nose: Nose normal.  Mouth/Throat: Oropharynx is clear and moist. No oropharyngeal exudate.  Cloudy TMS  Eyes: Conjunctivae and EOM are normal.  Neck: Normal range of motion.  Cardiovascular: Normal rate, regular rhythm, normal heart sounds and intact distal pulses.   Pulmonary/Chest: Effort normal and breath sounds normal.  Congested cough, with improvement with NEB  Musculoskeletal: Normal range of motion.  Lymphadenopathy:    She has no cervical adenopathy.  Neurological: She is alert and oriented to person, place, and time.  Skin: Skin is warm and dry.  Psychiatric: She has a normal mood and affect. Judgment normal.          Assessment & Plan:  1. Cough/ bronchitis/ Allergic rhinitis- Allegra OTC, increase H2o, allergy hygiene explained. Duoneb in office with good improvement with cough. Finish Pred DP AD. Add Doxy 100 BId with food. w/c if SX increase or ER. 2.  Afib prophylaxis with multiple treatments recheck PTINR for adjustments  Check labs, Call office with any unusual bleeding/ bruising/ weakness

## 2013-06-09 NOTE — Patient Instructions (Signed)
Pneumonia, Adult Pneumonia is an infection of the lungs. It may be caused by a germ (virus or bacteria). Some types of pneumonia can spread easily from person to person. This can happen when you cough or sneeze. HOME CARE  Only take medicine as told by your doctor.  Take your medicine (antibiotics) as told. Finish it even if you start to feel better.  Do not smoke.  You may use a vaporizer or humidifier in your room. This can help loosen thick spit (mucus).  Sleep so you are almost sitting up (semi-upright). This helps reduce coughing.  Rest. A shot (vaccine) can help prevent pneumonia. Shots are often advised for:  People over 59 years old.  Patients on chemotherapy.  People with long-term (chronic) lung problems.  People with immune system problems. GET HELP RIGHT AWAY IF:   You are getting worse.  You cannot control your cough, and you are losing sleep.  You cough up blood.  Your pain gets worse, even with medicine.  You have a fever.  Any of your problems are getting worse, not better.  You have shortness of breath or chest pain. MAKE SURE YOU:   Understand these instructions.  Will watch your condition.  Will get help right away if you are not doing well or get worse. Document Released: 10/29/2007 Document Revised: 08/04/2011 Document Reviewed: 08/02/2010 [facility] Patient Information 2014 [place]. Bronchitis Bronchitis is swelling (inflammation) of the air tubes leading to your lungs (bronchi). This causes mucus and a cough. If the swelling gets bad, you may have trouble breathing. HOME CARE   Rest.  Drink enough fluids to keep your pee (urine) clear or pale yellow (unless you have a condition where you have to watch how much you drink).  Only take medicine as told by your doctor. If you were given antibiotic medicines, finish them even if you start to feel better.  Avoid smoke, irritating chemicals, and strong smells. These make the problem  worse. Quit smoking if you smoke. This helps your lungs heal faster.  Use a cool mist humidifier. Change the water in the humidifier every day. You can also sit in the bathroom with hot shower running for 5 10 minutes. Keep the door closed.  See your health care provider as told.  Wash your hands often. GET HELP IF: Your problems do not get better after 1 week. GET HELP RIGHT AWAY IF:   Your fever gets worse.  You have chills.  Your chest hurts.  Your problems breathing get worse.  You have blood in your mucus.  You pass out (faint).  You feel lightheaded.  You have a bad headache.  You throw up (vomit) again and again. MAKE SURE YOU:  Understand these instructions.  Will watch your condition.  Will get help right away if you are not doing well or get worse. Document Released: 10/29/2007 Document Revised: 03/02/2013 Document Reviewed: 01/04/2013 [facility] Patient Information 2014 [place], [place].

## 2013-06-10 ENCOUNTER — Telehealth: Payer: Self-pay

## 2013-06-10 LAB — PROTIME-INR
INR: 2.78 — AB (ref <1.50)
Prothrombin Time: 28.6 seconds — ABNORMAL HIGH (ref 11.6–15.2)

## 2013-06-10 NOTE — Telephone Encounter (Signed)
Pt started on zpak and prednisone taper on 06/02/13.  Pt had INR 2.78 and the primary care office told her to hold Coumadin x 2 doses then decrease dosage from 1 tablet daily to 1/2 tablet daily except 1 tablet on MWF.  Pt is also starting on Doxycycline 100mg  BID x 10 days.  Discussed with Elberta Leatherwood, Pharm D advised pt do not hold Coumadin as this will cause her INR to drop to sub therapeutic levels and to continue on same dosage of Coumadin 2.5mg  daily as we prescribed.  Made pt sooner f/u appt to check INR while on Doxycycline.  Appt made for 06/16/13 at 1:30pm pt aware of appt date and time and verbalized understanding of coumadin dosing instructions.

## 2013-06-13 ENCOUNTER — Encounter: Payer: Self-pay | Admitting: Emergency Medicine

## 2013-06-16 ENCOUNTER — Ambulatory Visit (INDEPENDENT_AMBULATORY_CARE_PROVIDER_SITE_OTHER): Payer: Medicare Other

## 2013-06-16 DIAGNOSIS — I4891 Unspecified atrial fibrillation: Secondary | ICD-10-CM

## 2013-06-16 LAB — POCT INR: INR: 6

## 2013-06-17 ENCOUNTER — Ambulatory Visit (INDEPENDENT_AMBULATORY_CARE_PROVIDER_SITE_OTHER): Payer: Medicare Other | Admitting: *Deleted

## 2013-06-17 DIAGNOSIS — D72829 Elevated white blood cell count, unspecified: Secondary | ICD-10-CM

## 2013-06-17 LAB — CBC WITH DIFFERENTIAL/PLATELET
Basophils Absolute: 0.1 10*3/uL (ref 0.0–0.1)
Basophils Relative: 1 % (ref 0–1)
Eosinophils Absolute: 0.1 10*3/uL (ref 0.0–0.7)
Eosinophils Relative: 1 % (ref 0–5)
HEMATOCRIT: 40.4 % (ref 36.0–46.0)
HEMOGLOBIN: 13.6 g/dL (ref 12.0–15.0)
LYMPHS PCT: 19 % (ref 12–46)
Lymphs Abs: 1.9 10*3/uL (ref 0.7–4.0)
MCH: 30.6 pg (ref 26.0–34.0)
MCHC: 33.7 g/dL (ref 30.0–36.0)
MCV: 91 fL (ref 78.0–100.0)
MONO ABS: 1 10*3/uL (ref 0.1–1.0)
MONOS PCT: 10 % (ref 3–12)
Neutro Abs: 6.9 10*3/uL (ref 1.7–7.7)
Neutrophils Relative %: 69 % (ref 43–77)
Platelets: 132 10*3/uL — ABNORMAL LOW (ref 150–400)
RBC: 4.44 MIL/uL (ref 3.87–5.11)
RDW: 13.7 % (ref 11.5–15.5)
WBC: 9.9 10*3/uL (ref 4.0–10.5)

## 2013-06-20 ENCOUNTER — Telehealth: Payer: Self-pay | Admitting: *Deleted

## 2013-06-20 NOTE — Telephone Encounter (Signed)
Spoke with patient and reviewed recent labs.  Patient has appointment with Dr Melford Aase in March 2015.

## 2013-06-21 ENCOUNTER — Ambulatory Visit: Payer: Medicare Other | Attending: Diagnostic Neuroimaging | Admitting: Physical Therapy

## 2013-06-21 ENCOUNTER — Ambulatory Visit: Payer: Medicare Other | Admitting: Physical Therapy

## 2013-06-21 DIAGNOSIS — R269 Unspecified abnormalities of gait and mobility: Secondary | ICD-10-CM | POA: Insufficient documentation

## 2013-06-21 DIAGNOSIS — IMO0001 Reserved for inherently not codable concepts without codable children: Secondary | ICD-10-CM | POA: Insufficient documentation

## 2013-06-23 ENCOUNTER — Ambulatory Visit (INDEPENDENT_AMBULATORY_CARE_PROVIDER_SITE_OTHER): Payer: Medicare Other

## 2013-06-23 DIAGNOSIS — Z5181 Encounter for therapeutic drug level monitoring: Secondary | ICD-10-CM

## 2013-06-23 DIAGNOSIS — I4891 Unspecified atrial fibrillation: Secondary | ICD-10-CM

## 2013-06-23 LAB — POCT INR: INR: 2.8

## 2013-06-28 ENCOUNTER — Ambulatory Visit: Payer: Medicare Other | Attending: Diagnostic Neuroimaging | Admitting: Physical Therapy

## 2013-06-28 DIAGNOSIS — IMO0001 Reserved for inherently not codable concepts without codable children: Secondary | ICD-10-CM | POA: Insufficient documentation

## 2013-06-28 DIAGNOSIS — R269 Unspecified abnormalities of gait and mobility: Secondary | ICD-10-CM | POA: Insufficient documentation

## 2013-06-29 ENCOUNTER — Encounter (HOSPITAL_COMMUNITY): Payer: Self-pay

## 2013-06-29 ENCOUNTER — Encounter (HOSPITAL_BASED_OUTPATIENT_CLINIC_OR_DEPARTMENT_OTHER): Payer: Medicare Other

## 2013-06-29 ENCOUNTER — Encounter (HOSPITAL_COMMUNITY): Payer: Medicare Other | Attending: Hematology and Oncology

## 2013-06-29 ENCOUNTER — Telehealth (HOSPITAL_COMMUNITY): Payer: Self-pay | Admitting: *Deleted

## 2013-06-29 VITALS — BP 113/76 | HR 80 | Temp 97.5°F | Resp 20 | Wt 147.6 lb

## 2013-06-29 DIAGNOSIS — I428 Other cardiomyopathies: Secondary | ICD-10-CM | POA: Insufficient documentation

## 2013-06-29 DIAGNOSIS — E559 Vitamin D deficiency, unspecified: Secondary | ICD-10-CM | POA: Insufficient documentation

## 2013-06-29 DIAGNOSIS — E7800 Pure hypercholesterolemia, unspecified: Secondary | ICD-10-CM | POA: Insufficient documentation

## 2013-06-29 DIAGNOSIS — E785 Hyperlipidemia, unspecified: Secondary | ICD-10-CM | POA: Insufficient documentation

## 2013-06-29 DIAGNOSIS — Z9181 History of falling: Secondary | ICD-10-CM | POA: Insufficient documentation

## 2013-06-29 DIAGNOSIS — D696 Thrombocytopenia, unspecified: Secondary | ICD-10-CM | POA: Insufficient documentation

## 2013-06-29 DIAGNOSIS — M48062 Spinal stenosis, lumbar region with neurogenic claudication: Secondary | ICD-10-CM | POA: Insufficient documentation

## 2013-06-29 DIAGNOSIS — I509 Heart failure, unspecified: Secondary | ICD-10-CM | POA: Insufficient documentation

## 2013-06-29 DIAGNOSIS — M1710 Unilateral primary osteoarthritis, unspecified knee: Secondary | ICD-10-CM | POA: Insufficient documentation

## 2013-06-29 DIAGNOSIS — Z09 Encounter for follow-up examination after completed treatment for conditions other than malignant neoplasm: Secondary | ICD-10-CM | POA: Insufficient documentation

## 2013-06-29 DIAGNOSIS — Z9010 Acquired absence of unspecified breast and nipple: Secondary | ICD-10-CM | POA: Insufficient documentation

## 2013-06-29 DIAGNOSIS — I4891 Unspecified atrial fibrillation: Secondary | ICD-10-CM | POA: Insufficient documentation

## 2013-06-29 DIAGNOSIS — Z853 Personal history of malignant neoplasm of breast: Secondary | ICD-10-CM | POA: Insufficient documentation

## 2013-06-29 DIAGNOSIS — I10 Essential (primary) hypertension: Secondary | ICD-10-CM | POA: Insufficient documentation

## 2013-06-29 DIAGNOSIS — C50919 Malignant neoplasm of unspecified site of unspecified female breast: Secondary | ICD-10-CM

## 2013-06-29 DIAGNOSIS — I5022 Chronic systolic (congestive) heart failure: Secondary | ICD-10-CM | POA: Insufficient documentation

## 2013-06-29 DIAGNOSIS — G2581 Restless legs syndrome: Secondary | ICD-10-CM | POA: Insufficient documentation

## 2013-06-29 DIAGNOSIS — M810 Age-related osteoporosis without current pathological fracture: Secondary | ICD-10-CM

## 2013-06-29 LAB — COMPREHENSIVE METABOLIC PANEL
ALK PHOS: 74 U/L (ref 39–117)
ALT: 14 U/L (ref 0–35)
AST: 16 U/L (ref 0–37)
Albumin: 3.5 g/dL (ref 3.5–5.2)
BILIRUBIN TOTAL: 0.3 mg/dL (ref 0.3–1.2)
BUN: 14 mg/dL (ref 6–23)
CHLORIDE: 103 meq/L (ref 96–112)
CO2: 27 meq/L (ref 19–32)
CREATININE: 0.69 mg/dL (ref 0.50–1.10)
Calcium: 9.7 mg/dL (ref 8.4–10.5)
GFR, EST NON AFRICAN AMERICAN: 81 mL/min — AB (ref >90)
GLUCOSE: 126 mg/dL — AB (ref 70–99)
POTASSIUM: 4.3 meq/L (ref 3.7–5.3)
Sodium: 140 mEq/L (ref 137–147)
Total Protein: 7.4 g/dL (ref 6.0–8.3)

## 2013-06-29 LAB — CBC WITH DIFFERENTIAL/PLATELET
BASOS ABS: 0 10*3/uL (ref 0.0–0.1)
Basophils Relative: 1 % (ref 0–1)
Eosinophils Absolute: 0.1 10*3/uL (ref 0.0–0.7)
Eosinophils Relative: 1 % (ref 0–5)
HEMATOCRIT: 38.9 % (ref 36.0–46.0)
HEMOGLOBIN: 13.1 g/dL (ref 12.0–15.0)
LYMPHS PCT: 24 % (ref 12–46)
Lymphs Abs: 1.7 10*3/uL (ref 0.7–4.0)
MCH: 31.6 pg (ref 26.0–34.0)
MCHC: 33.7 g/dL (ref 30.0–36.0)
MCV: 94 fL (ref 78.0–100.0)
MONO ABS: 0.4 10*3/uL (ref 0.1–1.0)
MONOS PCT: 6 % (ref 3–12)
NEUTROS ABS: 4.8 10*3/uL (ref 1.7–7.7)
Neutrophils Relative %: 68 % (ref 43–77)
Platelets: 206 10*3/uL (ref 150–400)
RBC: 4.14 MIL/uL (ref 3.87–5.11)
RDW: 12.6 % (ref 11.5–15.5)
WBC: 7 10*3/uL (ref 4.0–10.5)

## 2013-06-29 LAB — VITAMIN B12: Vitamin B-12: 854 pg/mL (ref 211–911)

## 2013-06-29 LAB — FOLATE: Folate: >20 ng/mL

## 2013-06-29 NOTE — Telephone Encounter (Signed)
Message copied by Joie Bimler on Wed Jun 29, 2013  3:27 PM ------      Message from: Farrel Gobble A      Created: Wed Jun 29, 2013  1:58 PM       Please call patient.  Tell her the drop in platelet count was due to Doxycycline as we had discussed.  Thanks. Dr.F ------

## 2013-06-29 NOTE — Patient Instructions (Signed)
[facility] Discharge Instructions  RECOMMENDATIONS MADE BY THE CONSULTANT AND ANY TEST RESULTS WILL BE SENT TO YOUR REFERRING PHYSICIAN.  EXAM FINDINGS BY THE PHYSICIAN TODAY AND SIGNS OR SYMPTOMS TO REPORT TO CLINIC OR PRIMARY PHYSICIAN: Exam and findings as discussed by Dr. Barnet Glasgow.  MEDICATIONS PRESCRIBED:  None  INSTRUCTIONS/FOLLOW-UP: Return in one year for lab work and MD appointment.   Thank you for choosing [facility] to provide your oncology and hematology care.  To afford each patient quality time with our providers, please arrive at least 15 minutes before your scheduled appointment time.  With your help, our goal is to use those 15 minutes to complete the necessary work-up to ensure our physicians have the information they need to help with your evaluation and healthcare recommendations.    Effective January 1st, 2014, we ask that you re-schedule your appointment with our physicians should you arrive 10 or more minutes late for your appointment.  We strive to give you quality time with our providers, and arriving late affects you and other patients whose appointments are after yours.    Again, thank you for choosing [facility].  Our hope is that these requests will decrease the amount of time that you wait before being seen by our physicians.       _____________________________________________________________  Should you have questions after your visit to [facility], please contact our office at [phone] between the hours of 8:30 a.m. and 5:00 p.m.  Voicemails left after 4:30 p.m. will not be returned until the following business day.  For prescription refill requests, have your pharmacy contact our office with your prescription refill request.

## 2013-06-29 NOTE — Progress Notes (Signed)
[facility]  OFFICE PROGRESS NOTE  Alesia Richards, MD [address] [place] [address]  DIAGNOSIS: Thrombocytopenia - Plan: CBC with Differential, Vitamin B12, Folate, Platelet antibodies, direct, CANCELED: Plt glycoprotein (indirect) autoabs  Breast CA - Plan: Comprehensive metabolic panel, CEA, Cancer antigen 27.29, Comprehensive metabolic panel, CEA, Cancer antigen 27.29  Chief Complaint  Patient presents with  . Follow-up  . Breast Cancer    CURRENT THERAPY: Watchful expectation  INTERVAL HISTORY: Pamela Whitaker 80 y.o. female returns for followup of left breast cancer, stage II, diagnosed in 1987 followed by one year of CAFVP chemotherapy followed by endocrine treatment for 12 months that'll is not due to intolerance including aromatase inhibitor, megestrol, and tamoxifen. She is currently being seen in yearly followup.   MEDICAL HISTORY: Past Medical History  Diagnosis Date  . Atrial fibrillation     treated with multaq x 6 mos in 2011....[name]=2 (Age; + CHF; no CVA; no DM2). Echo 05/24/10: EF 30% mild AS(mean 69mgHg); mild LAE; mod LAE; PASP 35 Systolic CHF  . Cardiomyopathy     a. tachy mediated Myoview 06/06/10:  EF 31%; no scar or ischemia; EF up to 50% per echo November 2013  . Breast cancer      in 1987 with a left  mastectomy  . Osteoporosis     history of bone chips in the right knee,previous right fibular fracture, operated on by Dr. NVeverly Fells . RLS (restless legs syndrome)   . Lumbar stenosis   . Hypercholesteremia   . DJD (degenerative joint disease) of knee     right knee  . Falls   . Hypertension   . Vitamin D deficiency     INTERIM HISTORY: has HYPERLIPIDEMIA; RESTLESS LEGS SYNDROME; Atrial fibrillation; OSTEOARTHRITIS; CARDIOMYOPATHY; UNSPECIFIED SYSTOLIC HEART FAILURE; Gait difficulty; Hyperreflexia; Spinal stenosis, lumbar region, with neurogenic claudication; Hypertension; Vitamin  D deficiency; and Encounter for therapeutic drug monitoring on her problem list.   Stage III adenocarcinoma of the breast surgery on April 10, 1986 consisting of a left mastectomy with reconstruction by Dr. HDonnie Coffinis for 6.5 cm cancer with 2 of 27 positive lymph nodes status post CAF VP chemotherapy. She took chemotherapy for a year followed by hormonal therapy for 12 months but was stopped due to intolerance. At that time, we had tried Megace and tamoxifen and she opted not to try anything further. Her husband of 556years died in O11/02/14 She does not practice self breast examination. She denies any fever, night sweats, but does have bilateral lower 70 weakness for which he uses a cane and wears a brace is around her knees. She had a recent bout of bronchitis treated initially with a Z-Pak and then with doxycycline. Her platelet count dropped as a result of the second drug and additional testing will be done today to see whether or not this hypothesis is correct. She denies any diarrhea, constipation, but does have urinary urgency. She denies any nocturia, skin rash, headache, or seizures. Appetite has been good with no nausea, vomiting, diarrhea, constipation, or incontinence.  ALLERGIES:  is allergic to amiodarone; lipitor; and multaq.  MEDICATIONS: has a current medication list which includes the following prescription(s): carvedilol, vitamin d, clonazepam, multivitamin, warfarin, and hydrocodone-acetaminophen.  SURGICAL HISTORY:  Past Surgical History  Procedure Laterality Date  . Knee arthroscopy  2005  . Mastectomy, radical  1987    left breast  . Breast reconstruction  1988    left breast  . Wrist fracture surgery      left wrist    FAMILY HISTORY: family history includes Aortic aneurysm in her mother; Cancer in her sister; Diabetes in her sister; Heart attack in her father.  SOCIAL HISTORY:  reports that she has never smoked. She has never used smokeless tobacco.  She reports that she does not drink alcohol or use illicit drugs.  REVIEW OF SYSTEMS:  Other than that discussed above is noncontributory.  PHYSICAL EXAMINATION: ECOG PERFORMANCE STATUS: 1 - Symptomatic but completely ambulatory  Blood pressure 113/76, pulse 80, temperature 97.5 F (36.4 C), temperature source Oral, resp. rate 20, weight 147 lb 9.6 oz (66.951 kg).  GENERAL:alert, no distress and comfortable SKIN: skin color, texture, turgor are normal, no rashes or significant lesions EYES: PERLA; Conjunctiva are pink and non-injected, sclera clear OROPHARYNX:no exudate, no erythema on lips, buccal mucosa, or tongue. NECK: supple, thyroid normal size, non-tender, without nodularity. No masses CHEST: Status post left breast reconstruction. No masses in the right breast. LYMPH:  no palpable lymphadenopathy in the cervical, axillary or inguinal LUNGS: clear to auscultation and percussion with normal breathing effort HEART:  irregularly irregular with no S3.  ABDOMEN:abdomen soft, non-tender and normal bowel sounds MUSCULOSKELETAL:no cyanosis of digits and no clubbing. Range of motion normal. Right lower extremity weakness greater than left with buckling at the right knee. NEURO: alert & oriented x 3 with fluent speech, no focal motor/sensory deficits   LABORATORY DATA: Infusion on 06/29/2013  Component Date Value Range Status  . WBC 06/29/2013 7.0  4.0 - 10.5 K/uL Final  . RBC 06/29/2013 4.14  3.87 - 5.11 MIL/uL Final  . Hemoglobin 06/29/2013 13.1  12.0 - 15.0 g/dL Final  . HCT 06/29/2013 38.9  36.0 - 46.0 % Final  . MCV 06/29/2013 94.0  78.0 - 100.0 fL Final  . MCH 06/29/2013 31.6  26.0 - 34.0 pg Final  . MCHC 06/29/2013 33.7  30.0 - 36.0 g/dL Final  . RDW 06/29/2013 12.6  11.5 - 15.5 % Final  . Platelets 06/29/2013 206  150 - 400 K/uL Final  . Neutrophils Relative % 06/29/2013 68  43 - 77 % Final  . Neutro Abs 06/29/2013 4.8  1.7 - 7.7 K/uL Final  . Lymphocytes Relative  06/29/2013 24  12 - 46 % Final  . Lymphs Abs 06/29/2013 1.7  0.7 - 4.0 K/uL Final  . Monocytes Relative 06/29/2013 6  3 - 12 % Final  . Monocytes Absolute 06/29/2013 0.4  0.1 - 1.0 K/uL Final  . Eosinophils Relative 06/29/2013 1  0 - 5 % Final  . Eosinophils Absolute 06/29/2013 0.1  0.0 - 0.7 K/uL Final  . Basophils Relative 06/29/2013 1  0 - 1 % Final  . Basophils Absolute 06/29/2013 0.0  0.0 - 0.1 K/uL Final  Office Visit on 06/29/2013  Component Date Value Range Status  . Sodium 06/29/2013 140  137 - 147 mEq/L Final  . Potassium 06/29/2013 4.3  3.7 - 5.3 mEq/L Final  . Chloride 06/29/2013 103  96 - 112 mEq/L Final  . CO2 06/29/2013 27  19 - 32 mEq/L Final  . Glucose, Bld 06/29/2013 126* 70 - 99 mg/dL Final  . BUN 06/29/2013 14  6 - 23 mg/dL Final  . Creatinine, Ser 06/29/2013 0.69  0.50 - 1.10 mg/dL Final  . Calcium 06/29/2013 9.7  8.4 - 10.5 mg/dL Final  . Total Protein 06/29/2013 7.4  6.0 - 8.3 g/dL Final  .  Albumin 06/29/2013 3.5  3.5 - 5.2 g/dL Final  . AST 06/29/2013 16  0 - 37 U/L Final  . ALT 06/29/2013 14  0 - 35 U/L Final  . Alkaline Phosphatase 06/29/2013 74  39 - 117 U/L Final  . Total Bilirubin 06/29/2013 0.3  0.3 - 1.2 mg/dL Final  . GFR calc non Af Amer 06/29/2013 81* >90 mL/min Final  . GFR calc Af Amer 06/29/2013 >90  >90 mL/min Final   Comment: (NOTE)                          The eGFR has been calculated using the CKD EPI equation.                          This calculation has not been validated in all clinical situations.                          eGFR's persistently <90 mL/min signify possible Chronic Kidney                          Disease.  Anti-coag visit on 06/23/2013  Component Date Value Range Status  . INR 06/23/2013 2.8   Final  Clinical Support on 06/17/2013  Component Date Value Range Status  . WBC 06/17/2013 9.9  4.0 - 10.5 K/uL Final  . RBC 06/17/2013 4.44  3.87 - 5.11 MIL/uL Final  . Hemoglobin 06/17/2013 13.6  12.0 - 15.0 g/dL Final  . HCT  06/17/2013 40.4  36.0 - 46.0 % Final  . MCV 06/17/2013 91.0  78.0 - 100.0 fL Final  . MCH 06/17/2013 30.6  26.0 - 34.0 pg Final  . MCHC 06/17/2013 33.7  30.0 - 36.0 g/dL Final  . RDW 06/17/2013 13.7  11.5 - 15.5 % Final  . Platelets 06/17/2013 132* 150 - 400 K/uL Final  . Neutrophils Relative % 06/17/2013 69  43 - 77 % Final  . Neutro Abs 06/17/2013 6.9  1.7 - 7.7 K/uL Final  . Lymphocytes Relative 06/17/2013 19  12 - 46 % Final  . Lymphs Abs 06/17/2013 1.9  0.7 - 4.0 K/uL Final  . Monocytes Relative 06/17/2013 10  3 - 12 % Final  . Monocytes Absolute 06/17/2013 1.0  0.1 - 1.0 K/uL Final  . Eosinophils Relative 06/17/2013 1  0 - 5 % Final  . Eosinophils Absolute 06/17/2013 0.1  0.0 - 0.7 K/uL Final  . Basophils Relative 06/17/2013 1  0 - 1 % Final  . Basophils Absolute 06/17/2013 0.1  0.0 - 0.1 K/uL Final  . Smear Review 06/17/2013 Criteria for review not met   Final  Anti-coag visit on 06/16/2013  Component Date Value Range Status  . INR 06/16/2013 6.0   Final  Office Visit on 06/09/2013  Component Date Value Range Status  . WBC 06/09/2013 18.4* 4.0 - 10.5 K/uL Final  . RBC 06/09/2013 4.55  3.87 - 5.11 MIL/uL Final  . Hemoglobin 06/09/2013 14.1  12.0 - 15.0 g/dL Final  . HCT 06/09/2013 40.4  36.0 - 46.0 % Final  . MCV 06/09/2013 88.8  78.0 - 100.0 fL Final  . MCH 06/09/2013 31.0  26.0 - 34.0 pg Final  . MCHC 06/09/2013 34.9  30.0 - 36.0 g/dL Final  . RDW 06/09/2013 13.6  11.5 - 15.5 % Final  . Platelets 06/09/2013 243  150 -  400 K/uL Final  . Neutrophils Relative % 06/09/2013 86* 43 - 77 % Final  . Neutro Abs 06/09/2013 15.8* 1.7 - 7.7 K/uL Final  . Lymphocytes Relative 06/09/2013 9* 12 - 46 % Final  . Lymphs Abs 06/09/2013 1.6  0.7 - 4.0 K/uL Final  . Monocytes Relative 06/09/2013 5  3 - 12 % Final  . Monocytes Absolute 06/09/2013 1.0  0.1 - 1.0 K/uL Final  . Eosinophils Relative 06/09/2013 0  0 - 5 % Final  . Eosinophils Absolute 06/09/2013 0.0  0.0 - 0.7 K/uL Final  .  Basophils Relative 06/09/2013 0  0 - 1 % Final  . Basophils Absolute 06/09/2013 0.0  0.0 - 0.1 K/uL Final  . Smear Review 06/09/2013 Criteria for review not met   Final  . Prothrombin Time 06/09/2013 28.6* 11.6 - 15.2 seconds Final  . INR 06/09/2013 2.78* <1.50 Final   Comment: The INR is of principal utility in following patients on stable doses                          of oral anticoagulants.  The therapeutic range is generally 2.0 to                          3.0, but may be 3.0 to 4.0 in patients with mechanical cardiac valves,                          recurrent embolisms and antiphospholipid antibodies (including lupus                          inhibitors).  Anti-coag visit on 05/31/2013  Component Date Value Range Status  . INR 05/31/2013 1.8   Final    PATHOLOGY: No new pathology.  Urinalysis    Component Value Date/Time   COLORURINE YELLOW 04/26/2013 [place] 04/26/2013 1035   LABSPEC 1.015 04/26/2013 1035   PHURINE 6.5 04/26/2013 1035   GLUCOSEU NEG 04/26/2013 1035   HGBUR TRACE* 04/26/2013 1035   BILIRUBINUR NEG 04/26/2013 1035   KETONESUR NEG 04/26/2013 1035   PROTEINUR NEG 04/26/2013 1035   UROBILINOGEN 0.2 04/26/2013 1035   NITRITE NEG 04/26/2013 1035   LEUKOCYTESUR TRACE* 04/26/2013 1035    RADIOGRAPHIC STUDIES:   Mammogram done in 2014 was unremarkable.   MR Lumbar Spine Wo Contrast Status: Final result         PACS Images    Show images for MR Lumbar Spine Wo Contrast         Study Result    [facility]  NEUROIMAGING REPORT   STUDY DATE: 04/07/13  PATIENT NAME: Pamela Whitaker  DOB: 22-Nov-1932  MRN: [id number]  ORDERING CLINICIAN: Charlott Holler, NP / Andrey Spearman, MD  CLINICAL HISTORY: 80 year old female with lumbar spinal stenosis and gait difficulty.  EXAM: MRI lumbar spine (without)  TECHNIQUE: MRI of the lumbar spine was obtained utilizing 4 mm sagittal slices from O03-70 down to the lower sacrum with T1, T2 and  inversion recovery views. In addition 4 mm axial slices from W8-8 down to L5-S1 level were included with T1 and T2 weighted views.  CONTRAST: no  IMAGING SITE: [facility] [address] (1.5 Tesla MRI)  FINDINGS:  On sagittal views the vertebral bodies have normal height. Mild posterior spondylolisthesis of L3 on L4 (3m).  Disc bulging and spondylosis from L1-2 to L5-S1. L4-5 disc protrusion. Scoliosis convex to the left centered at L3-4. The conus medullaris terminates at the level of L1.  On axial views:  L1-2: disc bulging with no spinal stenosis or foraminal narrowing  L2-3: disc bulging with no spinal stenosis or foraminal narrowing  L3-4: disc bulging and facet hypertrophy with mild biforaminal foraminal stenosis  L4-5: disc bulging, ligamentum flavum hypertrophy, facet arthropathy with severe spinal stenosis, severe right and moderate-severe left foraminal stenosis  L5-S1: disc bulging and facet hypertrophy, with small left synovial cyst (68m), with no spinal stenosis or foraminal narrowing  Limited views of the aorta, kidneys, iliopsoas muscles and sacroiliac joints are notable for left renal cyst (692m and sacroiliac joint arthopathy.  IMPRESSION:  Abnormal MRI lumbar spine (without) demonstrating:  1. At L4-5: disc bulging, ligamentum flavum hypertrophy, facet arthropathy with severe spinal stenosis, severe right and moderate-severe left foraminal stenosis  2. At L3-4: disc bulging and facet hypertrophy with mild biforaminal foraminal stenosis  3. At L5-S1: disc bulging and facet hypertrophy, with small left synovial cyst (70m83m with no spinal stenosis or foraminal narrowing  4. Compared to MRI on 06/04/07, there has been mild progression of spinal stenosis at L4-5.   INTERPRETING PHYSICIAN:  VIKPenni BombardD  Certified in Neurology, Neurophysiology and Neuroimaging  [facility]  [address] [phone]   ASSESSMENT:  #1.Stage III adenocarcinoma of the breast surgery on April 10, 1986 consisting of a left mastectomy with reconstruction by Dr. HowDonnie Coffin for 6.5 cm cancer with 2 of 27 positive lymph nodes status post CAF VP chemotherapy. She took chemotherapy for a year followed by hormonal therapy for 12 months but was stopped due to intolerance. At that time, we had tried Megace and tamoxifen and she opted not to try anything further, no evidence of disease pending today's lab reports. #2. Lumbosacral disc disease with lower extremity weakness, right greater than left. #3. Chronic atrial fibrillation on warfarin. #4. Osteopenia. #5. Hypercholesterolemia, on treatment. #6. Recent bout of bronchitis and drop in platelet count over 2 weeks, probably due to doxycycline. Normal platelet count today.     PLAN:  #1. Repeat CBC along with measurement of ANA, folic acid, B12D62nd antiplatelet antibody. #2. Patient will be called tomorrow with results. #3. Continue current medications. #4. Followup in one year with CBC, chem profile, CEA, and CA 27-29. The platelet count remains low, will see the patient again in approximately 1 month.   All questions were answered. The patient knows to call the clinic with any problems, questions or concerns. We can certainly see the patient much sooner if necessary.   I spent 25 minutes counseling the patient face to face. The total time spent in the appointment was 30 minutes.    ForDoroteo BradfordD 06/29/2013 1:57 PM

## 2013-06-29 NOTE — Progress Notes (Signed)
Labs drawn today for cmp,cea,folate,cbc/diff,ca2729,b12, plt antibodies

## 2013-06-30 LAB — CANCER ANTIGEN 27.29: CA 27.29: 24 U/mL (ref 0–39)

## 2013-06-30 LAB — CEA: CEA: 0.7 ng/mL (ref 0.0–5.0)

## 2013-07-01 ENCOUNTER — Telehealth (HOSPITAL_COMMUNITY): Payer: Self-pay

## 2013-07-01 NOTE — Telephone Encounter (Signed)
Call back from patient and test results discussed.

## 2013-07-04 ENCOUNTER — Other Ambulatory Visit (HOSPITAL_COMMUNITY): Payer: Self-pay | Admitting: Oncology

## 2013-07-04 DIAGNOSIS — Z139 Encounter for screening, unspecified: Secondary | ICD-10-CM

## 2013-07-05 ENCOUNTER — Encounter: Payer: Self-pay | Admitting: Podiatry

## 2013-07-05 ENCOUNTER — Ambulatory Visit (INDEPENDENT_AMBULATORY_CARE_PROVIDER_SITE_OTHER): Payer: Medicare Other

## 2013-07-05 ENCOUNTER — Ambulatory Visit (INDEPENDENT_AMBULATORY_CARE_PROVIDER_SITE_OTHER): Payer: Medicare Other | Admitting: Podiatry

## 2013-07-05 ENCOUNTER — Ambulatory Visit: Payer: Medicare Other | Admitting: Physical Therapy

## 2013-07-05 VITALS — BP 152/94 | HR 89 | Resp 17 | Ht 64.0 in | Wt 146.0 lb

## 2013-07-05 DIAGNOSIS — IMO0002 Reserved for concepts with insufficient information to code with codable children: Secondary | ICD-10-CM

## 2013-07-05 DIAGNOSIS — M792 Neuralgia and neuritis, unspecified: Secondary | ICD-10-CM

## 2013-07-05 DIAGNOSIS — M79609 Pain in unspecified limb: Secondary | ICD-10-CM

## 2013-07-05 DIAGNOSIS — L988 Other specified disorders of the skin and subcutaneous tissue: Secondary | ICD-10-CM

## 2013-07-05 NOTE — Progress Notes (Signed)
Pt states the pain in her Right 1st MPJ, has been going on for years.  Pt states had been wearing a ankle brace, but stopped when she broke her wrist..  Objective: I have reviewed her past medical history medications allergies surgeries and social histories they're in the chart. Pulses are palpable bilateral. Neurologic sensorium is intact per Semmes-Weinstein monofilament. Deep tendon reflexes are intact. Muscle strength is weak on the right but normal on the left side. Orthopedic evaluation demonstrates all joints distal to the ankle a full range of motion without crepitation. Cutaneous evaluation demonstrates fissure and skin fold to the plantar medial aspect of the first metatarsophalangeal joint where the majority of her pain is located upon palpation. There is no erythema edema cellulitis drainage or odor associated with this. Radiographic evaluation does not demonstrate any type of osseous abnormality in this area.  Assessment: Fissure foot with neuritis first metatarsophalangeal joint right foot.  Plan: Discussed having a plastic surgeon fill the fissure with collagen and I also suggested an injection with dexamethasone which she took. After the injection she stated that she felt 100% better. She's she come back complaining of a similar problem I would consider a chemical neurolysed this to this area.

## 2013-07-06 LAB — PLATELET ANTIBODIES, DIRECT

## 2013-07-07 ENCOUNTER — Other Ambulatory Visit (HOSPITAL_COMMUNITY): Payer: Self-pay | Admitting: Oncology

## 2013-07-07 DIAGNOSIS — Z1231 Encounter for screening mammogram for malignant neoplasm of breast: Secondary | ICD-10-CM

## 2013-07-08 ENCOUNTER — Ambulatory Visit: Payer: Medicare Other | Admitting: Physical Therapy

## 2013-07-08 ENCOUNTER — Ambulatory Visit (HOSPITAL_COMMUNITY)
Admission: RE | Admit: 2013-07-08 | Discharge: 2013-07-08 | Disposition: A | Discharge status: Home / Self Care | Payer: Medicare Other | Source: Ambulatory Visit | Attending: Oncology | Admitting: Oncology

## 2013-07-08 DIAGNOSIS — Z1231 Encounter for screening mammogram for malignant neoplasm of breast: Secondary | ICD-10-CM | POA: Insufficient documentation

## 2013-07-12 ENCOUNTER — Encounter: Payer: Medicare Other | Admitting: Physical Therapy

## 2013-07-14 ENCOUNTER — Ambulatory Visit: Payer: Medicare Other | Admitting: Physical Therapy

## 2013-07-19 ENCOUNTER — Encounter: Payer: Medicare Other | Admitting: Physical Therapy

## 2013-07-20 ENCOUNTER — Other Ambulatory Visit: Payer: Self-pay | Admitting: Cardiovascular Disease

## 2013-07-21 ENCOUNTER — Encounter: Payer: Medicare Other | Admitting: Physical Therapy

## 2013-07-25 ENCOUNTER — Ambulatory Visit: Payer: Medicare Other | Attending: Diagnostic Neuroimaging | Admitting: Physical Therapy

## 2013-07-25 DIAGNOSIS — IMO0001 Reserved for inherently not codable concepts without codable children: Secondary | ICD-10-CM | POA: Insufficient documentation

## 2013-07-25 DIAGNOSIS — R269 Unspecified abnormalities of gait and mobility: Secondary | ICD-10-CM | POA: Insufficient documentation

## 2013-07-26 ENCOUNTER — Ambulatory Visit: Payer: Medicare Other | Admitting: Physical Therapy

## 2013-07-26 ENCOUNTER — Ambulatory Visit (INDEPENDENT_AMBULATORY_CARE_PROVIDER_SITE_OTHER): Payer: Medicare Other | Admitting: Pharmacist

## 2013-07-26 DIAGNOSIS — I4891 Unspecified atrial fibrillation: Secondary | ICD-10-CM

## 2013-07-26 DIAGNOSIS — Z5181 Encounter for therapeutic drug level monitoring: Secondary | ICD-10-CM

## 2013-07-26 LAB — POCT INR: INR: 2.4

## 2013-08-02 ENCOUNTER — Ambulatory Visit (INDEPENDENT_AMBULATORY_CARE_PROVIDER_SITE_OTHER): Payer: Medicare Other | Admitting: Cardiovascular Disease

## 2013-08-02 ENCOUNTER — Encounter: Payer: Self-pay | Admitting: Cardiovascular Disease

## 2013-08-02 ENCOUNTER — Ambulatory Visit: Payer: Medicare Other | Admitting: Physical Therapy

## 2013-08-02 VITALS — BP 123/71 | HR 93 | Ht 64.0 in | Wt 148.0 lb

## 2013-08-02 DIAGNOSIS — I4891 Unspecified atrial fibrillation: Secondary | ICD-10-CM

## 2013-08-02 DIAGNOSIS — I10 Essential (primary) hypertension: Secondary | ICD-10-CM

## 2013-08-02 DIAGNOSIS — I428 Other cardiomyopathies: Secondary | ICD-10-CM

## 2013-08-02 MED ORDER — CARVEDILOL 6.25 MG PO TABS: 6.2500 mg | ORAL_TABLET | Freq: Two times a day (BID) | ORAL | Status: DC

## 2013-08-02 NOTE — Patient Instructions (Signed)
Your physician wants you to follow-up in:  6 MONTHS WITH DR NISHAN  You will receive a reminder letter in the mail two months in advance. If you don't receive a letter, please call our office to schedule the follow-up appointment. Your physician recommends that you continue on your current medications as directed. Please refer to the Current Medication list given to you today. 

## 2013-08-02 NOTE — Assessment & Plan Note (Signed)
Well controlled.  Continue current medications and low sodium Dash type diet.    

## 2013-08-02 NOTE — Assessment & Plan Note (Signed)
Good rate contorl and anticoagulation

## 2013-08-02 NOTE — Progress Notes (Signed)
Patient ID: Pamela Whitaker, female   DOB: 13-Oct-1932, 80 y.o.   MRN: [id number] Pamela Whitaker is seen back today for a 2 week check. She is seen for Dr. Johnsie Cancel. She has a history of PAF. No longer on Multaq. Stopped amiodarone in the past as well. Managed with rate control and anticoagulation with coumadin. She has had a reduced EF of 30% in the past.  I saw her 2 weeks ago after she had been to her PCP. EKG there showed atrial fib with frequent PVC's. She has missed some doses of her Metoprolol. Falling. We checked a CT scan of her head. She remains on Coumadin. We updated her echo which shows improvement and placed a Holter.  She comes back today. She is here with her daughter. She says she has not fallen since she was last here. Still very unsteady due to this deformity of her feet. Still forgets her evening dose of Metoprolol. Still just using a cane and not a walker as advised. Has multiple questions about her rhythm. Does not understand the reason for the way her atrial fib is managed.   Primary decreased coreg to 6.25 bid for dizzyness  Might be better Husband died 2 months ago    ROS: Denies fever, malais, weight loss, blurry vision, decreased visual acuity, cough, sputum, SOB, hemoptysis, pleuritic pain, palpitaitons, heartburn, abdominal pain, melena, lower extremity edema, claudication, or rash.  All other systems reviewed and negative  General: Affect appropriate Healthy:  appears stated age 47: normal Neck supple with no adenopathy JVP normal no bruits no thyromegaly Lungs clear with no wheezing and good diaphragmatic motion Heart:  S1/S2 no murmur, no rub, gallop or click PMI normal Abdomen: benighn, BS positve, no tenderness, no AAA no bruit.  No HSM or HJR Distal pulses intact with no bruits No edema Neuro non-focal Skin warm and dry No muscular weakness   Current Outpatient Prescriptions  Medication Sig Dispense Refill  . carvedilol (COREG) 12.5 MG tablet Take 12.5 mg  by mouth daily. Take one half tablet twice daily.      . Cholecalciferol (VITAMIN D) 2000 UNITS tablet Take 2,000 Units by mouth 2 (two) times daily. Does not take regularly      . clonazePAM (KLONOPIN) 1 MG tablet Take 1 mg by mouth at bedtime as needed.       . Multiple Vitamin (MULTIVITAMIN) capsule Take 1 capsule by mouth daily. Women's Centrum      . warfarin (COUMADIN) 2.5 MG tablet TAKE 1 TABLET (2.5 MG TOTAL) BY MOUTH AS DIRECTED.  30 tablet  3   No current facility-administered medications for this visit.    Allergies  Amiodarone; Lipitor; and Multaq  Electrocardiogram:  afib rate 79 nonspecific St/T wave changes   Assessment and Plan

## 2013-08-02 NOTE — Assessment & Plan Note (Signed)
Last echo 2013 improved 50-55%  Continue medical Rx although she tends to be noncompliant

## 2013-08-08 ENCOUNTER — Ambulatory Visit: Payer: Medicare Other | Admitting: Physical Therapy

## 2013-08-11 ENCOUNTER — Ambulatory Visit: Payer: Medicare Other | Admitting: Physical Therapy

## 2013-08-15 ENCOUNTER — Ambulatory Visit: Payer: Medicare Other | Admitting: Physical Therapy

## 2013-08-18 ENCOUNTER — Encounter: Payer: Self-pay | Admitting: Internal Medicine

## 2013-08-18 ENCOUNTER — Ambulatory Visit (INDEPENDENT_AMBULATORY_CARE_PROVIDER_SITE_OTHER): Payer: Medicare Other | Admitting: Internal Medicine

## 2013-08-18 ENCOUNTER — Ambulatory Visit: Payer: Medicare Other | Admitting: Physical Therapy

## 2013-08-18 VITALS — BP 116/72 | HR 84 | Temp 97.2°F | Resp 16 | Ht 64.5 in | Wt 148.4 lb

## 2013-08-18 DIAGNOSIS — R7309 Other abnormal glucose: Secondary | ICD-10-CM

## 2013-08-18 DIAGNOSIS — Z5181 Encounter for therapeutic drug level monitoring: Secondary | ICD-10-CM

## 2013-08-18 DIAGNOSIS — E559 Vitamin D deficiency, unspecified: Secondary | ICD-10-CM

## 2013-08-18 DIAGNOSIS — I10 Essential (primary) hypertension: Secondary | ICD-10-CM

## 2013-08-18 DIAGNOSIS — R7303 Prediabetes: Secondary | ICD-10-CM | POA: Insufficient documentation

## 2013-08-18 DIAGNOSIS — E785 Hyperlipidemia, unspecified: Secondary | ICD-10-CM

## 2013-08-18 LAB — CBC WITH DIFFERENTIAL/PLATELET
Basophils Absolute: 0 10*3/uL (ref 0.0–0.1)
Basophils Relative: 0 % (ref 0–1)
Eosinophils Absolute: 0.1 10*3/uL (ref 0.0–0.7)
Eosinophils Relative: 1 % (ref 0–5)
HCT: 42.1 % (ref 36.0–46.0)
HEMOGLOBIN: 14.4 g/dL (ref 12.0–15.0)
LYMPHS PCT: 24 % (ref 12–46)
Lymphs Abs: 1.8 10*3/uL (ref 0.7–4.0)
MCH: 30.7 pg (ref 26.0–34.0)
MCHC: 34.2 g/dL (ref 30.0–36.0)
MCV: 89.8 fL (ref 78.0–100.0)
MONOS PCT: 8 % (ref 3–12)
Monocytes Absolute: 0.6 10*3/uL (ref 0.1–1.0)
NEUTROS ABS: 5 10*3/uL (ref 1.7–7.7)
Neutrophils Relative %: 67 % (ref 43–77)
PLATELETS: 120 10*3/uL — AB (ref 150–400)
RBC: 4.69 MIL/uL (ref 3.87–5.11)
RDW: 13.6 % (ref 11.5–15.5)
WBC: 7.4 10*3/uL (ref 4.0–10.5)

## 2013-08-18 NOTE — Progress Notes (Signed)
Patient ID: Pamela Whitaker, female   DOB: [birth year], 80 y.o.   MRN: [id number]    This very nice 80 y.o. recently widowed WF presents for 3 month follow up with Hypertension, ASHD/chAfib,  Hyperlipidemia, Pre-Diabetes and Vitamin D Deficiency.    HTN predates since 2011. BP has been controlled at home. Today's BP: 116/72 mmHg. In 2010 she was found to have A fib and currently is followed by Dr Johnsie Cancel for this. Patient denies any cardiac type chest pain, palpitations, dyspnea/orthopnea/PND, dizziness, claudication, or dependent edema.   Hyperlipidemia is not controlled with diet & meds. Last Cholesterol was 227, Triglycerides were 278, HDL 39 and LDL 132 in Dec 2014- not at goal. Patient denies myalgias or other med SE's.    Also, the patient has history of PreDiabetes with last A1c of 5.8% in Dec 2014. Patient denies any symptoms of reactive hypoglycemia, diabetic polys, paresthesias or visual blurring.   Further, Patient has history of Vitamin D Deficiency with last vitamin D of 43 in Dec 2014 and she supplements very sporatically.. Patient supplements vitamin D without any suspected side-effects.  Medication Sig  . carvedilol (COREG) 6.25 MG tablet Take 1 tablet (6.25 mg total) by mouth 2 (two) times daily with a meal.  . Cholecalciferol (VITAMIN D) 2000 UNITS tablet Take 2,000 Units by mouth 2 (two) times daily. Does not take regularly  . clonazePAM (KLONOPIN) 1 MG tablet (off) Take 1 mg by mouth at bedtime as needed.   . Multiple Vitamin (MULTIVITAMIN) capsule Take 1 capsule by mouth daily. Women's Centrum  . warfarin (COUMADIN) 2.5 MG tablet TAKE 1 TABLET (2.5 MG TOTAL) BY MOUTH AS DIRECTED.   Allergies  Allergen Reactions  . Amiodarone     Stopped on her own  . Lipitor [Atorvastatin]     weakness  . Multaq [Dronedarone]     Stopped on her own   PMHx:   Past Medical History  Diagnosis Date  . Atrial fibrillation     treated with multaq x 6 mos in 2011....[name]=2 (Age; + CHF;  no CVA; no DM2). Echo 05/24/10: EF 30% mild AS(mean 42mmgHg); mild LAE; mod LAE; PASP 35 Systolic CHF  . Cardiomyopathy     a. tachy mediated Myoview 06/06/10:  EF 31%; no scar or ischemia; EF up to 50% per echo November 2013  . Breast cancer      in 1987 with a left  mastectomy  . Osteoporosis     history of bone chips in the right knee,previous right fibular fracture, operated on by Dr. Veverly Fells  . RLS (restless legs syndrome)   . Lumbar stenosis   . Hypercholesteremia   . DJD (degenerative joint disease) of knee     right knee  . Falls   . Hypertension   . Vitamin D deficiency    FHx:    Reviewed / unchanged  SHx:    Reviewed / unchanged   Systems Review: Constitutional: Denies fever, chills, wt changes, headaches, insomnia, fatigue, night sweats, change in appetite. Eyes: Denies redness, blurred vision, diplopia, discharge, itchy, watery eyes.  ENT: Denies discharge, congestion, post nasal drip, epistaxis, sore throat, earache, hearing loss, dental pain, tinnitus, vertigo, sinus pain, snoring.  CV: Denies chest pain, palpitations, irregular heartbeat, syncope, dyspnea, diaphoresis, orthopnea, PND, claudication, edema. Respiratory: denies cough, dyspnea, DOE, pleurisy, hoarseness, laryngitis, wheezing.  Gastrointestinal: Denies dysphagia, odynophagia, heartburn, reflux, water brash, abdominal pain or cramps, nausea, vomiting, bloating, diarrhea, constipation, hematemesis, melena, hematochezia,  or hemorrhoids. Genitourinary: Denies dysuria,  frequency, urgency, nocturia, hesitancy, discharge, hematuria, flank pain. Musculoskeletal: Denies arthralgias, myalgias, stiffness, jt. swelling, pain, limp, strain/sprain.  Skin: Denies pruritus, rash, hives, warts, acne, eczema, change in skin lesion(s). Neuro: No weakness, tremor, incoordination, spasms, paresthesia, or pain. Psychiatric: Denies confusion, memory loss, or sensory loss. Endo: Denies change in weight, skin, hair change.   Heme/Lymph: No excessive bleeding, bruising, or enlarged lymph nodes.   Exam:  BP 116/72  Pulse 84  Temp97.2 F   Resp 16  Ht 5' 4.5"   Wt 148 lb 6.4 oz   BMI 25.09 kg/m2  Appears well nourished - in no distress. Eyes: PERRLA, EOMs, conjunctiva no swelling or erythema. Sinuses: No frontal/maxillary tenderness ENT/Mouth: EAC's clear, TM's nl w/o erythema, bulging. Nares clear w/o erythema, swelling, exudates. Oropharynx clear without erythema or exudates. Oral hygiene is good. Tongue normal, non obstructing. Hearing intact.  Neck: Supple. Thyroid nl. Car 2+/2+ without bruits, nodes or JVD. Chest: Respirations nl with BS clear & equal w/o rales, rhonchi, wheezing or stridor.  Cor: Heart sounds normal w/ regular rate and rhythm without sig. murmurs, gallops, clicks, or rubs. Peripheral pulses normal and equal  without edema.  Abdomen: Soft & bowel sounds normal. Non-tender w/o guarding, rebound, hernias, masses, or organomegaly.  Lymphatics: Unremarkable.  Musculoskeletal: Full ROM all peripheral extremities, joint stability, 5/5 strength, and normal gait.  Skin: Warm, dry without exposed rashes, lesions, ecchymosis apparent.  Neuro: Cranial nerves intact, reflexes equal bilaterally. Sensory-motor testing grossly intact. Tendon reflexes grossly intact.  Pysch: Alert & oriented x 3. Insight and judgement nl & appropriate. No ideations.  Assessment and Plan:  1. Hypertension - Continue monitor blood pressure at home. Continue diet/meds same.  2. Hyperlipidemia - Continue diet, exercise, & lifestyle modifications. Continue monitor periodic cholesterol/liver & renal functions   3. PreDiabetes - Continue diet, exercise, lifestyle modifications. Monitor appropriate labs.  4. Vitamin D Deficiency - Continue supplementation.  5. ASHD/chAfib  Recommended regular exercise, BP monitoring, weight control, and discussed med and SE's. Recommended labs to assess and monitor clinical status.  Further disposition pending results of labs.Will likely need chol meds pending labs.

## 2013-08-18 NOTE — Patient Instructions (Signed)

## 2013-08-19 LAB — INSULIN, FASTING: Insulin fasting, serum: 10 u[IU]/mL (ref 3–28)

## 2013-08-19 LAB — HEPATIC FUNCTION PANEL
ALT: 13 U/L (ref 0–35)
AST: 16 U/L (ref 0–37)
Albumin: 4 g/dL (ref 3.5–5.2)
Alkaline Phosphatase: 63 U/L (ref 39–117)
BILIRUBIN DIRECT: 0.1 mg/dL (ref 0.0–0.3)
BILIRUBIN INDIRECT: 0.5 mg/dL (ref 0.2–1.2)
Total Bilirubin: 0.6 mg/dL (ref 0.2–1.2)
Total Protein: 6.8 g/dL (ref 6.0–8.3)

## 2013-08-19 LAB — BASIC METABOLIC PANEL WITH GFR
BUN: 10 mg/dL (ref 6–23)
CALCIUM: 9.4 mg/dL (ref 8.4–10.5)
CHLORIDE: 104 meq/L (ref 96–112)
CO2: 27 meq/L (ref 19–32)
Creat: 0.64 mg/dL (ref 0.50–1.10)
GFR, Est African American: >89 mL/min
GFR, Est Non African American: 86 mL/min
GLUCOSE: 92 mg/dL (ref 70–99)
POTASSIUM: 4.4 meq/L (ref 3.5–5.3)
Sodium: 138 mEq/L (ref 135–145)

## 2013-08-19 LAB — LIPID PANEL
CHOL/HDL RATIO: 5.6 ratio
Cholesterol: 250 mg/dL — ABNORMAL HIGH (ref 0–200)
HDL: 45 mg/dL (ref >39)
LDL Cholesterol: 157 mg/dL — ABNORMAL HIGH (ref 0–99)
Triglycerides: 239 mg/dL — ABNORMAL HIGH (ref <150)
VLDL: 48 mg/dL — ABNORMAL HIGH (ref 0–40)

## 2013-08-19 LAB — VITAMIN D 25 HYDROXY (VIT D DEFICIENCY, FRACTURES): Vit D, 25-Hydroxy: 36 ng/mL (ref 30–89)

## 2013-08-19 LAB — MAGNESIUM: Magnesium: 1.9 mg/dL (ref 1.5–2.5)

## 2013-08-19 LAB — TSH: TSH: 2.159 u[IU]/mL (ref 0.350–4.500)

## 2013-08-19 LAB — HEMOGLOBIN A1C
Hgb A1c MFr Bld: 5.6 % (ref <5.7)
MEAN PLASMA GLUCOSE: 114 mg/dL (ref <117)

## 2013-08-21 ENCOUNTER — Other Ambulatory Visit: Payer: Self-pay | Admitting: Internal Medicine

## 2013-08-21 MED ORDER — ATORVASTATIN CALCIUM 80 MG PO TABS: ORAL_TABLET | ORAL | Status: DC

## 2013-08-22 ENCOUNTER — Other Ambulatory Visit: Payer: Self-pay | Admitting: *Deleted

## 2013-08-22 ENCOUNTER — Ambulatory Visit: Payer: Medicare Other | Admitting: Physical Therapy

## 2013-08-22 MED ORDER — FLUTICASONE PROPIONATE 50 MCG/ACT NA SUSP: 1.0000 | Freq: Every day | NASAL | Status: DC

## 2013-08-23 ENCOUNTER — Ambulatory Visit (INDEPENDENT_AMBULATORY_CARE_PROVIDER_SITE_OTHER): Payer: Medicare Other | Admitting: Pharmacist

## 2013-08-23 DIAGNOSIS — I4891 Unspecified atrial fibrillation: Secondary | ICD-10-CM

## 2013-08-23 DIAGNOSIS — Z5181 Encounter for therapeutic drug level monitoring: Secondary | ICD-10-CM

## 2013-08-23 LAB — POCT INR: INR: 1.9

## 2013-08-25 ENCOUNTER — Telehealth: Payer: Self-pay

## 2013-08-25 ENCOUNTER — Ambulatory Visit: Payer: Medicare Other | Attending: Diagnostic Neuroimaging | Admitting: Physical Therapy

## 2013-08-25 DIAGNOSIS — IMO0001 Reserved for inherently not codable concepts without codable children: Secondary | ICD-10-CM | POA: Insufficient documentation

## 2013-08-25 DIAGNOSIS — R269 Unspecified abnormalities of gait and mobility: Secondary | ICD-10-CM | POA: Insufficient documentation

## 2013-08-25 NOTE — Telephone Encounter (Signed)
Pt called requesting lab results. Pt was signed up for mychart and results were released to my chart. Pt aware of lab results. Pt was advised to start taking Atorvastatin 80 mg 1/2 tab Tues, Thurs, Sat. Pt refuses Rx at this time.

## 2013-08-29 ENCOUNTER — Ambulatory Visit: Payer: Medicare Other | Admitting: Physical Therapy

## 2013-08-31 ENCOUNTER — Ambulatory Visit: Payer: Medicare Other | Admitting: Physical Therapy

## 2013-09-06 ENCOUNTER — Ambulatory Visit: Payer: Medicare Other | Admitting: Physical Therapy

## 2013-09-08 ENCOUNTER — Ambulatory Visit: Payer: Medicare Other | Admitting: Physical Therapy

## 2013-09-20 ENCOUNTER — Ambulatory Visit (INDEPENDENT_AMBULATORY_CARE_PROVIDER_SITE_OTHER): Payer: Medicare Other | Admitting: *Deleted

## 2013-09-20 DIAGNOSIS — Z5181 Encounter for therapeutic drug level monitoring: Secondary | ICD-10-CM

## 2013-09-20 DIAGNOSIS — I4891 Unspecified atrial fibrillation: Secondary | ICD-10-CM

## 2013-09-20 LAB — POCT INR: INR: 2

## 2013-10-18 ENCOUNTER — Ambulatory Visit (INDEPENDENT_AMBULATORY_CARE_PROVIDER_SITE_OTHER): Payer: Medicare Other | Admitting: *Deleted

## 2013-10-18 DIAGNOSIS — Z5181 Encounter for therapeutic drug level monitoring: Secondary | ICD-10-CM

## 2013-10-18 DIAGNOSIS — I4891 Unspecified atrial fibrillation: Secondary | ICD-10-CM

## 2013-10-18 LAB — POCT INR: INR: 2

## 2013-11-02 ENCOUNTER — Encounter: Payer: Self-pay | Admitting: Podiatry

## 2013-11-02 ENCOUNTER — Ambulatory Visit (INDEPENDENT_AMBULATORY_CARE_PROVIDER_SITE_OTHER): Payer: Medicare Other | Admitting: Podiatry

## 2013-11-02 DIAGNOSIS — M948X9 Other specified disorders of cartilage, unspecified sites: Secondary | ICD-10-CM

## 2013-11-02 DIAGNOSIS — IMO0002 Reserved for concepts with insufficient information to code with codable children: Secondary | ICD-10-CM

## 2013-11-02 DIAGNOSIS — M2580 Other specified joint disorders, unspecified joint: Secondary | ICD-10-CM

## 2013-11-02 DIAGNOSIS — M792 Neuralgia and neuritis, unspecified: Secondary | ICD-10-CM

## 2013-11-02 NOTE — Progress Notes (Signed)
She presents today states the injection helped for several months. She like to have another injection hopefully to get her through the summer. She presents today walking with the cane and pain to her right foot.  Objective: Vital signs are stable she is alert and oriented x3. Her to the right foot with pain on palpation to the tibial sesamoid of the right foot and to the area just medial to that I suspect this is more than likely a [place] sterile as well.  Assessment: Sesamoiditis possible Dopplers neuroma with skin fissure right foot.  Plan: Injected the area today with dexamethasone and local anesthetic to the point of maximal tenderness. I will followup with her on an as-needed basis.

## 2013-11-15 ENCOUNTER — Ambulatory Visit (INDEPENDENT_AMBULATORY_CARE_PROVIDER_SITE_OTHER): Payer: Medicare Other | Admitting: *Deleted

## 2013-11-15 ENCOUNTER — Other Ambulatory Visit: Payer: Self-pay | Admitting: *Deleted

## 2013-11-15 DIAGNOSIS — I4891 Unspecified atrial fibrillation: Secondary | ICD-10-CM

## 2013-11-15 DIAGNOSIS — Z5181 Encounter for therapeutic drug level monitoring: Secondary | ICD-10-CM

## 2013-11-15 LAB — POCT INR: INR: 3

## 2013-11-15 MED ORDER — WARFARIN SODIUM 2.5 MG PO TABS: 2.5000 mg | ORAL_TABLET | ORAL | Status: DC

## 2013-11-18 ENCOUNTER — Ambulatory Visit (INDEPENDENT_AMBULATORY_CARE_PROVIDER_SITE_OTHER): Payer: 59 | Admitting: Emergency Medicine

## 2013-11-18 ENCOUNTER — Encounter: Payer: Self-pay | Admitting: Emergency Medicine

## 2013-11-18 VITALS — BP 116/70 | HR 74 | Temp 97.8°F | Resp 16 | Ht 64.5 in | Wt 150.0 lb

## 2013-11-18 DIAGNOSIS — I10 Essential (primary) hypertension: Secondary | ICD-10-CM

## 2013-11-18 DIAGNOSIS — E782 Mixed hyperlipidemia: Secondary | ICD-10-CM

## 2013-11-18 DIAGNOSIS — R7309 Other abnormal glucose: Secondary | ICD-10-CM

## 2013-11-18 LAB — BASIC METABOLIC PANEL WITH GFR
BUN: 15 mg/dL (ref 6–23)
CALCIUM: 9.5 mg/dL (ref 8.4–10.5)
CO2: 28 meq/L (ref 19–32)
Chloride: 109 mEq/L (ref 96–112)
Creat: 0.69 mg/dL (ref 0.50–1.10)
GFR, Est African American: >89 mL/min
GFR, Est Non African American: 83 mL/min
GLUCOSE: 95 mg/dL (ref 70–99)
Potassium: 5.2 mEq/L (ref 3.5–5.3)
SODIUM: 138 meq/L (ref 135–145)

## 2013-11-18 LAB — CBC WITH DIFFERENTIAL/PLATELET
Basophils Absolute: 0 10*3/uL (ref 0.0–0.1)
Basophils Relative: 0 % (ref 0–1)
EOS ABS: 0.2 10*3/uL (ref 0.0–0.7)
Eosinophils Relative: 2 % (ref 0–5)
HCT: 40.1 % (ref 36.0–46.0)
HEMOGLOBIN: 13.8 g/dL (ref 12.0–15.0)
LYMPHS ABS: 1.7 10*3/uL (ref 0.7–4.0)
LYMPHS PCT: 23 % (ref 12–46)
MCH: 30.3 pg (ref 26.0–34.0)
MCHC: 34.4 g/dL (ref 30.0–36.0)
MCV: 88.1 fL (ref 78.0–100.0)
Monocytes Absolute: 0.6 10*3/uL (ref 0.1–1.0)
Monocytes Relative: 8 % (ref 3–12)
NEUTROS ABS: 5.1 10*3/uL (ref 1.7–7.7)
NEUTROS PCT: 67 % (ref 43–77)
Platelets: 126 10*3/uL — ABNORMAL LOW (ref 150–400)
RBC: 4.55 MIL/uL (ref 3.87–5.11)
RDW: 13.3 % (ref 11.5–15.5)
WBC: 7.6 10*3/uL (ref 4.0–10.5)

## 2013-11-18 LAB — LIPID PANEL
Cholesterol: 256 mg/dL — ABNORMAL HIGH (ref 0–200)
HDL: 45 mg/dL (ref >39)
LDL Cholesterol: 154 mg/dL — ABNORMAL HIGH (ref 0–99)
TRIGLYCERIDES: 283 mg/dL — AB (ref <150)
Total CHOL/HDL Ratio: 5.7 Ratio
VLDL: 57 mg/dL — ABNORMAL HIGH (ref 0–40)

## 2013-11-18 LAB — HEPATIC FUNCTION PANEL
ALK PHOS: 66 U/L (ref 39–117)
ALT: 14 U/L (ref 0–35)
AST: 15 U/L (ref 0–37)
Albumin: 4.2 g/dL (ref 3.5–5.2)
Bilirubin, Direct: 0.1 mg/dL (ref 0.0–0.3)
Indirect Bilirubin: 0.5 mg/dL (ref 0.2–1.2)
TOTAL PROTEIN: 6.9 g/dL (ref 6.0–8.3)
Total Bilirubin: 0.6 mg/dL (ref 0.2–1.2)

## 2013-11-18 LAB — HEMOGLOBIN A1C
HEMOGLOBIN A1C: 5.7 % — AB (ref <5.7)
Mean Plasma Glucose: 117 mg/dL — ABNORMAL HIGH (ref <117)

## 2013-11-18 NOTE — Progress Notes (Signed)
Subjective:    Patient ID: Pamela Whitaker, female    DOB: May 02, 1933, 80 y.o.   MRN: [id number]  HPI Comments: 80 yo WF presents for 3 month F/U for HTN, Cholesterol, Pre-Dm, D. Deficient. She is trying to eat healthier. She is doing water exercise 3 x week. She is agitated that she had to wait so long at her appointment and admits she is a little depresses. She notes she does not have much time to do anything because she has so much to take care of now that her husband has passed away.  WBC             7.4   08/18/2013 HGB            14.4   08/18/2013 HCT            42.1   08/18/2013 PLT             120   08/18/2013 GLUCOSE          92   08/18/2013 CHOL            250   08/18/2013 TRIG            239   08/18/2013 HDL              45   08/18/2013 LDLCALC         157   08/18/2013 ALT              13   08/18/2013 AST              16   08/18/2013 NA              138   08/18/2013 K               4.4   08/18/2013 CL              104   08/18/2013 CREATININE     0.64   08/18/2013 BUN              10   08/18/2013 CO2              27   08/18/2013 TSH           2.159   08/18/2013 INR             3.0   11/15/2013 HGBA1C          5.6   08/18/2013 MICROALBUR     0.50   04/26/2013   Hypertension     Medication List       This list is accurate as of: 11/18/13  9:58 AM.  Always use your most recent med list.               carvedilol 6.25 MG tablet  Commonly known as:  COREG  Take 1 tablet (6.25 mg total) by mouth 2 (two) times daily with a meal.     fluticasone 50 MCG/ACT nasal spray  Commonly known as:  FLONASE  Place 1 spray into both nostrils daily.     multivitamin capsule  Take 1 capsule by mouth daily. Women's Centrum     Vitamin D 2000 UNITS tablet  Take 2,000 Units by mouth 2 (two) times daily. Does not take regularly     warfarin 2.5 MG tablet  Commonly known as:  COUMADIN  Take 2.5 mg by mouth daily.       Allergies  Allergen Reactions  .  Amiodarone     Stopped on her own  .  Lipitor [Atorvastatin]     weakness  . Multaq [Dronedarone]     Stopped on her own   Past Medical History  Diagnosis Date  . Atrial fibrillation     treated with multaq x 6 mos in 2011....[name]=2 (Age; + CHF; no CVA; no DM2). Echo 05/24/10: EF 30% mild AS(mean 82mmgHg); mild LAE; mod LAE; PASP 35 Systolic CHF  . Cardiomyopathy     a. tachy mediated Myoview 06/06/10:  EF 31%; no scar or ischemia; EF up to 50% per echo November 2013  . Breast cancer      in 1987 with a left  mastectomy  . Osteoporosis     history of bone chips in the right knee,previous right fibular fracture, operated on by Dr. Veverly Fells  . RLS (restless legs syndrome)   . Lumbar stenosis   . Hypercholesteremia   . DJD (degenerative joint disease) of knee     right knee  . Falls   . Hypertension   . Vitamin D deficiency       Review of Systems  Psychiatric/Behavioral: Positive for agitation.  All other systems reviewed and are negative.  BP 116/70  Pulse 74  Temp(Src) 97.8 F (36.6 C) (Temporal)  Resp 16  Ht 5' 4.5" (1.638 m)  Wt 150 lb (68.04 kg)  BMI 25.36 kg/m2     Objective:   Physical Exam  Nursing note and vitals reviewed. Constitutional: She is oriented to person, place, and time. She appears well-developed and well-nourished. No distress.  HENT:  Head: Normocephalic and atraumatic.  Right Ear: External ear normal.  Left Ear: External ear normal.  Nose: Nose normal.  Mouth/Throat: Oropharynx is clear and moist.  Eyes: Conjunctivae and EOM are normal.  Neck: Normal range of motion. Neck supple. No JVD present. No thyromegaly present.  Cardiovascular: Normal rate, regular rhythm, normal heart sounds and intact distal pulses.   Pulmonary/Chest: Effort normal and breath sounds normal.  Abdominal: Soft. Bowel sounds are normal. She exhibits no distension. There is no tenderness.  Musculoskeletal: Normal range of motion. She exhibits no edema and no tenderness.  Lymphadenopathy:    She has no  cervical adenopathy.  Neurological: She is alert and oriented to person, place, and time. No cranial nerve deficit.  Skin: Skin is warm and dry. No rash noted. No erythema. No pallor.  Psychiatric: Her behavior is normal. Judgment and thought content normal.  Mildly agitated and does not "have time to discuss it with me"          Assessment & Plan:  1.  3 month F/U for HTN, Cholesterol, Pre-Dm, D. Deficient. Needs healthy diet, cardio QD and obtain healthy weight. Check Labs, Check BP if >130/80 call office   2. ? Depressed/ aggitated- Advised counseling/ increase activity

## 2013-11-19 LAB — INSULIN, FASTING: Insulin fasting, serum: 16 u[IU]/mL (ref 3–28)

## 2013-11-29 ENCOUNTER — Telehealth: Payer: Self-pay | Admitting: *Deleted

## 2013-11-29 NOTE — Telephone Encounter (Signed)
Spoke with pt who asked if she could take Aleve with her coumadin and instructed that she should not take Aleve because it is ASA based and may increase chance of having GI bleed . Instructed to take Tylenol for pain and she states understanding

## 2013-12-27 ENCOUNTER — Ambulatory Visit (INDEPENDENT_AMBULATORY_CARE_PROVIDER_SITE_OTHER): Payer: Medicare Other | Admitting: *Deleted

## 2013-12-27 DIAGNOSIS — I4891 Unspecified atrial fibrillation: Secondary | ICD-10-CM

## 2013-12-27 DIAGNOSIS — Z5181 Encounter for therapeutic drug level monitoring: Secondary | ICD-10-CM

## 2013-12-27 LAB — POCT INR: INR: 2.2

## 2014-02-07 ENCOUNTER — Ambulatory Visit (INDEPENDENT_AMBULATORY_CARE_PROVIDER_SITE_OTHER): Payer: Medicare Other | Admitting: *Deleted

## 2014-02-07 DIAGNOSIS — I4891 Unspecified atrial fibrillation: Secondary | ICD-10-CM

## 2014-02-07 DIAGNOSIS — Z5181 Encounter for therapeutic drug level monitoring: Secondary | ICD-10-CM

## 2014-02-07 LAB — POCT INR: INR: 1.8

## 2014-02-07 MED ORDER — WARFARIN SODIUM 2.5 MG PO TABS: ORAL_TABLET | ORAL | Status: DC

## 2014-02-13 ENCOUNTER — Ambulatory Visit (INDEPENDENT_AMBULATORY_CARE_PROVIDER_SITE_OTHER): Payer: Medicare Other | Admitting: Podiatry

## 2014-02-13 VITALS — BP 110/78 | HR 84 | Resp 16

## 2014-02-13 DIAGNOSIS — M6789 Other specified disorders of synovium and tendon, multiple sites: Secondary | ICD-10-CM

## 2014-02-13 DIAGNOSIS — M6520 Calcific tendinitis, unspecified site: Secondary | ICD-10-CM

## 2014-02-13 DIAGNOSIS — M76829 Posterior tibial tendinitis, unspecified leg: Secondary | ICD-10-CM

## 2014-02-13 DIAGNOSIS — M65271 Calcific tendinitis, right ankle and foot: Secondary | ICD-10-CM

## 2014-02-13 NOTE — Progress Notes (Signed)
She presents today states that her right foot is rolling in when she walks is becoming more painful as time goes on. She states that she wears inserts in her shoes to keep her foot rolling down but is still does so.  Objective: Vital signs are stable she is alert oriented x3. Pulses are palpable. When she stands she has severe pronation of the right foot with limited inversion against resistance. This is indicative of posterior tibial tendinitis. With posterior tibial tendon dysfunction.  Assessment: Pain in limb secondary to posterior tibial tendon dysfunction with tendinitis right foot.  Plan: Injected the area today with dexamethasone and local anesthetic and placed her in a tri-lock brace. We discussed the possible need for an [place] brace.

## 2014-02-20 ENCOUNTER — Ambulatory Visit (INDEPENDENT_AMBULATORY_CARE_PROVIDER_SITE_OTHER): Payer: 59 | Admitting: Physician Assistant

## 2014-02-20 ENCOUNTER — Ambulatory Visit: Payer: Self-pay | Admitting: Physician Assistant

## 2014-02-20 ENCOUNTER — Ambulatory Visit: Payer: Self-pay | Admitting: Internal Medicine

## 2014-02-20 VITALS — BP 118/64 | HR 80 | Temp 97.7°F | Resp 16 | Ht 64.5 in | Wt 149.0 lb

## 2014-02-20 DIAGNOSIS — M810 Age-related osteoporosis without current pathological fracture: Secondary | ICD-10-CM

## 2014-02-20 DIAGNOSIS — Z79899 Other long term (current) drug therapy: Secondary | ICD-10-CM

## 2014-02-20 DIAGNOSIS — G2581 Restless legs syndrome: Secondary | ICD-10-CM

## 2014-02-20 DIAGNOSIS — Z0000 Encounter for general adult medical examination without abnormal findings: Secondary | ICD-10-CM

## 2014-02-20 DIAGNOSIS — R7309 Other abnormal glucose: Secondary | ICD-10-CM

## 2014-02-20 DIAGNOSIS — I10 Essential (primary) hypertension: Secondary | ICD-10-CM

## 2014-02-20 DIAGNOSIS — Z9181 History of falling: Secondary | ICD-10-CM

## 2014-02-20 DIAGNOSIS — Z1331 Encounter for screening for depression: Secondary | ICD-10-CM

## 2014-02-20 DIAGNOSIS — M1990 Unspecified osteoarthritis, unspecified site: Secondary | ICD-10-CM

## 2014-02-20 DIAGNOSIS — H6120 Impacted cerumen, unspecified ear: Secondary | ICD-10-CM

## 2014-02-20 DIAGNOSIS — I4891 Unspecified atrial fibrillation: Secondary | ICD-10-CM

## 2014-02-20 DIAGNOSIS — E785 Hyperlipidemia, unspecified: Secondary | ICD-10-CM

## 2014-02-20 DIAGNOSIS — I5020 Unspecified systolic (congestive) heart failure: Secondary | ICD-10-CM

## 2014-02-20 DIAGNOSIS — H6122 Impacted cerumen, left ear: Secondary | ICD-10-CM

## 2014-02-20 DIAGNOSIS — I482 Chronic atrial fibrillation, unspecified: Secondary | ICD-10-CM

## 2014-02-20 DIAGNOSIS — I428 Other cardiomyopathies: Secondary | ICD-10-CM

## 2014-02-20 DIAGNOSIS — E559 Vitamin D deficiency, unspecified: Secondary | ICD-10-CM

## 2014-02-20 DIAGNOSIS — Z23 Encounter for immunization: Secondary | ICD-10-CM

## 2014-02-20 DIAGNOSIS — M48062 Spinal stenosis, lumbar region with neurogenic claudication: Secondary | ICD-10-CM

## 2014-02-20 NOTE — Progress Notes (Signed)
MEDICARE ANNUAL WELLNESS VISIT AND FOLLOW UP  Assessment:   1. Screening for depression Negative screen  2. Routine general medical examination at a health care facility  3. At high risk for falls Declines PT at this time, continue follow up ortho and continue water therapy  4. Other abnormal glucose Discussed general issues about diabetes pathophysiology and management., Educational material distributed., Suggested low cholesterol diet., Encouraged aerobic exercise., Discussed foot care., Reminded to get yearly retinal exam. - Hemoglobin A1c - HM DIABETES FOOT EXAM  5. Chronic atrial fibrillation Continue follow up with coumadin clinc  6. CARDIOMYOPATHY Control HTN, chol, sugars, follow up cardio  7. Essential hypertension - CBC with Differential - BASIC METABOLIC PANEL WITH GFR - Hepatic function panel - TSH  8. Unspecified systolic heart failure Continue follow up with Dr. Johnsie Cancel, EF improved last echo, monitor weight  9. OSTEOARTHRITIS Continue follow up  10. HYPERLIPIDEMIA - Lipid panel  11. RESTLESS LEGS SYNDROME controlled  12. Spinal stenosis, lumbar region, with neurogenic claudication Discussed PT, she is doing water therapy which she states is helping, declines PT  13. Vitamin D deficiency - Vit D  25 hydroxy (rtn osteoporosis monitoring)  14. Encounter for long-term (current) use of other medications - Magnesium  15. Osteoporosis, unspecified - DG Bone Density; Future  16. Need for prophylactic vaccination and inoculation against influenza Will get PREVNAR next visit - Flu vaccine HIGH DOSE PF  17. Cerumen impaction, left Cerumen removal   Plan:   During the course of the visit the patient was educated and counseled about appropriate screening and preventive services including:    Pneumococcal vaccine   Influenza vaccine  Td vaccine  Screening electrocardiogram  Screening mammography  Bone densitometry screening  Colorectal  cancer screening  Diabetes screening  Glaucoma screening  Nutrition counseling   Advanced directives: given info/requested  Screening recommendations, referrals:  Vaccinations: Please see documentation below and orders this visit.   Nutrition assessed and recommended  Colonoscopy due next year Mammogram Due 06/2014 Pap smear not indicated Pelvic exam not indicated Recommended yearly ophthalmology/optometry visit for glaucoma screening and checkup Recommended yearly dental visit for hygiene and checkup Advanced directives - requested  Conditions/risks identified: BMI: Discussed weight loss, diet, and increase physical activity.  Increase physical activity: AHA recommends 150 minutes of physical activity a week.  Medications reviewed DEXA- ordered Diabetes is at goal, ACE/ARB therapy: No, Reason not on Ace Inhibitor/ARB therapy:  prediabetes Urinary Incontinence is an issue: discussed non pharmacology and pharmacology options.  Fall risk: high- discussed PT, home fall assessment, medications.    Subjective:   Pamela Whitaker is a 80 y.o. female who presents for Medicare Annual Wellness Visit and 3 month follow up on hypertension, prediabetes, hyperlipidemia, vitamin D def.  Date of last medicare wellness visit is unknown.   Her blood pressure has been controlled at home, today their BP is BP: 118/64 mmHg She does workout and does water aerobics at the [facility], walks with cane. She denies chest pain, shortness of breath, dizziness.  She is not on cholesterol medication and denies myalgias. Her cholesterol is not at goal. The cholesterol last visit was:   Lab Results  Component Value Date   CHOL 256* 11/18/2013   HDL 45 11/18/2013   LDLCALC 154* 11/18/2013   TRIG 283* 11/18/2013   CHOLHDL 5.7 11/18/2013   She has been working on diet and exercise for prediabetes, and denies polydipsia and polyuria. Last A1C in the office was:  Lab Results  Component Value Date   HGBA1C 5.7*  11/18/2013   Patient is on Vitamin D supplement. Lab Results  Component Value Date   VD25OH 36 08/18/2013     She has chronic atrial fibrillation, last EF was 50-55% in 2013, she follows with Dr. Johnsie Cancel at [facility]in clinic and is on couamdin and coreg, last cardioversion 2012. Normal lexiscan in 2012.  She has a history of left breast cancer in 1987 s/p mastectomy and has regular mammograms.  She is walking with a cane, she is seeing Dr. Maureen Ralphs for her knee and Dr. Milinda Pointer for her right ankle turning in and she wears a brace and recently had an injection. If she is walking long distances she will use a walker. She has had several falls in the past year without injury.   Names of Other Physician/Practitioners you currently use: 1. [facility]- here for primary care 2.  Dr. Oneida Arenas, dentist, last visit March 2015 Patient Care Team: Unk Pinto, MD as PCP - General (Internal Medicine) Josue Hector, MD as Consulting Physician (Cardiology) Hayden Pedro, MD as Consulting Physician (Ophthalmology)- March 2015 Winfield Cunas., MD as Consulting Physician (Gastroenterology) Newt Minion, MD as Consulting Physician (Orthopedic Surgery) Danella Sensing, MD as Consulting Physician (Dermatology)  Medication Review Current Outpatient Prescriptions on File Prior to Visit  Medication Sig Dispense Refill  . carvedilol (COREG) 6.25 MG tablet Take 1 tablet (6.25 mg total) by mouth 2 (two) times daily with a meal.  60 tablet  11  . Cholecalciferol (VITAMIN D) 2000 UNITS tablet Take 2,000 Units by mouth 2 (two) times daily. Does not take regularly      . fluticasone (FLONASE) 50 MCG/ACT nasal spray Place 1 spray into both nostrils daily.  16 g  6  . Multiple Vitamin (MULTIVITAMIN) capsule Take 1 capsule by mouth daily. Women's Centrum      . warfarin (COUMADIN) 2.5 MG tablet Take as directed by coumadin clinic  35 tablet  3   No current facility-administered  medications on file prior to visit.    Current Problems (verified) Patient Active Problem List   Diagnosis Date Noted  . Other abnormal glucose 08/18/2013  . Encounter for therapeutic drug monitoring 06/23/2013  . Hypertension   . Vitamin D deficiency   . Spinal stenosis, lumbar region, with neurogenic claudication 03/28/2013    Class: Chronic  . Gait difficulty 02/23/2013  . Unspecified systolic heart failure 34/19/3790  . CARDIOMYOPATHY 05/29/2010  . Atrial fibrillation 04/16/2009  . HYPERLIPIDEMIA 04/13/2009  . RESTLESS LEGS SYNDROME 04/13/2009  . OSTEOARTHRITIS 04/13/2009    Screening Tests Health Maintenance  Topic Date Due  . Colonoscopy  08/20/1984  . Tetanus/tdap  05/26/2010  . Influenza Vaccine  12/24/2013  . Pneumococcal Polysaccharide Vaccine Age 52 And Over  Completed  . Zostavax  Completed     Immunization History  Administered Date(s) Administered  . Influenza-Unspecified 04/28/2013  . PPD Test 04/26/2013  . Pneumococcal Polysaccharide-23 05/26/2006  . Td 05/26/2000  . Zoster 05/26/2008    Preventative care: Last colonoscopy: 2006 due 10 years Last mammogram: 06/2013 Last pap smear/pelvic exam: 2011  DEXA: 07/2011 stable osteoporosis DUE 07/2013 Aorta scan normal 2012 Echo 08/2011 EF 50-55%, mild AR/MR Lexiscan 05/2010 CXR 2012  Prior vaccinations: TD or Tdap: 2002  Influenza: 2014 DUE Pneumococcal: 2008 Prevnar DUE Shingles/Zostavax: 2010  History reviewed: allergies, current medications, past family history, past medical history, past social history, past surgical history and problem list  Risk Factors: Osteoporosis/FallRisk: postmenopausal estrogen deficiency and dietary calcium and/or vitamin D deficiency In the past year have you fallen or had a near fall?:Yes History of fracture in the past year: no  Tobacco History  Substance Use Topics  . Smoking status: Never Smoker   . Smokeless tobacco: Never Used  . Alcohol Use: No    She does not smoke.  Patient is not a former smoker. Are there smokers in your home (other than you)?  No  Alcohol Current alcohol use: none  Caffeine Current caffeine use: coffee 1 /day  Exercise Current exercise: no regular exercise and stays busy  Nutrition/Diet Current diet: in general, a "healthy" diet    Cardiac risk factors: advanced age (older than 26 for men, 34 for women), diabetes mellitus, dyslipidemia, hypertension, obesity (BMI >= 30 kg/m2) and sedentary lifestyle.  Depression Screen (Note: if answer to either of the following is "Yes", a more complete depression screening is indicated)   Q1: Over the past two weeks, have you felt down, depressed or hopeless? No  Q2: Over the past two weeks, have you felt little interest or pleasure in doing things? No  Have you lost interest or pleasure in daily life? No  Do you often feel hopeless? No  Do you cry easily over simple problems? No  Activities of Daily Living In your present state of health, do you have any difficulty performing the following activities?:  Driving? No Managing money?  No Feeding yourself? No Getting from bed to chair? No Climbing a flight of stairs? No Preparing food and eating?: No Bathing or showering? No Getting dressed: No Getting to the toilet? No Using the toilet:No Moving around from place to place: Yes   Are you sexually active?  No  Do you have more than one partner?  No  Vision Difficulties: No  Hearing Difficulties: Yes- better after cerumen impaction removal Do you often ask people to speak up or repeat themselves? Yes Do you experience ringing or noises in your ears? No Do you have difficulty understanding soft or whispered voices? Yes  Cognition  Do you feel that you have a problem with memory?No  Do you often misplace items? No  Do you feel safe at home?  Yes  Advanced directives Does patient have a [place]? Yes Does patient have a Living  Will? Yes   Objective:   Blood pressure 118/64, pulse 80, temperature 97.7 F (36.5 C), resp. rate 16, height 5' 4.5" (1.638 m), weight 149 lb (67.586 kg). Body mass index is 25.19 kg/(m^2).  General appearance: alert, no distress, WD/WN,  female Cognitive Testing  Alert? Yes  Normal Appearance?Yes  Oriented to person? Yes  Place? Yes   Time? Yes  Recall of three objects?  Yes  Can perform simple calculations? Yes  Displays appropriate judgment?Yes  Can read the correct time from a watch face?Yes  HEENT: normocephalic, sclerae anicteric, Left ear with cerumen impaction, removed, TMs pearly, nares patent, no discharge or erythema, pharynx normal Oral cavity: MMM, no lesions Neck: supple, no lymphadenopathy, no thyromegaly, no masses Heart: Irreg, irreg, normal S1, S2, 2/6 systolic murmur Lungs: CTA bilaterally, no wheezes, rhonchi, or rales Abdomen: +bs, soft, non tender, non distended, no masses, no hepatomegaly, no splenomegaly Musculoskeletal: nontender, no swelling, no obvious deformity Extremities: no edema, no cyanosis, no clubbing Pulses: 2+ symmetric, upper and lower extremities, normal cap refill Neurological: alert, oriented x 3, CN2-12 intact, strength normal upper extremities and lower extremities, sensation  normal throughout, DTRs 2+ throughout, no cerebellar signs, gait unsteady/atalgic with a cane Psychiatric: normal affect, behavior normal, pleasant  Breast: defer Gyn: defer Rectal: defer  Medicare Attestation I have personally reviewed: The patient's medical and social history Their use of alcohol, tobacco or illicit drugs Their current medications and supplements The patient's functional ability including ADLs,fall risks, home safety risks, cognitive, and hearing and visual impairment Diet and physical activities Evidence for depression or mood disorders  The patient's weight, height, BMI, and visual acuity have been recorded in the chart.  I have made  referrals, counseling, and provided education to the patient based on review of the above and I have provided the patient with a written personalized care plan for preventive services.     Vicie Mutters, PA-C   02/20/2014

## 2014-02-20 NOTE — Patient Instructions (Signed)
Use a dropper to put olive oil or canola oil in the effected ear- 2-3 times a week. Let it soak for 20-30 min then you can take a shower or use a baby bulb with warm water to wash out the ear wax.  Do not use Qtips  Preventative Care for Adults - Female      MAINTAIN REGULAR HEALTH EXAMS:  A routine yearly physical is a good way to check in with your primary care provider about your health and preventive screening. It is also an opportunity to share updates about your health and any concerns you have, and receive a thorough all-over exam.   Most health insurance companies pay for at least some preventative services.  Check with your health plan for specific coverages.  WHAT PREVENTATIVE SERVICES DO WOMEN NEED?  Adult women should have their weight and blood pressure checked regularly.   Women age 78 and older should have their cholesterol levels checked regularly.  Women should be screened for cervical cancer with a Pap smear and pelvic exam beginning at either age 78, or 3 years after they become sexually activity.    Breast cancer screening generally begins at age 78 with a mammogram and breast exam by your primary care provider.    Beginning at age 41 and continuing to age 70, women should be screened for colorectal cancer.  Certain people may need continued testing until age 35.  Updating vaccinations is part of preventative care.  Vaccinations help protect against diseases such as the flu.  Osteoporosis is a disease in which the bones lose minerals and strength as we age. Women ages 78 and over should discuss this with their caregivers, as should women after menopause who have other risk factors.  Lab tests are generally done as part of preventative care to screen for anemia and blood disorders, to screen for problems with the kidneys and liver, to screen for bladder problems, to check blood sugar, and to check your cholesterol level.  Preventative services generally include  counseling about diet, exercise, avoiding tobacco, drugs, excessive alcohol consumption, and sexually transmitted infections.    GENERAL RECOMMENDATIONS FOR GOOD HEALTH:  Healthy diet:  Eat a variety of foods, including fruit, vegetables, animal or vegetable protein, such as meat, fish, chicken, and eggs, or beans, lentils, tofu, and grains, such as rice.  Drink plenty of water daily.  Decrease saturated fat in the diet, avoid lots of red meat, processed foods, sweets, fast foods, and fried foods.  Exercise:  Aerobic exercise helps maintain good heart health. At least 30-40 minutes of moderate-intensity exercise is recommended. For example, a brisk walk that increases your heart rate and breathing. This should be done on most days of the week.   Find a type of exercise or a variety of exercises that you enjoy so that it becomes a part of your daily life.  Examples are running, walking, swimming, water aerobics, and biking.  For motivation and support, explore group exercise such as aerobic class, spin class, Zumba, Yoga,or  martial arts, etc.    Set exercise goals for yourself, such as a certain weight goal, walk or run in a race such as a 5k walk/run.  Speak to your primary care provider about exercise goals.  Disease prevention:  If you smoke or chew tobacco, find out from your caregiver how to quit. It can literally save your life, no matter how long you have been a tobacco user. If you do not use tobacco, never begin.  Maintain a healthy diet and normal weight. Increased weight leads to problems with blood pressure and diabetes.   The Body Mass Index or BMI is a way of measuring how much of your body is fat. Having a BMI above 27 increases the risk of heart disease, diabetes, hypertension, stroke and other problems related to obesity. Your caregiver can help determine your BMI and based on it develop an exercise and dietary program to help you achieve or maintain this important  measurement at a healthful level.  High blood pressure causes heart and blood vessel problems.  Persistent high blood pressure should be treated with medicine if weight loss and exercise do not work.   Fat and cholesterol leaves deposits in your arteries that can block them. This causes heart disease and vessel disease elsewhere in your body.  If your cholesterol is found to be high, or if you have heart disease or certain other medical conditions, then you may need to have your cholesterol monitored frequently and be treated with medication.   Ask if you should have a cardiac stress test if your history suggests this. A stress test is a test done on a treadmill that looks for heart disease. This test can find disease prior to there being a problem.  Menopause can be associated with physical symptoms and risks. Hormone replacement therapy is available to decrease these. You should talk to your caregiver about whether starting or continuing to take hormones is right for you.   Osteoporosis is a disease in which the bones lose minerals and strength as we age. This can result in serious bone fractures. Risk of osteoporosis can be identified using a bone density scan. Women ages 78 and over should discuss this with their caregivers, as should women after menopause who have other risk factors. Ask your caregiver whether you should be taking a calcium supplement and Vitamin D, to reduce the rate of osteoporosis.   Avoid drinking alcohol in excess (more than two drinks per day).  Avoid use of street drugs. Do not share needles with anyone. Ask for professional help if you need assistance or instructions on stopping the use of alcohol, cigarettes, and/or drugs.  Brush your teeth twice a day with fluoride toothpaste, and floss once a day. Good oral hygiene prevents tooth decay and gum disease. The problems can be painful, unattractive, and can cause other health problems. Visit your dentist for a routine oral  and dental check up and preventive care every 6-12 months.   Look at your skin regularly.  Use a mirror to look at your back. Notify your caregivers of changes in moles, especially if there are changes in shapes, colors, a size larger than a pencil eraser, an irregular border, or development of new moles.  Safety:  Use seatbelts 100% of the time, whether driving or as a passenger.  Use safety devices such as hearing protection if you work in environments with loud noise or significant background noise.  Use safety glasses when doing any work that could send debris in to the eyes.  Use a helmet if you ride a bike or motorcycle.  Use appropriate safety gear for contact sports.  Talk to your caregiver about gun safety.  Use sunscreen with a SPF (or skin protection factor) of 15 or greater.  Lighter skinned people are at a greater risk of skin cancer. Don't forget to also wear sunglasses in order to protect your eyes from too much damaging sunlight. Damaging sunlight can accelerate cataract  formation.   Practice safe sex. Use condoms. Condoms are used for birth control and to help reduce the spread of sexually transmitted infections (or STIs).  Some of the STIs are gonorrhea (the clap), chlamydia, syphilis, trichomonas, herpes, HPV (human papilloma virus) and HIV (human immunodeficiency virus) which causes AIDS. The herpes, HIV and HPV are viral illnesses that have no cure. These can result in disability, cancer and death.   Keep carbon monoxide and smoke detectors in your home functioning at all times. Change the batteries every 6 months or use a model that plugs into the wall.   Vaccinations:  Stay up to date with your tetanus shots and other required immunizations. You should have a booster for tetanus every 10 years. Be sure to get your flu shot every year, since 5%-20% of the [place] population comes down with the flu. The flu vaccine changes each year, so being vaccinated once is not enough. Get your  shot in the fall, before the flu season peaks.   Other vaccines to consider:  Human Papilloma Virus or HPV causes cancer of the cervix, and other infections that can be transmitted from person to person. There is a vaccine for HPV, and females should get immunized between the ages of 43 and 50. It requires a series of 3 shots.   Pneumococcal vaccine to protect against certain types of pneumonia.  This is normally recommended for adults age 83 or older.  However, adults younger than 78 years old with certain underlying conditions such as diabetes, heart or lung disease should also receive the vaccine.  Shingles vaccine to protect against Varicella Zoster if you are older than age 7, or younger than 78 years old with certain underlying illness.  Hepatitis A vaccine to protect against a form of infection of the liver by a virus acquired from food.  Hepatitis B vaccine to protect against a form of infection of the liver by a virus acquired from blood or body fluids, particularly if you work in health care.  If you plan to travel internationally, check with your local health department for specific vaccination recommendations.  Cancer Screening:  Breast cancer screening is essential to preventive care for women. All women age 58 and older should perform a breast self-exam every month. At age 29 and older, women should have their caregiver complete a breast exam each year. Women at ages 76 and older should have a mammogram (x-ray film) of the breasts. Your caregiver can discuss how often you need mammograms.    Cervical cancer screening includes taking a Pap smear (sample of cells examined under a microscope) from the cervix (end of the uterus). It also includes testing for HPV (Human Papilloma Virus, which can cause cervical cancer). Screening and a pelvic exam should begin at age 61, or 3 years after a woman becomes sexually active. Screening should occur every year, with a Pap smear but no HPV  testing, up to age 23. After age 2, you should have a Pap smear every 3 years with HPV testing, if no HPV was found previously.   Most routine colon cancer screening begins at the age of 58. On a yearly basis, doctors may provide special easy to use take-home tests to check for hidden blood in the stool. Sigmoidoscopy or colonoscopy can detect the earliest forms of colon cancer and is life saving. These tests use a small camera at the end of a tube to directly examine the colon. Speak to your caregiver about this at  age 27, when routine screening begins (and is repeated every 5 years unless early forms of pre-cancerous polyps or small growths are found).

## 2014-02-21 LAB — BASIC METABOLIC PANEL WITH GFR
BUN: 10 mg/dL (ref 6–23)
CHLORIDE: 100 meq/L (ref 96–112)
CO2: 28 meq/L (ref 19–32)
Calcium: 9.7 mg/dL (ref 8.4–10.5)
Creat: 0.75 mg/dL (ref 0.50–1.10)
GFR, EST NON AFRICAN AMERICAN: 76 mL/min
GFR, Est African American: 88 mL/min
Glucose, Bld: 97 mg/dL (ref 70–99)
Potassium: 4.6 mEq/L (ref 3.5–5.3)
Sodium: 138 mEq/L (ref 135–145)

## 2014-02-21 LAB — TSH: TSH: 2.345 u[IU]/mL (ref 0.350–4.500)

## 2014-02-21 LAB — HEPATIC FUNCTION PANEL
ALBUMIN: 4.2 g/dL (ref 3.5–5.2)
ALK PHOS: 75 U/L (ref 39–117)
ALT: 13 U/L (ref 0–35)
AST: 17 U/L (ref 0–37)
Bilirubin, Direct: 0.1 mg/dL (ref 0.0–0.3)
Indirect Bilirubin: 0.6 mg/dL (ref 0.2–1.2)
TOTAL PROTEIN: 7.1 g/dL (ref 6.0–8.3)
Total Bilirubin: 0.7 mg/dL (ref 0.2–1.2)

## 2014-02-21 LAB — LIPID PANEL
Cholesterol: 270 mg/dL — ABNORMAL HIGH (ref 0–200)
HDL: 46 mg/dL (ref >39)
LDL Cholesterol: 148 mg/dL — ABNORMAL HIGH (ref 0–99)
Total CHOL/HDL Ratio: 5.9 Ratio
Triglycerides: 378 mg/dL — ABNORMAL HIGH (ref <150)
VLDL: 76 mg/dL — ABNORMAL HIGH (ref 0–40)

## 2014-02-21 LAB — CBC WITH DIFFERENTIAL/PLATELET
Basophils Absolute: 0 10*3/uL (ref 0.0–0.1)
Basophils Relative: 0 % (ref 0–1)
Eosinophils Absolute: 0.1 10*3/uL (ref 0.0–0.7)
Eosinophils Relative: 1 % (ref 0–5)
HCT: 43.6 % (ref 36.0–46.0)
Hemoglobin: 14.7 g/dL (ref 12.0–15.0)
LYMPHS ABS: 1.7 10*3/uL (ref 0.7–4.0)
Lymphocytes Relative: 19 % (ref 12–46)
MCH: 30.8 pg (ref 26.0–34.0)
MCHC: 33.7 g/dL (ref 30.0–36.0)
MCV: 91.4 fL (ref 78.0–100.0)
MONOS PCT: 8 % (ref 3–12)
Monocytes Absolute: 0.7 10*3/uL (ref 0.1–1.0)
NEUTROS ABS: 6.6 10*3/uL (ref 1.7–7.7)
NEUTROS PCT: 72 % (ref 43–77)
RBC: 4.77 MIL/uL (ref 3.87–5.11)
RDW: 13.5 % (ref 11.5–15.5)
WBC: 9.1 10*3/uL (ref 4.0–10.5)

## 2014-02-21 LAB — VITAMIN D 25 HYDROXY (VIT D DEFICIENCY, FRACTURES): VIT D 25 HYDROXY: 55 ng/mL (ref 30–89)

## 2014-02-21 LAB — MAGNESIUM: Magnesium: 1.9 mg/dL (ref 1.5–2.5)

## 2014-02-21 LAB — HEMOGLOBIN A1C
HEMOGLOBIN A1C: 5.8 % — AB (ref <5.7)
MEAN PLASMA GLUCOSE: 120 mg/dL — AB (ref <117)

## 2014-02-28 ENCOUNTER — Ambulatory Visit (INDEPENDENT_AMBULATORY_CARE_PROVIDER_SITE_OTHER): Payer: Medicare Other | Admitting: *Deleted

## 2014-02-28 DIAGNOSIS — I482 Chronic atrial fibrillation, unspecified: Secondary | ICD-10-CM

## 2014-02-28 DIAGNOSIS — I4891 Unspecified atrial fibrillation: Secondary | ICD-10-CM

## 2014-02-28 DIAGNOSIS — Z5181 Encounter for therapeutic drug level monitoring: Secondary | ICD-10-CM

## 2014-02-28 LAB — POCT INR: INR: 1.9

## 2014-03-01 ENCOUNTER — Telehealth: Payer: Self-pay | Admitting: Internal Medicine

## 2014-03-01 NOTE — Telephone Encounter (Signed)
Patient called to advise that she had SCHEDULED DEXA FOR 03-02-14 AT [place].  Thank you, Katrina Judeth Horn [facility] [phone] Fax [phone]

## 2014-03-02 ENCOUNTER — Ambulatory Visit (HOSPITAL_COMMUNITY)
Admission: RE | Admit: 2014-03-02 | Discharge: 2014-03-02 | Disposition: A | Discharge status: Home / Self Care | Payer: Medicare Other | Source: Ambulatory Visit | Attending: Physician Assistant | Admitting: Physician Assistant

## 2014-03-02 DIAGNOSIS — Z780 Asymptomatic menopausal state: Secondary | ICD-10-CM | POA: Diagnosis not present

## 2014-03-02 DIAGNOSIS — M8580 Other specified disorders of bone density and structure, unspecified site: Secondary | ICD-10-CM | POA: Diagnosis not present

## 2014-03-02 DIAGNOSIS — Z09 Encounter for follow-up examination after completed treatment for conditions other than malignant neoplasm: Secondary | ICD-10-CM | POA: Insufficient documentation

## 2014-03-02 DIAGNOSIS — E559 Vitamin D deficiency, unspecified: Secondary | ICD-10-CM | POA: Diagnosis not present

## 2014-03-02 DIAGNOSIS — M810 Age-related osteoporosis without current pathological fracture: Secondary | ICD-10-CM

## 2014-03-14 ENCOUNTER — Ambulatory Visit (INDEPENDENT_AMBULATORY_CARE_PROVIDER_SITE_OTHER): Payer: Medicare Other | Admitting: Pharmacist Clinician (PhC)/ Clinical Pharmacy Specialist

## 2014-03-14 DIAGNOSIS — I482 Chronic atrial fibrillation, unspecified: Secondary | ICD-10-CM

## 2014-03-14 DIAGNOSIS — I4891 Unspecified atrial fibrillation: Secondary | ICD-10-CM

## 2014-03-14 DIAGNOSIS — Z5181 Encounter for therapeutic drug level monitoring: Secondary | ICD-10-CM

## 2014-03-14 LAB — POCT INR: INR: 2

## 2014-04-04 ENCOUNTER — Ambulatory Visit (INDEPENDENT_AMBULATORY_CARE_PROVIDER_SITE_OTHER): Payer: Medicare Other | Admitting: *Deleted

## 2014-04-04 DIAGNOSIS — I482 Chronic atrial fibrillation, unspecified: Secondary | ICD-10-CM

## 2014-04-04 DIAGNOSIS — I4891 Unspecified atrial fibrillation: Secondary | ICD-10-CM

## 2014-04-04 DIAGNOSIS — Z5181 Encounter for therapeutic drug level monitoring: Secondary | ICD-10-CM

## 2014-04-04 LAB — POCT INR: INR: 1.5

## 2014-04-18 ENCOUNTER — Encounter: Payer: Self-pay | Admitting: Podiatry

## 2014-04-18 ENCOUNTER — Ambulatory Visit: Payer: Medicare Other | Admitting: Cardiovascular Disease

## 2014-04-18 ENCOUNTER — Ambulatory Visit (INDEPENDENT_AMBULATORY_CARE_PROVIDER_SITE_OTHER): Payer: Medicare Other | Admitting: Podiatry

## 2014-04-18 VITALS — BP 129/85 | HR 87 | Resp 16

## 2014-04-18 DIAGNOSIS — M779 Enthesopathy, unspecified: Secondary | ICD-10-CM

## 2014-04-18 DIAGNOSIS — M2580 Other specified joint disorders, unspecified joint: Secondary | ICD-10-CM

## 2014-04-18 DIAGNOSIS — M76829 Posterior tibial tendinitis, unspecified leg: Secondary | ICD-10-CM

## 2014-04-18 DIAGNOSIS — M778 Other enthesopathies, not elsewhere classified: Secondary | ICD-10-CM

## 2014-04-18 DIAGNOSIS — M7751 Other enthesopathy of right foot: Secondary | ICD-10-CM

## 2014-04-18 DIAGNOSIS — M6789 Other specified disorders of synovium and tendon, multiple sites: Secondary | ICD-10-CM

## 2014-04-18 DIAGNOSIS — M65271 Calcific tendinitis, right ankle and foot: Secondary | ICD-10-CM

## 2014-04-18 NOTE — Progress Notes (Signed)
She presents today with severe knee pain genu valgum and pain along the dorsal medial aspect of the right foot and along the plantar medial aspect of the first metatarsophalangeal joint right foot.  Objective: Vital signs are stable she is alert and oriented 3. She has pain on palpation medial aspect of Lisfranc's joint right foot. As well as the pain on palpation to the plantar medial aspect of the first metatarsophalangeal joint around the sesamoids.  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  Plan: Injected Kenalog to the dorsal aspect of the right foot in dexamethasone to the plantar aspect of the forefoot right.

## 2014-04-19 ENCOUNTER — Ambulatory Visit (INDEPENDENT_AMBULATORY_CARE_PROVIDER_SITE_OTHER): Payer: Medicare Other | Admitting: *Deleted

## 2014-04-19 DIAGNOSIS — I4891 Unspecified atrial fibrillation: Secondary | ICD-10-CM

## 2014-04-19 DIAGNOSIS — Z5181 Encounter for therapeutic drug level monitoring: Secondary | ICD-10-CM

## 2014-04-19 DIAGNOSIS — I482 Chronic atrial fibrillation, unspecified: Secondary | ICD-10-CM

## 2014-04-19 LAB — POCT INR: INR: 3.1

## 2014-04-26 ENCOUNTER — Encounter: Payer: Self-pay | Admitting: Emergency Medicine

## 2014-05-10 NOTE — Progress Notes (Signed)
Patient ID: Pamela Whitaker, female   DOB: February 05, 1933, 80 y.o.   MRN: [id number] Ms. Beckstrom is seen in f/u today She has a history of PAF. No longer on Multaq. Stopped amiodarone in the past as well. Managed with rate control and anticoagulation with coumadin. She has had a reduced EF of 30% in the past.  I saw her 2 weeks ago after she had been to her PCP. EKG there showed atrial fib with frequent PVC's. She has missed some doses of her Metoprolol. Falling. We checked a CT scan of her head. She remains on Coumadin. We updated her echo which shows improvement and placed a Holter.  She comes back today. She is here with her daughter. She says she has not fallen since she was last here. Still very unsteady due to this deformity of her feet. Still forgets her evening dose of Metoprolol. Still just using a cane and not a walker as advised. Has multiple questions about her rhythm. Does not understand the reason for the way her atrial fib is managed.   Primary decreased coreg to 6.25 bid for dizzyness Might be better Husband died Jul 09, 2022  Discussed changing to novel agent but she gets confused     ROS: Denies fever, malais, weight loss, blurry vision, decreased visual acuity, cough, sputum, SOB, hemoptysis, pleuritic pain, palpitaitons, heartburn, abdominal pain, melena, lower extremity edema, claudication, or rash.  All other systems reviewed and negative  General: Affect appropriate Healthy:  appears stated age 56: normal Neck supple with no adenopathy JVP normal no bruits no thyromegaly Lungs clear with no wheezing and good diaphragmatic motion Heart:  S1/S2 no murmur, no rub, gallop or click PMI normal Abdomen: benighn, BS positve, no tenderness, no AAA no bruit.  No HSM or HJR Distal pulses intact with no bruits No edema Neuro non-focal Skin warm and dry No muscular weakness   Current Outpatient Prescriptions  Medication Sig Dispense Refill  . carvedilol (COREG) 6.25 MG tablet  Take 1 tablet (6.25 mg total) by mouth 2 (two) times daily with a meal. 60 tablet 11  . Cholecalciferol (VITAMIN D) 2000 UNITS tablet Take 2,000 Units by mouth 2 (two) times daily. Does not take regularly    . fluticasone (FLONASE) 50 MCG/ACT nasal spray Place 1 spray into both nostrils daily. 16 g 6  . Multiple Vitamin (MULTIVITAMIN) capsule Take 1 capsule by mouth daily. Women's Centrum    . warfarin (COUMADIN) 2.5 MG tablet Take as directed by coumadin clinic 35 tablet 3   No current facility-administered medications for this visit.    Allergies  Amiodarone; Lipitor; and Multaq  Electrocardiogram:  afib rate 79 nonspecific ST/T wave changes   Assessment and Plan

## 2014-05-11 ENCOUNTER — Ambulatory Visit (INDEPENDENT_AMBULATORY_CARE_PROVIDER_SITE_OTHER): Payer: Medicare Other | Admitting: *Deleted

## 2014-05-11 ENCOUNTER — Ambulatory Visit (INDEPENDENT_AMBULATORY_CARE_PROVIDER_SITE_OTHER): Payer: Medicare Other | Admitting: Cardiovascular Disease

## 2014-05-11 ENCOUNTER — Encounter: Payer: Self-pay | Admitting: Cardiovascular Disease

## 2014-05-11 VITALS — BP 116/58 | HR 84 | Ht 64.5 in | Wt 152.4 lb

## 2014-05-11 DIAGNOSIS — E785 Hyperlipidemia, unspecified: Secondary | ICD-10-CM

## 2014-05-11 DIAGNOSIS — I4891 Unspecified atrial fibrillation: Secondary | ICD-10-CM

## 2014-05-11 DIAGNOSIS — I482 Chronic atrial fibrillation, unspecified: Secondary | ICD-10-CM

## 2014-05-11 DIAGNOSIS — I10 Essential (primary) hypertension: Secondary | ICD-10-CM

## 2014-05-11 DIAGNOSIS — Z5181 Encounter for therapeutic drug level monitoring: Secondary | ICD-10-CM

## 2014-05-11 LAB — POCT INR: INR: 2.4

## 2014-05-11 NOTE — Assessment & Plan Note (Signed)
Cholesterol is at goal.  Continue current dose of statin and diet Rx.  No myalgias or side effects.  F/U  LFT's in 6 months. Lab Results  Component Value Date   LDLCALC 148* 02/20/2014

## 2014-05-11 NOTE — Patient Instructions (Signed)
Your physician recommends that you continue on your current medications as directed. Please refer to the Current Medication list given to you today.  Your physician wants you to follow-up in: 6 months with Dr. Nishan. You will receive a reminder letter in the mail two months in advance. If you don't receive a letter, please call our office to schedule the follow-up appointment.  

## 2014-05-11 NOTE — Assessment & Plan Note (Signed)
Chronic good rate control and anticoagulation.  Coumadin clinic today to check INR no bleeding issues.  Discussed NOAC but she's not really capable of making decision to change and has done fine on coumadin

## 2014-05-11 NOTE — Assessment & Plan Note (Signed)
Well controlled.  Continue current medications and low sodium Dash type diet.    

## 2014-05-24 ENCOUNTER — Encounter: Payer: Self-pay | Admitting: Emergency Medicine

## 2014-05-31 ENCOUNTER — Encounter: Payer: Self-pay | Admitting: Emergency Medicine

## 2014-05-31 ENCOUNTER — Ambulatory Visit (INDEPENDENT_AMBULATORY_CARE_PROVIDER_SITE_OTHER): Payer: 59 | Admitting: Emergency Medicine

## 2014-05-31 VITALS — BP 126/74 | HR 72 | Temp 98.0°F | Resp 16 | Ht 64.25 in | Wt 150.0 lb

## 2014-05-31 DIAGNOSIS — I10 Essential (primary) hypertension: Secondary | ICD-10-CM

## 2014-05-31 DIAGNOSIS — Z0000 Encounter for general adult medical examination without abnormal findings: Secondary | ICD-10-CM

## 2014-05-31 DIAGNOSIS — I482 Chronic atrial fibrillation, unspecified: Secondary | ICD-10-CM

## 2014-05-31 DIAGNOSIS — Z23 Encounter for immunization: Secondary | ICD-10-CM

## 2014-05-31 DIAGNOSIS — R6889 Other general symptoms and signs: Secondary | ICD-10-CM

## 2014-05-31 DIAGNOSIS — R350 Frequency of micturition: Secondary | ICD-10-CM

## 2014-05-31 DIAGNOSIS — E782 Mixed hyperlipidemia: Secondary | ICD-10-CM

## 2014-05-31 DIAGNOSIS — Z1211 Encounter for screening for malignant neoplasm of colon: Secondary | ICD-10-CM

## 2014-05-31 DIAGNOSIS — R739 Hyperglycemia, unspecified: Secondary | ICD-10-CM

## 2014-05-31 DIAGNOSIS — R351 Nocturia: Secondary | ICD-10-CM

## 2014-05-31 DIAGNOSIS — Z0001 Encounter for general adult medical examination with abnormal findings: Secondary | ICD-10-CM

## 2014-05-31 DIAGNOSIS — E538 Deficiency of other specified B group vitamins: Secondary | ICD-10-CM

## 2014-05-31 DIAGNOSIS — G4700 Insomnia, unspecified: Secondary | ICD-10-CM

## 2014-05-31 DIAGNOSIS — R5381 Other malaise: Secondary | ICD-10-CM

## 2014-05-31 DIAGNOSIS — R239 Unspecified skin changes: Secondary | ICD-10-CM

## 2014-05-31 DIAGNOSIS — R5383 Other fatigue: Secondary | ICD-10-CM

## 2014-05-31 DIAGNOSIS — E559 Vitamin D deficiency, unspecified: Secondary | ICD-10-CM

## 2014-05-31 LAB — HEPATIC FUNCTION PANEL
ALT: 14 U/L (ref 0–35)
AST: 17 U/L (ref 0–37)
Albumin: 4 g/dL (ref 3.5–5.2)
Alkaline Phosphatase: 68 U/L (ref 39–117)
BILIRUBIN DIRECT: 0.2 mg/dL (ref 0.0–0.3)
BILIRUBIN INDIRECT: 0.4 mg/dL (ref 0.2–1.2)
BILIRUBIN TOTAL: 0.6 mg/dL (ref 0.2–1.2)
Total Protein: 6.8 g/dL (ref 6.0–8.3)

## 2014-05-31 LAB — BASIC METABOLIC PANEL WITH GFR
BUN: 10 mg/dL (ref 6–23)
CALCIUM: 9.3 mg/dL (ref 8.4–10.5)
CO2: 28 mEq/L (ref 19–32)
Chloride: 103 mEq/L (ref 96–112)
Creat: 0.61 mg/dL (ref 0.50–1.10)
GFR, EST NON AFRICAN AMERICAN: 87 mL/min
GFR, Est African American: >89 mL/min
Glucose, Bld: 86 mg/dL (ref 70–99)
Potassium: 4.3 mEq/L (ref 3.5–5.3)
Sodium: 140 mEq/L (ref 135–145)

## 2014-05-31 LAB — LIPID PANEL
Cholesterol: 242 mg/dL — ABNORMAL HIGH (ref 0–200)
HDL: 54 mg/dL (ref >39)
LDL Cholesterol: 148 mg/dL — ABNORMAL HIGH (ref 0–99)
Total CHOL/HDL Ratio: 4.5 Ratio
Triglycerides: 201 mg/dL — ABNORMAL HIGH (ref <150)
VLDL: 40 mg/dL (ref 0–40)

## 2014-05-31 LAB — MAGNESIUM: Magnesium: 1.9 mg/dL (ref 1.5–2.5)

## 2014-05-31 LAB — TSH: TSH: 2.298 u[IU]/mL (ref 0.350–4.500)

## 2014-05-31 LAB — VITAMIN B12: Vitamin B-12: 947 pg/mL — ABNORMAL HIGH (ref 211–911)

## 2014-05-31 MED ORDER — FLUTICASONE PROPIONATE 50 MCG/ACT NA SUSP: 1.0000 | Freq: Every day | NASAL | Status: DC

## 2014-05-31 NOTE — Patient Instructions (Addendum)
NO TV 1 hour before bed, Melatonin every evening, peanut butter snack 1 hour before bed.  Insomnia Insomnia is frequent trouble falling and/or staying asleep. Insomnia can be a long term problem or a short term problem. Both are common. Insomnia can be a short term problem when the wakefulness is related to a certain stress or worry. Long term insomnia is often related to ongoing stress during waking hours and/or poor sleeping habits. Overtime, sleep deprivation itself can make the problem worse. Every little thing feels more severe because you are overtired and your ability to cope is decreased. CAUSES   Stress, anxiety, and depression.  Poor sleeping habits.  Distractions such as TV in the bedroom.  Naps close to bedtime.  Engaging in emotionally charged conversations before bed.  Technical reading before sleep.  Alcohol and other sedatives. They may make the problem worse. They can hurt normal sleep patterns and normal dream activity.  Stimulants such as caffeine for several hours prior to bedtime.  Pain syndromes and shortness of breath can cause insomnia.  Exercise late at night.  Changing time zones may cause sleeping problems (jet lag). It is sometimes helpful to have someone observe your sleeping patterns. They should look for periods of not breathing during the night (sleep apnea). They should also look to see how long those periods last. If you live alone or observers are uncertain, you can also be observed at a sleep clinic where your sleep patterns will be professionally monitored. Sleep apnea requires a checkup and treatment. Give your caregivers your medical history. Give your caregivers observations your family has made about your sleep.  SYMPTOMS   Not feeling rested in the morning.  Anxiety and restlessness at bedtime.  Difficulty falling and staying asleep. TREATMENT   Your caregiver may prescribe treatment for an underlying medical disorders. Your caregiver  can give advice or help if you are using alcohol or other drugs for self-medication. Treatment of underlying problems will usually eliminate insomnia problems.  Medications can be prescribed for short time use. They are generally not recommended for lengthy use.  Over-the-counter sleep medicines are not recommended for lengthy use. They can be habit forming.  You can promote easier sleeping by making lifestyle changes such as:  Using relaxation techniques that help with breathing and reduce muscle tension.  Exercising earlier in the day.  Changing your diet and the time of your last meal. No night time snacks.  Establish a regular time to go to bed.  Counseling can help with stressful problems and worry.  Soothing music and white noise may be helpful if there are background noises you cannot remove.  Stop tedious detailed work at least one hour before bedtime. HOME CARE INSTRUCTIONS   Keep a diary. Inform your caregiver about your progress. This includes any medication side effects. See your caregiver regularly. Take note of:  Times when you are asleep.  Times when you are awake during the night.  The quality of your sleep.  How you feel the next day. This information will help your caregiver care for you.  Get out of bed if you are still awake after 15 minutes. Read or do some quiet activity. Keep the lights down. Wait until you feel sleepy and go back to bed.  Keep regular sleeping and waking hours. Avoid naps.  Exercise regularly.  Avoid distractions at bedtime. Distractions include watching television or engaging in any intense or detailed activity like attempting to balance the household checkbook.  Develop a  bedtime ritual. Keep a familiar routine of bathing, brushing your teeth, climbing into bed at the same time each night, listening to soothing music. Routines increase the success of falling to sleep faster.  Use relaxation techniques. This can be using breathing  and muscle tension release routines. It can also include visualizing peaceful scenes. You can also help control troubling or intruding thoughts by keeping your mind occupied with boring or repetitive thoughts like the old concept of counting sheep. You can make it more creative like imagining planting one beautiful flower after another in your backyard garden.  During your day, work to eliminate stress. When this is not possible use some of the previous suggestions to help reduce the anxiety that accompanies stressful situations. MAKE SURE YOU:   Understand these instructions.  Will watch your condition.  Will get help right away if you are not doing well or get worse. Document Released: 05/09/2000 Document Revised: 08/04/2011 Document Reviewed: 06/09/2007 [facility] Patient Information 2015 [place], [place]. This information is not intended to replace advice given to you by your health care provider. Make sure you discuss any questions you have with your health care provider.   Pneumococcal Vaccine, Polyvalent solution for injection What is this medicine? PNEUMOCOCCAL VACCINE, POLYVALENT (NEU mo KOK al vak SEEN, pol ee VEY luhnt) is a vaccine to prevent pneumococcus bacteria infection. These bacteria are a major cause of ear infections, Strep throat infections, and serious pneumonia, meningitis, or blood infections worldwide. These vaccines help the body to produce antibodies (protective substances) that help your body defend against these bacteria. This vaccine is recommended for people 79 years of age and older with health problems. It is also recommended for all adults over 79 years old. This vaccine will not treat an infection. This medicine may be used for other purposes; ask your health care provider or pharmacist if you have questions. COMMON BRAND NAME(S): Pneumovax 23 What should I tell my health care provider before I take this medicine? They need to know if you have any of these  conditions: -bleeding problems -bone marrow or organ transplant -cancer, Hodgkin's disease -fever -infection -immune system problems -low platelet count in the blood -seizures -an unusual or allergic reaction to pneumococcal vaccine, diphtheria toxoid, other vaccines, latex, other medicines, foods, dyes, or preservatives -pregnant or trying to get pregnant -breast-feeding How should I use this medicine? This vaccine is for injection into a muscle or under the skin. It is given by a health care professional. A copy of Vaccine Information Statements will be given before each vaccination. Read this sheet carefully each time. The sheet may change frequently. Talk to your pediatrician regarding the use of this medicine in children. While this drug may be prescribed for children as young as 47 years of age for selected conditions, precautions do apply. Overdosage: If you think you have taken too much of this medicine contact a poison control center or emergency room at once. NOTE: This medicine is only for you. Do not share this medicine with others. What if I miss a dose? It is important not to miss your dose. Call your doctor or health care professional if you are unable to keep an appointment. What may interact with this medicine? -medicines for cancer chemotherapy -medicines that suppress your immune function -medicines that treat or prevent blood clots like warfarin, enoxaparin, and dalteparin -steroid medicines like prednisone or cortisone This list may not describe all possible interactions. Give your health care provider a list of all the medicines,  herbs, non-prescription drugs, or dietary supplements you use. Also tell them if you smoke, drink alcohol, or use illegal drugs. Some items may interact with your medicine. What should I watch for while using this medicine? Mild fever and pain should go away in 3 days or less. Report any unusual symptoms to your doctor or health care  professional. What side effects may I notice from receiving this medicine? Side effects that you should report to your doctor or health care professional as soon as possible: -allergic reactions like skin rash, itching or hives, swelling of the face, lips, or tongue -breathing problems -confused -fever over 102 degrees F -pain, tingling, numbness in the hands or feet -seizures -unusual bleeding or bruising -unusual muscle weakness Side effects that usually do not require medical attention (report to your doctor or health care professional if they continue or are bothersome): -aches and pains -diarrhea -fever of 102 degrees F or less -headache -irritable -loss of appetite -pain, tender at site where injected -trouble sleeping This list may not describe all possible side effects. Call your doctor for medical advice about side effects. You may report side effects to FDA at 1-800-FDA-1088. Where should I keep my medicine? This does not apply. This vaccine is given in a clinic, pharmacy, doctor's office, or other health care setting and will not be stored at home. NOTE: This sheet is a summary. It may not cover all possible information. If you have questions about this medicine, talk to your doctor, pharmacist, or health care provider.  2015, Elsevier/Gold Standard. (2007-12-17 14:32:37)

## 2014-06-01 LAB — URINALYSIS, ROUTINE W REFLEX MICROSCOPIC
Bilirubin Urine: NEGATIVE
GLUCOSE, UA: NEGATIVE mg/dL
Ketones, ur: NEGATIVE mg/dL
Leukocytes, UA: NEGATIVE
Nitrite: NEGATIVE
PH: 6.5 (ref 5.0–8.0)
Protein, ur: NEGATIVE mg/dL
Specific Gravity, Urine: 1.011 (ref 1.005–1.030)
Urobilinogen, UA: 0.2 mg/dL (ref 0.0–1.0)

## 2014-06-01 LAB — INSULIN, FASTING: Insulin fasting, serum: 4.2 u[IU]/mL (ref 2.0–19.6)

## 2014-06-01 LAB — VITAMIN D 25 HYDROXY (VIT D DEFICIENCY, FRACTURES): Vit D, 25-Hydroxy: 36 ng/mL (ref 30–100)

## 2014-06-01 LAB — CBC WITH DIFFERENTIAL/PLATELET
Basophils Absolute: 0.1 10*3/uL (ref 0.0–0.1)
Basophils Relative: 1 % (ref 0–1)
EOS ABS: 0.2 10*3/uL (ref 0.0–0.7)
Eosinophils Relative: 2 % (ref 0–5)
HCT: 42.6 % (ref 36.0–46.0)
HEMOGLOBIN: 14.5 g/dL (ref 12.0–15.0)
LYMPHS ABS: 1.9 10*3/uL (ref 0.7–4.0)
Lymphocytes Relative: 23 % (ref 12–46)
MCH: 30.7 pg (ref 26.0–34.0)
MCHC: 34 g/dL (ref 30.0–36.0)
MCV: 90.1 fL (ref 78.0–100.0)
MPV: 9.6 fL (ref 8.6–12.4)
Monocytes Absolute: 0.6 10*3/uL (ref 0.1–1.0)
Monocytes Relative: 7 % (ref 3–12)
Neutro Abs: 5.5 10*3/uL (ref 1.7–7.7)
Neutrophils Relative %: 67 % (ref 43–77)
Platelets: 111 10*3/uL — ABNORMAL LOW (ref 150–400)
RBC: 4.73 MIL/uL (ref 3.87–5.11)
RDW: 13.6 % (ref 11.5–15.5)
WBC: 8.2 10*3/uL (ref 4.0–10.5)

## 2014-06-01 LAB — URINALYSIS, MICROSCOPIC ONLY
Bacteria, UA: NONE SEEN
CASTS: NONE SEEN
Crystals: NONE SEEN
SQUAMOUS EPITHELIAL / LPF: NONE SEEN

## 2014-06-01 LAB — HEMOGLOBIN A1C
HEMOGLOBIN A1C: 5.6 % (ref <5.7)
MEAN PLASMA GLUCOSE: 114 mg/dL (ref <117)

## 2014-06-01 LAB — MICROALBUMIN / CREATININE URINE RATIO
Creatinine, Urine: 68.5 mg/dL
Microalb Creat Ratio: 7.3 mg/g (ref 0.0–30.0)
Microalb, Ur: 0.5 mg/dL (ref <2.0)

## 2014-06-01 LAB — FOLATE RBC: RBC Folate: >1000 ng/mL (ref >280)

## 2014-06-02 LAB — URINE CULTURE

## 2014-06-03 ENCOUNTER — Other Ambulatory Visit: Payer: Self-pay | Admitting: Emergency Medicine

## 2014-06-03 MED ORDER — AMOXICILLIN 500 MG PO CAPS: 500.0000 mg | ORAL_CAPSULE | Freq: Three times a day (TID) | ORAL | Status: DC

## 2014-06-05 ENCOUNTER — Encounter: Payer: Self-pay | Admitting: Emergency Medicine

## 2014-06-05 DIAGNOSIS — K5790 Diverticulosis of intestine, part unspecified, without perforation or abscess without bleeding: Secondary | ICD-10-CM | POA: Insufficient documentation

## 2014-06-05 NOTE — Progress Notes (Signed)
Subjective:    Patient ID: Pamela Whitaker, female    DOB: [birth year], 81 y.o.   MRN: [id number]  HPI Comments: 81 yo pleasant WF CPE and presents for 3 month F/U for HTN, Cholesterol, Pre-Dm, D. Deficient. She has been doing well overall and keeping busy with her estate affairs. She notes she is dealing with her depression better but id still having trouble with sleep. She notes difficulty falling asleep and staying asleep. She has tried Melatonin on/ off without relief. She notes she has mild fatigue with the sleep disturbance. She is exercising 1-3 times weekly in the water. She is trying to eat healthier. She still continues to decline cholesterol RX despite elevated labs and risk.  She has had a mild increase with nocturia and urine incontinence. She denies dysuria/ hematuria.   She has noticed a few new skin spots on left arm and has had itchon back of neck     Medication List       This list is accurate as of: 05/31/14 11:59 PM.  Always use your most recent med list.               carvedilol 6.25 MG tablet  Commonly known as:  COREG  Take 1 tablet (6.25 mg total) by mouth 2 (two) times daily with a meal.     fluticasone 50 MCG/ACT nasal spray  Commonly known as:  FLONASE  Place 1 spray into both nostrils daily.     multivitamin capsule  Take 1 capsule by mouth daily. Women's Centrum     VITAMIN B 12 PO  Take by mouth daily.     Vitamin D 2000 UNITS tablet  Take 2,000 Units by mouth 2 (two) times daily. Does not take regularly     warfarin 2.5 MG tablet  Commonly known as:  COUMADIN  Take as directed by coumadin clinic       Allergies  Allergen Reactions  . Amiodarone     Stopped on her own  . Lipitor [Atorvastatin]     weakness  . Multaq [Dronedarone]     Stopped on her own   Past Medical History  Diagnosis Date  . Atrial fibrillation     treated with multaq x 6 mos in 2011....[name]=2 (Age; + CHF; no CVA; no DM2). Echo 05/24/10: EF 30% mild AS(mean  40mmgHg); mild LAE; mod LAE; PASP 35 Systolic CHF  . Cardiomyopathy     a. tachy mediated Myoview 06/06/10:  EF 31%; no scar or ischemia; EF up to 50% per echo November 2013  . Breast cancer      in 1987 with a left  mastectomy  . Osteoporosis     history of bone chips in the right knee,previous right fibular fracture, operated on by Dr. Veverly Fells  . RLS (restless legs syndrome)   . Lumbar stenosis   . Hypercholesteremia   . DJD (degenerative joint disease) of knee     right knee  . Falls   . Hypertension   . Vitamin D deficiency    Past Surgical History  Procedure Laterality Date  . Knee arthroscopy  2005  . Mastectomy, radical  1987    left breast  . Breast reconstruction  1988    left breast  . Wrist fracture surgery      left wrist   History  Substance Use Topics  . Smoking status: Never Smoker   . Smokeless tobacco: Never Used  . Alcohol Use: No   Family  History  Problem Relation Age of Onset  . Heart attack Father   . Aortic aneurysm Mother   . Cancer Sister     breast cancer  . Diabetes Sister    MAINTENANCE: Colonoscopy:2006 due 2016 Mammo:07/08/13 BMD:03/02/14 Osteopenic Pap/ Pelvic:2011 Declines further EYE: DUE 06/2014 Dentist:05/30/14 WNL Q 6 month ECHO: 04/21/12 EF 50-55%  IMMUNIZATIONS: Td:2002 Pneumovax:2008 (23), 05/2014 (13) Zostavax:2010 Influenza:2015   Patient Care Team: Unk Pinto, MD as PCP - General (Internal Medicine) Josue Hector, MD as Consulting Physician (Cardiology) Hayden Pedro, MD as Consulting Physician (Ophthalmology) Winfield Cunas., MD as Consulting Physician (Gastroenterology) Newt Minion, MD as Consulting Physician (Orthopedic Surgery) Danella Sensing, MD as Consulting Physician (Dermatology) Oneida Arenas, DDS (Dentistry)   Review of Systems  Constitutional: Positive for fatigue.  Respiratory: Negative for shortness of breath.   Cardiovascular: Negative for chest pain.  Genitourinary: Positive for difficulty  urinating.  Skin: Positive for color change.  Psychiatric/Behavioral: Positive for sleep disturbance.  All other systems reviewed and are negative.     BP 126/74 mmHg  Pulse 72  Temp(Src) 98 F (36.7 C) (Temporal)  Resp 16  Ht 5' 4.25" (1.632 m)  Wt 150 lb (68.04 kg)  BMI 25.55 kg/m2  Objective:   Physical Exam  Constitutional: She is oriented to person, place, and time. She appears well-developed and well-nourished. No distress.  HENT:  Head: Normocephalic and atraumatic.  Right Ear: External ear normal.  Left Ear: External ear normal.  Nose: Nose normal.  Mouth/Throat: Oropharynx is clear and moist.  Eyes: Conjunctivae and EOM are normal. Pupils are equal, round, and reactive to light. Right eye exhibits no discharge. Left eye exhibits no discharge. No scleral icterus.  Neck: Normal range of motion. Neck supple. No JVD present. No tracheal deviation present. No thyromegaly present.  Cardiovascular: Normal rate, regular rhythm, normal heart sounds and intact distal pulses.   Pulmonary/Chest: Effort normal and breath sounds normal.  Abdominal: Soft. Bowel sounds are normal. She exhibits no distension and no mass. There is no tenderness. There is no rebound and no guarding.  Genitourinary:  Declines GYN. Breasts- right WNL, Left with firm implant  Musculoskeletal: Normal range of motion. She exhibits no edema or tenderness.  Lymphadenopathy:    She has no cervical adenopathy.  Neurological: She is alert and oriented to person, place, and time. She has normal reflexes. No cranial nerve deficit. She exhibits normal muscle tone. Coordination normal.  Skin: Skin is warm and dry. Rash noted. No erythema. No pallor.  Left arm 1 cm erythematous scaling x 2 areas , posterior neck with mild erythema  Psychiatric: She has a normal mood and affect. Her behavior is normal. Judgment and thought content normal.  Nursing note and vitals reviewed.    AORTA SCAN WNL EKG NSCSPT       Assessment & Plan:  1. CPE- Update screening labs/ History/ Immunizations/ Testing as needed. Advised healthy diet, QD exercise, increase H20 and continue RX/ Vitamins AD.  2.  Skin changes/ Irreg Nevi- monitor for any change, we will refer to DR Ronnald Ramp for removal  3. Nocturia/ incontinence- check labs if NEG will need urology referral  4. 3 month F/U for HTN, Cholesterol, Pre-Dm, D. Deficient. Needs healthy diet, cardio QD and obtain healthy weight. Check Labs, Check BP if >130/80 call office   5.  Insomnia- Sleep hygiene discussed, increase daytime activity level call if no improvement

## 2014-06-07 ENCOUNTER — Other Ambulatory Visit (HOSPITAL_COMMUNITY): Payer: Self-pay | Admitting: *Deleted

## 2014-06-07 ENCOUNTER — Other Ambulatory Visit (HOSPITAL_COMMUNITY): Payer: Self-pay | Admitting: Plastic Surgery

## 2014-06-07 DIAGNOSIS — Z139 Encounter for screening, unspecified: Secondary | ICD-10-CM

## 2014-06-08 ENCOUNTER — Ambulatory Visit (INDEPENDENT_AMBULATORY_CARE_PROVIDER_SITE_OTHER): Payer: Medicare Other | Admitting: *Deleted

## 2014-06-08 DIAGNOSIS — I482 Chronic atrial fibrillation, unspecified: Secondary | ICD-10-CM

## 2014-06-08 DIAGNOSIS — Z5181 Encounter for therapeutic drug level monitoring: Secondary | ICD-10-CM

## 2014-06-08 DIAGNOSIS — I4891 Unspecified atrial fibrillation: Secondary | ICD-10-CM

## 2014-06-08 LAB — POCT INR: INR: 2.2

## 2014-06-22 ENCOUNTER — Other Ambulatory Visit (HOSPITAL_COMMUNITY): Payer: Self-pay

## 2014-06-22 DIAGNOSIS — C50919 Malignant neoplasm of unspecified site of unspecified female breast: Secondary | ICD-10-CM

## 2014-06-26 ENCOUNTER — Other Ambulatory Visit: Payer: Self-pay | Admitting: Cardiovascular Disease

## 2014-06-26 ENCOUNTER — Encounter (HOSPITAL_COMMUNITY): Payer: Medicare Other | Attending: Hematology & Oncology

## 2014-06-26 DIAGNOSIS — C50912 Malignant neoplasm of unspecified site of left female breast: Secondary | ICD-10-CM | POA: Diagnosis present

## 2014-06-26 DIAGNOSIS — C50919 Malignant neoplasm of unspecified site of unspecified female breast: Secondary | ICD-10-CM

## 2014-06-26 LAB — COMPREHENSIVE METABOLIC PANEL
ALBUMIN: 3.9 g/dL (ref 3.5–5.2)
ALK PHOS: 75 U/L (ref 39–117)
ALT: 19 U/L (ref 0–35)
AST: 21 U/L (ref 0–37)
Anion gap: 4 — ABNORMAL LOW (ref 5–15)
BUN: 13 mg/dL (ref 6–23)
CALCIUM: 9.5 mg/dL (ref 8.4–10.5)
CO2: 29 mmol/L (ref 19–32)
CREATININE: 0.67 mg/dL (ref 0.50–1.10)
Chloride: 106 mmol/L (ref 96–112)
GFR calc Af Amer: >90 mL/min (ref >90)
GFR calc non Af Amer: 81 mL/min — ABNORMAL LOW (ref >90)
Glucose, Bld: 88 mg/dL (ref 70–99)
POTASSIUM: 4.3 mmol/L (ref 3.5–5.1)
SODIUM: 139 mmol/L (ref 135–145)
TOTAL PROTEIN: 7.3 g/dL (ref 6.0–8.3)
Total Bilirubin: 0.6 mg/dL (ref 0.3–1.2)

## 2014-06-26 LAB — CBC WITH DIFFERENTIAL/PLATELET
BASOS ABS: 0.1 10*3/uL (ref 0.0–0.1)
Basophils Relative: 1 % (ref 0–1)
Eosinophils Absolute: 0.1 10*3/uL (ref 0.0–0.7)
Eosinophils Relative: 2 % (ref 0–5)
HCT: 41.7 % (ref 36.0–46.0)
HEMOGLOBIN: 14.1 g/dL (ref 12.0–15.0)
LYMPHS ABS: 1.7 10*3/uL (ref 0.7–4.0)
Lymphocytes Relative: 23 % (ref 12–46)
MCH: 31.1 pg (ref 26.0–34.0)
MCHC: 33.8 g/dL (ref 30.0–36.0)
MCV: 92.1 fL (ref 78.0–100.0)
MONO ABS: 0.7 10*3/uL (ref 0.1–1.0)
Monocytes Relative: 9 % (ref 3–12)
NEUTROS ABS: 4.9 10*3/uL (ref 1.7–7.7)
Neutrophils Relative %: 65 % (ref 43–77)
PLATELETS: 204 10*3/uL (ref 150–400)
RBC: 4.53 MIL/uL (ref 3.87–5.11)
RDW: 12.5 % (ref 11.5–15.5)
WBC: 7.5 10*3/uL (ref 4.0–10.5)

## 2014-06-26 NOTE — Progress Notes (Signed)
Labs for [id number],[id number],cmp  

## 2014-06-27 LAB — CANCER ANTIGEN 27.29: CA 27.29: 19.6 U/mL (ref 0.0–38.6)

## 2014-06-29 ENCOUNTER — Encounter (HOSPITAL_BASED_OUTPATIENT_CLINIC_OR_DEPARTMENT_OTHER): Payer: Medicare Other | Admitting: Hematology & Oncology

## 2014-06-29 ENCOUNTER — Encounter (HOSPITAL_COMMUNITY): Payer: Self-pay | Admitting: Hematology & Oncology

## 2014-06-29 VITALS — BP 114/65 | HR 78 | Temp 97.7°F | Resp 18 | Wt 152.7 lb

## 2014-06-29 DIAGNOSIS — Z853 Personal history of malignant neoplasm of breast: Secondary | ICD-10-CM | POA: Insufficient documentation

## 2014-06-29 DIAGNOSIS — C50912 Malignant neoplasm of unspecified site of left female breast: Secondary | ICD-10-CM

## 2014-06-29 NOTE — Progress Notes (Signed)
[place], MD [address] [facility] [address]    DIAGNOSIS:  Stage III adenocarcinoma of the breast surgery on April 10, 1986 consisting of a left mastectomy with reconstruction by Dr. Donnie Coffin is for 6.5 cm cancer with 2 of 27 positive lymph nodes status post CAF VP chemotherapy.   She took chemotherapy for a year followed by hormonal therapy for 12 months but was stopped due to intolerance. At that time, we had tried Megace and tamoxifen and she opted not to try anything further.  CURRENT THERAPY: Observation  INTERVAL HISTORY: AMIREE NO 81 y.o. female returns for follow-up of her breast cancer. Her only complaint is her knee. She also states she does not like to take coumadin because she has to avoid so many foods. She lost her husband about 2 years ago and states this is still very difficult for her. She denies any significant change in her appetite or energy level. Her limitation involves her "bad knee."  MEDICAL HISTORY: Past Medical History  Diagnosis Date  . Atrial fibrillation     treated with multaq x 6 mos in 2011....[name]=2 (Age; + CHF; no CVA; no DM2). Echo 05/24/10: EF 30% mild AS(mean 75mmgHg); mild LAE; mod LAE; PASP 35 Systolic CHF  . Cardiomyopathy     a. tachy mediated Myoview 06/06/10:  EF 31%; no scar or ischemia; EF up to 50% per echo November 2013  . Breast cancer      in 1987 with a left  mastectomy  . Osteoporosis     history of bone chips in the right knee,previous right fibular fracture, operated on by Dr. Veverly Fells  . RLS (restless legs syndrome)   . Lumbar stenosis   . Hypercholesteremia   . DJD (degenerative joint disease) of knee     right knee  . Falls   . Hypertension   . Vitamin D deficiency   . Diverticulosis     has Elevated lipids; RESTLESS LEGS SYNDROME; Atrial fibrillation; OSTEOARTHRITIS; CARDIOMYOPATHY; Unspecified systolic heart failure; Gait difficulty; Spinal stenosis, lumbar  region, with neurogenic claudication; Hypertension; Vitamin D deficiency; Encounter for therapeutic drug monitoring; Other abnormal glucose; Diverticulosis; and Breast cancer on her problem list.     is allergic to amiodarone; lipitor; and multaq.  Ms. Mauriello does not currently have medications on file.  SURGICAL HISTORY: Past Surgical History  Procedure Laterality Date  . Knee arthroscopy  2005  . Mastectomy, radical  1987    left breast  . Breast reconstruction  1988    left breast  . Wrist fracture surgery      left wrist  . Cardioversion  2012    SOCIAL HISTORY: History   Social History  . Marital Status: Married    Spouse Name: Chrissie Noa    Number of Children: 2  . Years of Education: college   Occupational History  . retired    Social History Main Topics  . Smoking status: Never Smoker   . Smokeless tobacco: Never Used  . Alcohol Use: No  . Drug Use: No  . Sexual Activity: No   Other Topics Concern  . Not on file   Social History Narrative    FAMILY HISTORY: Family History  Problem Relation Age of Onset  . Heart attack Father   . Aortic aneurysm Mother   . Cancer Sister     breast cancer  . Diabetes Sister   . Cancer Sister     stomach  Review of Systems  Constitutional: Negative.   HENT: Negative.   Eyes: Negative.   Respiratory: Negative.   Cardiovascular: Negative.   Gastrointestinal: Negative.   Genitourinary: Positive for urgency and frequency.       Nocturia   Musculoskeletal: Positive for joint pain. Negative for falls.       Uses cane  Skin: Negative.   Neurological: Negative.   Endo/Heme/Allergies: Negative.   Psychiatric/Behavioral: Negative for depression and suicidal ideas. The patient has insomnia.     PHYSICAL EXAMINATION  ECOG PERFORMANCE STATUS: 0 - Asymptomatic  Filed Vitals:   06/29/14 1136  BP: 114/65  Pulse: 78  Temp: 97.7 F (36.5 C)  Resp: 18    Physical Exam  Constitutional: She is oriented to  person, place, and time and well-developed, well-nourished, and in no distress.  HENT:  Head: Normocephalic and atraumatic.  Nose: Nose normal.  Mouth/Throat: Oropharynx is clear and moist. No oropharyngeal exudate.  Eyes: Conjunctivae and EOM are normal. Pupils are equal, round, and reactive to light. Right eye exhibits no discharge. Left eye exhibits no discharge. No scleral icterus.  Neck: Normal range of motion. Neck supple. No tracheal deviation present. No thyromegaly present.  Cardiovascular: Normal rate, regular rhythm and normal heart sounds.  Exam reveals no gallop and no friction rub.   No murmur heard. irreg irreg  Pulmonary/Chest: Effort normal and breath sounds normal. She has no wheezes. She has no rales.    Abdominal: Soft. Bowel sounds are normal. She exhibits no distension and no mass. There is no tenderness. There is no rebound and no guarding.  Musculoskeletal: Normal range of motion. She exhibits no edema.  Lymphadenopathy:    She has no cervical adenopathy.       Right cervical: No superficial cervical adenopathy present.      Left cervical: No superficial cervical adenopathy present.    She has axillary adenopathy.       Right axillary: No pectoral and no lateral adenopathy present.       Left axillary: No pectoral and no lateral adenopathy present. Neurological: She is alert and oriented to person, place, and time. She has normal reflexes. No cranial nerve deficit. Coordination normal.  Walks with limp secondary to R knee  Skin: Skin is warm and dry. No rash noted.  Psychiatric: Mood, memory, affect and judgment normal.  Nursing note and vitals reviewed.   LABORATORY DATA:  CBC    Component Value Date/Time   WBC 7.5 06/26/2014 1012   RBC 4.53 06/26/2014 1012   HGB 14.1 06/26/2014 1012   HCT 41.7 06/26/2014 1012   PLT 204 06/26/2014 1012   MCV 92.1 06/26/2014 1012   MCH 31.1 06/26/2014 1012   MCHC 33.8 06/26/2014 1012   RDW 12.5 06/26/2014 1012    LYMPHSABS 1.7 06/26/2014 1012   MONOABS 0.7 06/26/2014 1012   EOSABS 0.1 06/26/2014 1012   BASOSABS 0.1 06/26/2014 1012   CMP     Component Value Date/Time   NA 139 06/26/2014 1012   K 4.3 06/26/2014 1012   CL 106 06/26/2014 1012   CO2 29 06/26/2014 1012   GLUCOSE 88 06/26/2014 1012   BUN 13 06/26/2014 1012   CREATININE 0.67 06/26/2014 1012   CREATININE 0.61 05/31/2014 1320   CALCIUM 9.5 06/26/2014 1012   PROT 7.3 06/26/2014 1012   ALBUMIN 3.9 06/26/2014 1012   AST 21 06/26/2014 1012   ALT 19 06/26/2014 1012   ALKPHOS 75 06/26/2014 1012   BILITOT 0.6 06/26/2014 1012  GFRNONAA 81* 06/26/2014 1012   GFRNONAA 87 05/31/2014 1320   GFRAA >90 06/26/2014 1012   GFRAA >89 05/31/2014 1320       ASSESSMENT and THERAPY PLAN:    Breast cancer 81 year old female with a history of stage III carcinoma of the left breast diagnosed back in 1987. Exam today is unremarkable. She has no evidence of recurrence. Her major complaints revolve around a bad right knee. She is on Coumadin for atrial fibrillation and finds that inconvenient at times. But she is otherwise active, her appetite is good, and she is doing fairly well. He has a mammogram scheduled for the 16th of this month. We will see her back again in one year, sooner if needed.    All questions were answered. The patient knows to call the clinic with any problems, questions or concerns. We can certainly see the patient much sooner if necessary.  Molli Hazard 06/29/2014

## 2014-06-29 NOTE — Patient Instructions (Addendum)
[name] at [facility] Discharge Instructions  RECOMMENDATIONS MADE BY THE CONSULTANT AND ANY TEST RESULTS WILL BE SENT TO YOUR REFERRING PHYSICIAN.  Attached are your labs and vital signs. Report any new lumps, bone pain, shortness of breath or other symptoms.  Follow-up in 1 year with labs and office visit.  Results for Pamela Whitaker, Pamela Whitaker (MRN [id number]) as of 06/29/2014 12:19  Ref. Range 06/08/2014 11:47 06/26/2014 10:12  Sodium Latest Range: 135-145 mmol/L  139  Potassium Latest Range: 3.5-5.1 mmol/L  4.3  Chloride Latest Range: 96-112 mmol/L  106  CO2 Latest Range: 19-32 mmol/L  29  BUN Latest Range: 6-23 mg/dL  13  Creatinine Latest Range: 0.50-1.10 mg/dL  0.67  Calcium Latest Range: 8.4-10.5 mg/dL  9.5  GFR calc non Af Amer Latest Range: >90 mL/min  81 (L)  GFR calc Af Amer Latest Range: >90 mL/min  >90  Glucose Latest Range: 70-99 mg/dL  88  Anion gap Latest Range: 5-15   4 (L)  Alkaline Phosphatase Latest Range: 39-117 U/L  75  Albumin Latest Range: 3.5-5.2 g/dL  3.9  AST Latest Range: 0-37 U/L  21  ALT Latest Range: 0-35 U/L  19  Total Protein Latest Range: 6.0-8.3 g/dL  7.3  Total Bilirubin Latest Range: 0.3-1.2 mg/dL  0.6  WBC Latest Range: 4.0-10.5 K/uL  7.5  RBC Latest Range: 3.87-5.11 MIL/uL  4.53  Hemoglobin Latest Range: 12.0-15.0 g/dL  14.1  HCT Latest Range: 36.0-46.0 %  41.7  MCV Latest Range: 78.0-100.0 fL  92.1  MCH Latest Range: 26.0-34.0 pg  31.1  MCHC Latest Range: 30.0-36.0 g/dL  33.8  RDW Latest Range: 11.5-15.5 %  12.5  Platelets Latest Range: 150-400 K/uL  204  Neutrophils Relative % Latest Range: 43-77 %  65  Lymphocytes Relative Latest Range: 12-46 %  23  Monocytes Relative Latest Range: 3-12 %  9  Eosinophils Relative Latest Range: 0-5 %  2  Basophils Relative Latest Range: 0-1 %  1  NEUT# Latest Range: 1.7-7.7 K/uL  4.9  Lymphocytes Absolute Latest Range: 0.7-4.0 K/uL  1.7  Monocytes Absolute Latest Range: 0.1-1.0 K/uL   0.7  Eosinophils Absolute Latest Range: 0.0-0.7 K/uL  0.1  Basophils Absolute Latest Range: 0.0-0.1 K/uL  0.1  INR Latest Range: <1.50  2.2   CA 27.29 Latest Range: 0.0-38.6 U/mL  19.6    Thank you for choosing [place] at [facility] to provide your oncology and hematology care.  To afford each patient quality time with our provider, please arrive at least 15 minutes before your scheduled appointment time.    You need to re-schedule your appointment should you arrive 10 or more minutes late.  We strive to give you quality time with our providers, and arriving late affects you and other patients whose appointments are after yours.  Also, if you no show three or more times for appointments you may be dismissed from the clinic at the providers discretion.     Again, thank you for choosing [facility].  Our hope is that these requests will decrease the amount of time that you wait before being seen by our physicians.       _____________________________________________________________  Should you have questions after your visit to [facility], please contact our office at [phone] between the hours of 8:30 a.m. and 4:30 p.m.  Voicemails left after 4:30 p.m. will not be returned until the following business day.  For  prescription refill requests, have your pharmacy contact our office.

## 2014-06-29 NOTE — Assessment & Plan Note (Signed)
81 year old female with a history of stage III carcinoma of the left breast diagnosed back in 1987. Exam today is unremarkable. She has no evidence of recurrence. Her major complaints revolve around a bad right knee. She is on Coumadin for atrial fibrillation and finds that inconvenient at times. But she is otherwise active, her appetite is good, and she is doing fairly well. He has a mammogram scheduled for the 16th of this month. We will see her back again in one year, sooner if needed.

## 2014-07-03 ENCOUNTER — Other Ambulatory Visit: Payer: Self-pay | Admitting: Cardiovascular Disease

## 2014-07-04 ENCOUNTER — Other Ambulatory Visit (HOSPITAL_COMMUNITY): Payer: Self-pay | Admitting: Plastic Surgery

## 2014-07-04 DIAGNOSIS — R234 Changes in skin texture: Secondary | ICD-10-CM

## 2014-07-11 ENCOUNTER — Encounter (HOSPITAL_COMMUNITY): Payer: Self-pay

## 2014-07-12 ENCOUNTER — Encounter: Payer: Self-pay | Admitting: Internal Medicine

## 2014-07-12 ENCOUNTER — Ambulatory Visit (INDEPENDENT_AMBULATORY_CARE_PROVIDER_SITE_OTHER): Payer: 59 | Admitting: Internal Medicine

## 2014-07-12 VITALS — BP 128/72 | HR 84 | Temp 98.2°F | Resp 16 | Ht 64.5 in | Wt 153.0 lb

## 2014-07-12 DIAGNOSIS — N390 Urinary tract infection, site not specified: Secondary | ICD-10-CM

## 2014-07-12 DIAGNOSIS — R319 Hematuria, unspecified: Secondary | ICD-10-CM

## 2014-07-12 NOTE — Progress Notes (Signed)
Subjective:    Patient ID: Pamela Whitaker, female    DOB: [birth year], 81 y.o.   MRN: [id number]  HPI Very nice WW[facility] recently for routine quarterly lipid , preDM checkup and was dx/'d wit a grp B Strep UTI and treated w/o problems. Patient denies any current UT sx's and systems review is negative. Medication Sig  . carvedilol (COREG) 6.25 MG tablet TAKE 1 TABLET (6.25 MG TOTAL) BY MOUTH 2 (TWO) TIMES DAILY WITH A MEAL.  Marland Kitchen Cholecalciferol (VITAMIN D) 2000 UNITS tablet Take 2,000 Units by mouth 2 (two) times daily. Does not take regularly  . Cyanocobalamin (VITAMIN B 12 PO) Take by mouth daily. Takes every 2-3 days  . fluticasone (FLONASE) 50 MCG/ACT nasal spray Place 1 spray into both nostrils daily.  . Multiple Vitamin (MULTIVITAMIN) capsule Take 1 capsule by mouth daily. Women's Centrum  . warfarin (COUMADIN) 2.5 MG tablet TAKE AS DIRECTED BY COUMADIN CLINIC  . amoxicillin (AMOXIL) 500 MG capsule Take 1 capsule (500 mg total) by mouth 3 (three) times daily. completed   Allergies  Allergen Reactions  . Amiodarone     Stopped on her own  . Lipitor [Atorvastatin]     weakness  . Multaq [Dronedarone]     Stopped on her own   Past Medical History  Diagnosis Date  . Atrial fibrillation     treated with multaq x 6 mos in 2011....[name]=2 (Age; + CHF; no CVA; no DM2). Echo 05/24/10: EF 30% mild AS(mean 47mmgHg); mild LAE; mod LAE; PASP 35 Systolic CHF  . Cardiomyopathy     a. tachy mediated Myoview 06/06/10:  EF 31%; no scar or ischemia; EF up to 50% per echo November 2013  . Breast cancer      in 1987 with a left  mastectomy  . Osteoporosis     history of bone chips in the right knee,previous right fibular fracture, operated on by Dr. Veverly Fells  . RLS (restless legs syndrome)   . Lumbar stenosis   . Hypercholesteremia   . DJD (degenerative joint disease) of knee     right knee  . Falls   . Hypertension   . Vitamin D deficiency   . Diverticulosis    Past Surgical History  Procedure  Laterality Date  . Knee arthroscopy  2005  . Mastectomy, radical  1987    left breast  . Breast reconstruction  1988    left breast  . Wrist fracture surgery      left wrist  . Cardioversion  2012   Review of Systems Negative    Objective:   Physical Exam BP 128/72 mmHg  Pulse 84  Temp(Src) 98.2 F (36.8 C) (Temporal)  Resp 16  Ht 5' 4.5" (1.638 m)  Wt 153 lb (69.4 kg)  BMI 25.87 kg/m2  HEENT - Eac's patent. TM's Nl. EOM's full. PERRLA. NasoOroPharynx clear. Neck - supple. Nl Thyroid. Carotids 2+ & No bruits, nodes, JVD Chest - Clear equal BS w/o Rales, rhonchi, wheezes. Cor - Nl HS. RRR w/o sig MGR. PP 1(+). No edema. Abd - No palpable organomegaly, masses or tenderness. BS nl. MS- FROM w/o deformities. Muscle power, tone and bulk Nl. Gait Nl. Neuro - No obvious Cr N abnormalities. Sensory, motor and Cerebellar functions appear Nl w/o focal abnormalities. Psyche - Mental status normal & appropriate.     Assessment & Plan:   1. Urinary tract infection with hematuria, site unspecified  - Urine culture - Urinalysis, Routine w reflex microscopic

## 2014-07-13 ENCOUNTER — Ambulatory Visit (INDEPENDENT_AMBULATORY_CARE_PROVIDER_SITE_OTHER): Payer: Medicare Other | Admitting: *Deleted

## 2014-07-13 DIAGNOSIS — I482 Chronic atrial fibrillation, unspecified: Secondary | ICD-10-CM

## 2014-07-13 DIAGNOSIS — Z5181 Encounter for therapeutic drug level monitoring: Secondary | ICD-10-CM

## 2014-07-13 DIAGNOSIS — I4891 Unspecified atrial fibrillation: Secondary | ICD-10-CM

## 2014-07-13 LAB — URINALYSIS, ROUTINE W REFLEX MICROSCOPIC
BILIRUBIN URINE: NEGATIVE
Glucose, UA: NEGATIVE mg/dL
Hgb urine dipstick: NEGATIVE
Ketones, ur: NEGATIVE mg/dL
Leukocytes, UA: NEGATIVE
Nitrite: NEGATIVE
PROTEIN: NEGATIVE mg/dL
Specific Gravity, Urine: 1.02 (ref 1.005–1.030)
UROBILINOGEN UA: 1 mg/dL (ref 0.0–1.0)
pH: 6.5 (ref 5.0–8.0)

## 2014-07-13 LAB — URINE CULTURE: Colony Count: >=100000

## 2014-07-13 LAB — POCT INR: INR: 2.4

## 2014-07-18 ENCOUNTER — Ambulatory Visit (HOSPITAL_COMMUNITY)
Admission: RE | Admit: 2014-07-18 | Discharge: 2014-07-18 | Disposition: A | Discharge status: Home / Self Care | Payer: Medicare Other | Source: Ambulatory Visit | Attending: Plastic Surgery | Admitting: Plastic Surgery

## 2014-07-18 ENCOUNTER — Other Ambulatory Visit (HOSPITAL_COMMUNITY): Payer: Self-pay | Admitting: Plastic Surgery

## 2014-07-18 DIAGNOSIS — Z9882 Breast implant status: Secondary | ICD-10-CM | POA: Diagnosis not present

## 2014-07-18 DIAGNOSIS — R234 Changes in skin texture: Secondary | ICD-10-CM

## 2014-07-18 DIAGNOSIS — Z1231 Encounter for screening mammogram for malignant neoplasm of breast: Secondary | ICD-10-CM | POA: Diagnosis not present

## 2014-07-18 DIAGNOSIS — T8543XA Leakage of breast prosthesis and implant, initial encounter: Secondary | ICD-10-CM

## 2014-07-18 DIAGNOSIS — T8589XA Other specified complication of internal prosthetic devices, implants and grafts, not elsewhere classified, initial encounter: Secondary | ICD-10-CM | POA: Insufficient documentation

## 2014-08-24 ENCOUNTER — Ambulatory Visit (INDEPENDENT_AMBULATORY_CARE_PROVIDER_SITE_OTHER): Payer: Medicare Other | Admitting: *Deleted

## 2014-08-24 DIAGNOSIS — I4891 Unspecified atrial fibrillation: Secondary | ICD-10-CM | POA: Diagnosis not present

## 2014-08-24 DIAGNOSIS — I482 Chronic atrial fibrillation, unspecified: Secondary | ICD-10-CM

## 2014-08-24 DIAGNOSIS — Z5181 Encounter for therapeutic drug level monitoring: Secondary | ICD-10-CM

## 2014-08-24 LAB — POCT INR: INR: 2.7

## 2014-09-28 ENCOUNTER — Encounter: Payer: Self-pay | Admitting: Physician Assistant

## 2014-09-28 ENCOUNTER — Ambulatory Visit (INDEPENDENT_AMBULATORY_CARE_PROVIDER_SITE_OTHER): Payer: Medicare Other | Admitting: Physician Assistant

## 2014-09-28 VITALS — BP 128/80 | HR 68 | Temp 97.7°F | Resp 16 | Ht 64.5 in | Wt 155.0 lb

## 2014-09-28 DIAGNOSIS — Z0001 Encounter for general adult medical examination with abnormal findings: Secondary | ICD-10-CM

## 2014-09-28 DIAGNOSIS — R6889 Other general symptoms and signs: Secondary | ICD-10-CM

## 2014-09-28 DIAGNOSIS — I10 Essential (primary) hypertension: Secondary | ICD-10-CM

## 2014-09-28 DIAGNOSIS — I420 Dilated cardiomyopathy: Secondary | ICD-10-CM

## 2014-09-28 DIAGNOSIS — Z5181 Encounter for therapeutic drug level monitoring: Secondary | ICD-10-CM

## 2014-09-28 DIAGNOSIS — S30860A Insect bite (nonvenomous) of lower back and pelvis, initial encounter: Secondary | ICD-10-CM

## 2014-09-28 DIAGNOSIS — R7303 Prediabetes: Secondary | ICD-10-CM

## 2014-09-28 DIAGNOSIS — E785 Hyperlipidemia, unspecified: Secondary | ICD-10-CM

## 2014-09-28 DIAGNOSIS — Z9181 History of falling: Secondary | ICD-10-CM

## 2014-09-28 DIAGNOSIS — W57XXXA Bitten or stung by nonvenomous insect and other nonvenomous arthropods, initial encounter: Secondary | ICD-10-CM

## 2014-09-28 DIAGNOSIS — I482 Chronic atrial fibrillation, unspecified: Secondary | ICD-10-CM

## 2014-09-28 DIAGNOSIS — G2581 Restless legs syndrome: Secondary | ICD-10-CM

## 2014-09-28 DIAGNOSIS — E559 Vitamin D deficiency, unspecified: Secondary | ICD-10-CM

## 2014-09-28 DIAGNOSIS — R7309 Other abnormal glucose: Secondary | ICD-10-CM

## 2014-09-28 DIAGNOSIS — I5022 Chronic systolic (congestive) heart failure: Secondary | ICD-10-CM

## 2014-09-28 DIAGNOSIS — M1990 Unspecified osteoarthritis, unspecified site: Secondary | ICD-10-CM

## 2014-09-28 DIAGNOSIS — Z1331 Encounter for screening for depression: Secondary | ICD-10-CM

## 2014-09-28 DIAGNOSIS — C50912 Malignant neoplasm of unspecified site of left female breast: Secondary | ICD-10-CM

## 2014-09-28 DIAGNOSIS — K5730 Diverticulosis of large intestine without perforation or abscess without bleeding: Secondary | ICD-10-CM

## 2014-09-28 LAB — CBC WITH DIFFERENTIAL/PLATELET
Basophils Absolute: 0.1 10*3/uL (ref 0.0–0.1)
Basophils Relative: 1 % (ref 0–1)
EOS ABS: 0.2 10*3/uL (ref 0.0–0.7)
EOS PCT: 3 % (ref 0–5)
HEMATOCRIT: 42.3 % (ref 36.0–46.0)
HEMOGLOBIN: 14.1 g/dL (ref 12.0–15.0)
LYMPHS ABS: 1.5 10*3/uL (ref 0.7–4.0)
Lymphocytes Relative: 23 % (ref 12–46)
MCH: 30.1 pg (ref 26.0–34.0)
MCHC: 33.3 g/dL (ref 30.0–36.0)
MCV: 90.4 fL (ref 78.0–100.0)
MONOS PCT: 9 % (ref 3–12)
MPV: 9.6 fL (ref 8.6–12.4)
Monocytes Absolute: 0.6 10*3/uL (ref 0.1–1.0)
NEUTROS PCT: 64 % (ref 43–77)
Neutro Abs: 4.1 10*3/uL (ref 1.7–7.7)
Platelets: 128 10*3/uL — ABNORMAL LOW (ref 150–400)
RBC: 4.68 MIL/uL (ref 3.87–5.11)
RDW: 13.2 % (ref 11.5–15.5)
WBC: 6.4 10*3/uL (ref 4.0–10.5)

## 2014-09-28 LAB — HEPATIC FUNCTION PANEL
ALT: 13 U/L (ref 0–35)
AST: 17 U/L (ref 0–37)
Albumin: 4.2 g/dL (ref 3.5–5.2)
Alkaline Phosphatase: 74 U/L (ref 39–117)
BILIRUBIN DIRECT: 0.1 mg/dL (ref 0.0–0.3)
BILIRUBIN TOTAL: 0.6 mg/dL (ref 0.2–1.2)
Indirect Bilirubin: 0.5 mg/dL (ref 0.2–1.2)
Total Protein: 7.2 g/dL (ref 6.0–8.3)

## 2014-09-28 LAB — BASIC METABOLIC PANEL WITH GFR
BUN: 9 mg/dL (ref 6–23)
CHLORIDE: 103 meq/L (ref 96–112)
CO2: 27 meq/L (ref 19–32)
Calcium: 9.4 mg/dL (ref 8.4–10.5)
Creat: 0.76 mg/dL (ref 0.50–1.10)
GFR, EST NON AFRICAN AMERICAN: 74 mL/min
GFR, Est African American: 86 mL/min
GLUCOSE: 88 mg/dL (ref 70–99)
POTASSIUM: 4.4 meq/L (ref 3.5–5.3)
Sodium: 139 mEq/L (ref 135–145)

## 2014-09-28 MED ORDER — DOXYCYCLINE HYCLATE 100 MG PO TABS: 100.0000 mg | ORAL_TABLET | Freq: Two times a day (BID) | ORAL | Status: AC

## 2014-09-28 NOTE — Progress Notes (Signed)
MEDICARE ANNUAL WELLNESS VISIT AND FOLLOW UP  Assessment:   1. Screening for depression Negative screen  2. Routine general medical examination at a health care facility  3. At high risk for falls Declines PT at this time, continue follow up ortho and continue water therapy  4. Other abnormal glucose Discussed general issues about diabetes pathophysiology and management., Educational material distributed., Suggested low cholesterol diet., Encouraged aerobic exercise., Discussed foot care., Reminded to get yearly retinal exam. - Hemoglobin A1c - HM DIABETES FOOT EXAM  5. Chronic atrial fibrillation Continue follow up with coumadin clinc  6. CARDIOMYOPATHY Control HTN, chol, sugars, follow up cardio  7. Essential hypertension - CBC with Differential - BASIC METABOLIC PANEL WITH GFR - Hepatic function panel - TSH  8. Unspecified systolic heart failure Continue follow up with Dr. Johnsie Cancel, EF improved last echo, monitor weight  9. OSTEOARTHRITIS Continue follow up  10. HYPERLIPIDEMIA - Lipid panel  11. RESTLESS LEGS SYNDROME controlled  12. Spinal stenosis, lumbar region, with neurogenic claudication Discussed PT, she is doing water therapy which she states is helping, declines PT  13. Vitamin D deficiency - Vit D  25 hydroxy (rtn osteoporosis monitoring)  14. Encounter for long-term (current) use of other medications - Magnesium  15. Osteoporosis, unspecified Due 2017  16. Need for prophylactic vaccination and inoculation against influenza Will get PREVNAR next visit, we are out in the office  17. Tick bite Lone start tick, no HA, fever, chills, worsening myalgias.  Will not start on ABX since patient is on coumadin Will print out doxycycline for her to take IF she gets these symptoms and she will need to call coumadin clinic to adjust coumadin.   Future Appointments Date Time Provider [place]  10/05/2014 11:45 AM [facility]  [facility]  12/06/2014 3:30 PM Unk Pinto, MD [facility] None  06/29/2015 11:00 AM [facility] None  06/29/2015 11:30 AM Patrici Ranks, MD [facility] None    Plan:   During the course of the visit the patient was educated and counseled about appropriate screening and preventive services including:    Pneumococcal vaccine   Influenza vaccine  Td vaccine  Screening electrocardiogram  Screening mammography  Bone densitometry screening  Colorectal cancer screening  Diabetes screening  Glaucoma screening  Nutrition counseling   Advanced directives: given info/requested  Conditions/risks identified: BMI: Discussed weight loss, diet, and increase physical activity.  Increase physical activity: AHA recommends 150 minutes of physical activity a week.  Medications reviewed Diabetes is at goal, ACE/ARB therapy: No, Reason not on Ace Inhibitor/ARB therapy:  prediabetes Urinary Incontinence is an issue: discussed non pharmacology and pharmacology options.  Fall risk: high- discussed PT, home fall assessment, medications.    Subjective:   Pamela Whitaker is a 81 y.o. female who presents for Medicare Annual Wellness Visit and tick bite Date of last medicare wellness visit was 01/2014  Her blood pressure has been controlled at home, today their BP is BP: 128/80 mmHg She does workout and does water aerobics at the [facility], walks with cane. She denies chest pain, shortness of breath, dizziness.  She is not on cholesterol medication and denies myalgias. Her cholesterol is not at goal. The cholesterol last visit was:   Lab Results  Component Value Date   CHOL 242* 05/31/2014   HDL 54 05/31/2014   LDLCALC 148* 05/31/2014   TRIG 201* 05/31/2014   CHOLHDL 4.5 05/31/2014   She has been working on diet and exercise for prediabetes,  and denies polydipsia and polyuria. Last A1C in the office was:  Lab Results  Component Value Date   HGBA1C 5.6 05/31/2014    Patient is on Vitamin D supplement. Lab Results  Component Value Date   VD25OH 36 05/31/2014     She has chronic atrial fibrillation, last EF was 50-55% in 2013, she follows with Dr. Johnsie Cancel at coumadin clinic and is on couamdin and coreg, last cardioversion 2012. Normal lexiscan in 2012.  She has a history of left breast cancer in 1987 s/p mastectomy and has regular mammograms.  She is walking with a cane, she is seeing Dr. Maureen Ralphs for her knee and Dr. Milinda Pointer.  If she is walking long distances she will use a walker. She has had several falls in the past year without injury.  She complains of a tick bite on the center of her back, she thinks it could have been on her for 4 days, pulled it off Tuesday. It has been itching but has been getting better. Denies fatigue, rash, fever chills, worsening joint pain. She is going tot he beach with her daughter for mother's day and is contcerned.    Names of Other Physician/Practitioners you currently use: 1. [facility]- here for primary care 2.  Dr. Oneida Arenas, dentist, last visit March 2016 Patient Care Team: Unk Pinto, MD as PCP - General (Internal Medicine) Josue Hector, MD as Consulting Physician (Cardiology) Hayden Pedro, MD as Consulting Physician (Ophthalmology)- March 2016 Winfield Cunas., MD as Consulting Physician (Gastroenterology) Newt Minion, MD as Consulting Physician (Orthopedic Surgery) Danella Sensing, MD as Consulting Physician (Dermatology)  Medication Review Current Outpatient Prescriptions on File Prior to Visit  Medication Sig Dispense Refill  . carvedilol (COREG) 6.25 MG tablet TAKE 1 TABLET (6.25 MG TOTAL) BY MOUTH 2 (TWO) TIMES DAILY WITH A MEAL. 60 tablet 11  . Cholecalciferol (VITAMIN D) 2000 UNITS tablet Take 2,000 Units by mouth 2 (two) times daily. Does not take regularly    . Cyanocobalamin (VITAMIN B 12 PO) Take by mouth daily. Takes every 2-3 days    .  fluticasone (FLONASE) 50 MCG/ACT nasal spray Place 1 spray into both nostrils daily. 16 g 3  . Multiple Vitamin (MULTIVITAMIN) capsule Take 1 capsule by mouth daily. Women's Centrum    . warfarin (COUMADIN) 2.5 MG tablet TAKE AS DIRECTED BY COUMADIN CLINIC 35 tablet 3   No current facility-administered medications on file prior to visit.    Current Problems (verified) Patient Active Problem List   Diagnosis Date Noted  . Breast cancer 06/29/2014  . Diverticulosis   . Other abnormal glucose 08/18/2013  . Encounter for therapeutic drug monitoring 06/23/2013  . Hypertension   . Vitamin D deficiency   . Spinal stenosis, lumbar region, with neurogenic claudication 03/28/2013    Class: Chronic  . Gait difficulty 02/23/2013  . Unspecified systolic heart failure 03/50/0938  . CARDIOMYOPATHY 05/29/2010  . Atrial fibrillation 04/16/2009  . Elevated lipids 04/13/2009  . RESTLESS LEGS SYNDROME 04/13/2009  . OSTEOARTHRITIS 04/13/2009    Screening Tests Health Maintenance  Topic Date Due  . TETANUS/TDAP  05/26/2010  . COLONOSCOPY  05/26/2014  . INFLUENZA VACCINE  12/25/2014  . DEXA SCAN  Completed  . ZOSTAVAX  Completed  . PNA vac Low Risk Adult  Completed     Immunization History  Administered Date(s) Administered  . Influenza, High Dose Seasonal PF 02/20/2014  . Influenza-Unspecified 04/28/2013  . PPD Test 04/26/2013  .  Pneumococcal Conjugate-13 05/31/2014  . Pneumococcal Polysaccharide-23 05/26/2006  . Td 05/26/2000  . Zoster 05/26/2008    Preventative care: Last colonoscopy: 2006 due THIS YEAR Last mammogram: 06/2014 Last pap smear/pelvic exam: 2011  DEXA: 02/2014 Aorta scan normal 2012 Echo 08/2011 EF 50-55%, mild AR/MR Lexiscan 05/2010 CXR 2012  Prior vaccinations: TD or Tdap: 2002 DUE, wants next visit Influenza: 2015 Pneumococcal: 2008 Prevnar DUE out of in the office Shingles/Zostavax: 2010  Allergies Allergies  Allergen Reactions  . Amiodarone      Stopped on her own  . Lipitor [Atorvastatin]     weakness  . Multaq [Dronedarone]     Stopped on her own   Surgical history Past Surgical History  Procedure Laterality Date  . Knee arthroscopy  2005  . Mastectomy, radical  1987    left breast  . Breast reconstruction  1988    left breast  . Wrist fracture surgery      left wrist  . Cardioversion  2012   Family history Family History  Problem Relation Age of Onset  . Heart attack Father   . Aortic aneurysm Mother   . Cancer Sister     breast cancer  . Diabetes Sister   . Cancer Sister     stomach   Risk Factors: Osteoporosis/FallRisk: postmenopausal estrogen deficiency and dietary calcium and/or vitamin D deficiency In the past year have you fallen or had a near fall?:Yes History of fracture in the past year: no  Tobacco History  Substance Use Topics  . Smoking status: Never Smoker   . Smokeless tobacco: Never Used  . Alcohol Use: No   She does not smoke.  Patient is not a former smoker. Are there smokers in your home (other than you)?  No  Alcohol Current alcohol use: none  Caffeine Current caffeine use: coffee 1 /day  Exercise Current exercise: no regular exercise and stays busy  Nutrition/Diet Current diet: in general, a "healthy" diet    Cardiac risk factors: advanced age (older than 41 for men, 25 for women), diabetes mellitus, dyslipidemia, hypertension, obesity (BMI >= 30 kg/m2) and sedentary lifestyle.  Depression Screen (Note: if answer to either of the following is "Yes", a more complete depression screening is indicated)   Q1: Over the past two weeks, have you felt down, depressed or hopeless? No  Q2: Over the past two weeks, have you felt little interest or pleasure in doing things? No  Have you lost interest or pleasure in daily life? No  Do you often feel hopeless? No  Do you cry easily over simple problems? No  Activities of Daily Living In your present state of health, do you have any  difficulty performing the following activities?:  Driving? No Managing money?  No Feeding yourself? No Getting from bed to chair? No Climbing a flight of stairs? No Preparing food and eating?: No Bathing or showering? No Getting dressed: No Getting to the toilet? No Using the toilet:No Moving around from place to place: Yes   Are you sexually active?  No  Do you have more than one partner?  No  Vision Difficulties: No  Hearing Difficulties: Yes- better after cerumen impaction removal Do you often ask people to speak up or repeat themselves? Yes Do you experience ringing or noises in your ears? No Do you have difficulty understanding soft or whispered voices? Yes  Cognition  Do you feel that you have a problem with memory?No  Do you often misplace items? No  Do  you feel safe at home?  Yes  Advanced directives Does patient have a [place]? Yes Does patient have a Living Will? Yes   Objective:   Blood pressure 128/80, pulse 68, temperature 97.7 F (36.5 C), resp. rate 16, height 5' 4.5" (1.638 m), weight 155 lb (70.308 kg). Body mass index is 26.2 kg/(m^2).  General appearance: alert, no distress, WD/WN,  female Cognitive Testing  Alert? Yes  Normal Appearance?Yes  Oriented to person? Yes  Place? Yes   Time? Yes  Recall of three objects?  Yes  Can perform simple calculations? Yes  Displays appropriate judgment?Yes  Can read the correct time from a watch face?Yes  HEENT: normocephalic, sclerae anicteric, Left ear with cerumen impaction, removed, TMs pearly, nares patent, no discharge or erythema, pharynx normal Oral cavity: MMM, no lesions Neck: supple, no lymphadenopathy, no thyromegaly, no masses Heart: Irreg, irreg, normal S1, S2, 2/6 systolic murmur Lungs: CTA bilaterally, no wheezes, rhonchi, or rales Abdomen: +bs, soft, non tender, non distended, no masses, no hepatomegaly, no splenomegaly Musculoskeletal: nontender, no swelling, no  obvious deformity Extremities: no edema, no cyanosis, no clubbing Pulses: 2+ symmetric, upper and lower extremities, normal cap refill Neurological: alert, oriented x 3, CN2-12 intact, strength normal upper extremities and lower extremities, sensation normal throughout, DTRs 2+ throughout, no cerebellar signs, gait unsteady/atalgic with a cane Skin: right center back with erythematous indurated 4x3 mm area with healing scab, no other rash, lesions.  Psychiatric: normal affect, behavior normal, pleasant  Breast: defer Gyn: defer Rectal: defer  Medicare Attestation I have personally reviewed: The patient's medical and social history Their use of alcohol, tobacco or illicit drugs Their current medications and supplements The patient's functional ability including ADLs,fall risks, home safety risks, cognitive, and hearing and visual impairment Diet and physical activities Evidence for depression or mood disorders  The patient's weight, height, BMI, and visual acuity have been recorded in the chart.  I have made referrals, counseling, and provided education to the patient based on review of the above and I have provided the patient with a written personalized care plan for preventive services.     Vicie Mutters, PA-C   09/28/2014

## 2014-09-28 NOTE — Patient Instructions (Addendum)
[place]one star tick NOT deer tick ONLY take the doxycycline if you gets the symptoms below and you will need to call the coumadin clinic to adjust your coumadin because the antibiotic can increase your INR and cause you to bleed.  Can put over the counter hydrocortisone and heating pad on the area  Tick Bite Information Ticks are insects that attach themselves to the skin and draw blood for food. There are various types of ticks. Common types include wood ticks and deer ticks. Most ticks live in shrubs and grassy areas. Ticks can climb onto your body when you make contact with leaves or grass where the tick is waiting. The most common places on the body for ticks to attach themselves are the scalp, neck, armpits, waist, and groin. Most tick bites are harmless, but sometimes ticks carry germs that cause diseases. These germs can be spread to a person during the tick's feeding process. The chance of a disease spreading through a tick bite depends on:   The type of tick.  Time of year.   How long the tick is attached.   Geographic location.  HOW CAN YOU PREVENT TICK BITES? Take these steps to help prevent tick bites when you are outdoors:  Wear protective clothing. Long sleeves and long pants are best.   Wear white clothes so you can see ticks more easily.  Tuck your pant legs into your socks.   If walking on a trail, stay in the middle of the trail to avoid brushing against bushes.  Avoid walking through areas with long grass.  Put insect repellent on all exposed skin and along boot tops, pant legs, and sleeve cuffs.   Check clothing, hair, and skin repeatedly and before going inside.   Brush off any ticks that are not attached.  Take a shower or bath as soon as possible after being outdoors.  WHAT IS THE PROPER WAY TO REMOVE A TICK? Ticks should be removed as soon as possible to help prevent diseases caused by tick bites. 1. If latex gloves are available, put them on before  trying to remove a tick.  2. Using fine-point tweezers, grasp the tick as close to the skin as possible. You may also use curved forceps or a tick removal tool. Grasp the tick as close to its head as possible. Avoid grasping the tick on its body. 3. Pull gently with steady upward pressure until the tick lets go. Do not twist the tick or jerk it suddenly. This may break off the tick's head or mouth parts. 4. Do not squeeze or crush the tick's body. This could force disease-carrying fluids from the tick into your body.  5. After the tick is removed, wash the bite area and your hands with soap and water or other disinfectant such as alcohol. 6. Apply a small amount of antiseptic cream or ointment to the bite site.  7. Wash and disinfect any instruments that were used.  Do not try to remove a tick by applying a hot match, petroleum jelly, or fingernail polish to the tick. These methods do not work and may increase the chances of disease being spread from the tick bite.  WHEN SHOULD YOU SEEK MEDICAL CARE? Contact your health care provider if you are unable to remove a tick from your skin or if a part of the tick breaks off and is stuck in the skin.  After a tick bite, you need to be aware of signs and symptoms that could be  related to diseases spread by ticks. Contact your health care provider if you develop any of the following in the days or weeks after the tick bite:  Unexplained fever.  Rash. A circular rash that appears days or weeks after the tick bite may indicate the possibility of Lyme disease. The rash may resemble a target with a bull's-eye and may occur at a different part of your body than the tick bite.  Redness and swelling in the area of the tick bite.   Tender, swollen lymph glands.   Diarrhea.   Weight loss.   Cough.   Fatigue.   Muscle, joint, or bone pain.   Abdominal pain.   Headache.   Lethargy or a change in your level of consciousness.  Difficulty  walking or moving your legs.   Numbness in the legs.   Paralysis.  Shortness of breath.   Confusion.   Repeated vomiting.  Document Released: 05/09/2000 Document Revised: 03/02/2013 Document Reviewed: 10/20/2012 [facility] Patient Information 2015 [place], [place]. This information is not intended to replace advice given to you by your health care provider. Make sure you discuss any questions you have with your health care provider.  Preventive Care for Adults A healthy lifestyle and preventive care can promote health and wellness. Preventive health guidelines for women include the following key practices.  A routine yearly physical is a good way to check with your health care provider about your health and preventive screening. It is a chance to share any concerns and updates on your health and to receive a thorough exam.  Visit your dentist for a routine exam and preventive care every 6 months. Brush your teeth twice a day and floss once a day. Good oral hygiene prevents tooth decay and gum disease.  The frequency of eye exams is based on your age, health, family medical history, use of contact lenses, and other factors. Follow your health care provider's recommendations for frequency of eye exams.  Eat a healthy diet. Foods like vegetables, fruits, whole grains, low-fat dairy products, and lean protein foods contain the nutrients you need without too many calories. Decrease your intake of foods high in solid fats, added sugars, and salt. Eat the right amount of calories for you.Get information about a proper diet from your health care provider, if necessary.  Regular physical exercise is one of the most important things you can do for your health. Most adults should get at least 150 minutes of moderate-intensity exercise (any activity that increases your heart rate and causes you to sweat) each week. In addition, most adults need muscle-strengthening exercises on 2 or more days a  week.  Maintain a healthy weight. The body mass index (BMI) is a screening tool to identify possible weight problems. It provides an estimate of body fat based on height and weight. Your health care provider can find your BMI and can help you achieve or maintain a healthy weight.For adults 20 years and older:  A BMI below 18.5 is considered underweight.  A BMI of 18.5 to 24.9 is normal.  A BMI of 25 to 29.9 is considered overweight.  A BMI of 30 and above is considered obese.  Maintain normal blood lipids and cholesterol levels by exercising and minimizing your intake of saturated fat. Eat a balanced diet with plenty of fruit and vegetables. If your lipid or cholesterol levels are high, you are over 50, or you are at high risk for heart disease, you may need your cholesterol levels checked more  frequently.Ongoing high lipid and cholesterol levels should be treated with medicines if diet and exercise are not working.  If you smoke, find out from your health care provider how to quit. If you do not use tobacco, do not start.  Lung cancer screening is recommended for adults aged 57-80 years who are at high risk for developing lung cancer because of a history of smoking. A yearly low-dose CT scan of the lungs is recommended for people who have at least a 30-pack-year history of smoking and are a current smoker or have quit within the past 15 years. A pack year of smoking is smoking an average of 1 pack of cigarettes a day for 1 year (for example: 1 pack a day for 30 years or 2 packs a day for 15 years). Yearly screening should continue until the smoker has stopped smoking for at least 15 years. Yearly screening should be stopped for people who develop a health problem that would prevent them from having lung cancer treatment.  Avoid use of street drugs. Do not share needles with anyone. Ask for help if you need support or instructions about stopping the use of drugs.  High blood pressure causes  heart disease and increases the risk of stroke.  Ongoing high blood pressure should be treated with medicines if weight loss and exercise do not work.  If you are 57-62 years old, ask your health care provider if you should take aspirin to prevent strokes.  Diabetes screening involves taking a blood sample to check your fasting blood sugar level. This should be done once every 3 years, after age 63, if you are within normal weight and without risk factors for diabetes. Testing should be considered at a younger age or be carried out more frequently if you are overweight and have at least 1 risk factor for diabetes.  Breast cancer screening is essential preventive care for women. You should practice "breast self-awareness." This means understanding the normal appearance and feel of your breasts and may include breast self-examination. Any changes detected, no matter how small, should be reported to a health care provider. Women in their 28s and 30s should have a clinical breast exam (CBE) by a health care provider as part of a regular health exam every 1 to 3 years. After age 65, women should have a CBE every year. Starting at age 14, women should consider having a mammogram (breast X-ray test) every year. Women who have a family history of breast cancer should talk to their health care provider about genetic screening. Women at a high risk of breast cancer should talk to their health care providers about having an MRI and a mammogram every year.  Breast cancer gene (BRCA)-related cancer risk assessment is recommended for women who have family members with BRCA-related cancers. BRCA-related cancers include breast, ovarian, tubal, and peritoneal cancers. Having family members with these cancers may be associated with an increased risk for harmful changes (mutations) in the breast cancer genes BRCA1 and BRCA2. Results of the assessment will determine the need for genetic counseling and BRCA1 and BRCA2  testing.  Routine pelvic exams to screen for cancer are no longer recommended for nonpregnant women who are considered low risk for cancer of the pelvic organs (ovaries, uterus, and vagina) and who do not have symptoms. Ask your health care provider if a screening pelvic exam is right for you.  If you have had past treatment for cervical cancer or a condition that could lead to cancer, you  need Pap tests and screening for cancer for at least 20 years after your treatment. If Pap tests have been discontinued, your risk factors (such as having a new sexual partner) need to be reassessed to determine if screening should be resumed. Some women have medical problems that increase the chance of getting cervical cancer. In these cases, your health care provider may recommend more frequent screening and Pap tests.    Colorectal cancer can be detected and often prevented. Most routine colorectal cancer screening begins at the age of 59 years and continues through age 78 years. However, your health care provider may recommend screening at an earlier age if you have risk factors for colon cancer. On a yearly basis, your health care provider may provide home test kits to check for hidden blood in the stool. Use of a small camera at the end of a tube, to directly examine the colon (sigmoidoscopy or colonoscopy), can detect the earliest forms of colorectal cancer. Talk to your health care provider about this at age 14, when routine screening begins. Direct exam of the colon should be repeated every 5-10 years through age 31 years, unless early forms of pre-cancerous polyps or small growths are found.  Osteoporosis is a disease in which the bones lose minerals and strength with aging. This can result in serious bone fractures or breaks. The risk of osteoporosis can be identified using a bone density scan. Women ages 49 years and over and women at risk for fractures or osteoporosis should discuss screening with their  health care providers. Ask your health care provider whether you should take a calcium supplement or vitamin D to reduce the rate of osteoporosis.  Menopause can be associated with physical symptoms and risks. Hormone replacement therapy is available to decrease symptoms and risks. You should talk to your health care provider about whether hormone replacement therapy is right for you.  Use sunscreen. Apply sunscreen liberally and repeatedly throughout the day. You should seek shade when your shadow is shorter than you. Protect yourself by wearing long sleeves, pants, a wide-brimmed hat, and sunglasses year round, whenever you are outdoors.  Once a month, do a whole body skin exam, using a mirror to look at the skin on your back. Tell your health care provider of new moles, moles that have irregular borders, moles that are larger than a pencil eraser, or moles that have changed in shape or color.  Stay current with required vaccines (immunizations).  Influenza vaccine. All adults should be immunized every year.  Tetanus, diphtheria, and acellular pertussis (Td, Tdap) vaccine. Pregnant women should receive 1 dose of Tdap vaccine during each pregnancy. The dose should be obtained regardless of the length of time since the last dose. Immunization is preferred during the 27th-36th week of gestation. An adult who has not previously received Tdap or who does not know her vaccine status should receive 1 dose of Tdap. This initial dose should be followed by tetanus and diphtheria toxoids (Td) booster doses every 10 years. Adults with an unknown or incomplete history of completing a 3-dose immunization series with Td-containing vaccines should begin or complete a primary immunization series including a Tdap dose. Adults should receive a Td booster every 10 years.    Zoster vaccine. One dose is recommended for adults aged 88 years or older unless certain conditions are present.    Pneumococcal 13-valent  conjugate (PCV13) vaccine. When indicated, a person who is uncertain of her immunization history and has no record of  immunization should receive the PCV13 vaccine. An adult aged 56 years or older who has certain medical conditions and has not been previously immunized should receive 1 dose of PCV13 vaccine. This PCV13 should be followed with a dose of pneumococcal polysaccharide (PPSV23) vaccine. The PPSV23 vaccine dose should be obtained at least 8 weeks after the dose of PCV13 vaccine. An adult aged 48 years or older who has certain medical conditions and previously received 1 or more doses of PPSV23 vaccine should receive 1 dose of PCV13. The PCV13 vaccine dose should be obtained 1 or more years after the last PPSV23 vaccine dose.    Pneumococcal polysaccharide (PPSV23) vaccine. When PCV13 is also indicated, PCV13 should be obtained first. All adults aged 55 years and older should be immunized. An adult younger than age 51 years who has certain medical conditions should be immunized. Any person who resides in a nursing home or long-term care facility should be immunized. An adult smoker should be immunized. People with an immunocompromised condition and certain other conditions should receive both PCV13 and PPSV23 vaccines. People with human immunodeficiency virus (HIV) infection should be immunized as soon as possible after diagnosis. Immunization during chemotherapy or radiation therapy should be avoided. Routine use of PPSV23 vaccine is not recommended for American Indians, [place]atives, or people younger than 65 years unless there are medical conditions that require PPSV23 vaccine. When indicated, people who have unknown immunization and have no record of immunization should receive PPSV23 vaccine. One-time revaccination 5 years after the first dose of PPSV23 is recommended for people aged 19-64 years who have chronic kidney failure, nephrotic syndrome, asplenia, or immunocompromised conditions. People  who received 1-2 doses of PPSV23 before age 9 years should receive another dose of PPSV23 vaccine at age 29 years or later if at least 5 years have passed since the previous dose. Doses of PPSV23 are not needed for people immunized with PPSV23 at or after age 1 years.   Preventive Services / Frequency  Ages 4 years and over  Blood pressure check.  Lipid and cholesterol check.  Lung cancer screening. / Every year if you are aged 85-80 years and have a 30-pack-year history of smoking and currently smoke or have quit within the past 15 years. Yearly screening is stopped once you have quit smoking for at least 15 years or develop a health problem that would prevent you from having lung cancer treatment.  Clinical breast exam.** / Every year after age 6 years.  BRCA-related cancer risk assessment.** / For women who have family members with a BRCA-related cancer (breast, ovarian, tubal, or peritoneal cancers).  Mammogram.** / Every year beginning at age 27 years and continuing for as long as you are in good health. Consult with your health care provider.  Pap test.** / Every 3 years starting at age 68 years through age 83 or 57 years with 3 consecutive normal Pap tests. Testing can be stopped between 65 and 70 years with 3 consecutive normal Pap tests and no abnormal Pap or HPV tests in the past 10 years.  Fecal occult blood test (FOBT) of stool. / Every year beginning at age 66 years and continuing until age 52 years. You may not need to do this test if you get a colonoscopy every 10 years.  Flexible sigmoidoscopy or colonoscopy.** / Every 5 years for a flexible sigmoidoscopy or every 10 years for a colonoscopy beginning at age 22 years and continuing until age 62 years.  Hepatitis C blood  test.** / For all people born from [birth year] through 1963 and any individual with known risks for hepatitis C.  Osteoporosis screening.** / A one-time screening for women ages 62 years and over and women at  risk for fractures or osteoporosis.  Skin self-exam. / Monthly.  Influenza vaccine. / Every year.  Tetanus, diphtheria, and acellular pertussis (Tdap/Td) vaccine.** / 1 dose of Td every 10 years.  Zoster vaccine.** / 1 dose for adults aged 46 years or older.  Pneumococcal 13-valent conjugate (PCV13) vaccine.** / Consult your health care provider.  Pneumococcal polysaccharide (PPSV23) vaccine.** / 1 dose for all adults aged 19 years and older. Screening for abdominal aortic aneurysm (AAA)  by ultrasound is recommended for people who have history of high blood pressure or who are current or former smokers.

## 2014-10-10 ENCOUNTER — Ambulatory Visit (INDEPENDENT_AMBULATORY_CARE_PROVIDER_SITE_OTHER): Payer: Medicare Other | Admitting: *Deleted

## 2014-10-10 DIAGNOSIS — I482 Chronic atrial fibrillation, unspecified: Secondary | ICD-10-CM

## 2014-10-10 DIAGNOSIS — Z5181 Encounter for therapeutic drug level monitoring: Secondary | ICD-10-CM

## 2014-10-10 DIAGNOSIS — I4891 Unspecified atrial fibrillation: Secondary | ICD-10-CM | POA: Diagnosis not present

## 2014-10-10 LAB — POCT INR: INR: 2.6

## 2014-10-26 ENCOUNTER — Other Ambulatory Visit: Payer: Self-pay | Admitting: Cardiovascular Disease

## 2014-11-30 ENCOUNTER — Ambulatory Visit: Payer: Medicare Other | Attending: Orthopedic Surgery | Admitting: Physical Therapy

## 2014-11-30 DIAGNOSIS — R531 Weakness: Secondary | ICD-10-CM

## 2014-11-30 DIAGNOSIS — R293 Abnormal posture: Secondary | ICD-10-CM | POA: Diagnosis present

## 2014-11-30 DIAGNOSIS — W19XXXD Unspecified fall, subsequent encounter: Secondary | ICD-10-CM

## 2014-11-30 DIAGNOSIS — M25561 Pain in right knee: Secondary | ICD-10-CM | POA: Diagnosis present

## 2014-11-30 DIAGNOSIS — R269 Unspecified abnormalities of gait and mobility: Secondary | ICD-10-CM

## 2014-11-30 NOTE — Therapy (Signed)
[place], [place], [address] Phone: [phone]   Fax:  [phone]  Physical Therapy Evaluation  Patient Details  Name: Pamela Whitaker MRN: [id number] Date of Birth: 1933/05/11 Referring Provider:  Gaynelle Arabian, MD  Encounter Date: 11/30/2014      PT End of Session - 11/30/14 1450    Visit Number 1   Number of Visits 16   Date for PT Re-Evaluation 01/25/15   Authorization Type [employer]   Authorization Time Period 01-25-15   Authorization - Visit Number 1   Authorization - Number of Visits 16   PT Start Time 0962   PT Stop Time 1100   PT Time Calculation (min) 45 min   Activity Tolerance Patient tolerated treatment well   Behavior During Therapy [facility] for tasks assessed/performed      Past Medical History  Diagnosis Date  . Atrial fibrillation     treated with multaq x 6 mos in 2011....[name]=2 (Age; + CHF; no CVA; no DM2). Echo 05/24/10: EF 30% mild AS(mean 52mmgHg); mild LAE; mod LAE; PASP 35 Systolic CHF  . Cardiomyopathy     a. tachy mediated Myoview 06/06/10:  EF 31%; no scar or ischemia; EF up to 50% per echo November 2013  . Breast cancer      in 1987 with a left  mastectomy  . Osteoporosis     history of bone chips in the right knee,previous right fibular fracture, operated on by Dr. Veverly Fells  . RLS (restless legs syndrome)   . Lumbar stenosis   . Hypercholesteremia   . DJD (degenerative joint disease) of knee     right knee  . Falls   . Hypertension   . Vitamin D deficiency   . Diverticulosis     Past Surgical History  Procedure Laterality Date  . Knee arthroscopy  2005  . Mastectomy, radical  1987    left breast  . Breast reconstruction  1988    left breast  . Wrist fracture surgery      left wrist  . Cardioversion  2012    There were no vitals filed for this visit.  Visit Diagnosis:  Abnormality of gait  Pain in right knee  Generalized weakness  Falls,  subsequent encounter  Abnormal posture      Subjective Assessment - 11/30/14 1030    Subjective Pt enters clinic with Hurry cane with tripod base.  Pt had injection 2 months ago and she believes the injection made her pain worse.     Pertinent History A-fib   Limitations House hold activities;Walking;Standing  difficulty getting up   How long can you sit comfortably? 1 hour   How long can you stand comfortably? 15 min   How long can you walk comfortably? 15 min   Patient Stated Goals Pt wants to be able to walk without falling and hopefully without a cane.  Knee gives way.   Currently in Pain? Yes   Pain Score 7   but can go up to 10/10   Pain Location Knee   Pain Orientation Right   Pain Onset More than a month ago   Pain Frequency Intermittent   Aggravating Factors  getting up from chair, straightening, standing too long, shopping for groceries   Pain Relieving Factors ice and medication   Multiple Pain Sites Yes   Pain Score 8   Pain Location Foot  top of foot starting on Great toe   Pain Orientation Right  Pain Descriptors / Indicators Sharp   Pain Type Chronic pain   Pain Frequency Occasional            [facility] PT Assessment - 11/30/14 1030    Assessment   Medical Diagnosis posterior tibial tendinities of R LE , OA of R knee    Onset Date/Surgical Date 11/29/13  > 5 years for OA   Hand Dominance Right   Next MD Visit July 14   Prior Therapy none   Precautions   Precautions Fall   Precaution Comments Fallen 2 x with knee weakness   Required Braces or Orthoses Other Brace/Splint  ankle brace with bil stays for past 2 -3  years   Other Brace/Splint pt with foot orthotic in shoe and wearing brace for evertor weakness   Restrictions   Weight Bearing Restrictions No   Balance Screen   Has the patient fallen in the past 6 months Yes   How many times? 2  knee weakness on Right   Has the patient had a decrease in activity level because of a fear of falling?  Yes    Is the patient reluctant to leave their home because of a fear of falling?  Yes  difficult to grocery [place] to enter   Entrance Stairs-Number of Steps 2   [place] One level   Prior Function   Level of Independence Independent with community mobility with device   Vocation Retired   Associate Professor   Overall Cognitive Status Within Functional Limits for tasks assessed   Observation/Other Assessments   Observations Pt utilizes [employer] cane/Pt stands with Charcot looking foot with pesplanue severe on left.     Focus on Therapeutic Outcomes (FOTO)  intake 35% limtation 65%, 51 % predicted   Single Leg Stance   Comments Pt unable to stand  on R LE 0 sec,  Left for 20 seconds   Posture/Postural Control   Posture/Postural Control Postural limitations   Postural Limitations Rounded Shoulders;Forward head;Anterior pelvic tilt   Posture Comments wt bearing to Left   AROM   Right Knee Extension 12   Right Knee Flexion 124   Left Knee Extension 0   Left Knee Flexion 135   Right Ankle Dorsiflexion -10   Right Ankle Plantar Flexion 50   Right Ankle Inversion 15   Right Ankle Eversion 10   Left Ankle Dorsiflexion 0   Left Ankle Plantar Flexion 55   Left Ankle Inversion 25   Left Ankle Eversion 12   Strength   Overall Strength Deficits   Right Hip Flexion 3+/5   Right Hip Extension 3+/5   Right Hip ABduction 4-/5   Left Hip Flexion 4+/5   Left Hip Extension 4/5   Left Hip ABduction 4/5   Right Knee Flexion 4-/5   Right Knee Extension 3+/5   Left Knee Flexion 4/5   Left Knee Extension 4/5   Right Ankle Dorsiflexion 3+/5   Right Ankle Plantar Flexion 2+/5   Right Ankle Inversion 3-/5   Right Ankle Eversion 2/5   Left Ankle Dorsiflexion 4/5   Left Ankle Plantar Flexion 4/5   Left Ankle Inversion 4/5   Left Ankle Eversion 4/5   Flexibility    Hamstrings left hamstring tightness 60 degrees   Palpation   Patella mobility decreased lateral to medial    Palpation comment Pain over  R fibula distal and proximal ends. R fibular head more ant than on Left   Transfers   Five time sit to stand comments  Pt compensates by blocking knees on chair and excessive use of arms   Ambulation/Gait   Ambulation/Gait Yes   Assistive device Other (Comment)  hurry cane and sometimes uses 2 canes.  has a walker too.   Gait Comments Pt unsafe and holds onto walls and cane to ambulate. Pt instructed to use walker for safety and reduction of falls                   [facility] Adult PT Treatment/Exercise - 11/30/14 1030    Ambulation/Gait   Ambulation Distance (Feet) 100 Feet   Gait Pattern Narrow base of support;Poor foot clearance - right;Decreased hip/knee flexion - right;Decreased stride length   Gait velocity 1.33 ft/sec                PT Education - 11/30/14 1058    Education provided Yes   Education Details Pt educated on findings of evaluation, initial HEP for LE weakness and initial falls prevention by utilizing walker full time   Person(s) Educated Patient   Methods Explanation;Demonstration;Verbal cues;Handout   Comprehension Verbalized understanding;Returned demonstration          PT Short Term Goals - 11/30/14 1503    PT SHORT TERM GOAL #1   Title "Independent with initial HEP   Time 4   Period Weeks   Status New   PT SHORT TERM GOAL #2   Title "Report pain decrease from 8/10 to 5/10.   Time 4   Period Weeks   Status New   PT SHORT TERM GOAL #3   Title Pt will be able to use walker and improve gait velocity to 2.56ft/sec at minimum   Time 4   Period Weeks   Status New   PT SHORT TERM GOAL #4   Title Pt will be able to improve sit to stand in order to perform 5 x without use of UE   PT SHORT TERM GOAL #5   Title Pt will perform BERG and set long term goal for reduction of fall risk   Time 4   Period Weeks    Status New           PT Long Term Goals - 11/30/14 1507    PT LONG TERM GOAL #1   Title "Pt will be independent with advanced HEP.    Time 8   Period Weeks   Status New   PT LONG TERM GOAL #2   Title "Pain will decrease pain in knee  to 3/10 or less  with all functional activities   Time 8   Period Weeks   Status New   PT LONG TERM GOAL #3   Title "FOTO will improve from   65 % limitation to 51 %    indicating improved functional mobility.    Time 8   Period Weeks   Status New   PT LONG TERM GOAL #4   Title Pt will be able to tolerate standing with assistive device with pain 3/10 or less in order to shop for groceries   Time 8   Period Weeks   Status New   PT LONG TERM GOAL #5   Title Pt will be able to single limb stance on left leg for 30 seconds with shoe orthotic and with or without brace  to improve gait and reduce fall  risk   Time 8   Period Weeks   Status New               Plan - 12-03-14 1451    Clinical Impression Statement 81 yo pt presents to clinic using " Hurry 3 pronged cane with shuffling gait and holding onto walls/cruising with wt shift to left.  Pt gait velocity is 1.33 ft /sec close to household ambulator.  Pt uses compensatory motion in order to come to standing from sitting.   Pt with core/LE weakness and at high risk for falls.  Pt lives alone and would like to gain strength in order to continue living independently.  Pt  has limted AROM of Left knee and would benefit from skilled PT to address, Left peroneal weakness, knee weakness and core weakness which contribute to decreased function of gait.   Pt  has knee pain 7/10 and anterior foot pain 8/10 which also contribute  to decreased mobilty and balance.   Pt will benefit from skilled  PT to address these deficits with strength, , wakling, negotiating stairs and sit to stand transfers.      Pt will benefit from skilled therapeutic intervention in order to improve on the following deficits  Abnormal gait;Decreased activity tolerance;Decreased balance;Decreased mobility;Decreased knowledge of use of DME;Decreased coordination;Decreased range of motion;Decreased strength;Difficulty walking;Impaired flexibility;Postural dysfunction;Improper body mechanics;Pain   Rehab Potential Good   PT Frequency 2x / week   PT Duration 8 weeks   PT Treatment/Interventions ADLs/Self Care Home Management;Cryotherapy;Electrical Stimulation;Stair training;Gait training;DME Instruction;Ultrasound;Moist Heat;Iontophoresis 4mg /ml Dexamethasone;Functional mobility training;Therapeutic exercise;Therapeutic activities;Balance training;Neuromuscular re-education;Manual techniques;Patient/family education;Dry needling;Splinting;Taping;Vestibular   PT Next Visit Plan BERG balance test, balance activities and core strength.  ankle strength with T band   PT Home Exercise Plan SLR and quad set, use of walker full time   Consulted and Agree with Plan of Care Patient          G-Codes - 03-Dec-2014 1030    Functional Assessment Tool Used FOTO   Functional Limitation Mobility: Walking and moving around   Mobility: Walking and Moving Around Current Status [phone]) At least 60 percent but less than 80 percent impaired, limited or restricted   Mobility: Walking and Moving Around Goal Status [phone]) At least 40 percent but less than 60 percent impaired, limited or restricted       Problem List Patient Active Problem List   Diagnosis Date Noted  . Breast cancer 06/29/2014  . Diverticulosis   . Prediabetes 08/18/2013  . Encounter for therapeutic drug monitoring 06/23/2013  . Hypertension   . Vitamin D deficiency   . Spinal stenosis, lumbar region, with neurogenic claudication 03/28/2013    Class: Chronic  . At high risk for falls 02/23/2013  . Systolic heart failure 38/75/6433  . Congestive dilated cardiomyopathy 05/29/2010  . Atrial fibrillation 04/16/2009  . Elevated lipids 04/13/2009  . RESTLESS LEGS SYNDROME  04/13/2009  . Osteoarthritis 04/13/2009   Voncille Lo, PT 2014-12-03 3:20 PM Phone: [phone] Fax: [phone]  By signing I understand that I am ordering/authorizing the use of Iontophoresis using 4 mg/mL of dexamethasone as a component of this plan of care. [place], [place], [address] Phone: [phone]   Fax:  [phone]

## 2014-11-30 NOTE — Patient Instructions (Addendum)
Start using your walker everywhere to walk more normally rather than shuffling.  Keep using your ankle brace for stability.  Quad Set      LIe down with towel under R knee and push patella into towel for 5 sec .  Do 30.  And hold for 5 sec.  Watch TV and do during commercials   Strengthening: Straight Leg Raise (Phase 1)   Tighten muscles on front of right thigh, then lift leg _6___ inches from surface, keeping knee straight Repeat __10__ times per set. Do __3__ sets per session. Do _1-2___ sessions per day.  Voncille Lo, PT 11/30/2014 11:02 AM Phone: [phone] Fax: [phone]

## 2014-11-30 NOTE — Therapy (Deleted)
[place], [place], [address] Phone: [phone]   Fax:  [phone]  Patient Details  Name: GIFT RUECKERT MRN: [id number] Date of Birth: 02-03-1933 Referring Provider:  Gaynelle Arabian, MD  Encounter Date: 11/30/2014   Voncille Lo, PT 11/30/2014 3:12 PM Phone: [phone] Fax: [place], [place], [address] Phone: [phone]   Fax:  [phone]

## 2014-12-06 ENCOUNTER — Ambulatory Visit (INDEPENDENT_AMBULATORY_CARE_PROVIDER_SITE_OTHER): Payer: Medicare Other | Admitting: Physician Assistant

## 2014-12-06 ENCOUNTER — Encounter: Payer: Self-pay | Admitting: Physician Assistant

## 2014-12-06 VITALS — BP 110/78 | HR 72 | Temp 97.7°F | Resp 16 | Wt 151.0 lb

## 2014-12-06 DIAGNOSIS — Z5181 Encounter for therapeutic drug level monitoring: Secondary | ICD-10-CM

## 2014-12-06 DIAGNOSIS — Z0000 Encounter for general adult medical examination without abnormal findings: Secondary | ICD-10-CM | POA: Insufficient documentation

## 2014-12-06 DIAGNOSIS — R7309 Other abnormal glucose: Secondary | ICD-10-CM

## 2014-12-06 DIAGNOSIS — E559 Vitamin D deficiency, unspecified: Secondary | ICD-10-CM

## 2014-12-06 DIAGNOSIS — I482 Chronic atrial fibrillation, unspecified: Secondary | ICD-10-CM

## 2014-12-06 DIAGNOSIS — R7303 Prediabetes: Secondary | ICD-10-CM

## 2014-12-06 DIAGNOSIS — I10 Essential (primary) hypertension: Secondary | ICD-10-CM

## 2014-12-06 DIAGNOSIS — Z23 Encounter for immunization: Secondary | ICD-10-CM

## 2014-12-06 DIAGNOSIS — I5022 Chronic systolic (congestive) heart failure: Secondary | ICD-10-CM

## 2014-12-06 DIAGNOSIS — M25561 Pain in right knee: Secondary | ICD-10-CM

## 2014-12-06 DIAGNOSIS — R350 Frequency of micturition: Secondary | ICD-10-CM

## 2014-12-06 DIAGNOSIS — E785 Hyperlipidemia, unspecified: Secondary | ICD-10-CM

## 2014-12-06 LAB — CBC WITH DIFFERENTIAL/PLATELET
BASOS ABS: 0.1 10*3/uL (ref 0.0–0.1)
Basophils Relative: 1 % (ref 0–1)
Eosinophils Absolute: 0.2 10*3/uL (ref 0.0–0.7)
Eosinophils Relative: 2 % (ref 0–5)
HCT: 41 % (ref 36.0–46.0)
Hemoglobin: 13.7 g/dL (ref 12.0–15.0)
LYMPHS PCT: 27 % (ref 12–46)
Lymphs Abs: 2.1 10*3/uL (ref 0.7–4.0)
MCH: 30.1 pg (ref 26.0–34.0)
MCHC: 33.4 g/dL (ref 30.0–36.0)
MCV: 90.1 fL (ref 78.0–100.0)
MONO ABS: 0.6 10*3/uL (ref 0.1–1.0)
MONOS PCT: 8 % (ref 3–12)
MPV: 9 fL (ref 8.6–12.4)
NEUTROS PCT: 62 % (ref 43–77)
Neutro Abs: 4.7 10*3/uL (ref 1.7–7.7)
PLATELETS: 153 10*3/uL (ref 150–400)
RBC: 4.55 MIL/uL (ref 3.87–5.11)
RDW: 13.4 % (ref 11.5–15.5)
WBC: 7.6 10*3/uL (ref 4.0–10.5)

## 2014-12-06 NOTE — Progress Notes (Signed)
Assessment and Plan:  1. Essential hypertension - continue medications, DASH diet, exercise and monitor at home. Call if greater than 130/80.  - CBC with Differential/Platelet - BASIC METABOLIC PANEL WITH GFR - Hepatic function panel - TSH  2. Prediabetes Discussed general issues about diabetes pathophysiology and management., Educational material distributed., Suggested low cholesterol diet., Encouraged aerobic exercise., Discussed foot care., Reminded to get yearly retinal exam. - Hemoglobin A1c - Insulin, fasting  3. Elevated lipids -continue medications, check lipids, decrease fatty foods, increase activity.  - Lipid panel  5.  Urinary frequency - Urinalysis, Routine w reflex microscopic (not at [facility]) - Urine culture  4. Chronic atrial fibrillation Controlled with coreg, may need to get off coumadin  6. Vitamin D deficiency Check DEXA next OV - Vit D  25 hydroxy (rtn osteoporosis monitoring)  7. Encounter for therapeutic drug monitoring - Magnesium  8. Need for prophylactic vaccination with tetanus-diphtheria (TD) - DT Vaccine greater than 7yo IM  9. Pain in joint, lower leg, right Check labs, and follow up with Dr. Ron Agee - [place]te Alcide. Blt. IgG & IgM w/bands - Rocky mtn spotted fvr abs pnl(IgG+IgM)  Future Appointments Date Time Provider [place]  12/12/2014 4:30 PM Sofie Rower, PTA [facility]  12/19/2014 10:15 AM Dorothea Ogle, PT [facility]  12/26/2014 10:15 AM Pearson Forster, PT [facility]  12/28/2014 9:30 AM Sofie Rower, PTA [facility]  01/02/2015 10:15 AM Sofie Rower, PTA [facility]  01/04/2015 10:15 AM Sofie Rower, PTA [facility]  06/29/2015 11:00 AM [facility] None  06/29/2015 11:30 AM Patrici Ranks, MD [facility] None    Continue diet and meds as discussed. Further disposition pending results of labs. Over 30 minutes of exam, counseling, chart review, and critical decision making was  performed  HPI 81 y.o. female  presents for 3 month follow up on hypertension, cholesterol, prediabetes, and vitamin D deficiency.   Her blood pressure has been controlled at home, today their BP is BP: 110/78 mmHg  She does workout, does water aerobics at the [facility], walks with a cane. She denies chest pain, shortness of breath, dizziness. She has chronic afib with last EF 50-55%, follows with Dr. Johnsie Cancel, last cardioversion 2012 and normal lexiscan 2012, is on coreg and coumadin.   She is not on cholesterol medication and denies myalgias. Her cholesterol is not at goal. The cholesterol last visit was:   Lab Results  Component Value Date   CHOL 242* 05/31/2014   HDL 54 05/31/2014   LDLCALC 148* 05/31/2014   TRIG 201* 05/31/2014   CHOLHDL 4.5 05/31/2014    She has been working on diet and exercise for prediabetes, and denies polydipsia, polyuria and visual disturbances. Last A1C in the office was:  Lab Results  Component Value Date   HGBA1C 5.6 05/31/2014   Patient is on Vitamin D supplement.   Lab Results  Component Value Date   VD25OH 36 05/31/2014      She is Due for there Tetanus and prevnar, we are still out of the prevnar will get next time.  She had a tick bite 2 months ago and has been having fatigue and some joint pain. She is high fall risk, follows with Dr. Maureen Ralphs and Dr. Milinda Pointer, sees Dr. Maureen Ralphs tomorrow. Had an injection in her knee, felt good for 2-3 days and then it was worse and has been that way since at night. She has not been sleeping because of it.  Current Medications:  Current Outpatient Prescriptions on File Prior to Visit  Medication Sig Dispense Refill  . carvedilol (COREG) 6.25 MG tablet TAKE 1 TABLET (6.25 MG TOTAL) BY MOUTH 2 (TWO) TIMES DAILY WITH A MEAL. 60 tablet 11  . Cholecalciferol (VITAMIN D) 2000 UNITS tablet Take 2,000 Units by mouth 2 (two) times daily. Does not take regularly    . Cyanocobalamin (VITAMIN B 12 PO) Take by mouth daily. Takes every  2-3 days    . fluticasone (FLONASE) 50 MCG/ACT nasal spray Place 1 spray into both nostrils daily. 16 g 3  . Multiple Vitamin (MULTIVITAMIN) capsule Take 1 capsule by mouth daily. Women's Centrum    . warfarin (COUMADIN) 2.5 MG tablet TAKE AS DIRECTED BY COUMADIN CLINIC 35 tablet 3   No current facility-administered medications on file prior to visit.   Medical History:  Past Medical History  Diagnosis Date  . Atrial fibrillation     treated with multaq x 6 mos in 2011....[name]=2 (Age; + CHF; no CVA; no DM2). Echo 05/24/10: EF 30% mild AS(mean 29mmgHg); mild LAE; mod LAE; PASP 35 Systolic CHF  . Cardiomyopathy     a. tachy mediated Myoview 06/06/10:  EF 31%; no scar or ischemia; EF up to 50% per echo November 2013  . Breast cancer      in 1987 with a left  mastectomy  . Osteoporosis     history of bone chips in the right knee,previous right fibular fracture, operated on by Dr. Veverly Fells  . RLS (restless legs syndrome)   . Lumbar stenosis   . Hypercholesteremia   . DJD (degenerative joint disease) of knee     right knee  . Falls   . Hypertension   . Vitamin D deficiency   . Diverticulosis    Allergies:  Allergies  Allergen Reactions  . Amiodarone     Stopped on her own  . Lipitor [Atorvastatin]     weakness  . Multaq [Dronedarone]     Stopped on her own     Review of Systems:  Review of Systems  Constitutional: Positive for malaise/fatigue. Negative for fever, chills, weight loss and diaphoresis.  HENT: Negative.   Respiratory: Negative.   Cardiovascular: Negative.   Gastrointestinal: Negative.   Genitourinary: Negative.   Musculoskeletal: Positive for myalgias, back pain and joint pain. Negative for falls and neck pain.  Skin: Negative.   Neurological: Negative.  Negative for weakness.  Psychiatric/Behavioral: Negative for depression, suicidal ideas, hallucinations, memory loss and substance abuse. The patient is nervous/anxious. The patient does not have insomnia.      Family history- Review and unchanged Social history- Review and unchanged Physical Exam: BP 110/78 mmHg  Pulse 72  Temp(Src) 97.7 F (36.5 C)  Resp 16  Wt 151 lb (68.493 kg) Wt Readings from Last 3 Encounters:  12/06/14 151 lb (68.493 kg)  09/28/14 155 lb (70.308 kg)  07/12/14 153 lb (69.4 kg)   General appearance: alert, no distress, WD/WN, female HEENT: normocephalic, sclerae anicteric, Left ear with cerumen impaction, removed, TMs pearly, nares patent, no discharge or erythema, pharynx normal Oral cavity: MMM, no lesions Neck: supple, no lymphadenopathy, no thyromegaly, no masses Heart: Irreg, irreg, normal S1, S2, 2/6 systolic murmur Lungs: CTA bilaterally, no wheezes, rhonchi, or rales Abdomen: +bs, soft, non tender, non distended, no masses, no hepatomegaly, no splenomegaly Musculoskeletal: nontender, no swelling, no obvious deformity Extremities: no edema, no cyanosis, no clubbing Pulses: 2+ symmetric, upper and lower extremities, normal cap refill Neurological: alert,  oriented x 3, CN2-12 intact, strength normal upper extremities and lower extremities, sensation normal throughout, DTRs 2+ throughout, no cerebellar signs, gait unsteady/atalgic with a cane Psychiatric: normal affect, behavior normal, pleasant     Vicie Mutters, PA-C 3:58 PM [facility]

## 2014-12-06 NOTE — Patient Instructions (Addendum)
Tumeric with black pepper extract is a great natural antiinflammatory that helps with arthritis and aches and pain. Can get from [employer] or any health food store. Need to take at least 800mg  twice a day with food.   Can take benadryl at night for sleep and allergies for 1-2 weeks.   Can use the flector patch tonight at 8-10 pm to help tonight until you see Dr. Maureen Ralphs tomorrow, take off 12 hours later   Vitamin D goal is between 60-80  Please make sure that you are taking your Vitamin D as directed.   It is very important as a natural anti-inflammatory   helping hair, skin, and nails, as well as reducing stroke and heart attack risk.   It helps your bones and helps with mood.  It also decreases numerous cancer risks so please take it as directed.   Low Vit D is associated with a 200-300% higher risk for CANCER   and 200-300% higher risk for HEART   ATTACK  &  STROKE.    .....................................Marland Kitchen  It is also associated with higher death rate at younger ages,   autoimmune diseases like Rheumatoid arthritis, Lupus, Multiple Sclerosis.     Also many other serious conditions, like depression, Alzheimer's  Dementia, infertility, muscle aches, fatigue, fibromyalgia - just to name a few.  +++++++++++++++++++

## 2014-12-07 LAB — LIPID PANEL
CHOL/HDL RATIO: 7 ratio
Cholesterol: 252 mg/dL — ABNORMAL HIGH (ref 0–200)
HDL: 36 mg/dL — AB (ref >=46)
Triglycerides: 477 mg/dL — ABNORMAL HIGH (ref <150)

## 2014-12-07 LAB — INSULIN, FASTING: Insulin fasting, serum: 12.5 u[IU]/mL (ref 2.0–19.6)

## 2014-12-07 LAB — VITAMIN D 25 HYDROXY (VIT D DEFICIENCY, FRACTURES): Vit D, 25-Hydroxy: 29 ng/mL — ABNORMAL LOW (ref 30–100)

## 2014-12-07 LAB — BASIC METABOLIC PANEL WITH GFR
BUN: 16 mg/dL (ref 6–23)
CALCIUM: 9.7 mg/dL (ref 8.4–10.5)
CO2: 26 mEq/L (ref 19–32)
Chloride: 105 mEq/L (ref 96–112)
Creat: 0.76 mg/dL (ref 0.50–1.10)
GFR, EST NON AFRICAN AMERICAN: 74 mL/min
GFR, Est African American: 86 mL/min
GLUCOSE: 85 mg/dL (ref 70–99)
Potassium: 4.1 mEq/L (ref 3.5–5.3)
Sodium: 141 mEq/L (ref 135–145)

## 2014-12-07 LAB — URINALYSIS, MICROSCOPIC ONLY
Bacteria, UA: NONE SEEN
CASTS: NONE SEEN
Crystals: NONE SEEN
Squamous Epithelial / LPF: NONE SEEN

## 2014-12-07 LAB — HEPATIC FUNCTION PANEL
ALK PHOS: 71 U/L (ref 39–117)
ALT: 12 U/L (ref 0–35)
AST: 17 U/L (ref 0–37)
Albumin: 3.9 g/dL (ref 3.5–5.2)
Bilirubin, Direct: 0.1 mg/dL (ref 0.0–0.3)
Indirect Bilirubin: 0.3 mg/dL (ref 0.2–1.2)
Total Bilirubin: 0.4 mg/dL (ref 0.2–1.2)
Total Protein: 6.8 g/dL (ref 6.0–8.3)

## 2014-12-07 LAB — URINALYSIS, ROUTINE W REFLEX MICROSCOPIC
Bilirubin Urine: NEGATIVE
Glucose, UA: NEGATIVE mg/dL
Hgb urine dipstick: NEGATIVE
KETONES UR: NEGATIVE mg/dL
NITRITE: NEGATIVE
Protein, ur: NEGATIVE mg/dL
SPECIFIC GRAVITY, URINE: 1.027 (ref 1.005–1.030)
UROBILINOGEN UA: 0.2 mg/dL (ref 0.0–1.0)
pH: 5.5 (ref 5.0–8.0)

## 2014-12-07 LAB — HEMOGLOBIN A1C
Hgb A1c MFr Bld: 5.6 % (ref <5.7)
Mean Plasma Glucose: 114 mg/dL (ref <117)

## 2014-12-07 LAB — MAGNESIUM: Magnesium: 2 mg/dL (ref 1.5–2.5)

## 2014-12-07 LAB — TSH: TSH: 2.47 u[IU]/mL (ref 0.350–4.500)

## 2014-12-08 LAB — LYME ABY, WSTRN BLT IGG & IGM W/BANDS
B BURGDORFERI IGM ABS (IB): NEGATIVE
B burgdorferi IgG Abs (IB): NEGATIVE
LYME DISEASE 45 KD IGG: NONREACTIVE
Lyme Disease 18 kD IgG: REACTIVE — AB
Lyme Disease 23 kD IgG: REACTIVE — AB
Lyme Disease 23 kD IgM: NONREACTIVE
Lyme Disease 28 kD IgG: NONREACTIVE
Lyme Disease 30 kD IgG: NONREACTIVE
Lyme Disease 39 kD IgG: REACTIVE — AB
Lyme Disease 39 kD IgM: NONREACTIVE
Lyme Disease 41 kD IgG: NONREACTIVE
Lyme Disease 41 kD IgM: REACTIVE — AB
Lyme Disease 58 kD IgG: NONREACTIVE
Lyme Disease 66 kD IgG: NONREACTIVE
Lyme Disease 93 kD IgG: NONREACTIVE

## 2014-12-08 LAB — URINE CULTURE

## 2014-12-08 LAB — ROCKY MTN SPOTTED FVR ABS PNL(IGG+IGM)
RMSF IGG: 3.25 IV — AB
RMSF IgM: 0.25 IV

## 2014-12-08 MED ORDER — AMOXICILLIN 500 MG PO TABS: 500.0000 mg | ORAL_TABLET | Freq: Two times a day (BID) | ORAL | Status: AC

## 2014-12-08 NOTE — Addendum Note (Signed)
Addended by: Vicie Mutters R on: 12/08/2014 08:30 AM   Modules accepted: Orders, SmartSet

## 2014-12-09 MED ORDER — DOXYCYCLINE HYCLATE 100 MG PO CAPS: ORAL_CAPSULE | ORAL | Status: DC

## 2014-12-09 NOTE — Addendum Note (Signed)
Addended by: Vicie Mutters R on: 12/09/2014 04:19 PM   Modules accepted: Orders

## 2014-12-12 ENCOUNTER — Ambulatory Visit: Payer: Medicare Other | Admitting: Physical Therapy

## 2014-12-12 DIAGNOSIS — M25561 Pain in right knee: Secondary | ICD-10-CM

## 2014-12-12 DIAGNOSIS — R269 Unspecified abnormalities of gait and mobility: Secondary | ICD-10-CM | POA: Diagnosis not present

## 2014-12-12 DIAGNOSIS — W19XXXD Unspecified fall, subsequent encounter: Secondary | ICD-10-CM

## 2014-12-12 NOTE — Therapy (Signed)
[place], [place], [address] Phone: [phone]   Fax:  [phone]  Physical Therapy Treatment  Patient Details  Name: Pamela Whitaker MRN: [id number] Date of Birth: 1933/05/10 Referring Provider:  Unk Pinto, MD  Encounter Date: 12/12/2014      PT End of Session - 12/12/14 1751    Visit Number 2   Number of Visits 16   Date for PT Re-Evaluation 01/25/15   PT Start Time 1630   PT Stop Time 1725   PT Time Calculation (min) 55 min   Activity Tolerance Patient tolerated treatment well   Behavior During Therapy [facility] for tasks assessed/performed      Past Medical History  Diagnosis Date  . Atrial fibrillation     treated with multaq x 6 mos in 2011....[name]=2 (Age; + CHF; no CVA; no DM2). Echo 05/24/10: EF 30% mild AS(mean 37mmgHg); mild LAE; mod LAE; PASP 35 Systolic CHF  . Cardiomyopathy     a. tachy mediated Myoview 06/06/10:  EF 31%; no scar or ischemia; EF up to 50% per echo November 2013  . Breast cancer      in 1987 with a left  mastectomy  . Osteoporosis     history of bone chips in the right knee,previous right fibular fracture, operated on by Dr. Veverly Fells  . RLS (restless legs syndrome)   . Lumbar stenosis   . Hypercholesteremia   . DJD (degenerative joint disease) of knee     right knee  . Falls   . Hypertension   . Vitamin D deficiency   . Diverticulosis     Past Surgical History  Procedure Laterality Date  . Knee arthroscopy  2005  . Mastectomy, radical  1987    left breast  . Breast reconstruction  1988    left breast  . Wrist fracture surgery      left wrist  . Cardioversion  2012    There were no vitals filed for this visit.  Visit Diagnosis:  Falls, subsequent encounter  Pain in right knee      Subjective Assessment - 12/12/14 1644    Subjective brace uncomfortable.  Not painful   Currently in Pain? Yes   Pain Score --  mild   Pain Location Ankle   Pain  Orientation Right   Pain Descriptors / Indicators --  sore   Pain Frequency Intermittent   Aggravating Factors  foot brace   Pain Relieving Factors rest   Multiple Pain Sites No                         [facility] Adult PT Treatment/Exercise - 12/12/14 1646    Berg Balance Test   Sit to Stand Able to stand  independently using hands   Standing Unsupported Able to stand 2 minutes with supervision   Sitting with Back Unsupported but Feet Supported on Floor or Stool Able to sit safely and securely 2 minutes   Stand to Sit Sits independently, has uncontrolled descent   Transfers Able to transfer with verbal cueing and /or supervision   Standing Unsupported with Eyes Closed Able to stand 10 seconds with supervision   Standing Ubsupported with Feet Together Able to place feet together independently and stand for 1 minute with supervision   From Standing, Reach Forward with Outstretched Arm Can reach forward >12 cm safely (5")   From Standing Position, Pick up Object from Floor Unable to pick up  and needs supervision   From Standing Position, Turn to Look Behind Over each Shoulder Turn sideways only but maintains balance   Turn 360 Degrees Needs assistance while turning   Standing Unsupported, Alternately Place Feet on Step/Stool Needs assistance to keep from falling or unable to try   Standing Unsupported, One Foot in Front Loses balance while stepping or standing   Standing on One Leg Unable to try or needs assist to prevent fall   Total Score 25   Self-Care   Self-Care --  instructre:brace, shoes, edema walker vs cane, anatomy   Exercises   Exercises --  patellar mobilization added to home                PT Education - 12/12/14 1745    Education provided Yes   Education Details Use walker full time for safety per PT.  Patellar mobilization,     Person(s) Educated Patient   Methods Explanation;Demonstration;Tactile cues;Verbal cues;Handout  instructions were hand  written   Comprehension Verbalized understanding;Returned demonstration          PT Short Term Goals - 11/30/14 1503    PT SHORT TERM GOAL #1   Title "Independent with initial HEP   Time 4   Period Weeks   Status New   PT SHORT TERM GOAL #2   Title "Report pain decrease from 8/10 to 5/10.   Time 4   Period Weeks   Status New   PT SHORT TERM GOAL #3   Title Pt will be able to use walker and improve gait velocity to 2.81ft/sec at minimum   Time 4   Period Weeks   Status New   PT SHORT TERM GOAL #4   Title Pt will be able to improve sit to stand in order to perform 5 x without use of UE   PT SHORT TERM GOAL #5   Title Pt will perform BERG and set long term goal for reduction of fall risk   Time 4   Period Weeks   Status New           PT Long Term Goals - 11/30/14 1507    PT LONG TERM GOAL #1   Title "Pt will be independent with advanced HEP.    Time 8   Period Weeks   Status New   PT LONG TERM GOAL #2   Title "Pain will decrease pain in knee  to 3/10 or less  with all functional activities   Time 8   Period Weeks   Status New   PT LONG TERM GOAL #3   Title "FOTO will improve from   65 % limitation to 51 %    indicating improved functional mobility.    Time 8   Period Weeks   Status New   PT LONG TERM GOAL #4   Title Pt will be able to tolerate standing with assistive device with pain 3/10 or less in order to shop for groceries   Time 8   Period Weeks   Status New   PT LONG TERM GOAL #5   Title Pt will be able to single limb stance on left leg for 30 seconds with shoe orthotic and with or without brace  to improve gait and reduce fall risk   Time 8   Period Weeks   Status New               Plan - 12/12/14 1755    Clinical Impression Statement 25/56 BERG.  Patient is at risk for falling.  Patient brought walker to clinic today because Donnetta Simpers said to use it all the time.  Extra time given for answering self care questions referring any medical  questions back to the MD.     PT Next Visit Plan , balance activities and core strength.  ankle strength with T band   Consulted and Agree with Plan of Care Patient        Problem List Patient Active Problem List   Diagnosis Date Noted  . Encounter for Medicare annual wellness exam 12/06/2014  . Breast cancer 06/29/2014  . Diverticulosis   . Prediabetes 08/18/2013  . Encounter for therapeutic drug monitoring 06/23/2013  . Hypertension   . Vitamin D deficiency   . Spinal stenosis, lumbar region, with neurogenic claudication 03/28/2013    Class: Chronic  . At high risk for falls 02/23/2013  . Systolic heart failure 69/62/9528  . Congestive dilated cardiomyopathy 05/29/2010  . Atrial fibrillation 04/16/2009  . Elevated lipids 04/13/2009  . RESTLESS LEGS SYNDROME 04/13/2009  . Osteoarthritis 04/13/2009    Roxanne Panek 12/12/2014, 5:59 PM  [facility] [address] [place], [place], [address] Phone: [phone]   Fax:  [phone]     Melvenia Needles, PTA 12/12/2014 5:59 PM Phone: [phone] Fax: [phone]

## 2014-12-12 NOTE — Patient Instructions (Addendum)
Exercise in bed not on the floor. Other self care instructions verbally reviewed with verbal understanding for edema, shoes, brace don correctly (tighter), using walker 100% of time.

## 2014-12-15 ENCOUNTER — Ambulatory Visit (INDEPENDENT_AMBULATORY_CARE_PROVIDER_SITE_OTHER): Payer: Medicare Other

## 2014-12-15 DIAGNOSIS — I482 Chronic atrial fibrillation, unspecified: Secondary | ICD-10-CM

## 2014-12-15 DIAGNOSIS — Z5181 Encounter for therapeutic drug level monitoring: Secondary | ICD-10-CM | POA: Diagnosis not present

## 2014-12-15 DIAGNOSIS — I4891 Unspecified atrial fibrillation: Secondary | ICD-10-CM

## 2014-12-15 LAB — POCT INR: INR: 1.7

## 2014-12-19 ENCOUNTER — Ambulatory Visit: Payer: Medicare Other | Admitting: Physical Therapy

## 2014-12-19 DIAGNOSIS — R293 Abnormal posture: Secondary | ICD-10-CM

## 2014-12-19 DIAGNOSIS — R269 Unspecified abnormalities of gait and mobility: Secondary | ICD-10-CM

## 2014-12-19 DIAGNOSIS — R531 Weakness: Secondary | ICD-10-CM

## 2014-12-19 DIAGNOSIS — W19XXXD Unspecified fall, subsequent encounter: Secondary | ICD-10-CM

## 2014-12-19 DIAGNOSIS — M25561 Pain in right knee: Secondary | ICD-10-CM

## 2014-12-19 NOTE — Patient Instructions (Signed)
  Functional Quadriceps: Sit to Stand   Sit on edge of chair, feet flat on floor. Stand upright, extending knees fully. Use ball in between knees and hold onto chair in front for safety Repeat 10____ times per set. Do __1__ sets per session. Do _2-3___ sessions per day. You may start on a higher chair and graduate to a lower chair as you get stronger  Hip Extension (Prone)   Lift left leg _6___ inches from floor, keeping knee locked. Repeat __19__ times per set. Do __2__ sets per session. Do _1-2___ sessions per day.  http://orth.exer.us/98   Copyright  VHI. All rights reserved.  Hip Adduction: Leg Lift (Eccentric) - Side-Lying   Copyright  VHI. All rights reserved.  Abduction: Side Leg Lift (Eccentric) - Side-Lying  Strengthening: Straight Leg Raise (Phase 1)   Tighten muscles on front of right thigh, then lift leg _8___ inches from surface, keeping knee locked.  Repeat __10__ times per set. Do __2-3__ sets per session. Do __1-2__ sessions per day.  http://orth.exer.us/614   Copyright  VHI. All rights reserved.  Abduction: Clam (Eccentric) - Side-Lying   Lie on side with knees bent. Lift top knee, keeping feet together. Keep trunk steady. Slowly lower for 3-5 seconds. _10__ reps per set, _2__ sets per day, __7_ days per week.      Copyright  VHI. All rights reserved.  Voncille Lo, PT 12/19/2014 10:31 AM Phone: [phone] Fax: [phone]

## 2014-12-19 NOTE — Therapy (Addendum)
[place], [place], [address] Phone: [phone]   Fax:  [phone]  Physical Therapy Treatment/Discharge Note  Patient Details  Name: Pamela Whitaker MRN: [id number] Date of Birth: June 07, 1932 Referring Provider:  Unk Pinto, MD  Encounter Date: 12/19/2014      PT End of Session - 12/19/14 1017    Visit Number 3   Number of Visits 16   Date for PT Re-Evaluation 01/25/15   Authorization Type [employer] Medicare   Authorization Time Period 01-25-15   PT Start Time 1015   PT Stop Time 1059   PT Time Calculation (min) 44 min   Activity Tolerance Patient tolerated treatment well   Behavior During Therapy [facility] for tasks assessed/performed      Past Medical History  Diagnosis Date  . Atrial fibrillation     treated with multaq x 6 mos in 2011....[name]=2 (Age; + CHF; no CVA; no DM2). Echo 05/24/10: EF 30% mild AS(mean 41mgHg); mild LAE; mod LAE; PASP 35 Systolic CHF  . Cardiomyopathy     a. tachy mediated Myoview 06/06/10:  EF 31%; no scar or ischemia; EF up to 50% per echo November 2013  . Breast cancer      in 1987 with a left  mastectomy  . Osteoporosis     history of bone chips in the right knee,previous right fibular fracture, operated on by Dr. NVeverly Fells . RLS (restless legs syndrome)   . Lumbar stenosis   . Hypercholesteremia   . DJD (degenerative joint disease) of knee     right knee  . Falls   . Hypertension   . Vitamin D deficiency   . Diverticulosis     Past Surgical History  Procedure Laterality Date  . Knee arthroscopy  2005  . Mastectomy, radical  1987    left breast  . Breast reconstruction  1988    left breast  . Wrist fracture surgery      left wrist  . Cardioversion  2012    There were no vitals filed for this visit.  Visit Diagnosis:  Falls, subsequent encounter  Pain in right knee  Abnormality of gait  Generalized weakness  Abnormal posture      Subjective  Assessment - 12/19/14 1017    Subjective my ankle brace is fine but the top of my foot hurts some.     Pertinent History A-fib   Limitations House hold activities;Walking;Standing   Patient Stated Goals Pt wants to be able to walk without falling and hopefully without a cane.  Knee gives way.   Currently in Pain? Yes   Pain Score 5    Pain Location Ankle   Pain Orientation Right   Pain Descriptors / Indicators Aching;Sore   Pain Type Chronic pain   Pain Onset More than a month ago   Pain Frequency Intermittent   Pain Score 5   Pain Location Foot   Pain Orientation Right   Pain Descriptors / Indicators Sharp   Pain Type Chronic pain   Pain Onset More than a month ago   Pain Frequency Occasional                         [facility] Adult PT Treatment/Exercise - 12/19/14 1100    Knee/Hip Exercises: Standing   Heel Raises Both;15 reps   Heel Raises Limitations fatigue   SLS SLS heel raise 10  left ankle 10 x 1 inch, Right 1/2  inch clearance x 5   Knee/Hip Exercises: Seated   Sit to Sand 10 reps   x2 with ball in  between knees   Knee/Hip Exercises: Supine   Bridges with [employer] Strengthening;1 set;5 reps  difficulty with raising hip   Straight Leg Raises Both;1 set;10 reps  VC for motor control   Knee/Hip Exercises: Sidelying   Clams 10 x 2 bil with VC for stable hip   Knee/Hip Exercises: Prone   Hip Extension Both;10 reps   Hip Extension Limitations Pt VC for motor control   Straight Leg Raises Both;10 reps   Straight Leg Raises Limitations Pt with VC for slow controlled movement   Other Prone Exercises 5 x with knee flex and hip ext x 5 right and left   Ankle Exercises: Seated   Other Seated Ankle Exercises initial education for Red t band  and strengthening for Ankle PF, DF eversion and inversion. VC and TC  needs reiniforcement                PT Education - 12/19/14 1055    Education provided Yes   Education Details Pt given HEP  for core ,  SLR, clams, sit to stand, SLR ext in prone and initially did ANkle Strength with TBinitial teaching,   Person(s) Educated Patient   Methods Explanation;Demonstration;Handout;Verbal cues;Tactile cues   Comprehension Verbalized understanding;Returned demonstration          PT Short Term Goals - 12/19/14 1728    PT SHORT TERM GOAL #1   Title "Independent with initial HEP   Time 4   Period Weeks   Status On-going   PT SHORT TERM GOAL #2   Title "Report pain decrease from 8/10 to 5/10.   Baseline 5/10 today   Time 4   Period Weeks   Status Achieved   PT SHORT TERM GOAL #3   Title Pt will be able to use walker and improve gait velocity to 2.53f/sec at minimum   Time 4   Period Weeks   Status On-going   PT SHORT TERM GOAL #4   Title Pt will be able to improve sit to stand in order to perform 5 x without use of UE   Time 4   Period Weeks   Status On-going   PT SHORT TERM GOAL #5   Title Pt will perform BERG and set long term goal for reduction of fall risk   Time 4   Period Weeks   Status Achieved           PT Long Term Goals - 12/19/14 1729    PT LONG TERM GOAL #1   Title "Pt will be independent with advanced HEP.    Time 8   Period Weeks   Status On-going   PT LONG TERM GOAL #2   Title "Pain will decrease pain in knee  to 3/10 or less  with all functional activities   Time 8   Period Weeks   Status On-going   PT LONG TERM GOAL #3   Title "FOTO will improve from   65 % limitation to 51 %    indicating improved functional mobility.    Time 8   Period Weeks   Status On-going   PT LONG TERM GOAL #4   Title Pt will be able to tolerate standing with assistive device with pain 3/10 or less in order to shop for groceries   Time 8   Period Weeks   Status On-going  PT LONG TERM GOAL #5   Title Pt will be able to single limb stance on left leg for 30 seconds with UE support with shoe orthotic and with or without brace  to improve gait and reduce fall risk   Time 8    Period Weeks   Status Revised   Additional Long Term Goals   Additional Long Term Goals Yes   PT LONG TERM GOAL #6   Title Pt will improve BERG to at least 40/56 from 25/56 to reduce risk of fall using assistive device   Time 8   Period Weeks   Status New               Plan - 12/19/14 1052    Clinical Impression Statement Pt weak in core and was given core exercises.  Pt commented she felt like she worked very hard today.  Pt with core weakness.  and very weak glut meds.  Pt tends to stand with knees valgus.  Pt given ball between knees to decrease standing with knees locking and blocking in order to stand.  Pt also uses momentum to stand instead of motor control.  Pt stated she may be scheduling a procedure to inject collagen by MD within 2 weeks and she is aware she will need further instructions from MD .   Pt will benefit from skilled therapeutic intervention in order to improve on the following deficits Abnormal gait;Decreased activity tolerance;Decreased balance;Decreased mobility;Decreased knowledge of use of DME;Decreased coordination;Decreased range of motion;Decreased strength;Difficulty walking;Impaired flexibility;Postural dysfunction;Improper body mechanics;Pain   Rehab Potential Good   PT Frequency 2x / week   PT Duration 8 weeks   PT Treatment/Interventions ADLs/Self Care Home Management;Cryotherapy;Electrical Stimulation;Stair training;Gait training;DME Instruction;Ultrasound;Moist Heat;Iontophoresis 32m/ml Dexamethasone;Functional mobility training;Therapeutic exercise;Therapeutic activities;Balance training;Neuromuscular re-education;Manual techniques;Patient/family education;Dry needling;Splinting;Taping;Vestibular   PT Next Visit Plan , balance activities and core strength.  ankle strength with T band review exercises next visit  and progress as able.   PT Home Exercise Plan supine SLR,  and ankle strength with red tband   Consulted and Agree with Plan of Care Patient         Problem List Patient Active Problem List   Diagnosis Date Noted  . Encounter for Medicare annual wellness exam 12/06/2014  . Breast cancer 06/29/2014  . Diverticulosis   . Prediabetes 08/18/2013  . Encounter for therapeutic drug monitoring 06/23/2013  . Hypertension   . Vitamin D deficiency   . Spinal stenosis, lumbar region, with neurogenic claudication 03/28/2013    Class: Chronic  . At high risk for falls 02/23/2013  . Systolic heart failure 045/36/4680 . Congestive dilated cardiomyopathy 05/29/2010  . Atrial fibrillation 04/16/2009  . Elevated lipids 04/13/2009  . RESTLESS LEGS SYNDROME 04/13/2009  . Osteoarthritis 04/13/2009   LVoncille Lo PT 12/19/2014 5:34 PM Phone: [phone]: 3[facility] [address] [address]Phone: [phone]  Fax:  [phone] PHYSICAL THERAPY DISCHARGE SUMMARY  Visits from Start of Care: 3  Current functional level related to goals / functional outcomes: Unknown   Remaining deficits: Unknown   Education / Equipment: Initial HEP Plan:                                                    Patient goals were not met. Patient  is being discharged due to not returning since the last visit.  ?????       Voncille Lo, PT 04/05/2015 10:13 AM Phone: [phone] Fax: [phone]

## 2014-12-21 ENCOUNTER — Ambulatory Visit (INDEPENDENT_AMBULATORY_CARE_PROVIDER_SITE_OTHER): Payer: Medicare Other | Admitting: *Deleted

## 2014-12-21 DIAGNOSIS — I4891 Unspecified atrial fibrillation: Secondary | ICD-10-CM

## 2014-12-21 DIAGNOSIS — I482 Chronic atrial fibrillation, unspecified: Secondary | ICD-10-CM

## 2014-12-21 DIAGNOSIS — Z5181 Encounter for therapeutic drug level monitoring: Secondary | ICD-10-CM | POA: Diagnosis not present

## 2014-12-21 LAB — POCT INR: INR: 2.1

## 2014-12-26 ENCOUNTER — Ambulatory Visit: Payer: Medicare Other

## 2014-12-28 ENCOUNTER — Ambulatory Visit: Payer: Medicare Other | Admitting: Physical Therapy

## 2015-01-02 ENCOUNTER — Encounter: Payer: Medicare Other | Admitting: Physical Therapy

## 2015-01-04 ENCOUNTER — Encounter: Payer: Medicare Other | Admitting: Physical Therapy

## 2015-01-04 ENCOUNTER — Ambulatory Visit (INDEPENDENT_AMBULATORY_CARE_PROVIDER_SITE_OTHER): Payer: Medicare Other | Admitting: *Deleted

## 2015-01-04 DIAGNOSIS — I4891 Unspecified atrial fibrillation: Secondary | ICD-10-CM

## 2015-01-04 DIAGNOSIS — I482 Chronic atrial fibrillation, unspecified: Secondary | ICD-10-CM

## 2015-01-04 DIAGNOSIS — Z5181 Encounter for therapeutic drug level monitoring: Secondary | ICD-10-CM | POA: Diagnosis not present

## 2015-01-04 LAB — POCT INR: INR: 3.3

## 2015-01-18 ENCOUNTER — Ambulatory Visit (INDEPENDENT_AMBULATORY_CARE_PROVIDER_SITE_OTHER): Payer: Medicare Other | Admitting: *Deleted

## 2015-01-18 DIAGNOSIS — I482 Chronic atrial fibrillation, unspecified: Secondary | ICD-10-CM

## 2015-01-18 DIAGNOSIS — Z5181 Encounter for therapeutic drug level monitoring: Secondary | ICD-10-CM | POA: Diagnosis not present

## 2015-01-18 DIAGNOSIS — I4891 Unspecified atrial fibrillation: Secondary | ICD-10-CM | POA: Diagnosis not present

## 2015-01-18 LAB — POCT INR: INR: 1.7

## 2015-02-05 ENCOUNTER — Ambulatory Visit (INDEPENDENT_AMBULATORY_CARE_PROVIDER_SITE_OTHER): Payer: Medicare Other | Admitting: *Deleted

## 2015-02-05 DIAGNOSIS — I482 Chronic atrial fibrillation, unspecified: Secondary | ICD-10-CM

## 2015-02-05 DIAGNOSIS — I4891 Unspecified atrial fibrillation: Secondary | ICD-10-CM

## 2015-02-05 DIAGNOSIS — Z5181 Encounter for therapeutic drug level monitoring: Secondary | ICD-10-CM

## 2015-02-05 LAB — POCT INR: INR: 3.5

## 2015-02-19 ENCOUNTER — Ambulatory Visit (INDEPENDENT_AMBULATORY_CARE_PROVIDER_SITE_OTHER): Payer: Medicare Other | Admitting: Podiatry

## 2015-02-19 ENCOUNTER — Ambulatory Visit (INDEPENDENT_AMBULATORY_CARE_PROVIDER_SITE_OTHER): Payer: Medicare Other | Admitting: *Deleted

## 2015-02-19 ENCOUNTER — Encounter: Payer: Self-pay | Admitting: Podiatry

## 2015-02-19 VITALS — BP 128/84 | HR 87 | Resp 12

## 2015-02-19 DIAGNOSIS — I482 Chronic atrial fibrillation, unspecified: Secondary | ICD-10-CM

## 2015-02-19 DIAGNOSIS — M778 Other enthesopathies, not elsewhere classified: Secondary | ICD-10-CM

## 2015-02-19 DIAGNOSIS — M7751 Other enthesopathy of right foot: Secondary | ICD-10-CM | POA: Diagnosis not present

## 2015-02-19 DIAGNOSIS — M779 Enthesopathy, unspecified: Secondary | ICD-10-CM

## 2015-02-19 DIAGNOSIS — Z5181 Encounter for therapeutic drug level monitoring: Secondary | ICD-10-CM | POA: Diagnosis not present

## 2015-02-19 DIAGNOSIS — I4891 Unspecified atrial fibrillation: Secondary | ICD-10-CM | POA: Diagnosis not present

## 2015-02-19 DIAGNOSIS — M6789 Other specified disorders of synovium and tendon, multiple sites: Secondary | ICD-10-CM | POA: Diagnosis not present

## 2015-02-19 DIAGNOSIS — M76829 Posterior tibial tendinitis, unspecified leg: Secondary | ICD-10-CM

## 2015-02-19 LAB — POCT INR: INR: 1.8

## 2015-02-19 NOTE — Progress Notes (Signed)
She presented again today with chief complaint of pain across the dorsal aspect of the right foot extending out to the level of the first metatarsophalangeal joint which then will radiate proximally up the leg. She denies any trauma. He states this seems to be getting worse as time goes on.  Objective: Vital signs are stable she is alert and oriented 3 pulses are palpable. Neurologic sensorium is intact. Deep tendon reflexes are brisk and equal bilateral. Muscle strength +5 over 5 wrist flexors plan flexors and inverters everters of his musculature appears to be intact. She does have some weakening of the inverters on resistance and particularly with standing. On ambulation she pronates considerably dropping than her medial arch to the floor. This is more than likely posterior tibial tendon dysfunction. Upon palpation and range of motion of the forefoot is obvious that she has osteoarthritis and osteoarthritic thickening of the bones and spurring.  Assessment: Capsulitis osteoarthritis midfoot right. Pes planus with posterior tibial tendon dysfunction right.  Plan: Injected the point of maximal tenderness along Lisfranc's joints today right foot. Discussed the need for appropriate shoe gear and bracing. Should this fail I will discuss an [place] with her.

## 2015-02-21 ENCOUNTER — Other Ambulatory Visit: Payer: Self-pay | Admitting: Cardiovascular Disease

## 2015-03-06 ENCOUNTER — Ambulatory Visit (INDEPENDENT_AMBULATORY_CARE_PROVIDER_SITE_OTHER): Payer: Medicare Other | Admitting: Pharmacist

## 2015-03-06 ENCOUNTER — Ambulatory Visit (INDEPENDENT_AMBULATORY_CARE_PROVIDER_SITE_OTHER): Payer: Medicare Other | Admitting: Physician Assistant

## 2015-03-06 ENCOUNTER — Encounter: Payer: Self-pay | Admitting: Physician Assistant

## 2015-03-06 VITALS — BP 140/80 | HR 137 | Temp 97.5°F | Resp 16 | Ht 64.0 in | Wt 149.0 lb

## 2015-03-06 DIAGNOSIS — Z5181 Encounter for therapeutic drug level monitoring: Secondary | ICD-10-CM

## 2015-03-06 DIAGNOSIS — J209 Acute bronchitis, unspecified: Secondary | ICD-10-CM | POA: Diagnosis not present

## 2015-03-06 DIAGNOSIS — I482 Chronic atrial fibrillation, unspecified: Secondary | ICD-10-CM

## 2015-03-06 DIAGNOSIS — I4891 Unspecified atrial fibrillation: Secondary | ICD-10-CM

## 2015-03-06 DIAGNOSIS — I420 Dilated cardiomyopathy: Secondary | ICD-10-CM | POA: Diagnosis not present

## 2015-03-06 LAB — POCT INR: INR: 3.1

## 2015-03-06 MED ORDER — AZITHROMYCIN 250 MG PO TABS: ORAL_TABLET | ORAL | Status: AC

## 2015-03-06 MED ORDER — ALBUTEROL SULFATE HFA 108 (90 BASE) MCG/ACT IN AERS: 2.0000 | INHALATION_SPRAY | RESPIRATORY_TRACT | Status: DC | PRN

## 2015-03-06 MED ORDER — IPRATROPIUM-ALBUTEROL 0.5-2.5 (3) MG/3ML IN SOLN
3.0000 mL | Freq: Once | RESPIRATORY_TRACT | Status: AC
Start: 2015-03-06 — End: 2015-03-06
  Administered 2015-03-06: 3 mL via RESPIRATORY_TRACT

## 2015-03-06 NOTE — Progress Notes (Signed)
Subjective:    Patient ID: Pamela Whitaker, female    DOB: 30-Apr-1933, 81 y.o.   MRN: [id number]  HPI 81 y.o. WF with history of HTN, Afib, chol, preDM presents with sinus congestion. She had sneezing, sinus congestion, sore throat x Wednesday of last week, then she started to cough nonproductive, occ yellow mucus. Having "rattling" in her chest with lying down. Has history of pneumonia in Jan. Her weight is down, no PND, orthopnea, + SOB.  No fever, chills.   Wt Readings from Last 3 Encounters:  03/06/15 149 lb (67.586 kg)  12/06/14 151 lb (68.493 kg)  09/28/14 155 lb (70.308 kg)    Blood pressure 140/80, pulse 137, temperature 97.5 F (36.4 C), temperature source Temporal, resp. rate 16, height 5\' 4"  (1.626 m), weight 149 lb (67.586 kg), SpO2 98 %.    Current Outpatient Prescriptions on File Prior to Visit  Medication Sig Dispense Refill  . BOOSTRIX 5-2.5-18.5 injection     . carvedilol (COREG) 6.25 MG tablet TAKE 1 TABLET (6.25 MG TOTAL) BY MOUTH 2 (TWO) TIMES DAILY WITH A MEAL. 60 tablet 11  . Cholecalciferol (VITAMIN D) 2000 UNITS tablet Take 2,000 Units by mouth 2 (two) times daily. Does not take regularly    . Cyanocobalamin (VITAMIN B 12 PO) Take by mouth daily. Takes every 2-3 days    . doxycycline (VIBRAMYCIN) 100 MG capsule Take 1 capsule  2 x day on a full stomach for 30 days 60 capsule 0  . fluticasone (FLONASE) 50 MCG/ACT nasal spray Place 1 spray into both nostrils daily. 16 g 3  . Multiple Vitamin (MULTIVITAMIN) capsule Take 1 capsule by mouth daily. Women's Centrum    . warfarin (COUMADIN) 2.5 MG tablet TAKE AS DIRECTED BY COUMADIN CLINIC 35 tablet 3   No current facility-administered medications on file prior to visit.   Past Medical History  Diagnosis Date  . Atrial fibrillation ([place])     treated with multaq x 6 mos in 2011....[name]=2 (Age; + CHF; no CVA; no DM2). Echo 05/24/10: EF 30% mild AS(mean 18mmgHg); mild LAE; mod LAE; PASP 35 Systolic CHF  .  Cardiomyopathy     a. tachy mediated Myoview 06/06/10:  EF 31%; no scar or ischemia; EF up to 50% per echo November 2013  . Breast cancer ([place])      in 1987 with a left  mastectomy  . Osteoporosis     history of bone chips in the right knee,previous right fibular fracture, operated on by Dr. Veverly Fells  . RLS (restless legs syndrome)   . Lumbar stenosis   . Hypercholesteremia   . DJD (degenerative joint disease) of knee     right knee  . Falls   . Hypertension   . Vitamin D deficiency   . Diverticulosis    Review of Systems  Constitutional: Negative for fever, chills, diaphoresis and fatigue.  HENT: Positive for congestion, postnasal drip, sinus pressure, sneezing and sore throat. Negative for ear pain.   Respiratory: Positive for cough and wheezing. Negative for chest tightness and shortness of breath.   Cardiovascular: Negative.   Gastrointestinal: Negative.   Genitourinary: Negative.   Musculoskeletal: Negative for neck pain.  Neurological: Negative for headaches.       Objective:   Physical Exam  Constitutional: She is oriented to person, place, and time. She appears well-developed and well-nourished.  HENT:  Head: Normocephalic and atraumatic.  Right Ear: External ear normal.  Left Ear: External ear normal.  Nose: Nose  normal.  Mouth/Throat: Oropharynx is clear and moist. No oropharyngeal exudate.  Cloudy TMS  Eyes: Conjunctivae and EOM are normal.  Neck: Normal range of motion.  Cardiovascular: Normal heart sounds and intact distal pulses.  An irregularly irregular rhythm present. Tachycardia present.   Pulmonary/Chest: Effort normal. She has wheezes.  Congested cough  Musculoskeletal: Normal range of motion.  Lymphadenopathy:    She has no cervical adenopathy.  Neurological: She is alert and oriented to person, place, and time.  Skin: Skin is warm and dry.  Psychiatric: She has a normal mood and affect. Judgment normal.  Nursing note and vitals reviewed.       Assessment & Plan:  Bronchitis Albuterol inhaler, DuoNEB in the office, sample of inhaler dulera May need to increase coreg while sick to 2 a day and monitor BP/HR Will hold the zpak and take if she is not getting better, increase fluids, rest, cont allergy pill

## 2015-03-06 NOTE — Patient Instructions (Signed)
I will give you a prescription for an antibiotic, but please only take it if you are not feeling better in 7-10 days.  Bronchitis is mostly caused by viruses and the antibiotic will do nothing.  Can do samples steroid inhaler if do not tolerate oral steroids, do 1 puff twice a day and wash mouth out afterwards to avoid yeast.    Rest and stay hydrated.  Make sure you drink plenty of fluids to make sure urine is clear when you urinate.  Water will help thin out mucous. - Take Mucinex DM- Maximum Strength over the counter to thin out and cough up the thick mucous.  Please follow directions on box. -Take Albuterol as needed.   If you feel your heart beating faster, increase your coreg to 2 pills a day and monitor your blood pressure, heart rate.   Go to the ER if any CP, SOB, nausea, dizziness, severe HA, changes vision/speech  Risk of antibiotic use: About 1 in 4 people who take antibiotics have side effects including stomach problems, dizziness, or rashes. Those problems clear up soon after stopping the drugs, but in rare cases antibiotics can cause severe allergic reaction. Over use of antibiotics also encourages the growth of bacteria that can't be controlled easily with drugs. That makes you more vunerable to antibiotic-resistant infections and undermines the benefits of antibiotics for others.   Waste of Money: Antibiotics often aren't very expensive, but any money spent on unnecessary drugs is money down the drain.   When are antibiotics needed? Only when symptoms last longer than a week.  Start to improve but then worsen again  Please call the office or message through My Chart if you have any questions.   Acute Bronchitis Bronchitis is when the airways that extend from the windpipe into the lungs get red, puffy, and painful (inflamed). Bronchitis often causes thick spit (mucus) to develop. This leads to a cough. A cough is the most common symptom of bronchitis. In acute bronchitis,  the condition usually begins suddenly and goes away over time (usually in 2 weeks). Smoking, allergies, and asthma can make bronchitis worse. Repeated episodes of bronchitis may cause more lung problems.  Most common cause of Bronchitis is viruses (rhinovirus, coronavirus, RSV).  Therefore, not requiring an antibiotic; as antibiotics only treat bacterial infections.  HOME CARE  Rest.  Drink enough fluids to keep your pee (urine) clear or pale yellow (unless you need to limit fluids as told by your doctor).  Only take over-the-counter or prescription medicines as told by your doctor.  Avoid smoking and secondhand smoke. These can make bronchitis worse. If you are a smoker, think about using nicotine gum or skin patches. Quitting smoking will help your lungs heal faster.  Reduce the chance of getting bronchitis again by:  Washing your hands often.  Avoiding people with cold symptoms.  Trying not to touch your hands to your mouth, nose, or eyes.  Follow up with your doctor as told. GET HELP IF: Your symptoms do not improve after 1 week of treatment. Symptoms include:  Cough.  Fever.  Coughing up thick spit.  Body aches.  Chest congestion.  Chills.  Shortness of breath.  Sore throat. GET HELP RIGHT AWAY IF:   You have an increased fever.  You have chills.  You have severe shortness of breath.  You have bloody thick spit (sputum).  You throw up (vomit) often.  You lose too much body fluid (dehydration).  You have a severe headache.  You faint. MAKE SURE YOU:   Understand these instructions.  Will watch your condition.  Will get help right away if you are not doing well or get worse. Document Released: 10/29/2007 Document Revised: 01/12/2013 Document Reviewed: 11/02/2012 [facility] Patient Information 2015 [place], [place]. This information is not intended to replace advice given to you by your health care provider. Make sure you discuss any questions you have  with your health care provider.

## 2015-03-20 ENCOUNTER — Encounter: Payer: Self-pay | Admitting: Internal Medicine

## 2015-03-20 ENCOUNTER — Ambulatory Visit (INDEPENDENT_AMBULATORY_CARE_PROVIDER_SITE_OTHER): Payer: Medicare Other | Admitting: Internal Medicine

## 2015-03-20 VITALS — BP 118/72 | HR 76 | Temp 97.0°F | Resp 16 | Ht 64.5 in | Wt 150.2 lb

## 2015-03-20 DIAGNOSIS — E559 Vitamin D deficiency, unspecified: Secondary | ICD-10-CM

## 2015-03-20 DIAGNOSIS — R7303 Prediabetes: Secondary | ICD-10-CM

## 2015-03-20 DIAGNOSIS — E782 Mixed hyperlipidemia: Secondary | ICD-10-CM | POA: Insufficient documentation

## 2015-03-20 DIAGNOSIS — Z23 Encounter for immunization: Secondary | ICD-10-CM

## 2015-03-20 DIAGNOSIS — Z79899 Other long term (current) drug therapy: Secondary | ICD-10-CM

## 2015-03-20 DIAGNOSIS — I10 Essential (primary) hypertension: Secondary | ICD-10-CM

## 2015-03-20 LAB — CBC WITH DIFFERENTIAL/PLATELET
Basophils Absolute: 0.1 10*3/uL (ref 0.0–0.1)
Basophils Relative: 1 % (ref 0–1)
EOS ABS: 0.1 10*3/uL (ref 0.0–0.7)
Eosinophils Relative: 1 % (ref 0–5)
HCT: 43.2 % (ref 36.0–46.0)
HEMOGLOBIN: 14.3 g/dL (ref 12.0–15.0)
LYMPHS PCT: 19 % (ref 12–46)
Lymphs Abs: 1.6 10*3/uL (ref 0.7–4.0)
MCH: 30.1 pg (ref 26.0–34.0)
MCHC: 33.1 g/dL (ref 30.0–36.0)
MCV: 90.9 fL (ref 78.0–100.0)
MONO ABS: 0.6 10*3/uL (ref 0.1–1.0)
MPV: 9.4 fL (ref 8.6–12.4)
Monocytes Relative: 7 % (ref 3–12)
NEUTROS ABS: 6.2 10*3/uL (ref 1.7–7.7)
Neutrophils Relative %: 72 % (ref 43–77)
Platelets: 117 10*3/uL — ABNORMAL LOW (ref 150–400)
RBC: 4.75 MIL/uL (ref 3.87–5.11)
RDW: 13.7 % (ref 11.5–15.5)
WBC: 8.6 10*3/uL (ref 4.0–10.5)

## 2015-03-20 LAB — BASIC METABOLIC PANEL WITH GFR
BUN: 14 mg/dL (ref 7–25)
CALCIUM: 9.6 mg/dL (ref 8.6–10.4)
CO2: 28 mmol/L (ref 20–31)
CREATININE: 0.67 mg/dL (ref 0.60–0.88)
Chloride: 101 mmol/L (ref 98–110)
GFR, Est Non African American: 83 mL/min (ref >=60)
Glucose, Bld: 92 mg/dL (ref 65–99)
Potassium: 4.3 mmol/L (ref 3.5–5.3)
Sodium: 138 mmol/L (ref 135–146)

## 2015-03-20 LAB — LIPID PANEL
CHOLESTEROL: 265 mg/dL — AB (ref 125–200)
HDL: 47 mg/dL (ref >=46)
LDL Cholesterol: 171 mg/dL — ABNORMAL HIGH (ref <130)
Total CHOL/HDL Ratio: 5.6 Ratio — ABNORMAL HIGH (ref <=5.0)
Triglycerides: 233 mg/dL — ABNORMAL HIGH (ref <150)
VLDL: 47 mg/dL — ABNORMAL HIGH (ref <30)

## 2015-03-20 LAB — HEPATIC FUNCTION PANEL
ALT: 16 U/L (ref 6–29)
AST: 17 U/L (ref 10–35)
Albumin: 3.9 g/dL (ref 3.6–5.1)
Alkaline Phosphatase: 72 U/L (ref 33–130)
BILIRUBIN DIRECT: 0.1 mg/dL (ref <=0.2)
Indirect Bilirubin: 0.7 mg/dL (ref 0.2–1.2)
TOTAL PROTEIN: 7.1 g/dL (ref 6.1–8.1)
Total Bilirubin: 0.8 mg/dL (ref 0.2–1.2)

## 2015-03-20 LAB — TSH: TSH: 2.498 u[IU]/mL (ref 0.350–4.500)

## 2015-03-20 LAB — MAGNESIUM: MAGNESIUM: 2.1 mg/dL (ref 1.5–2.5)

## 2015-03-20 NOTE — Addendum Note (Signed)
Addended by: Melbourne Abts C on: 03/20/2015 04:55 PM   Modules accepted: Orders

## 2015-03-20 NOTE — Progress Notes (Signed)
Patient ID: Pamela Whitaker, female   DOB: [birth year], 81 y.o.   MRN: [id number]  R E S C H E D U L E D

## 2015-03-21 LAB — VITAMIN D 25 HYDROXY (VIT D DEFICIENCY, FRACTURES): VIT D 25 HYDROXY: 34 ng/mL (ref 30–100)

## 2015-03-21 LAB — INSULIN, RANDOM: INSULIN: 8.8 u[IU]/mL (ref 2.0–19.6)

## 2015-03-21 LAB — HEMOGLOBIN A1C
HEMOGLOBIN A1C: 5.6 % (ref <5.7)
Mean Plasma Glucose: 114 mg/dL (ref <117)

## 2015-03-27 ENCOUNTER — Ambulatory Visit (INDEPENDENT_AMBULATORY_CARE_PROVIDER_SITE_OTHER): Payer: Medicare Other

## 2015-03-27 DIAGNOSIS — Z5181 Encounter for therapeutic drug level monitoring: Secondary | ICD-10-CM | POA: Diagnosis not present

## 2015-03-27 DIAGNOSIS — I4891 Unspecified atrial fibrillation: Secondary | ICD-10-CM

## 2015-03-27 LAB — POCT INR: INR: 2.6

## 2015-04-24 ENCOUNTER — Ambulatory Visit (INDEPENDENT_AMBULATORY_CARE_PROVIDER_SITE_OTHER): Payer: Medicare Other | Admitting: Pharmacist Clinician (PhC)/ Clinical Pharmacy Specialist

## 2015-04-24 DIAGNOSIS — Z5181 Encounter for therapeutic drug level monitoring: Secondary | ICD-10-CM

## 2015-04-24 DIAGNOSIS — I4891 Unspecified atrial fibrillation: Secondary | ICD-10-CM

## 2015-04-24 LAB — POCT INR: INR: 2.7

## 2015-05-29 ENCOUNTER — Ambulatory Visit (INDEPENDENT_AMBULATORY_CARE_PROVIDER_SITE_OTHER): Payer: Medicare Other | Admitting: *Deleted

## 2015-05-29 DIAGNOSIS — Z5181 Encounter for therapeutic drug level monitoring: Secondary | ICD-10-CM

## 2015-05-29 DIAGNOSIS — I4891 Unspecified atrial fibrillation: Secondary | ICD-10-CM

## 2015-05-29 LAB — POCT INR: INR: 1.8

## 2015-06-06 ENCOUNTER — Encounter: Payer: Self-pay | Admitting: Physician Assistant

## 2015-06-12 ENCOUNTER — Encounter: Payer: Self-pay | Admitting: Podiatry

## 2015-06-12 ENCOUNTER — Ambulatory Visit (INDEPENDENT_AMBULATORY_CARE_PROVIDER_SITE_OTHER): Payer: Medicare Other | Admitting: Podiatry

## 2015-06-12 VITALS — BP 137/88 | HR 80 | Resp 16

## 2015-06-12 DIAGNOSIS — M76829 Posterior tibial tendinitis, unspecified leg: Secondary | ICD-10-CM

## 2015-06-12 DIAGNOSIS — M6789 Other specified disorders of synovium and tendon, multiple sites: Secondary | ICD-10-CM

## 2015-06-12 DIAGNOSIS — L600 Ingrowing nail: Secondary | ICD-10-CM | POA: Diagnosis not present

## 2015-06-12 NOTE — Progress Notes (Signed)
She presents today concerned about ingrown toenails. She states that they were bothering her but she went and had a pedicure done and now they're better. She would like to consider new orthotics for her bilateral feet. She states that she still walks on her ankles and would like to consider orthotics.  Objective: Vital signs are stable she is alert and oriented 3. No erythema or edema saline as drainage or odor to her toes today. No signs of ingrown toenails. Pulses are palpable. She still has posterior tibial tendon dysfunction with collapse of the medial longitudinal arch currently she is wearing orthotics that seemingly rectify her situation.  Assessment posterior tibial tendon dysfunction bilaterally. Resolving ingrown toenails.  Plan: I encouraged her to bring her orthotics and shoe gear that she wears on her next visit. She states that the arthritis is stable at this point and will follow-up with me in 2 months. We will revaluate her gait at that time and consider whether or not we need a [place] brace A messol brace or just simple orthotics. She went to [facility] last time to have her orthotics made.

## 2015-06-17 ENCOUNTER — Other Ambulatory Visit: Payer: Self-pay | Admitting: Cardiovascular Disease

## 2015-06-19 ENCOUNTER — Ambulatory Visit (INDEPENDENT_AMBULATORY_CARE_PROVIDER_SITE_OTHER): Payer: Medicare Other | Admitting: *Deleted

## 2015-06-19 DIAGNOSIS — I4891 Unspecified atrial fibrillation: Secondary | ICD-10-CM

## 2015-06-19 DIAGNOSIS — Z5181 Encounter for therapeutic drug level monitoring: Secondary | ICD-10-CM

## 2015-06-19 LAB — POCT INR: INR: 1.9

## 2015-06-27 ENCOUNTER — Other Ambulatory Visit (HOSPITAL_COMMUNITY): Payer: Medicare Other

## 2015-06-29 ENCOUNTER — Encounter (HOSPITAL_COMMUNITY): Payer: Medicare Other

## 2015-06-29 ENCOUNTER — Encounter (HOSPITAL_COMMUNITY): Payer: Self-pay | Admitting: Hematology & Oncology

## 2015-06-29 ENCOUNTER — Encounter (HOSPITAL_COMMUNITY): Payer: Medicare Other | Attending: Hematology & Oncology | Admitting: Hematology & Oncology

## 2015-06-29 VITALS — BP 132/74 | HR 68 | Temp 98.0°F | Resp 18 | Wt 152.4 lb

## 2015-06-29 DIAGNOSIS — C50919 Malignant neoplasm of unspecified site of unspecified female breast: Secondary | ICD-10-CM | POA: Diagnosis present

## 2015-06-29 DIAGNOSIS — I4891 Unspecified atrial fibrillation: Secondary | ICD-10-CM | POA: Diagnosis not present

## 2015-06-29 DIAGNOSIS — Z853 Personal history of malignant neoplasm of breast: Secondary | ICD-10-CM

## 2015-06-29 DIAGNOSIS — I10 Essential (primary) hypertension: Secondary | ICD-10-CM | POA: Diagnosis not present

## 2015-06-29 DIAGNOSIS — R21 Rash and other nonspecific skin eruption: Secondary | ICD-10-CM | POA: Diagnosis not present

## 2015-06-29 DIAGNOSIS — Z9889 Other specified postprocedural states: Secondary | ICD-10-CM | POA: Insufficient documentation

## 2015-06-29 DIAGNOSIS — Z9012 Acquired absence of left breast and nipple: Secondary | ICD-10-CM | POA: Insufficient documentation

## 2015-06-29 DIAGNOSIS — E7800 Pure hypercholesterolemia, unspecified: Secondary | ICD-10-CM | POA: Insufficient documentation

## 2015-06-29 DIAGNOSIS — G4700 Insomnia, unspecified: Secondary | ICD-10-CM | POA: Diagnosis not present

## 2015-06-29 DIAGNOSIS — G2581 Restless legs syndrome: Secondary | ICD-10-CM | POA: Insufficient documentation

## 2015-06-29 DIAGNOSIS — Z7901 Long term (current) use of anticoagulants: Secondary | ICD-10-CM

## 2015-06-29 DIAGNOSIS — C50912 Malignant neoplasm of unspecified site of left female breast: Secondary | ICD-10-CM

## 2015-06-29 DIAGNOSIS — E559 Vitamin D deficiency, unspecified: Secondary | ICD-10-CM | POA: Insufficient documentation

## 2015-06-29 LAB — COMPREHENSIVE METABOLIC PANEL
ALT: 17 U/L (ref 14–54)
AST: 17 U/L (ref 15–41)
Albumin: 3.9 g/dL (ref 3.5–5.0)
Alkaline Phosphatase: 60 U/L (ref 38–126)
Anion gap: 4 — ABNORMAL LOW (ref 5–15)
BUN: 13 mg/dL (ref 6–20)
CHLORIDE: 105 mmol/L (ref 101–111)
CO2: 31 mmol/L (ref 22–32)
Calcium: 9.4 mg/dL (ref 8.9–10.3)
Creatinine, Ser: 0.76 mg/dL (ref 0.44–1.00)
GFR calc non Af Amer: >60 mL/min (ref >60)
GLUCOSE: 79 mg/dL (ref 65–99)
Potassium: 4.3 mmol/L (ref 3.5–5.1)
SODIUM: 140 mmol/L (ref 135–145)
TOTAL PROTEIN: 7.1 g/dL (ref 6.5–8.1)
Total Bilirubin: 0.7 mg/dL (ref 0.3–1.2)

## 2015-06-29 LAB — CBC WITH DIFFERENTIAL/PLATELET
BASOS ABS: 0 10*3/uL (ref 0.0–0.1)
BASOS PCT: 1 %
EOS ABS: 0.1 10*3/uL (ref 0.0–0.7)
EOS PCT: 2 %
HCT: 41 % (ref 36.0–46.0)
Hemoglobin: 13.9 g/dL (ref 12.0–15.0)
Lymphocytes Relative: 20 %
Lymphs Abs: 1.3 10*3/uL (ref 0.7–4.0)
MCH: 31.5 pg (ref 26.0–34.0)
MCHC: 33.9 g/dL (ref 30.0–36.0)
MCV: 93 fL (ref 78.0–100.0)
Monocytes Absolute: 0.7 10*3/uL (ref 0.1–1.0)
Monocytes Relative: 10 %
Neutro Abs: 4.5 10*3/uL (ref 1.7–7.7)
Neutrophils Relative %: 67 %
PLATELETS: 187 10*3/uL (ref 150–400)
RBC: 4.41 MIL/uL (ref 3.87–5.11)
RDW: 12.2 % (ref 11.5–15.5)
WBC: 6.7 10*3/uL (ref 4.0–10.5)

## 2015-06-29 NOTE — Progress Notes (Signed)
[facility], [place] [address]   DIAGNOSIS:  Stage III adenocarcinoma of the breast  April 10, 1986 consisting of a left mastectomy with reconstruction by Dr. Donnie Coffin  6.5 cm cancer with 2 of 27 positive lymph nodes status post CAF VP chemotherapy.   She took chemotherapy for a year followed by hormonal therapy for 12 months but was stopped due to intolerance. At that time, tried Megace and tamoxifen and she opted not to try anything further.  CURRENT THERAPY: Observation  INTERVAL HISTORY: Pamela Whitaker 82 y.o. female returns for follow-up of her breast cancer.   [name] returns to the [facility] today accompanied by her sister-in-law. She is due for a breast exam today.  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  She confirms that she takes vitamin D.  In terms of concerns, worries, anything new or different, she says she can't think of anything. She confirms that her appetite is very good, but says she can't sleep at night, and would like to be given something that's not habit-forming.  She has an itchy spot on her neck, on the left side. She comments that this is bothering her and that she is going to consult with Dr. Ronnald Ramp about it.  As she ambulates to the exam table and climbs up on it, she says that her right knee is the knee that bothers her. She notes that this is her only complaint and that it is chronic.    MEDICAL HISTORY: Past Medical History  Diagnosis Date  . Atrial fibrillation ([place])     treated with multaq x 6 mos in 2011....[name]=2 (Age; + CHF; no CVA; no DM2). Echo 05/24/10: EF 30% mild AS(mean 98mmgHg); mild LAE; mod LAE; PASP 35 Systolic CHF  . Cardiomyopathy     a. tachy mediated Myoview 06/06/10:  EF 31%; no scar or ischemia; EF up to 50% per echo November 2013  . Breast cancer ([place])       in 1987 with a left  mastectomy  . Osteoporosis     history of bone chips in the right knee,previous right fibular fracture, operated on by Dr. Veverly Fells  . RLS (restless legs syndrome)   . Lumbar stenosis   . Hypercholesteremia   . DJD (degenerative joint disease) of knee     right knee  . Falls   . Hypertension   . Vitamin D deficiency   . Diverticulosis     has Elevated lipids; RESTLESS LEGS SYNDROME; Atrial fibrillation ([place]); Osteoarthritis; Congestive dilated cardiomyopathy ([place]); Systolic heart failure ([place]); At high risk for falls; Spinal stenosis, lumbar region, with neurogenic claudication; Hypertension; Vitamin D deficiency; Encounter for therapeutic drug monitoring; Prediabetes; Diverticulosis; Breast cancer ([place]); Encounter for Medicare annual wellness exam; Medication management; and Mixed hyperlipidemia on her problem list.     is allergic to amiodarone; lipitor; and multaq.  [name] does not currently have medications on file.  SURGICAL HISTORY: Past Surgical History  Procedure Laterality Date  . Knee arthroscopy  2005  . Mastectomy, radical  1987    left breast  . Breast reconstruction  1988    left breast  . Wrist fracture surgery      left wrist  . Cardioversion  2012    SOCIAL HISTORY: Social History   Social History  . Marital Status: Married  Spouse Name: Chrissie Noa  . Number of Children: 2  . Years of Education: college   Occupational History  . retired    Social History Main Topics  . Smoking status: Never Smoker   . Smokeless tobacco: Never Used  . Alcohol Use: No  . Drug Use: No  . Sexual Activity: No   Other Topics Concern  . Not on file   Social History Narrative    FAMILY HISTORY: Family History  Problem Relation Age of Onset  . Heart attack Father   . Aortic aneurysm Mother   . Cancer Sister     breast cancer  . Diabetes Sister   . Cancer Sister     stomach    Review of Systems  Constitutional: Negative.     HENT: Negative.   Eyes: Negative.   Respiratory: Negative.   Cardiovascular: Negative.   Gastrointestinal: Negative.   Genitourinary: Positive for urgency and frequency.       Nocturia  Musculoskeletal: Positive for joint pain. Negative for falls.       Uses cane  Skin: Negative.   Neurological: Negative.   Endo/Heme/Allergies: Negative.   Psychiatric/Behavioral: Negative for depression and suicidal ideas. The patient has insomnia.   14 point review of systems was performed and is negative except as detailed under history of present illness and above   PHYSICAL EXAMINATION  ECOG PERFORMANCE STATUS: 0 - Asymptomatic  Filed Vitals:   06/29/15 1151  BP: 132/74  Pulse: 68  Temp: 98 F (36.7 C)  Resp: 18    Physical Exam  Constitutional: She is oriented to person, place, and time and well-developed, well-nourished, and in no distress.  HENT:  Head: Normocephalic and atraumatic.  Nose: Nose normal.  Mouth/Throat: Oropharynx is clear and moist. No oropharyngeal exudate.  Eyes: Conjunctivae and EOM are normal. Pupils are equal, round, and reactive to light. Right eye exhibits no discharge. Left eye exhibits no discharge. No scleral icterus.  Neck: Normal range of motion. Neck supple. No tracheal deviation present. No thyromegaly present.  Cardiovascular: Normal rate, regular rhythm and normal heart sounds.  Exam reveals no gallop and no friction rub.   No murmur heard. irreg irreg  Pulmonary/Chest: Effort normal and breath sounds normal. She has no wheezes. She has no rales.    Abdominal: Soft. Bowel sounds are normal. She exhibits no distension and no mass. There is no tenderness. There is no rebound and no guarding.  Musculoskeletal: Normal range of motion. She exhibits no edema.  Lymphadenopathy:    She has no cervical adenopathy.       Right cervical: No superficial cervical adenopathy present.      Left cervical: No superficial cervical adenopathy present.       Right  axillary: No pectoral and no lateral adenopathy present.       Left axillary: No pectoral and no lateral adenopathy present. Neurological: She is alert and oriented to person, place, and time. She has normal reflexes. No cranial nerve deficit. Coordination normal.  Walks with limp secondary to R knee  Skin: Skin is warm and dry. No rash noted.  Psychiatric: Mood, memory, affect and judgment normal.  Nursing note and vitals reviewed.   LABORATORY DATA: I have reviewed the data as listed.  CBC    Component Value Date/Time   WBC 6.7 06/29/2015 1042   RBC 4.41 06/29/2015 1042   HGB 13.9 06/29/2015 1042   HCT 41.0 06/29/2015 1042   PLT 187 06/29/2015 1042   MCV 93.0  06/29/2015 1042   MCH 31.5 06/29/2015 1042   MCHC 33.9 06/29/2015 1042   RDW 12.2 06/29/2015 1042   LYMPHSABS 1.3 06/29/2015 1042   MONOABS 0.7 06/29/2015 1042   EOSABS 0.1 06/29/2015 1042   BASOSABS 0.0 06/29/2015 1042   CMP     Component Value Date/Time   NA 140 06/29/2015 1042   K 4.3 06/29/2015 1042   CL 105 06/29/2015 1042   CO2 31 06/29/2015 1042   GLUCOSE 79 06/29/2015 1042   BUN 13 06/29/2015 1042   CREATININE 0.76 06/29/2015 1042   CREATININE 0.67 03/20/2015 1351   CALCIUM 9.4 06/29/2015 1042   PROT 7.1 06/29/2015 1042   ALBUMIN 3.9 06/29/2015 1042   AST 17 06/29/2015 1042   ALT 17 06/29/2015 1042   ALKPHOS 60 06/29/2015 1042   BILITOT 0.7 06/29/2015 1042   GFRNONAA >60 06/29/2015 1042   GFRNONAA 83 03/20/2015 1351   GFRAA >60 06/29/2015 1042   GFRAA >89 03/20/2015 1351     ASSESSMENT and THERAPY PLAN:  Stage III adenocarcinoma of the breast  April 10, 1986 consisting of a left mastectomy with reconstruction by Dr. Donnie Coffin  6.5 cm cancer with 2 of 27 positive lymph nodes status post CAF VP chemotherapy.  Chronic R knee pain  The results of her lab work are not back today. She is due for a R mammogram in February.  She will continue with yearly follow-up with labs, breast exam and  physical exam. She has come to expect tumor markers at her visits. In spite of discussing that this are not recommended for ongoing follow-up the patient is insistent.   She is due for bone density in October of this year, which we will order today.  For her issues sleeping, I recommend that she take up to 10mg  melatonin.   When she sees Dr. Ronnald Ramp, her dermatologist, I recommend that he look at a spot on her left chest as well. Her sister in law notes that she takes the patient to her appointments and will point this out as well.   Orders Placed This Encounter  Procedures  . DG Bone Density    Amy, esigned, U[employer] Medicare, no pac needed, nbs    Standing Status: Future     Number of Occurrences:      Standing Expiration Date: 06/28/2016    Order Specific Question:  Reason for Exam (SYMPTOM  OR DIAGNOSIS REQUIRED)    Answer:  postmenopausal    Order Specific Question:  Preferred imaging location?    Answer:  [facility]  . CBC with Differential    Standing Status: Future     Number of Occurrences:      Standing Expiration Date: 06/28/2017  . Comprehensive metabolic panel    Standing Status: Future     Number of Occurrences:      Standing Expiration Date: 06/28/2017  . Cancer antigen 27.29    Standing Status: Future     Number of Occurrences:      Standing Expiration Date: 06/28/2017   All questions were answered. The patient knows to call the clinic with any problems, questions or concerns. We can certainly see the patient much sooner if necessary.  This document serves as a record of services personally performed by Ancil Linsey, MD. It was created on her behalf by Toni Amend, a trained medical scribe. The creation of this record is based on the scribe's personal observations and the provider's statements to them. This document has been  checked and approved by the attending provider.  I have reviewed the above documentation for accuracy and completeness, and I agree with  the above.  Molli Hazard, MD  06/29/2015

## 2015-06-29 NOTE — Patient Instructions (Addendum)
[facility] Discharge Instructions  RECOMMENDATIONS MADE BY THE CONSULTANT AND ANY TEST RESULTS WILL BE SENT TO YOUR REFERRING PHYSICIAN.   Exam and discussion by Dr Whitney Muse today Bone Density is due in October  Clinical Breast Exam  You can take melatonin 6 mg, if that doesn't work you can increase this up to 10 mg   You have an area to get looked at on your chest by your dermatologist Return to see the doctor in 1 year with lab work  Please call the clinic if you have any questions or concerns       Thank you for choosing [facility] at [facility] to provide your oncology and hematology care.  To afford each patient quality time with our provider, please arrive at least 15 minutes before your scheduled appointment time.   Beginning January 23rd 2017 lab work for the [facility] will be done in the  Main lab at [facility] on 1st floor. If you have a lab appointment with the [facility] please come in thru the  Main Entrance and check in at the main information desk  You need to re-schedule your appointment should you arrive 10 or more minutes late.  We strive to give you quality time with our providers, and arriving late affects you and other patients whose appointments are after yours.  Also, if you no show three or more times for appointments you may be dismissed from the clinic at the providers discretion.     Again, thank you for choosing [facility].  Our hope is that these requests will decrease the amount of time that you wait before being seen by our physicians.       _____________________________________________________________  Should you have questions after your visit to [facility], please contact our office at [phone] between the hours of 8:30 a.m. and 4:30 p.m.  Voicemails left after 4:30 p.m. will not be returned until the following business day.  For prescription refill  requests, have your pharmacy contact our office.

## 2015-06-30 LAB — CANCER ANTIGEN 27.29: CA 27.29: 30.9 U/mL (ref 0.0–38.6)

## 2015-07-03 ENCOUNTER — Ambulatory Visit (INDEPENDENT_AMBULATORY_CARE_PROVIDER_SITE_OTHER): Payer: Medicare Other | Admitting: *Deleted

## 2015-07-03 ENCOUNTER — Ambulatory Visit: Payer: Medicare Other | Admitting: Physician Assistant

## 2015-07-03 ENCOUNTER — Ambulatory Visit: Payer: Medicare Other | Admitting: Cardiovascular Disease

## 2015-07-03 DIAGNOSIS — I4891 Unspecified atrial fibrillation: Secondary | ICD-10-CM | POA: Diagnosis not present

## 2015-07-03 DIAGNOSIS — Z5181 Encounter for therapeutic drug level monitoring: Secondary | ICD-10-CM | POA: Diagnosis not present

## 2015-07-03 LAB — POCT INR: INR: 2.2

## 2015-07-19 ENCOUNTER — Other Ambulatory Visit: Payer: Self-pay | Admitting: Cardiovascular Disease

## 2015-07-30 NOTE — Progress Notes (Signed)
Patient ID: Pamela Whitaker, female   DOB: 17-Jun-1932, 82 y.o.   MRN: [id number]   Pamela Whitaker is seen in f/u today She has chronic afib Failed Multaq and amiodarone in past. . Managed with rate control and anticoagulation with coumadin. She has had a reduced EF of 30% in the past.   She says she has not fallen since she was last here. Still very unsteady due to this deformity of her feet. Still forgets her evening dose of Metoprolol. Still just using a cane and not a walker as advised. Has multiple questions about her rhythm. Does not understand the reason for the way her atrial fib is managed.   Primary decreased coreg to 6.25 bid for dizzyness Might be better Husband died 06/20/2013   Discussed changing to novel agent but she gets confused  HR elevated Coreg can make her sleepy Trying to go to water aerobics  Doesn't like Dr Ernestine Conrad  Her husband died under his care and she routinely waits over 1.5 hrs to see him     ROS: Denies fever, malais, weight loss, blurry vision, decreased visual acuity, cough, sputum, SOB, hemoptysis, pleuritic pain, palpitaitons, heartburn, abdominal pain, melena, lower extremity edema, claudication, or rash.  All other systems reviewed and negative  General: Affect appropriate Healthy:  appears stated age 21: normal Neck supple with no adenopathy JVP normal no bruits no thyromegaly Lungs clear with no wheezing and good diaphragmatic motion Heart:  S1/S2 no murmur, no rub, gallop or click PMI normal Abdomen: benighn, BS positve, no tenderness, no AAA no bruit.  No HSM or HJR Distal pulses intact with no bruits No edema Neuro non-focal Skin warm and dry No muscular weakness   Current Outpatient Prescriptions  Medication Sig Dispense Refill  . carvedilol (COREG) 6.25 MG tablet TAKE 1 TABLET BY MOUTH TWICE A DAY WITH A MEAL 60 tablet 1  . Cholecalciferol (VITAMIN D) 2000 UNITS tablet Take 2,000 Units by mouth daily. Does not take regularly    .  Multiple Vitamin (MULTIVITAMIN) capsule Take 1 capsule by mouth daily. Women's Centrum    . VENTOLIN HFA 108 (90 BASE) MCG/ACT inhaler Inhale 1-2 puffs into the lungs every 4 (four) hours as needed for wheezing or shortness of breath.     . warfarin (COUMADIN) 2.5 MG tablet TAKE AS DIRECTED BY COUMADIN CLINIC 35 tablet 3   No current facility-administered medications for this visit.    Allergies  Amiodarone; Lipitor; and Multaq  Electrocardiogram:  05/31/14   afib rate 79 nonspecific ST/T wave changes  08/06/15  afib rate 99 LAD ICLBBB   Assessment and Plan  Afib: increase coreg to 12.5mg  in am and 6.25 in pm Anticoagulation  Continue coumadin no bleeding issues Asthma. No active wheezing uses Breath-Right strips at night  Primary - will try to refer to Dr Sharlet Salina with [facility]

## 2015-08-03 ENCOUNTER — Encounter: Payer: Self-pay | Admitting: Cardiovascular Disease

## 2015-08-06 ENCOUNTER — Ambulatory Visit (INDEPENDENT_AMBULATORY_CARE_PROVIDER_SITE_OTHER): Payer: Medicare Other | Admitting: Cardiovascular Disease

## 2015-08-06 ENCOUNTER — Ambulatory Visit (INDEPENDENT_AMBULATORY_CARE_PROVIDER_SITE_OTHER): Payer: Medicare Other | Admitting: Pharmacist

## 2015-08-06 ENCOUNTER — Encounter: Payer: Self-pay | Admitting: Cardiovascular Disease

## 2015-08-06 VITALS — BP 140/80 | HR 78 | Ht 66.0 in | Wt 147.0 lb

## 2015-08-06 DIAGNOSIS — I10 Essential (primary) hypertension: Secondary | ICD-10-CM | POA: Diagnosis not present

## 2015-08-06 DIAGNOSIS — Z5181 Encounter for therapeutic drug level monitoring: Secondary | ICD-10-CM

## 2015-08-06 DIAGNOSIS — I4891 Unspecified atrial fibrillation: Secondary | ICD-10-CM

## 2015-08-06 DIAGNOSIS — I482 Chronic atrial fibrillation, unspecified: Secondary | ICD-10-CM

## 2015-08-06 LAB — POCT INR: INR: 2

## 2015-08-06 MED ORDER — CARVEDILOL 6.25 MG PO TABS: ORAL_TABLET | ORAL | Status: DC

## 2015-08-06 NOTE — Patient Instructions (Addendum)
Medication Instructions:  Your physician has recommended you make the following change in your medication:  1-INCREASE Coreg 12.5 mg by mouth in AM and 6.25 mg by mouth in PM   Labwork: NONE  Testing/Procedures: NONE  Follow-Up: Your physician recommends that you schedule a follow-up appointment next available with Dr. Johnsie Cancel.    If you need a refill on your cardiac medications before your next appointment, please call your pharmacy.

## 2015-08-07 ENCOUNTER — Ambulatory Visit (INDEPENDENT_AMBULATORY_CARE_PROVIDER_SITE_OTHER): Payer: Medicare Other | Admitting: Podiatry

## 2015-08-07 ENCOUNTER — Other Ambulatory Visit: Payer: Self-pay | Admitting: Internal Medicine

## 2015-08-07 ENCOUNTER — Encounter: Payer: Self-pay | Admitting: Podiatry

## 2015-08-07 DIAGNOSIS — M19071 Primary osteoarthritis, right ankle and foot: Secondary | ICD-10-CM

## 2015-08-07 DIAGNOSIS — M6789 Other specified disorders of synovium and tendon, multiple sites: Secondary | ICD-10-CM | POA: Diagnosis not present

## 2015-08-07 DIAGNOSIS — M76829 Posterior tibial tendinitis, unspecified leg: Secondary | ICD-10-CM

## 2015-08-07 MED ORDER — DICLOFENAC SODIUM 1 % TD GEL: 4.0000 g | Freq: Four times a day (QID) | TRANSDERMAL | Status: DC

## 2015-08-07 NOTE — Progress Notes (Signed)
She presents today for follow-up of painful arthritis dorsal medial aspect of the right foot. She states that it didn't really well after the injection for quite a while. She cannot take anti-inflammatories because of her Coumadin so she takes Tylenol.  Objective: Vital signs are stable alert and oriented 3. Swelling with erythema overlying the first and second metatarsocuneiform joints. Pain on palpation to this area.  Assessment: Osteoarthritis also medial aspect right foot.  Plan: I reinjected today with dexamethasone and local anesthetic. Also started her on Voltaren gel and I will follow-up with her in 2 months

## 2015-08-16 ENCOUNTER — Telehealth: Payer: Self-pay

## 2015-08-16 NOTE — Telephone Encounter (Signed)
Patient's daughter called in about her mother. Patient's daughter stated that patient has been feeling down, weak and dizzy. Patient's daughter thinks it is due to her medication change. Informed daughter that I would call her mother and talk with her directly.   Called patient about her daughter's concern. Patient stated she was feeling weak and dizzy. Patient stated she read up on her symptoms and her condition and realized she had not been drinking enough water. Patient stated she has been feeling a little down about a recent death in the family. Patient stated last night she drank plenty of fluids and felt so much better. Encouraged patient to make sure she drinks plenty of fluids and eats a healthy diet. Asked patient to check her BP and HR, and to call if it is low. Patient stated she would also call back if symptoms come back or do not improve with drinking fluids and a healthy diet.

## 2015-08-17 ENCOUNTER — Telehealth: Payer: Self-pay | Admitting: *Deleted

## 2015-08-17 ENCOUNTER — Telehealth: Payer: Self-pay | Admitting: Cardiovascular Disease

## 2015-08-17 NOTE — Telephone Encounter (Signed)
Called patient back- see phone note 08/16/15

## 2015-08-17 NOTE — Progress Notes (Signed)
Patient ID: Pamela Whitaker, female   DOB: [birth year], 82 y.o.   MRN: [id number] PA for voltaren gel received and faxed to [facility] [place] [id number]

## 2015-08-17 NOTE — Telephone Encounter (Signed)
Patient called back to inform the office of her BP and HR. BP 118/60 and HR 92. Informed patient that her BP and HR are in normal limits. Patient stated now that she is eating better and keeping hydrated that she feels a lot better. Encouraged patient to call with any other concerns or questions.

## 2015-08-17 NOTE — Telephone Encounter (Signed)
New message  Pt returned the call

## 2015-08-17 NOTE — Telephone Encounter (Signed)
Patient left a message on the refill voicemail stating that she was returning your call. She can be reached at [phone].

## 2015-08-20 ENCOUNTER — Telehealth: Payer: Self-pay | Admitting: *Deleted

## 2015-08-20 NOTE — Telephone Encounter (Signed)
PRIOR AUTHORIZATION FOR VOLTAREN 1% GEL - [id number], VALID THROUGH 05/25/2016.

## 2015-08-31 ENCOUNTER — Telehealth: Payer: Self-pay | Admitting: *Deleted

## 2015-08-31 NOTE — Telephone Encounter (Signed)
Faxed [employer] required form for Voltaren gel 1%.

## 2015-09-04 ENCOUNTER — Ambulatory Visit (INDEPENDENT_AMBULATORY_CARE_PROVIDER_SITE_OTHER): Payer: Medicare Other | Admitting: Pharmacist

## 2015-09-04 DIAGNOSIS — I482 Chronic atrial fibrillation, unspecified: Secondary | ICD-10-CM

## 2015-09-04 DIAGNOSIS — Z5181 Encounter for therapeutic drug level monitoring: Secondary | ICD-10-CM | POA: Diagnosis not present

## 2015-09-04 DIAGNOSIS — I4891 Unspecified atrial fibrillation: Secondary | ICD-10-CM | POA: Diagnosis not present

## 2015-09-04 LAB — POCT INR: INR: 2

## 2015-09-18 ENCOUNTER — Encounter: Payer: Self-pay | Admitting: Physical Therapy

## 2015-09-18 ENCOUNTER — Ambulatory Visit: Payer: Medicare Other | Attending: Physician Assistant | Admitting: Physical Therapy

## 2015-09-18 DIAGNOSIS — R296 Repeated falls: Secondary | ICD-10-CM | POA: Diagnosis not present

## 2015-09-18 DIAGNOSIS — M25561 Pain in right knee: Secondary | ICD-10-CM | POA: Diagnosis present

## 2015-09-18 DIAGNOSIS — M6281 Muscle weakness (generalized): Secondary | ICD-10-CM | POA: Diagnosis present

## 2015-09-18 DIAGNOSIS — R262 Difficulty in walking, not elsewhere classified: Secondary | ICD-10-CM | POA: Diagnosis present

## 2015-09-18 DIAGNOSIS — R293 Abnormal posture: Secondary | ICD-10-CM | POA: Insufficient documentation

## 2015-09-18 NOTE — Therapy (Signed)
[place], [place], [address] Phone: [phone]   Fax:  [phone]  Physical Therapy Evaluation  Patient Details  Name: ARIEA AZZARA MRN: [id number] Date of Birth: [birth year] Referring Provider: Dr Hector Shade   Encounter Date: 09/18/2015      PT End of Session - 09/18/15 1616    Visit Number 1   Number of Visits 16   Date for PT Re-Evaluation 11/13/15   Authorization Type UHC MCR complete    Authorization Time Period No visit limit    PT Start Time 1015   PT Stop Time 1100   PT Time Calculation (min) 45 min   Activity Tolerance Patient tolerated treatment well   Behavior During Therapy [facility] for tasks assessed/performed      Past Medical History  Diagnosis Date  . Atrial fibrillation ([place])     treated with multaq x 6 mos in 2011....[name]=2 (Age; + CHF; no CVA; no DM2). Echo 05/24/10: EF 30% mild AS(mean 51mmgHg); mild LAE; mod LAE; PASP 35 Systolic CHF  . Cardiomyopathy     a. tachy mediated Myoview 06/06/10:  EF 31%; no scar or ischemia; EF up to 50% per echo November 2013  . Breast cancer ([place])      in 1987 with a left  mastectomy  . Osteoporosis     history of bone chips in the right knee,previous right fibular fracture, operated on by Dr. Veverly Fells  . RLS (restless legs syndrome)   . Lumbar stenosis   . Hypercholesteremia   . DJD (degenerative joint disease) of knee     right knee  . Falls   . Hypertension   . Vitamin D deficiency   . Diverticulosis     Past Surgical History  Procedure Laterality Date  . Knee arthroscopy  2005  . Mastectomy, radical  1987    left breast  . Breast reconstruction  1988    left breast  . Wrist fracture surgery      left wrist  . Cardioversion  2012    There were no vitals filed for this visit.       Subjective Assessment - 09/18/15 1035    Subjective Pt reports her pain today is about a 4/10. The arthritis in her foot can reach a 4 to 5/10.     Patient is accompained by: Family member   Pertinent History Pt has a long history of knee pain. She recently had gel shots ( unspecified) 2 days ago. She does not notice any difference in her knees. She comes in today using 2 canes. she reports she feels more safe with the 2 canes. She has frequent falls 2nd to what she feels like is ankle and knee instability. She lives alone.    Limitations Sitting;Standing;Walking   How long can you walk comfortably? Patient can walk but needs support.    Diagnostic tests Not in computer but patient reports significant arthritis per x-ray    Patient Stated Goals Improve balance and strength in B LE.    Currently in Pain? Yes   Pain Score 4    Pain Location Knee   Pain Orientation Right            [facility] PT Assessment - 09/18/15 0001    Assessment   Medical Diagnosis right knee steoarthritis    Referring Provider Dr Hector Shade    Onset Date/Surgical Date --  > then a year   Hand Dominance Right  Next MD Visit --  sometime in June    Prior Therapy --  for ankle    Precautions   Precautions None   Restrictions   Weight Bearing Restrictions No   Balance Screen   Has the patient fallen in the past 6 months Yes   How many times? 3   Has the patient had a decrease in activity level because of a fear of falling?  No   Is the patient reluctant to leave their home because of a fear of falling?  No   Home Environment   Additional Comments 2 steps form the garage and 2 stpes into the front door with a rail.    Prior Function   Level of Independence Independent with household mobility with device   Leisure walking for exercise    Cognition   Overall Cognitive Status Within Functional Limits for tasks assessed   Observation/Other Assessments   Observations significant B knee valgus    Sensation   Additional Comments Pt densies parasthesias    Coordination   Gross Motor Movements are Fluid and Coordinated Yes   AROM   AROM Assessment Site  Knee   Right/Left Knee Right;Left   Left Knee Extension 7   Left Knee Flexion --  full w/ pain at end range    PROM   Overall PROM Comments full passive flexion but pain at end range    PROM Assessment Site Knee   Right/Left Knee Left   Left Knee Extension 4   Left Knee Flexion WNL    Strength   Overall Strength Comments R hip flexion 3/5 L 4+/5 R knee extesnion 4+/5  all other motions 5/5    Strength Assessment Site Knee   Palpation   Patella mobility limited superior /inferior movement of the R patella    Palpation comment No tenderness to palpation    Special Tests    Special Tests Knee Special Tests   Knee Special tests  --  patella crepitus noted with knee flex/ext    Ambulation/Gait   Gait Comments Significant valgus mobvement of R knee with ambulation R ankle turns in; right sided toe out; Patient ambulates with 2 point gait pattern with 2 point cane.    Balance   Balance Assessed Yes  narrow base of support: min a for balance    Static Standing Balance   Static Standing - Comment/# of Minutes Poor balance with NBOS eyes open and closed; Poor balance with normal base of support with eyes closed                           PT Education - 09/18/15 1615    Education provided Yes   Education Details Pt given HEP and educated on symptom mangement    Person(s) Educated Patient   Methods Explanation;Demonstration;Tactile cues;Verbal cues;Handout   Comprehension Verbalized understanding;Returned demonstration          PT Short Term Goals - 09/18/15 1624    PT SHORT TERM GOAL #1   Title "Independent with initial HEP   Time 4   Period Weeks   Status New   PT SHORT TERM GOAL #2   Title Pt will increase static stand balance to good    PT SHORT TERM GOAL #3   Title Pt will increase dynamic standing balance to fair    Time 4   Period Weeks   Status New   PT SHORT TERM GOAL #4  Title Pt will increase right LE gross strength to 5/5    Time 4   Period  Weeks   Status New   PT SHORT TERM GOAL #5   Title Pt will perform BERG and set long term goal for reduction of fall risk   Time 4   Period Weeks   Status On-going           PT Long Term Goals - 09-30-2015 1626    PT LONG TERM GOAL #1   Title Pt will reduce FOTO limitation to 48 %    Period Weeks   Status New   PT LONG TERM GOAL #2   Title Patient will report no pain in right knee with mabulation    Time 8   Period Weeks   Status On-going   PT LONG TERM GOAL #3   Title Pt will demosntrate good dynamic balance in order to increase safety               Plan - 2015-09-30 1617    Clinical Impression Statement Pt is an 82 year old female with R knee instability with gait. She has had several falls over the past 6 months. She has decreased static and dynamic standing balance. She also has instability in B ankles. She is motivated to improve her strength and stability. She would benefit from skilled therapy to adress the above deficits. She was seen for a  medium complexity evaluation 2nd to multiple co-morbidities balance deficits and leg weakness.    Rehab Potential Fair   Clinical Impairments Affecting Rehab Potential Valgus deformity of the R knee, frequent falls   PT Frequency 2x / week   PT Duration 8 weeks   PT Treatment/Interventions ADLs/Self Care Home Management;Cryotherapy;Lobbyist;Therapeutic exercise;Therapeutic activities;Functional mobility training;Ultrasound;Neuromuscular re-education;Patient/family education;Manual techniques;Taping;Energy conservation   PT Next Visit Plan add standing hip exercises; Begin balance exercises with gait belt; patient will require BERG testing to asssess fall risk.    PT Home Exercise Plan ankle 4 way w/ yellow t-band; SAQ; SLR,    Consulted and Agree with Plan of Care Patient      Patient will benefit from skilled therapeutic intervention in order to improve the following deficits and impairments:   Abnormal gait, Decreased activity tolerance, Decreased balance, Decreased mobility, Difficulty walking, Impaired flexibility, Decreased endurance, Pain  Visit Diagnosis: Repeated falls  Muscle weakness (generalized)  Difficulty in walking, not elsewhere classified  Abnormal posture      G-Codes - 09/30/2015 1630    Functional Assessment Tool Used FOTO, clinical decision making; objective measures   Functional Limitation Mobility: Walking and moving around   Mobility: Walking and Moving Around Current Status [phone]) At least 60 percent but less than 80 percent impaired, limited or restricted   Mobility: Walking and Moving Around Goal Status [phone]) At least 20 percent but less than 40 percent impaired, limited or restricted       Problem List Patient Active Problem List   Diagnosis Date Noted  . Medication management 03/20/2015  . Mixed hyperlipidemia 03/20/2015  . Encounter for Medicare annual wellness exam 12/06/2014  . Breast cancer ([place]) 06/29/2014  . Diverticulosis   . Prediabetes 08/18/2013  . Encounter for therapeutic drug monitoring 06/23/2013  . Hypertension   . Vitamin D deficiency   . Spinal stenosis, lumbar region, with neurogenic claudication 03/28/2013    Class: Chronic  . At high risk for falls 02/23/2013  . Systolic heart failure ([place]) 06/07/2010  . Congestive dilated cardiomyopathy (  [place]) 05/29/2010  . Atrial fibrillation ([place]) 04/16/2009  . Elevated lipids 04/13/2009  . RESTLESS LEGS SYNDROME 04/13/2009  . Osteoarthritis 04/13/2009    Carney Living PT DPT  09/18/2015, 4:31 PM  [facility] [address] [place], [place], [address] Phone: [phone]   Fax:  [phone]  Name: RHYA HACKWORTH MRN: [id number] Date of Birth: 1932-11-07

## 2015-09-19 ENCOUNTER — Ambulatory Visit: Payer: Medicare Other | Admitting: Physical Therapy

## 2015-09-19 DIAGNOSIS — R262 Difficulty in walking, not elsewhere classified: Secondary | ICD-10-CM

## 2015-09-19 DIAGNOSIS — M6281 Muscle weakness (generalized): Secondary | ICD-10-CM

## 2015-09-19 DIAGNOSIS — R296 Repeated falls: Secondary | ICD-10-CM | POA: Diagnosis not present

## 2015-09-19 DIAGNOSIS — R293 Abnormal posture: Secondary | ICD-10-CM

## 2015-09-19 DIAGNOSIS — M25561 Pain in right knee: Secondary | ICD-10-CM

## 2015-09-19 NOTE — Therapy (Signed)
[place], [place], [address] Phone: [phone]   Fax:  [phone]  Physical Therapy Treatment  Patient Details  Name: Pamela Whitaker MRN: [id number] Date of Birth: [birth year] Referring Provider: Dr Hector Shade   Encounter Date: 09/19/2015      PT End of Session - 09/18/15 1616    Visit Number 1   Number of Visits 16   Date for PT Re-Evaluation 11/13/15   Authorization Type UHC MCR complete    Authorization Time Period No visit limit    PT Start Time 1015   PT Stop Time 1100   PT Time Calculation (min) 45 min   Activity Tolerance Patient tolerated treatment well   Behavior During Therapy [facility] for tasks assessed/performed      Past Medical History  Diagnosis Date  . Atrial fibrillation ([place])     treated with multaq x 6 mos in 2011....[name]=2 (Age; + CHF; no CVA; no DM2). Echo 05/24/10: EF 30% mild AS(mean 50mmgHg); mild LAE; mod LAE; PASP 35 Systolic CHF  . Cardiomyopathy     a. tachy mediated Myoview 06/06/10:  EF 31%; no scar or ischemia; EF up to 50% per echo November 2013  . Breast cancer ([place])      in 1987 with a left  mastectomy  . Osteoporosis     history of bone chips in the right knee,previous right fibular fracture, operated on by Dr. Veverly Fells  . RLS (restless legs syndrome)   . Lumbar stenosis   . Hypercholesteremia   . DJD (degenerative joint disease) of knee     right knee  . Falls   . Hypertension   . Vitamin D deficiency   . Diverticulosis     Past Surgical History  Procedure Laterality Date  . Knee arthroscopy  2005  . Mastectomy, radical  1987    left breast  . Breast reconstruction  1988    left breast  . Wrist fracture surgery      left wrist  . Cardioversion  2012    There were no vitals filed for this visit.      Subjective Assessment - 09/19/15 1358    Subjective Pateint reports the pain isn her knee is only about a 1-2. She also has some pain in her foot.  She had been doing her exercises. She    Pertinent History Pt has a long history of knee pain. She recently had gel shots ( unspecified) 2 days ago. She does not notice any difference in her knees. She comes in today using 2 canes. she reports she feels more safe with the 2 canes. She has frequent falls 2nd to what she feels like is ankle and knee instability. She lives alone.    Limitations Sitting;Standing;Walking   How long can you walk comfortably? Patient can walk but needs support.    Diagnostic tests Not in computer but patient reports significant arthritis per x-ray    Patient Stated Goals Improve balance and strength in B LE.                          [facility] Adult PT Treatment/Exercise - 09/19/15 0001    High Level Balance   High Level Balance Comments normal base of support eyes open//closed 30 sec each NBOS eyes open/eyes closed 2x10; tandem stance x30 sec each leg  all with gait belt and min A; weight shift onto air-ex 2x10 B  Knee/Hip Exercises: Aerobic   Nustep 5   Knee/Hip Exercises: Standing   Heel Raises 2 sets;10 reps   Hip Flexion 2 sets;10 reps   Hip ADduction 10 reps   Hip ADduction Limitations R only    Knee/Hip Exercises: Seated   Long Arc Quad 2 sets;10 reps   Long Arc Quad Limitations yellow t-band    Knee/Hip Flexion knee flexion Y t-band 2x10                 PT Education - 2015-10-14 1615    Education provided Yes   Education Details Pt given HEP and educated on symptom mangement    Person(s) Educated Patient   Methods Explanation;Demonstration;Tactile cues;Verbal cues;Handout   Comprehension Verbalized understanding;Returned demonstration          PT Short Term Goals - 10/14/15 1624    PT SHORT TERM GOAL #1   Title "Independent with initial HEP   Time 4   Period Weeks   Status New   PT SHORT TERM GOAL #2   Title Pt will increase static stand balance to good    PT SHORT TERM GOAL #3   Title Pt will increase dynamic standing  balance to fair    Time 4   Period Weeks   Status New   PT SHORT TERM GOAL #4   Title Pt will increase right LE gross strength to 5/5    Time 4   Period Weeks   Status New   PT SHORT TERM GOAL #5   Title Pt will perform BERG and set long term goal for reduction of fall risk   Time 4   Period Weeks   Status On-going           PT Long Term Goals - 10/14/2015 1626    PT LONG TERM GOAL #1   Title Pt will reduce FOTO limitation to 48 %    Period Weeks   Status New   PT LONG TERM GOAL #2   Title Patient will report no pain in right knee with mabulation    Time 8   Period Weeks   Status On-going   PT LONG TERM GOAL #3   Title Pt will demosntrate good dynamic balance in order to increase safety               Plan - 09/19/15 1407    Clinical Impression Statement Pt contineus to have ankle istability on the R which is likely making her R knee valgus worse. She tolerated treatment well. She reported fatigue after treatment but no significant pain. She required min a for balance activity.    Clinical Impairments Affecting Rehab Potential Valgus deformity of the R knee, frequent falls   PT Frequency 2x / week   PT Duration 8 weeks   PT Next Visit Plan add standing hip exercises; Begin balance exercises with gait belt; patient will require BERG testing to asssess fall risk.    PT Home Exercise Plan ankle 4 way w/ yellow t-band; SAQ; SLR,       Patient will benefit from skilled therapeutic intervention in order to improve the following deficits and impairments:  Abnormal gait, Decreased activity tolerance, Decreased balance, Decreased mobility, Difficulty walking, Impaired flexibility, Decreased endurance, Pain  Visit Diagnosis: Muscle weakness (generalized)  Difficulty in walking, not elsewhere classified  Repeated falls  Abnormal posture  Pain in right knee       G-Codes - 10-14-15 1630    Functional Assessment Tool Used  FOTO, clinical decision making; objective  measures   Functional Limitation Mobility: Walking and moving around   Mobility: Walking and Moving Around Current Status [phone]) At least 60 percent but less than 80 percent impaired, limited or restricted   Mobility: Walking and Moving Around Goal Status [phone]) At least 20 percent but less than 40 percent impaired, limited or restricted      Problem List Patient Active Problem List   Diagnosis Date Noted  . Medication management 03/20/2015  . Mixed hyperlipidemia 03/20/2015  . Encounter for Medicare annual wellness exam 12/06/2014  . Breast cancer ([place]) 06/29/2014  . Diverticulosis   . Prediabetes 08/18/2013  . Encounter for therapeutic drug monitoring 06/23/2013  . Hypertension   . Vitamin D deficiency   . Spinal stenosis, lumbar region, with neurogenic claudication 03/28/2013    Class: Chronic  . At high risk for falls 02/23/2013  . Systolic heart failure ([place]) 06/07/2010  . Congestive dilated cardiomyopathy ([place]) 05/29/2010  . Atrial fibrillation ([place]) 04/16/2009  . Elevated lipids 04/13/2009  . RESTLESS LEGS SYNDROME 04/13/2009  . Osteoarthritis 04/13/2009    There-ex: to improve strength and stability  Neuro re-ed: to improve balance and stability and to decrease falls.  Carney Living PT DPT  09/19/2015, 2:13 PM  [facility] [address] [place], [place], [address] Phone: [phone]   Fax:  [phone]  Name: Pamela Whitaker MRN: [id number] Date of Birth: [birth year]

## 2015-09-25 ENCOUNTER — Encounter: Payer: Self-pay | Admitting: *Deleted

## 2015-09-25 ENCOUNTER — Ambulatory Visit: Payer: Medicare Other | Attending: Physician Assistant | Admitting: Physical Therapy

## 2015-09-25 DIAGNOSIS — M25561 Pain in right knee: Secondary | ICD-10-CM | POA: Diagnosis present

## 2015-09-25 DIAGNOSIS — M6281 Muscle weakness (generalized): Secondary | ICD-10-CM | POA: Insufficient documentation

## 2015-09-25 DIAGNOSIS — R262 Difficulty in walking, not elsewhere classified: Secondary | ICD-10-CM | POA: Diagnosis present

## 2015-09-25 DIAGNOSIS — R293 Abnormal posture: Secondary | ICD-10-CM | POA: Diagnosis present

## 2015-09-25 DIAGNOSIS — R296 Repeated falls: Secondary | ICD-10-CM | POA: Diagnosis present

## 2015-09-25 NOTE — Therapy (Signed)
[place], [place], [address] Phone: [phone]   Fax:  [phone]  Physical Therapy Treatment  Patient Details  Name: Pamela Whitaker MRN: [id number] Date of Birth: 01-11-1933 Referring Provider: Dr Hector Shade   Encounter Date: 09/25/2015      PT End of Session - 09/25/15 1755    Visit Number 2   Number of Visits 16   Date for PT Re-Evaluation 11/13/15   Authorization Type UHC MCR complete    Authorization Time Period No visit limit    PT Start Time 1100   PT Stop Time 1145   PT Time Calculation (min) 45 min   Activity Tolerance Patient tolerated treatment well   Behavior During Therapy [facility] for tasks assessed/performed      Past Medical History  Diagnosis Date  . Atrial fibrillation ([place])     treated with multaq x 6 mos in 2011....[name]=2 (Age; + CHF; no CVA; no DM2). Echo 05/24/10: EF 30% mild AS(mean 29mmgHg); mild LAE; mod LAE; PASP 35 Systolic CHF  . Cardiomyopathy     a. tachy mediated Myoview 06/06/10:  EF 31%; no scar or ischemia; EF up to 50% per echo November 2013  . Breast cancer ([place])      in 1987 with a left  mastectomy  . Osteoporosis     history of bone chips in the right knee,previous right fibular fracture, operated on by Dr. Veverly Fells  . RLS (restless legs syndrome)   . Lumbar stenosis   . Hypercholesteremia   . DJD (degenerative joint disease) of knee     right knee  . Falls   . Hypertension   . Vitamin D deficiency   . Diverticulosis     Past Surgical History  Procedure Laterality Date  . Knee arthroscopy  2005  . Mastectomy, radical  1987    left breast  . Breast reconstruction  1988    left breast  . Wrist fracture surgery      left wrist  . Cardioversion  2012    There were no vitals filed for this visit.      Subjective Assessment - 09/25/15 1754    Subjective Patient was sore after the last visit but she reported she felt like it was good soreness. No  new functional improvmenets at this time.   Patient is accompained by: Family member   Pertinent History Pt has a long history of knee pain. She recently had gel shots ( unspecified) 2 days ago. She does not notice any difference in her knees. She comes in today using 2 canes. she reports she feels more safe with the 2 canes. She has frequent falls 2nd to what she feels like is ankle and knee instability. She lives alone.    Limitations Sitting;Standing;Walking   How long can you walk comfortably? Patient can walk but needs support.    Diagnostic tests Not in computer but patient reports significant arthritis per x-ray    Patient Stated Goals Improve balance and strength in B LE.             [facility] PT Assessment - 09/25/15 0001    Balance   Balance Assessed Yes   Standardized Balance Assessment   Standardized Balance Assessment Berg Balance Test   Berg Balance Test   Sit to Stand Able to stand using hands after several tries   Standing Unsupported Able to stand 2 minutes with supervision   Sitting with Back Unsupported but  Feet Supported on Floor or Stool Able to sit safely and securely 2 minutes   Stand to Sit Uses backs of legs against chair to control descent   Transfers Needs one person to assist   Standing Unsupported with Eyes Closed Able to stand 10 seconds with supervision   Standing Ubsupported with Feet Together Able to place feet together independently but unable to hold for 30 seconds   From Standing, Reach Forward with Outstretched Arm Can reach forward >12 cm safely (5")   From Standing Position, Pick up Object from Floor Unable to try/needs assist to keep balance   From Standing Position, Turn to Look Behind Over each Shoulder Needs assist to keep from losing balance and falling   Turn 360 Degrees Needs assistance while turning   Standing Unsupported, Alternately Place Feet on Step/Stool Needs assistance to keep from falling or unable to try   Standing Unsupported, One  Foot in [employer] balance while stepping or standing   Standing on One Leg Unable to try or needs assist to prevent fall   Total Score 20   Berg comment: high fall risk                      [facility] Adult PT Treatment/Exercise - 09/25/15 0001    High Level Balance   High Level Balance Comments normal base of support eyes open//closed 30 sec each NBOS eyes open/eyes closed 2x10; tandem stance x30 sec each leg  all with gait belt and min A; weight shift onto air-ex 2x10 B                PT Education - 09/25/15 1754    Education Details edcated patient on the results of her BERg balance scale           PT Short Term Goals - 09/25/15 1758    PT SHORT TERM GOAL #1   Title "Independent with initial HEP   Baseline working on her exercises and going to the pool   Time 4   Period Weeks   Status Achieved   PT SHORT TERM GOAL #2   Title Pt will increase static stand balance to good    Baseline still poor without device    Time 4   Period Weeks   Status On-going   PT SHORT TERM GOAL #3   Title Pt will increase dynamic standing balance to fair    Time 4   Period Weeks   Status On-going   PT SHORT TERM GOAL #4   Title Pt will increase right LE gross strength to 5/5    Baseline continues to work on [place]   Status New   PT SHORT TERM GOAL #5   Title Pt will perform BERG and set long term goal for reduction of fall risk   Baseline performed on 09/25/15    Time 4   Period Weeks   Status Achieved           PT Long Term Goals - 09/25/15 1800    PT LONG TERM GOAL #1   Title Pt will reduce FOTO limitation to 48 %    Time 8   Period Weeks   Status On-going   PT LONG TERM GOAL #2   Title Patient will report no pain in right knee with ambulation    Time 8   Period Weeks   Status On-going   PT LONG TERM GOAL #3  Title Pt will demosntrate good dynamic balance in order to increase safety   Time 8   Period Weeks   Status On-going   PT LONG  TERM GOAL #4   Title Pt will be able to tolerate standing with assistive device with pain 3/10 or less in order to shop for groceries   Time 8   Status On-going   PT LONG TERM GOAL #5   Title Pt will be able to single limb stance on left leg for 30 seconds with UE support with shoe orthotic and with or without brace  to improve gait and reduce fall risk   Time 8   Period Weeks   Status Revised   Additional Long Term Goals   Additional Long Term Goals Yes   PT LONG TERM GOAL #6   Title Pt will improve BERG to at least 40/56 from 20/56 to reduce risk of fall using assistive device   Time 8   Period Weeks   Status New               Plan - 09/25/15 1757    Clinical Impression Statement Performed BERG balance scale with the patient today. she is a high fall risk at this time. Itr was noted and the patient was educated on the fact that she is a high fall risk when she turns her head, or her body to the R. She is more stable to the left. Therapy also worked on her ability to stand from a chair.    Rehab Potential Fair   Clinical Impairments Affecting Rehab Potential Valgus deformity of the R knee, frequent falls   PT Frequency 2x / week   PT Duration 8 weeks   PT Treatment/Interventions ADLs/Self Care Home Management;Cryotherapy;Lobbyist;Therapeutic exercise;Therapeutic activities;Functional mobility training;Ultrasound;Neuromuscular re-education;Patient/family education;Manual techniques;Taping;Energy conservation   PT Next Visit Plan add standing hip exercises; Begin balance exercises with gait belt; patient will require BERG testing to asssess fall risk.    PT Home Exercise Plan ankle 4 way w/ yellow t-band; SAQ; SLR,    Consulted and Agree with Plan of Care Patient      Patient will benefit from skilled therapeutic intervention in order to improve the following deficits and impairments:  Abnormal gait, Decreased activity tolerance, Decreased balance,  Decreased mobility, Difficulty walking, Impaired flexibility, Decreased endurance, Pain  Visit Diagnosis: Muscle weakness (generalized)  Difficulty in walking, not elsewhere classified  Repeated falls  Abnormal posture  Pain in right knee     Problem List Patient Active Problem List   Diagnosis Date Noted  . Medication management 03/20/2015  . Mixed hyperlipidemia 03/20/2015  . Encounter for Medicare annual wellness exam 12/06/2014  . Breast cancer ([place]) 06/29/2014  . Diverticulosis   . Prediabetes 08/18/2013  . Encounter for therapeutic drug monitoring 06/23/2013  . Hypertension   . Vitamin D deficiency   . Spinal stenosis, lumbar region, with neurogenic claudication 03/28/2013    Class: Chronic  . At high risk for falls 02/23/2013  . Systolic heart failure ([place]) 06/07/2010  . Congestive dilated cardiomyopathy ([place]) 05/29/2010  . Atrial fibrillation ([place]) 04/16/2009  . Elevated lipids 04/13/2009  . RESTLESS LEGS SYNDROME 04/13/2009  . Osteoarthritis 04/13/2009    Pamela Whitaker PT DPT  09/25/2015, 6:03 PM  [facility] [address] [place], [place], [address] Phone: [phone]   Fax:  [phone]  Name: Pamela Whitaker MRN: [id number] Date of Birth: 10/23/1932

## 2015-09-27 ENCOUNTER — Ambulatory Visit: Payer: Medicare Other | Admitting: Physical Therapy

## 2015-09-27 DIAGNOSIS — M25561 Pain in right knee: Secondary | ICD-10-CM

## 2015-09-27 DIAGNOSIS — R262 Difficulty in walking, not elsewhere classified: Secondary | ICD-10-CM

## 2015-09-27 DIAGNOSIS — R293 Abnormal posture: Secondary | ICD-10-CM

## 2015-09-27 DIAGNOSIS — M6281 Muscle weakness (generalized): Secondary | ICD-10-CM

## 2015-09-27 DIAGNOSIS — R296 Repeated falls: Secondary | ICD-10-CM

## 2015-09-27 NOTE — Progress Notes (Signed)
Patient ID: Pamela Whitaker, female   DOB: 07/09/1932, 82 y.o.   MRN: [id number]   [name] is seen in f/u today She has chronic afib Failed Multaq and amiodarone in past. . Managed with rate control and anticoagulation with coumadin. She has had a reduced EF of 30% in the past.   She says she has not fallen since she was last here. Still very unsteady due to this deformity of her feet. Still forgets her evening dose of Metoprolol. Still just using a cane and not a walker as advised. Has multiple questions about her rhythm. Does not understand the reason for the way her atrial fib is managed.   Primary decreased coreg to 6.25 bid for dizzyness Might be better Pamela Whitaker died Jun 10, 2013   Discussed changing to novel agent but she gets confused  HR elevated Coreg can make her sleepy Trying to go to water aerobics  Doesn't like Dr Ernestine Conrad  Her Pamela Whitaker died under his care and she routinely waits over 1.5 hrs to see him  Wants to change to NOAC as her knees are bad and its hard to get in for her INR appts    ROS: Denies fever, malais, weight loss, blurry vision, decreased visual acuity, cough, sputum, SOB, hemoptysis, pleuritic pain, palpitaitons, heartburn, abdominal pain, melena, lower extremity edema, claudication, or rash.  All other systems reviewed and negative  General: Affect appropriate Healthy:  appears stated age 64: normal Neck supple with no adenopathy JVP normal no bruits no thyromegaly Lungs clear with no wheezing and good diaphragmatic motion Heart:  S1/S2 no murmur, no rub, gallop or click PMI normal Abdomen: benighn, BS positve, no tenderness, no AAA no bruit.  No HSM or HJR Distal pulses intact with no bruits No edema Neuro non-focal Skin warm and dry No muscular weakness   Current Outpatient Prescriptions  Medication Sig Dispense Refill  . carvedilol (COREG) 6.25 MG tablet TAKE 12.5 mg 2 tablets by mouth in AM and take 6.25 mg 1 tablet by mouth in PM 90 tablet  11  . Cholecalciferol (VITAMIN D) 2000 UNITS tablet Take 2,000 Units by mouth daily. Does not take regularly    . diclofenac sodium (VOLTAREN) 1 % GEL Apply 4 g topically 4 (four) times daily. 100 g 4  . Multiple Vitamin (MULTIVITAMIN) capsule Take 1 capsule by mouth daily. Women's Centrum    . warfarin (COUMADIN) 2.5 MG tablet TAKE AS DIRECTED BY COUMADIN CLINIC 35 tablet 3   No current facility-administered medications for this visit.    Allergies  Amiodarone; Lipitor; and Multaq  Electrocardiogram:  05/31/14   afib rate 79 nonspecific ST/T wave changes  08/06/15  afib rate 99 LAD ICLBBB   Assessment and Plan  Afib: better rate control on higher dose coreg  BMET/INR today f/u coumadin clinic to change to xarelto 20 mg Anticoagulation  See above changing to NOAC Asthma. No active wheezing uses Breath-Right strips at night  Primary - will try to refer to Dr Sharlet Salina with [facility]: has had 3 injections to right knee f/u Alusio consider TKR   Jenkins Rouge

## 2015-09-28 ENCOUNTER — Ambulatory Visit (INDEPENDENT_AMBULATORY_CARE_PROVIDER_SITE_OTHER): Payer: Medicare Other

## 2015-09-28 ENCOUNTER — Telehealth: Payer: Self-pay

## 2015-09-28 ENCOUNTER — Ambulatory Visit (INDEPENDENT_AMBULATORY_CARE_PROVIDER_SITE_OTHER): Payer: Medicare Other | Admitting: Cardiovascular Disease

## 2015-09-28 ENCOUNTER — Encounter: Payer: Self-pay | Admitting: Cardiovascular Disease

## 2015-09-28 VITALS — BP 140/70 | HR 75 | Ht 65.0 in | Wt 150.8 lb

## 2015-09-28 DIAGNOSIS — I4891 Unspecified atrial fibrillation: Secondary | ICD-10-CM

## 2015-09-28 DIAGNOSIS — I482 Chronic atrial fibrillation, unspecified: Secondary | ICD-10-CM

## 2015-09-28 DIAGNOSIS — Z79899 Other long term (current) drug therapy: Secondary | ICD-10-CM | POA: Diagnosis not present

## 2015-09-28 DIAGNOSIS — Z5181 Encounter for therapeutic drug level monitoring: Secondary | ICD-10-CM

## 2015-09-28 DIAGNOSIS — I10 Essential (primary) hypertension: Secondary | ICD-10-CM

## 2015-09-28 LAB — BASIC METABOLIC PANEL
BUN: 11 mg/dL (ref 7–25)
CALCIUM: 9.4 mg/dL (ref 8.6–10.4)
CO2: 30 mmol/L (ref 20–31)
CREATININE: 0.7 mg/dL (ref 0.60–0.88)
Chloride: 105 mmol/L (ref 98–110)
GLUCOSE: 87 mg/dL (ref 65–99)
POTASSIUM: 4.1 mmol/L (ref 3.5–5.3)
SODIUM: 139 mmol/L (ref 135–146)

## 2015-09-28 LAB — POCT INR: INR: 1.7

## 2015-09-28 MED ORDER — RIVAROXABAN 20 MG PO TABS: 20.0000 mg | ORAL_TABLET | Freq: Every day | ORAL | Status: DC

## 2015-09-28 NOTE — Therapy (Signed)
[place], [place], [address] Phone: [phone]   Fax:  [phone]  Physical Therapy Treatment  Patient Details  Name: Pamela Whitaker MRN: [id number] Date of Birth: [birth year] Referring Provider: Dr Hector Shade   Encounter Date: 09/27/2015      PT End of Session - 09/28/15 1132    Visit Number 3   Number of Visits 16   PT Start Time [id number]   PT Stop Time 1430   PT Time Calculation (min) 45 min   Activity Tolerance Patient limited by pain   Behavior During Therapy [facility] for tasks assessed/performed      Past Medical History  Diagnosis Date  . Atrial fibrillation ([place])     treated with multaq x 6 mos in 2011....[name]=2 (Age; + CHF; no CVA; no DM2). Echo 05/24/10: EF 30% mild AS(mean 68mmgHg); mild LAE; mod LAE; PASP 35 Systolic CHF  . Cardiomyopathy     a. tachy mediated Myoview 06/06/10:  EF 31%; no scar or ischemia; EF up to 50% per echo November 2013  . Breast cancer ([place])      in 1987 with a left  mastectomy  . Osteoporosis     history of bone chips in the right knee,previous right fibular fracture, operated on by Dr. Veverly Fells  . RLS (restless legs syndrome)   . Lumbar stenosis   . Hypercholesteremia   . DJD (degenerative joint disease) of knee     right knee  . Falls   . Hypertension   . Vitamin D deficiency   . Diverticulosis     Past Surgical History  Procedure Laterality Date  . Knee arthroscopy  2005  . Mastectomy, radical  1987    left breast  . Breast reconstruction  1988    left breast  . Wrist fracture surgery      left wrist  . Cardioversion  2012    There were no vitals filed for this visit.      Subjective Assessment - 09/27/15 1731    Subjective (p) Pt had no soreness after the last visit. She has no complaints. She is doing well today.    Pertinent History (p) Pt has a long history of knee pain. She recently had gel shots ( unspecified) 2 days ago. She does not  notice any difference in her knees. She comes in today using 2 canes. she reports she feels more safe with the 2 canes. She has frequent falls 2nd to what she feels like is ankle and knee instability. She lives alone.    Limitations (p) Sitting;Standing;Walking   How long can you walk comfortably? (p) Patient can walk but needs support.    Diagnostic tests (p) Not in computer but patient reports significant arthritis per x-ray    Patient Stated Goals (p) Improve balance and strength in B LE.    Currently in Pain? (p) Yes   Pain Score (p) 4    Pain Location (p) Knee   Pain Orientation (p) Right                                 PT Education - 09/28/15 1132    Education Details continue with exercises at home    Person(s) Educated Patient   Methods Explanation   Comprehension Verbalized understanding          PT Short Term Goals - 09/25/15 1758  PT SHORT TERM GOAL #1   Title "Independent with initial HEP   Baseline working on her exercises and going to the pool   Time 4   Period Weeks   Status Achieved   PT SHORT TERM GOAL #2   Title Pt will increase static stand balance to good    Baseline still poor without device    Time 4   Period Weeks   Status On-going   PT SHORT TERM GOAL #3   Title Pt will increase dynamic standing balance to fair    Time 4   Period Weeks   Status On-going   PT SHORT TERM GOAL #4   Title Pt will increase right LE gross strength to 5/5    Baseline continues to work on [place]   Status New   PT [place] #5   Title Pt will perform BERG and set long term goal for reduction of fall risk   Baseline performed on 09/25/15    Time 4   Period Weeks   Status Achieved           PT Long Term Goals - 09/25/15 1800    PT LONG TERM GOAL #1   Title Pt will reduce FOTO limitation to 48 %    Time 8   Period Weeks   Status On-going   PT LONG TERM GOAL #2   Title Patient will report no pain in right knee  with ambulation    Time 8   Period Weeks   Status On-going   PT LONG TERM GOAL #3   Title Pt will demosntrate good dynamic balance in order to increase safety   Time 8   Period Weeks   Status On-going   PT LONG TERM GOAL #4   Title Pt will be able to tolerate standing with assistive device with pain 3/10 or less in order to shop for groceries   Time 8   Status On-going   PT LONG TERM GOAL #5   Title Pt will be able to single limb stance on left leg for 30 seconds with UE support with shoe orthotic and with or without brace  to improve gait and reduce fall risk   Time 8   Period Weeks   Status Revised   Additional Long Term Goals   Additional Long Term Goals Yes   PT LONG TERM GOAL #6   Title Pt will improve BERG to at least 40/56 from 20/56 to reduce risk of fall using assistive device   Time 8   Period Weeks   Status New               Plan - 09/28/15 1132    Clinical Impression Statement Pateint continues to tolerate treatment well. She is motivated to perform exercises. She required less assist for balance activity.    Rehab Potential Fair   Clinical Impairments Affecting Rehab Potential Valgus deformity of the R knee, frequent falls   PT Frequency 2x / week   PT Duration 8 weeks   PT Treatment/Interventions ADLs/Self Care Home Management;Cryotherapy;Lobbyist;Therapeutic exercise;Therapeutic activities;Functional mobility training;Ultrasound;Neuromuscular re-education;Patient/family education;Manual techniques;Taping;Energy conservation   PT Next Visit Plan add standing hip exercises; Begin balance exercises with gait belt; patient will require BERG testing to asssess fall risk.    PT Home Exercise Plan ankle 4 way w/ yellow t-band; SAQ; SLR,       Patient will benefit from skilled therapeutic intervention in order to  improve the following deficits and impairments:  Abnormal gait, Decreased activity tolerance, Decreased balance,  Decreased mobility, Difficulty walking, Impaired flexibility, Decreased endurance, Pain  Visit Diagnosis: Muscle weakness (generalized)  Difficulty in walking, not elsewhere classified  Repeated falls  Abnormal posture  Pain in right knee     Problem List Patient Active Problem List   Diagnosis Date Noted  . Medication management 03/20/2015  . Mixed hyperlipidemia 03/20/2015  . Encounter for Medicare annual wellness exam 12/06/2014  . Breast cancer ([place]) 06/29/2014  . Diverticulosis   . Prediabetes 08/18/2013  . Encounter for therapeutic drug monitoring 06/23/2013  . Hypertension   . Vitamin D deficiency   . Spinal stenosis, lumbar region, with neurogenic claudication 03/28/2013    Class: Chronic  . At high risk for falls 02/23/2013  . Systolic heart failure ([place]) 06/07/2010  . Congestive dilated cardiomyopathy ([place]) 05/29/2010  . Atrial fibrillation ([place]) 04/16/2009  . Elevated lipids 04/13/2009  . RESTLESS LEGS SYNDROME 04/13/2009  . Osteoarthritis 04/13/2009    Carney Living PT DPT  09/28/2015, 11:35 AM  [facility] [address] [place], [place], [address] Phone: [phone]   Fax:  [phone]  Name: Pamela Whitaker MRN: [id number] Date of Birth: 17-Feb-1933

## 2015-09-28 NOTE — Telephone Encounter (Signed)
Xarelto 20mg  approved by [facility]. Through 05/25/2016. PA- [id number].

## 2015-09-28 NOTE — Patient Instructions (Addendum)
Medication Instructions:  Your physician has recommended you make the following change in your medication:  1-START xarelto 20 mg by mouth daily 2-STOP Coumadin as directed by Coumadin clinic  Labwork: Your physician recommends that you have lab work today. BMET  Testing/Procedures: NONE  Follow-Up: Your physician wants you to follow-up in: 6 months with Dr. Johnsie Cancel.  You will receive a reminder letter in the mail two months in advance. If you don't receive a letter, please call our office to schedule the follow-up appointment.   If you need a refill on your cardiac medications before your next appointment, please call your pharmacy.

## 2015-09-28 NOTE — Patient Instructions (Signed)

## 2015-10-02 ENCOUNTER — Ambulatory Visit: Payer: Medicare Other | Admitting: Physical Therapy

## 2015-10-02 DIAGNOSIS — M6281 Muscle weakness (generalized): Secondary | ICD-10-CM

## 2015-10-02 DIAGNOSIS — R293 Abnormal posture: Secondary | ICD-10-CM

## 2015-10-02 DIAGNOSIS — R296 Repeated falls: Secondary | ICD-10-CM

## 2015-10-02 DIAGNOSIS — R262 Difficulty in walking, not elsewhere classified: Secondary | ICD-10-CM

## 2015-10-02 DIAGNOSIS — M25561 Pain in right knee: Secondary | ICD-10-CM

## 2015-10-02 NOTE — Therapy (Signed)
[place], [place], [address] Phone: [phone]   Fax:  [phone]  Physical Therapy Treatment  Patient Details  Name: Pamela Whitaker MRN: [id number] Date of Birth: [birth year] Referring Provider: Dr Hector Shade   Encounter Date: 10/02/2015      PT End of Session - 10/02/15 1931    Visit Number 4   Number of Visits 16   Date for PT Re-Evaluation 11/13/15   Authorization Type UHC MCR complete    Authorization Time Period No visit limit    PT Start Time 1100   PT Stop Time 1155   PT Time Calculation (min) 55 min   Activity Tolerance Patient limited by pain   Behavior During Therapy [facility] for tasks assessed/performed      Past Medical History  Diagnosis Date  . Atrial fibrillation ([place])     treated with multaq x 6 mos in 2011....[name]=2 (Age; + CHF; no CVA; no DM2). Echo 05/24/10: EF 30% mild AS(mean 37mmgHg); mild LAE; mod LAE; PASP 35 Systolic CHF  . Cardiomyopathy     a. tachy mediated Myoview 06/06/10:  EF 31%; no scar or ischemia; EF up to 50% per echo November 2013  . Breast cancer ([place])      in 1987 with a left  mastectomy  . Osteoporosis     history of bone chips in the right knee,previous right fibular fracture, operated on by Dr. Veverly Fells  . RLS (restless legs syndrome)   . Lumbar stenosis   . Hypercholesteremia   . DJD (degenerative joint disease) of knee     right knee  . Falls   . Hypertension   . Vitamin D deficiency   . Diverticulosis     Past Surgical History  Procedure Laterality Date  . Knee arthroscopy  2005  . Mastectomy, radical  1987    left breast  . Breast reconstruction  1988    left breast  . Wrist fracture surgery      left wrist  . Cardioversion  2012    There were no vitals filed for this visit.                       [facility] Adult PT Treatment/Exercise - 10/02/15 0001    High Level Balance   High Level Balance Comments normal base of  support eyes open//closed 30 sec each NBOS eyes open/eyes closed 2x10; tandem stance x30 sec each leg  all with gait belt and min A; weight shift onto air-ex 2x10 B   Knee/Hip Exercises: Aerobic   Nustep 5   Knee/Hip Exercises: Standing   Heel Raises 2 sets;10 reps   Hip Flexion 2 sets;10 reps   Hip ADduction 10 reps   Hip ADduction Limitations R only    Knee/Hip Exercises: Seated   Long Arc Quad 2 sets;10 reps   Long Arc Quad Limitations yellow t-band    Knee/Hip Flexion knee flexion Y t-band 2x10    Knee/Hip Exercises: Supine   Heel Slides Limitations 2x10 with abdominal breathing    Straight Leg Raises Limitations 1x10   Knee Flexion Limitations supine knee flexion with abdominal breathing    Modalities   Modalities --  cold pack to R knee and r foot to decrease inflammation x57m                  PT Short Term Goals - 09/25/15 1758    PT SHORT TERM GOAL #1  Title "Independent with initial HEP   Baseline working on her exercises and going to the pool   Time 4   Period Weeks   Status Achieved   PT SHORT TERM GOAL #2   Title Pt will increase static stand balance to good    Baseline still poor without device    Time 4   Period Weeks   Status On-going   PT SHORT TERM GOAL #3   Title Pt will increase dynamic standing balance to fair    Time 4   Period Weeks   Status On-going   PT SHORT TERM GOAL #4   Title Pt will increase right LE gross strength to 5/5    Baseline continues to work on [place]   Status New   PT [place] #5   Title Pt will perform BERG and set long term goal for reduction of fall risk   Baseline performed on 09/25/15    Time 4   Period Weeks   Status Achieved           PT Long Term Goals - 09/25/15 1800    PT LONG TERM GOAL #1   Title Pt will reduce FOTO limitation to 48 %    Time 8   Period Weeks   Status On-going   PT LONG TERM GOAL #2   Title Patient will report no pain in right knee with ambulation     Time 8   Period Weeks   Status On-going   PT LONG TERM GOAL #3   Title Pt will demosntrate good dynamic balance in order to increase safety   Time 8   Period Weeks   Status On-going   PT LONG TERM GOAL #4   Title Pt will be able to tolerate standing with assistive device with pain 3/10 or less in order to shop for groceries   Time 8   Status On-going   PT LONG TERM GOAL #5   Title Pt will be able to single limb stance on left leg for 30 seconds with UE support with shoe orthotic and with or without brace  to improve gait and reduce fall risk   Time 8   Period Weeks   Status Revised   Additional Long Term Goals   Additional Long Term Goals Yes   PT LONG TERM GOAL #6   Title Pt will improve BERG to at least 40/56 from 20/56 to reduce risk of fall using assistive device   Time 8   Period Weeks   Status New               Plan - 10/02/15 1933    Clinical Impression Statement Pt required less assistance for balance exercises. She feels like when she exercises she has improved stride length. She is tolerating exercises well. She would like to progress back to her aquatics program. She is having less pain in her knee but she is still having pain when she wakes up in the morning.    Rehab Potential Fair   Clinical Impairments Affecting Rehab Potential Valgus deformity of the R knee, frequent falls   PT Frequency 2x / week   PT Treatment/Interventions ADLs/Self Care Home Management;Cryotherapy;Lobbyist;Therapeutic exercise;Therapeutic activities;Functional mobility training;Ultrasound;Neuromuscular re-education;Patient/family education;Manual techniques;Taping;Energy conservation   PT Next Visit Plan add standing hip exercises; Begin balance exercises with gait belt; patient will require BERG testing to asssess fall risk.    PT Home Exercise Plan ankle 4  way w/ yellow t-band; SAQ; SLR,    Consulted and Agree with Plan of Care Patient      Patient  will benefit from skilled therapeutic intervention in order to improve the following deficits and impairments:  Abnormal gait, Decreased activity tolerance, Decreased balance, Decreased mobility, Difficulty walking, Impaired flexibility, Decreased endurance, Pain  Visit Diagnosis: Muscle weakness (generalized)  Difficulty in walking, not elsewhere classified  Repeated falls  Abnormal posture  Pain in right knee  Neuro re-ed: to improve balance and decrease fall risk There-ex: to improve bilateral lower extremity stability and decrease left knee pain.   Problem List Patient Active Problem List   Diagnosis Date Noted  . Medication management 03/20/2015  . Mixed hyperlipidemia 03/20/2015  . Encounter for Medicare annual wellness exam 12/06/2014  . Breast cancer ([place]) 06/29/2014  . Diverticulosis   . Prediabetes 08/18/2013  . Encounter for therapeutic drug monitoring 06/23/2013  . Hypertension   . Vitamin D deficiency   . Spinal stenosis, lumbar region, with neurogenic claudication 03/28/2013    Class: Chronic  . At high risk for falls 02/23/2013  . Systolic heart failure ([place]) 06/07/2010  . Congestive dilated cardiomyopathy ([place]) 05/29/2010  . Atrial fibrillation ([place]) 04/16/2009  . Elevated lipids 04/13/2009  . RESTLESS LEGS SYNDROME 04/13/2009  . Osteoarthritis 04/13/2009    Carney Living PT DPT  10/02/2015, 7:54 PM  [facility] [address] [place], [place], [address] Phone: [phone]   Fax:  [phone]  Name: CHRISTMAS STUTEVILLE MRN: [id number] Date of Birth: 05-21-1933

## 2015-10-04 ENCOUNTER — Ambulatory Visit: Payer: Medicare Other | Admitting: Physical Therapy

## 2015-10-04 DIAGNOSIS — M6281 Muscle weakness (generalized): Secondary | ICD-10-CM

## 2015-10-04 DIAGNOSIS — M25561 Pain in right knee: Secondary | ICD-10-CM

## 2015-10-04 DIAGNOSIS — R296 Repeated falls: Secondary | ICD-10-CM

## 2015-10-04 DIAGNOSIS — R293 Abnormal posture: Secondary | ICD-10-CM

## 2015-10-04 DIAGNOSIS — R262 Difficulty in walking, not elsewhere classified: Secondary | ICD-10-CM

## 2015-10-04 NOTE — Therapy (Addendum)
[place], [place], [address] Phone: [phone]   Fax:  [phone]  Physical Therapy Treatment  Patient Details  Name: Pamela Whitaker MRN: [id number] Date of Birth: 1932/09/28 Referring Provider: Dr Hector Shade   Encounter Date: 10/04/2015      PT End of Session - 10/04/15 1622    Visit Number 5   Number of Visits 16   Date for PT Re-Evaluation 11/13/15   Authorization Type UHC MCR complete    Authorization Time Period No visit limit    PT Start Time 1023   PT Stop Time 1011   PT Time Calculation (min) 1428 min   Activity Tolerance Patient tolerated treatment well      Past Medical History  Diagnosis Date  . Atrial fibrillation ([place])     treated with multaq x 6 mos in 2011....[name]=2 (Age; + CHF; no CVA; no DM2). Echo 05/24/10: EF 30% mild AS(mean 30mgHg); mild LAE; mod LAE; PASP 35 Systolic CHF  . Cardiomyopathy     a. tachy mediated Myoview 06/06/10:  EF 31%; no scar or ischemia; EF up to 50% per echo November 2013  . Breast cancer ([place]      in 1987 with a left  mastectomy  . Osteoporosis     history of bone chips in the right knee,previous right fibular fracture, operated on by Dr. NVeverly Fells . RLS (restless legs syndrome)   . Lumbar stenosis   . Hypercholesteremia   . DJD (degenerative joint disease) of knee     right knee  . Falls   . Hypertension   . Vitamin D deficiency   . Diverticulosis     Past Surgical History  Procedure Laterality Date  . Knee arthroscopy  2005  . Mastectomy, radical  1987    left breast  . Breast reconstruction  1988    left breast  . Wrist fracture surgery      left wrist  . Cardioversion  2012    There were no vitals filed for this visit.      Subjective Assessment - 10/04/15 1620    Subjective Patient was 8 minutes late for her appointment. she reports some soreness in her knee this morning.    Pertinent History Pt has a long history of knee  pain. She recently had gel shots ( unspecified) 2 days ago. She does not notice any difference in her knees. She comes in today using 2 canes. she reports she feels more safe with the 2 canes. She has frequent falls 2nd to what she feels like is ankle and knee instability. She lives alone.    Limitations Sitting;Standing;Walking   How long can you walk comfortably? Patient can walk but needs support.    Diagnostic tests Not in computer but patient reports significant arthritis per x-ray    Patient Stated Goals Improve balance and strength in B LE.    Currently in Pain? Yes   Pain Score 2    Pain Location Knee   Pain Orientation Right   Pain Descriptors / Indicators Aching   Multiple Pain Sites No                         [facility] Adult PT Treatment/Exercise - 10/04/15 0001    Knee/Hip Exercises: Aerobic   Nustep 4   Knee/Hip Exercises: Standing   Heel Raises 2 sets;10 reps   Heel Raises Limitations seated   Knee/Hip Exercises:  Seated   Long Arc Quad 2 sets;10 reps   Long Arc Quad Limitations yellow t-band    Ball Squeeze supine 2x10   Clamshell with TheraBand Red   Knee/Hip Flexion knee flexion Y t-band 2x10    Knee/Hip Exercises: Supine   Quad Sets Limitations 2x10   Short Arc Quad Sets Limitations 2x10   Heel Slides Limitations 2x10 with abdominal breathing    Straight Leg Raises Limitations 1x10   Modalities   Modalities --  cold pack to R knee and R foot to decrease inflammation x31m                 PT Short Term Goals - 10/04/15 1631    PT SHORT TERM GOAL #1   Title "Independent with initial HEP   Baseline working on her exercises and going to the pool   Time 4   Period Weeks   Status Revised   PT SHORT TERM GOAL #2   Title Pt will increase static stand balance to good    Baseline still poor without device    Time 4   Period Weeks   Status On-going   PT SHORT TERM GOAL #3   Title Pt will increase dynamic standing balance to fair    Time  4   Period Weeks   Status On-going   PT SHORT TERM GOAL #4   Title Pt will increase right LE gross strength to 5/5    Baseline continues to work on stregth    Time 4   Period Weeks   Status New   PT SHORT TERM GOAL #5   Title Pt will perform BERG and set long term goal for reduction of fall risk   Baseline performed on 09/25/15    Time 4   Period Weeks   Status Achieved           PT Long Term Goals - 09/25/15 1800    PT LONG TERM GOAL #1   Title Pt will reduce FOTO limitation to 48 %    Time 8   Period Weeks   Status On-going   PT LONG TERM GOAL #2   Title Patient will report no pain in right knee with ambulation    Time 8   Period Weeks   Status On-going   PT LONG TERM GOAL #3   Title Pt will demosntrate good dynamic balance in order to increase safety   Time 8   Period Weeks   Status On-going   PT LONG TERM GOAL #4   Title Pt will be able to tolerate standing with assistive device with pain 3/10 or less in order to shop for groceries   Time 8   Status On-going   PT LONG TERM GOAL #5   Title Pt will be able to single limb stance on left leg for 30 seconds with UE support with shoe orthotic and with or without brace  to improve gait and reduce fall risk   Time 8   Period Weeks   Status Revised   Additional Long Term Goals   Additional Long Term Goals Yes   PT LONG TERM GOAL #6   Title Pt will improve BERG to at least 40/56 from 20/56 to reduce risk of fall using assistive device   Time 8   Period Weeks   Status New       There-ex: To increase bilateral quad strength         Plan - 10/04/15 1624  Clinical Impression Statement Pt reported fatigue during the Nu-step. She reported she feels like her A-fib is flaring up. She was strongly advised to go to her doctor or at least call her doctor. She declined. She requrested to continue treatment. Therapy reviewed light supine exercises and continued to advise the patient to seek medical attention if she felt  like her heart was fltttering. She reports it happens frequently. Her B/P awas measured at 140/70. her HR was 78 but felt inconsistent. She tolerated light activity well. The patient will be going on vacation fdr a week. She reports the MD only wanted her to come for a few week.s She had been making progress. she was advised she is certified for another few weeks. She will return if MD advises her too. She would benefit from furter therapy. for balanace and knee strength.    Rehab Potential Fair   Clinical Impairments Affecting Rehab Potential Valgus deformity of the R knee, frequent falls   PT Frequency 2x / week   PT Duration 8 weeks   PT Treatment/Interventions ADLs/Self Care Home Management;Cryotherapy;Lobbyist;Therapeutic exercise;Therapeutic activities;Functional mobility training;Ultrasound;Neuromuscular re-education;Patient/family education;Manual techniques;Taping;Energy conservation   PT Next Visit Plan cotnninue to progress balance and strength. Be aware of A-fib.    PT Home Exercise Plan ankle 4 way w/ yellow t-band; SAQ; SLR, quad set, pillow squeeze, heel slide, knee flexion,    Consulted and Agree with Plan of Care Patient      Patient will benefit from skilled therapeutic intervention in order to improve the following deficits and impairments:  Abnormal gait, Decreased activity tolerance, Decreased balance, Decreased mobility, Difficulty walking, Impaired flexibility, Decreased endurance, Pain  Visit Diagnosis: Muscle weakness (generalized)  Difficulty in walking, not elsewhere classified  Repeated falls  Pain in right knee  Abnormal posture  PHYSICAL THERAPY DISCHARGE SUMMARY  Visits from Start of Care: 5  Current functional level related to goals / functional outcomes: Patient had improved   Remaining deficits: Improved symptoms. Did not return for last visit    Education / Equipment: D/C HEP  Plan: Patient agrees to discharge.   Patient goals were partially met. Patient is being discharged due to not returning since the last visit.  ?????       Problem List Patient Active Problem List   Diagnosis Date Noted  . Medication management 03/20/2015  . Mixed hyperlipidemia 03/20/2015  . Encounter for Medicare annual wellness exam 12/06/2014  . Breast cancer ([place]) 06/29/2014  . Diverticulosis   . Prediabetes 08/18/2013  . Encounter for therapeutic drug monitoring 06/23/2013  . Hypertension   . Vitamin D deficiency   . Spinal stenosis, lumbar region, with neurogenic claudication 03/28/2013    Class: Chronic  . At high risk for falls 02/23/2013  . Systolic heart failure ([place]) 06/07/2010  . Congestive dilated cardiomyopathy ([place]) 05/29/2010  . Atrial fibrillation ([place]) 04/16/2009  . Elevated lipids 04/13/2009  . RESTLESS LEGS SYNDROME 04/13/2009  . Osteoarthritis 04/13/2009    Carney Living PT DPT  10/04/2015, 4:33 PM  [facility] [address] [place], [place], [address] Phone: [phone]   Fax:  [phone]  Name: NIOMIE ENGLERT MRN: [id number] Date of Birth: [birth year]

## 2015-10-09 ENCOUNTER — Ambulatory Visit: Payer: Medicare Other | Admitting: Podiatry

## 2015-10-12 ENCOUNTER — Telehealth: Payer: Self-pay | Admitting: Cardiovascular Disease

## 2015-10-12 ENCOUNTER — Other Ambulatory Visit (INDEPENDENT_AMBULATORY_CARE_PROVIDER_SITE_OTHER): Payer: Medicare Other

## 2015-10-12 ENCOUNTER — Encounter: Payer: Self-pay | Admitting: Internal Medicine

## 2015-10-12 ENCOUNTER — Ambulatory Visit (INDEPENDENT_AMBULATORY_CARE_PROVIDER_SITE_OTHER): Payer: Medicare Other | Admitting: Internal Medicine

## 2015-10-12 VITALS — BP 136/80 | HR 78 | Temp 98.5°F | Resp 12 | Ht 64.0 in | Wt 152.0 lb

## 2015-10-12 DIAGNOSIS — I482 Chronic atrial fibrillation, unspecified: Secondary | ICD-10-CM

## 2015-10-12 DIAGNOSIS — M159 Polyosteoarthritis, unspecified: Secondary | ICD-10-CM

## 2015-10-12 DIAGNOSIS — R296 Repeated falls: Secondary | ICD-10-CM | POA: Diagnosis not present

## 2015-10-12 DIAGNOSIS — Z853 Personal history of malignant neoplasm of breast: Secondary | ICD-10-CM

## 2015-10-12 DIAGNOSIS — R32 Unspecified urinary incontinence: Secondary | ICD-10-CM

## 2015-10-12 DIAGNOSIS — Z9181 History of falling: Secondary | ICD-10-CM

## 2015-10-12 DIAGNOSIS — E785 Hyperlipidemia, unspecified: Secondary | ICD-10-CM

## 2015-10-12 DIAGNOSIS — I10 Essential (primary) hypertension: Secondary | ICD-10-CM | POA: Diagnosis not present

## 2015-10-12 DIAGNOSIS — N393 Stress incontinence (female) (male): Secondary | ICD-10-CM

## 2015-10-12 LAB — URINALYSIS, ROUTINE W REFLEX MICROSCOPIC
BILIRUBIN URINE: NEGATIVE
KETONES UR: NEGATIVE
LEUKOCYTES UA: NEGATIVE
Nitrite: NEGATIVE
PH: 6 (ref 5.0–8.0)
Specific Gravity, Urine: 1.02 (ref 1.000–1.030)
Total Protein, Urine: NEGATIVE
URINE GLUCOSE: NEGATIVE
UROBILINOGEN UA: 0.2 (ref 0.0–1.0)

## 2015-10-12 NOTE — Progress Notes (Signed)
Pre visit review using our clinic review tool, if applicable. No additional management support is needed unless otherwise documented below in the visit note. 

## 2015-10-12 NOTE — Telephone Encounter (Signed)
Follow Up ° °Pt returned call//  °

## 2015-10-12 NOTE — Telephone Encounter (Signed)
Left message for patient to call back  

## 2015-10-12 NOTE — Progress Notes (Signed)
   Subjective:    Patient ID: Pamela Whitaker, female    DOB: Oct 22, 1932, 82 y.o.   MRN: [id number]  HPI The patient is a new 82 YO female coming in for transfer of care of her medical conditions. She is not able to trust her previous PCP due to her husband dying under his care. She did not feel she was listened to and did not get adequate care there. She does not like to take medications and takes only those she needs to. She does have hypertension (controlled on coreg without side effects and not complicated) and arthritis  (takes rare tylenol OTC with good relief, sometimes limits her activity, no recent falls), and her hx breast cancer (follows with oncology still and yearly breast exam plus mammogram most recently normal, had in 1980s with removal and breast reconstruction with some hormonal therapy afterwards). She is not having any new complaints today. She is doing well overall per her standards. She does have some family nearby and sees them often. She does have falls in her history and uses walker exclusively at home and is supposed to use it while out as well. She only has a cane with her today and is not steady while walking.   PMH, [facility], social history reviewed and updated.   Review of Systems  Constitutional: Positive for activity change. Negative for fever, chills, appetite change, fatigue and unexpected weight change.  HENT: Negative.   Eyes: Negative.   Respiratory: Negative for cough, chest tightness, shortness of breath and wheezing.   Cardiovascular: Negative for chest pain, palpitations and leg swelling.  Gastrointestinal: Negative for nausea, abdominal pain, diarrhea, constipation, blood in stool and abdominal distention.  Musculoskeletal: Positive for arthralgias and gait problem. Negative for myalgias and back pain.  Skin: Negative.   Neurological: Negative.   Psychiatric/Behavioral: Negative.       Objective:   Physical Exam  Constitutional: She is oriented to person,  place, and time. She appears well-developed and well-nourished.  HENT:  Head: Normocephalic and atraumatic.  Eyes: EOM are normal.  Neck: Normal range of motion.  Cardiovascular: Normal rate.   irreg irreg   Pulmonary/Chest: Effort normal and breath sounds normal. No respiratory distress. She has no wheezes. She has no rales.  Abdominal: Soft. Bowel sounds are normal. She exhibits no distension. There is no tenderness. There is no rebound.  Musculoskeletal: She exhibits no edema.  Neurological: She is alert and oriented to person, place, and time. Coordination abnormal.  Unsteady even with the cane and needs assistance in walking in the office  Skin: Skin is warm and dry.  Psychiatric: She has a normal mood and affect.   Filed Vitals:   10/12/15 1420  BP: 136/80  Pulse: 78  Temp: 98.5 F (36.9 C)  TempSrc: Oral  Resp: 12  Height: 5\' 4"  (1.626 m)  Weight: 152 lb (68.947 kg)  SpO2: 97%      Assessment & Plan:

## 2015-10-12 NOTE — Patient Instructions (Signed)
We will check the urine today for infections.   Below are instructions about working on the bladder muscles to help with that.   Come back once a year for a check up and please feel free to call us sooner if needed.   Kegel Exercises The goal of Kegel exercises is to isolate and exercise your pelvic floor muscles. These muscles act as a hammock that supports the rectum, vagina, small intestine, and uterus. As the muscles weaken, the hammock sags and these organs are displaced from their normal positions. Kegel exercises can strengthen your pelvic floor muscles and help you to improve bladder and bowel control, improve sexual response, and help reduce many problems and some discomfort during pregnancy. Kegel exercises can be done anywhere and at any time. HOW TO PERFORM KEGEL EXERCISES 1. Locate your pelvic floor muscles. To do this, squeeze (contract) the muscles that you use when you try to stop the flow of urine. You will feel a tightness in the vaginal area (women) and a tight lift in the rectal area (men and women). 2. When you begin, contract your pelvic muscles tight for 2-5 seconds, then relax them for 2-5 seconds. This is one set. Do 4-5 sets with a short pause in between. 3. Contract your pelvic muscles for 8-10 seconds, then relax them for 8-10 seconds. Do 4-5 sets. If you cannot contract your pelvic muscles for 8-10 seconds, try 5-7 seconds and work your way up to 8-10 seconds. Your goal is 4-5 sets of 10 contractions each day. Keep your stomach, buttocks, and legs relaxed during the exercises. Perform sets of both short and long contractions. Vary your positions. Perform these contractions 3-4 times per day. Perform sets while you are:   Lying in bed in the morning.  Standing at lunch.  Sitting in the late afternoon.  Lying in bed at night. You should do 40-50 contractions per day. Do not perform more Kegel exercises per day than recommended. Overexercising can cause muscle  fatigue. Continue these exercises for for at least 15-20 weeks or as directed by your caregiver.   This information is not intended to replace advice given to you by your health care provider. Make sure you discuss any questions you have with your health care provider.   Document Released: 04/28/2012 Document Revised: 06/02/2014 Document Reviewed: 04/28/2012 Elsevier Interactive Patient Education [employer].

## 2015-10-14 ENCOUNTER — Encounter: Payer: Self-pay | Admitting: Internal Medicine

## 2015-10-14 DIAGNOSIS — R32 Unspecified urinary incontinence: Secondary | ICD-10-CM | POA: Insufficient documentation

## 2015-10-14 NOTE — Assessment & Plan Note (Signed)
Seeing cardiology and rate controlled and xarelto for anticoagulation. Appropriate. No bleeding to suggest need for change.

## 2015-10-14 NOTE — Assessment & Plan Note (Signed)
She is reminded to use walker at all times due to her unsteadiness in the office today with cane. She usually uses the walker at home but does not like using it.

## 2015-10-14 NOTE — Assessment & Plan Note (Signed)
Taking coreg and BP at goal, reviewed recent labs and no indication for change today. HR okay.

## 2015-10-14 NOTE — Assessment & Plan Note (Signed)
No recurrence and seeing oncology yearly still. Not on treatment.

## 2015-10-14 NOTE — Assessment & Plan Note (Signed)
Likely stress incontinence and she does not want to start meds at this time. Given kegel exercises to use to see if this improves her symptoms. Checking U/A for signs of infection.

## 2015-10-14 NOTE — Assessment & Plan Note (Signed)
Has had bad reaction with statins in the past and does not want to go back on therapy although most recent lipid would indicate this is indicated for her.

## 2015-10-14 NOTE — Assessment & Plan Note (Signed)
Takes OTC meds rarely and does okay with pain. This does limit her walking and activity and she may think about pursuing surgery but does not want to do that.

## 2015-10-16 ENCOUNTER — Ambulatory Visit (INDEPENDENT_AMBULATORY_CARE_PROVIDER_SITE_OTHER): Payer: Medicare Other | Admitting: Podiatry

## 2015-10-16 ENCOUNTER — Encounter: Payer: Self-pay | Admitting: Podiatry

## 2015-10-16 VITALS — BP 138/77 | HR 69 | Resp 12

## 2015-10-16 DIAGNOSIS — M6789 Other specified disorders of synovium and tendon, multiple sites: Secondary | ICD-10-CM

## 2015-10-16 DIAGNOSIS — M76829 Posterior tibial tendinitis, unspecified leg: Secondary | ICD-10-CM

## 2015-10-16 NOTE — Progress Notes (Signed)
She presents today for follow-up of her posterior tibial tendinitis and her osteoarthritis. She states that is doing much better and she is happy that it is improved.  Objective: Vital signs are stable alert and oriented 3. Pulses are palpable. No reproducible pain on palpation of the posterior tibial tendon or of the talonavicular joint or Lisfranc's joints.  Assessment: Resolving posterior tibial tendinitis and osteoarthritis.  Plan: Follow up with me as needed.

## 2015-11-01 ENCOUNTER — Ambulatory Visit (INDEPENDENT_AMBULATORY_CARE_PROVIDER_SITE_OTHER): Payer: Medicare Other | Admitting: Surgery

## 2015-11-01 DIAGNOSIS — I4891 Unspecified atrial fibrillation: Secondary | ICD-10-CM | POA: Diagnosis not present

## 2015-11-01 LAB — CBC
HCT: 40.2 % (ref 35.0–45.0)
Hemoglobin: 13.7 g/dL (ref 11.7–15.5)
MCH: 30.7 pg (ref 27.0–33.0)
MCHC: 34.1 g/dL (ref 32.0–36.0)
MCV: 90.1 fL (ref 80.0–100.0)
MPV: 9.5 fL (ref 7.5–12.5)
PLATELETS: 136 10*3/uL — AB (ref 140–400)
RBC: 4.46 MIL/uL (ref 3.80–5.10)
RDW: 13.5 % (ref 11.0–15.0)
WBC: 7.5 10*3/uL (ref 3.8–10.8)

## 2015-11-01 LAB — BASIC METABOLIC PANEL
BUN: 11 mg/dL (ref 7–25)
CHLORIDE: 104 mmol/L (ref 98–110)
CO2: 25 mmol/L (ref 20–31)
CREATININE: 0.73 mg/dL (ref 0.60–0.88)
Calcium: 9.4 mg/dL (ref 8.6–10.4)
Glucose, Bld: 91 mg/dL (ref 65–99)
POTASSIUM: 4.5 mmol/L (ref 3.5–5.3)
Sodium: 140 mmol/L (ref 135–146)

## 2015-11-01 NOTE — Progress Notes (Signed)
Pt was started on Xarelto 20 mg daily on 09/28/15 due to a hx of atrial fibrillation .    Reviewed patients medication list.  Pt not currently on any combined P-gp and strong CYP3A4 inhibitors/inducers (ketoconazole, traconazole, ritonavir, carbamazepine, phenytoin, rifampin, St. John's wort).  Reviewed labs.  SCr 0.73, Weight 153lbs, CrCl- 66.22 ml/min.  Dose appropriate based on CrCl.   Hgb and HCT 13.7/40.2.   A full discussion of the nature of anticoagulants has been carried out.  A benefit/risk analysis has been presented to the patient, so that they understand the justification for choosing anticoagulation with Xarelto at this time.  The need for compliance is stressed.  Pt is aware to take the medication once daily with the largest meal of the day.  Side effects of potential bleeding are discussed, including unusual colored urine or stools, coughing up blood or coffee ground emesis, nose bleeds or serious fall or head trauma.  Discussed signs and symptoms of stroke. The patient should avoid any OTC items containing aspirin or ibuprofen.  Avoid alcohol consumption.   Call if any signs of abnormal bleeding.  Discussed financial obligations and resolved any difficulty in obtaining medication.  Next lab test test in 6 months on 05/01/16.

## 2015-11-02 ENCOUNTER — Telehealth: Payer: Self-pay | Admitting: Cardiovascular Disease

## 2015-11-02 NOTE — Telephone Encounter (Signed)
Called patient back with lab results. Patient is aware of lab results.

## 2015-11-02 NOTE — Telephone Encounter (Signed)
Follow-up ° ° ° ° °The pt is returning the nurses call °

## 2016-01-09 ENCOUNTER — Ambulatory Visit (HOSPITAL_COMMUNITY)
Admission: RE | Admit: 2016-01-09 | Discharge: 2016-01-09 | Disposition: A | Discharge status: Home / Self Care | Payer: Medicare Other | Source: Ambulatory Visit | Attending: Hematology & Oncology | Admitting: Hematology & Oncology

## 2016-01-09 DIAGNOSIS — Z7901 Long term (current) use of anticoagulants: Secondary | ICD-10-CM | POA: Diagnosis not present

## 2016-01-09 DIAGNOSIS — E559 Vitamin D deficiency, unspecified: Secondary | ICD-10-CM | POA: Diagnosis present

## 2016-01-09 DIAGNOSIS — M810 Age-related osteoporosis without current pathological fracture: Secondary | ICD-10-CM | POA: Diagnosis not present

## 2016-01-09 DIAGNOSIS — C50912 Malignant neoplasm of unspecified site of left female breast: Secondary | ICD-10-CM

## 2016-01-15 ENCOUNTER — Telehealth (HOSPITAL_COMMUNITY): Payer: Self-pay | Admitting: *Deleted

## 2016-01-15 NOTE — Telephone Encounter (Signed)
Pt aware of results and aware to continue taking vit d and to get adequate calcium. Pt also aware to discuss with PCP any further treatment options.

## 2016-01-15 NOTE — Telephone Encounter (Signed)
-----   Message from Patrici Ranks, MD sent at 01/13/2016 10:09 AM EDT ----- Has osteoporosis. Same as in 2015. Stay on viatmin D, needs to ensure adequate calcium intake. Can discuss with PCP bisphosphonate therapy. Dr.P

## 2016-01-23 ENCOUNTER — Telehealth: Payer: Self-pay | Admitting: Cardiovascular Disease

## 2016-01-23 NOTE — Telephone Encounter (Signed)
New Message  Pt c/o medication issue:  1. Name of Medication: Xarelto  2. How are you currently taking this medication (dosage and times per day)? 20 mg tablet once daily  3. Are you having a reaction (difficulty breathing--STAT)? Dizzy when she gets up  4. What is your medication issue? Pt is having side effects from this medication and would like for nurse to contact her back.  Pt voiced she wanted me to reiterate for nurse to contact her back.  I offered 12.11.17 appt with Nishan and to see a PA because 12.11.17 was too far and pt declined.  Please follow up with pt. Thanks!

## 2016-01-23 NOTE — Telephone Encounter (Signed)
Left message for patient to call back  

## 2016-01-24 ENCOUNTER — Telehealth: Payer: Self-pay | Admitting: Cardiovascular Disease

## 2016-01-24 NOTE — Telephone Encounter (Signed)
New message ° ° ° ° °Returning a call to the nurse °

## 2016-01-24 NOTE — Telephone Encounter (Signed)
Left message for patient to call back  

## 2016-01-24 NOTE — Telephone Encounter (Signed)
I think she's ok if "bleeding" gets worse will need to see primary/GI Coreg dose has already been decreased once and needs it for rate control Of her afib.

## 2016-01-24 NOTE — Telephone Encounter (Signed)
Returning patient's call. Left message for patient to call back

## 2016-01-24 NOTE — Telephone Encounter (Signed)
Patient complaining of tiredness and lightheadedness in the evenings after she takes her Xarelto and Coreg. Patient does not know what her HR or BP is in the evenings. Patient also complaining of bright red blood when she has diarrhea, but at the present time patient has no bleeding. Patient thinks the diarrhea is brought on by eating chocolate. Patient is afraid that all these symptoms are side effects from Xarelto.  Patient wanted to let Dr. Johnsie Cancel know. Will forward to Dr. Johnsie Cancel for advisement.

## 2016-01-25 NOTE — Telephone Encounter (Signed)
Called patient with Dr. Nishan's recommendations. Patient verbalized understanding.  

## 2016-01-25 NOTE — Telephone Encounter (Signed)
See phone note from 01/23/16.

## 2016-01-25 NOTE — Telephone Encounter (Signed)
F/U  Patient is returning your call      

## 2016-01-25 NOTE — Telephone Encounter (Signed)
Left message for patient to call back  

## 2016-02-22 NOTE — Progress Notes (Signed)
Cardiology Office Note    Date:  02/26/2016   ID:  Pamela Whitaker, DOB [birth year], MRN [id number]  PCP:  Hoyt Koch, MD  Cardiologist:  Dr. Johnsie Cancel  CC: 6 month follow up  History of Present Illness:  Pamela Whitaker is a 82 y.o. female with a history of chronic atrial fibrillation on Xarelto, HLD,  dementia, posterior tibial tendinitis, asthma, history of NICM (EF 30%--> 50-55%) and mod TR who presents to clinic for 6 month follow up.   Previously had a NICM with EF down to 35-40% by echo in 2012. Myoview done 05/2010 and assume negative but cannot read final report. EF improved to 50-55% with mild AS, mild MR, mod TR in 03/2012.   She has chronic afib with rate control strategy. She has failed Multaq and amiodarone in the past.   She was last seen by Dr. Johnsie Cancel in 09/2015 for follow up. Patient requested to be changed a DOAC due to it being difficult to get to INR appointments. She was changed to Xarelto 20mg  daily. She had only fallen once and used her cane as directed. She is unsteady due to a deformity of her foot.   She called the office on 01/23/16 to report blood in her stool and dizziness/fatigue after taking Xarelto and Coreg. He advised her to keep on these medicines and follow up with GI/PCP if it continued.   Today she presents to clinic for follow up. She has been having a lot of issues with Xarelto and says she gets "all the side effects that are listed including jaw pain with opening her mouth, fatigue, balance issues, hacky cough, worsening insomnia and bleeding from hemorrhoids." Since starting the Xarelto she has had at least 5 cup fulls of blood in her stool that she thinks are from hemorrhoids. She does water aerobics and can't gp into the water because of the bleeding. She thinks its worse with coffee and chocolate. She did have a fall 3-4 weeks ago on the sidewalk while sweeping and she lost her balance. No CP. She has some worsening DOE. No LE edema, orthopnea  or PND. No dizziness or syncope. Overall, she just "wants to feel better and live to watch her great grandchildren grow."  She has a strong family hx of CAD. Her father had very premature CAD with a heart attack 21s.    Past Medical History:  Diagnosis Date  . Atrial fibrillation ([place])    treated with multaq x 6 mos in 2011....[name]=2 (Age; + CHF; no CVA; no DM2). Echo 05/24/10: EF 30% mild AS(mean 26mmgHg); mild LAE; mod LAE; PASP 35 Systolic CHF  . Breast cancer ([place])     in 1987 with a left  mastectomy  . Cardiomyopathy    a. tachy mediated Myoview 06/06/10:  EF 31%; no scar or ischemia; EF up to 50% per echo November 2013  . Diverticulosis   . DJD (degenerative joint disease) of knee    right knee  . Falls   . Hypercholesteremia   . Hypertension   . Lumbar stenosis   . Osteoporosis    history of bone chips in the right knee,previous right fibular fracture, operated on by Dr. Veverly Fells  . RLS (restless legs syndrome)   . Vitamin D deficiency     Past Surgical History:  Procedure Laterality Date  . BREAST RECONSTRUCTION  1988   left breast  . CARDIOVERSION  2012  . KNEE ARTHROSCOPY  2005  . MASTECTOMY, RADICAL  1987   left breast  . WRIST FRACTURE SURGERY     left wrist    Current Medications: Outpatient Medications Prior to Visit  Medication Sig Dispense Refill  . acetaminophen (TYLENOL) 500 MG tablet Take 500 mg by mouth every 6 (six) hours as needed (pt takes 1-2 tablets at night for pain).    . carvedilol (COREG) 6.25 MG tablet TAKE 12.5 mg 2 tablets by mouth in AM and take 6.25 mg 1 tablet by mouth in PM 90 tablet 11  . Cholecalciferol (VITAMIN D) 2000 UNITS tablet Take 2,000 Units by mouth daily. Does not take regularly    . diclofenac sodium (VOLTAREN) 1 % GEL Apply 4 g topically 4 (four) times daily. 100 g 4  . Multiple Vitamin (MULTIVITAMIN) capsule Take 1 capsule by mouth daily. Women's Centrum    . rivaroxaban (XARELTO) 20 MG TABS tablet Take 1 tablet (20 mg  total) by mouth daily with supper. 30 tablet 11   No facility-administered medications prior to visit.      Allergies:   Amiodarone; Lipitor [atorvastatin]; and Multaq [dronedarone]   Social History   Social History  . Marital status: Married    Spouse name: Chrissie Noa  . Number of children: 2  . Years of education: college   Occupational History  . retired Retired   Social History Main Topics  . Smoking status: Never Smoker  . Smokeless tobacco: Never Used  . Alcohol use No  . Drug use: No  . Sexual activity: No   Other Topics Concern  . None   Social History Narrative  . None     Family History:  The patient's family history includes Aortic aneurysm in her mother; Breast cancer in her sister; Diabetes in her sister; Heart attack in her father; Stomach cancer in her sister.     ROS:   Please see the history of present illness.    ROS All other systems reviewed and are negative.   PHYSICAL EXAM:   VS:  BP 138/82   Pulse 89   Ht 5\' 4"  (1.626 m)   Wt 145 lb (65.8 kg)   BMI 24.89 kg/m    GEN: Well nourished, well developed, in no acute distress  HEENT: normal  Neck: no JVD, carotid bruits, or masses Cardiac: irreg irreg; no murmurs, rubs, or gallops,no edema  Respiratory:  clear to auscultation bilaterally, normal work of breathing GI: soft, nontender, nondistended, + BS MS: no deformity or atrophy  Skin: warm and dry, no rash Neuro:  Alert and Oriented x 3, Strength and sensation are intact Psych: euthymic mood, full affect  Wt Readings from Last 3 Encounters:  02/26/16 145 lb (65.8 kg)  10/12/15 152 lb (68.9 kg)  09/28/15 150 lb 12.8 oz (68.4 kg)      Studies/Labs Reviewed:   EKG:  EKG is ordered today.  The ekg ordered today demonstrates atrial fibrillation with freq PVCs. HR 89  Recent Labs: 03/20/2015: Magnesium 2.1; TSH 2.498 06/29/2015: ALT 17 11/01/2015: BUN 11; Creat 0.73; Hemoglobin 13.7; Platelets 136; Potassium 4.5; Sodium 140   Lipid Panel     Component Value Date/Time   CHOL 265 (H) 03/20/2015 1351   TRIG 233 (H) 03/20/2015 1351   HDL 47 03/20/2015 1351   CHOLHDL 5.6 (H) 03/20/2015 1351   VLDL 47 (H) 03/20/2015 1351   LDLCALC 171 (H) 03/20/2015 1351    Additional studies/ records that were reviewed today include:  2D ECHO: 04/21/2012 LV EF: 50% -  55%  Study Conclusion - Left ventricle: The cavity size was normal. Wall thickness was normal. Systolic function was normal. The estimated ejection fraction was in the range of 50% to 55%. Wall motion was normal; there were no regional wall motion abnormalities. - Aortic valve: There was very mild stenosis. Mild regurgitation. - Mitral valve: Mild regurgitation. - Atrial septum: No defect or patent foramen ovale was identified. - Tricuspid valve: Moderate regurgitation.   ASSESSMENT & PLAN:   Chronic atrial fibrillation: continue Xarelto 20mg  daily. CHADSVASC score at least 5 (CHF, HTN, age, F sex). Will check a CBC since she had blood in her stool and feels very fatigued. Rate well controlled on Coreg 12.5mg  BID. If blood counts are significantly lower we may need to stop all anticoagulation and have her see GI or PCP for further work up. She attributes many different sx to Xarelto (which I think are unrelated). If all testing comes back normal we may need to try her on a different NOAC or go back to coumadin which she tolerated well for years.  Fatigue: check TSH  DOE: will check a lexiscan myoview, her father had a MI in his 12s and many other family members with CAD.   HTN: BP well controlled on current regimen   HLD: lipids followed by PCP. TC 265, TG 233 and LDL 177. PCP recommended that she start statin therapy. She refuses statin therapy.   Hx of NICM: EF improved back to low normal in 2013. Appears euvolemic.   Total time spent with patient was over 40 minutes which included evaluating patient, reviewing record and coordinating care. Face to face  time >50%.  She was very anxious and had many complaints. I took a lot of time to listen and explain things.    Medication Adjustments/Labs and Tests Ordered: Current medicines are reviewed at length with the patient today.  Concerns regarding medicines are outlined above.  Medication changes, Labs and Tests ordered today are listed in the Patient Instructions below. Patient Instructions  Medication Instructions:  Your physician recommends that you continue on your current medications as directed. Please refer to the Current Medication list given to you today.   Labwork: TODAY:  CBC, TSH, & BMET  Testing/Procedures: Your physician has requested that you have a lexiscan myoview. For further information please visit [email or web address]. Please follow instruction sheet, as given.   Follow-Up: Your physician recommends that you schedule a follow-up appointment in: 3 MONTHS WITH DR. Johnsie Cancel   Any Other Special Instructions Will Be Listed Below (If Applicable).  Pharmacologic Stress Electrocardiogram A pharmacologic stress electrocardiogram is a heart (cardiac) test that uses nuclear imaging to evaluate the blood supply to your heart. This test may also be called a pharmacologic stress electrocardiography. Pharmacologic means that a medicine is used to increase your heart rate and blood pressure.  This stress test is done to find areas of poor blood flow to the heart by determining the extent of coronary artery disease (CAD). Some people exercise on a treadmill, which naturally increases the blood flow to the heart. For those people unable to exercise on a treadmill, a medicine is used. This medicine stimulates your heart and will cause your heart to beat harder and more quickly, as if you were exercising.  Pharmacologic stress tests can help determine:  The adequacy of blood flow to your heart during increased levels of activity in order to clear you for discharge home.  The extent of  coronary artery blockage caused by CAD.  Your  prognosis if you have suffered a heart attack.  The effectiveness of cardiac procedures done, such as an angioplasty, which can increase the circulation in your coronary arteries.  Causes of chest pain or pressure. LET [facility] CARE PROVIDER KNOW ABOUT:  Any allergies you have.  All medicines you are taking, including vitamins, herbs, eye drops, creams, and over-the-counter medicines.  Previous problems you or members of your family have had with the use of anesthetics.  Any blood disorders you have.  Previous surgeries you have had.  Medical conditions you have.  Possibility of pregnancy, if this applies.  If you are currently breastfeeding. RISKS AND COMPLICATIONS Generally, this is a safe procedure. However, as with any procedure, complications can occur. Possible complications include:  You develop pain or pressure in the following areas:  Chest.  Jaw or neck.  Between your shoulder blades.  Radiating down your left arm.  Headache.  Dizziness or light-headedness.  Shortness of breath.  Increased or irregular heartbeat.  Low blood pressure.  Nausea or vomiting.  Flushing.  Redness going up the arm and slight pain during injection of medicine.  Heart attack (rare). BEFORE THE PROCEDURE   Avoid all forms of caffeine for 24 hours before your test or as directed by your health care provider. This includes coffee, tea (even decaffeinated tea), caffeinated sodas, chocolate, cocoa, and certain pain medicines.  Follow your health care provider's instructions regarding eating and drinking before the test.  Take your medicines as directed at regular times with water unless instructed otherwise. Exceptions may include:  If you have diabetes, ask how you are to take your insulin or pills. It is common to adjust insulin dosing the morning of the test.  If you are taking beta-blocker medicines, it is important to  talk to your health care provider about these medicines well before the date of your test. Taking beta-blocker medicines may interfere with the test. In some cases, these medicines need to be changed or stopped 24 hours or more before the test.  If you wear a nitroglycerin patch, it may need to be removed prior to the test. Ask your health care provider if the patch should be removed before the test.  If you use an inhaler for any breathing condition, bring it with you to the test.  If you are an outpatient, bring a snack so you can eat right after the stress phase of the test.  Do not smoke for 4 hours prior to the test or as directed by your health care provider.  Do not apply lotions, powders, creams, or oils on your chest prior to the test.  Wear comfortable shoes and clothing. Let your health care provider know if you were unable to complete or follow the preparations for your test. PROCEDURE   Multiple patches (electrodes) will be put on your chest. If needed, small areas of your chest may be shaved to get better contact with the electrodes. Once the electrodes are attached to your body, multiple wires will be attached to the electrodes, and your heart rate will be monitored.  An IV access will be started. A nuclear trace (isotope) is given. The isotope may be given intravenously, or it may be swallowed. Nuclear refers to several types of radioactive isotopes, and the nuclear isotope lights up the arteries so that the nuclear images are clear. The isotope is absorbed by your body. This results in low radiation exposure.  A resting nuclear image is taken to show how your  heart functions at rest.  A medicine is given through the IV access.  A second scan is done about 1 hour after the medicine injection and determines how your heart functions under stress.  During this stress phase, you will be connected to an electrocardiogram machine. Your blood pressure and oxygen levels will be  monitored. AFTER THE PROCEDURE   Your heart rate and blood pressure will be monitored after the test.  You may return to your normal schedule, including diet,activities, and medicines, unless your health care provider tells you otherwise.   This information is not intended to replace advice given to you by your health care provider. Make sure you discuss any questions you have with your health care provider.   Document Released: 09/28/2008 Document Revised: 05/17/2013 Document Reviewed: 01/17/2013 Elsevier Interactive Patient Education [employer].    If you need a refill on your cardiac medications before your next appointment, please call your pharmacy.      Signed, Angelena Form, PA-C  02/26/2016 9:56 AM    [facility] [address], [place], [place]  [address] Phone: [phone]; Fax: [phone]

## 2016-02-26 ENCOUNTER — Ambulatory Visit (INDEPENDENT_AMBULATORY_CARE_PROVIDER_SITE_OTHER): Payer: Medicare Other | Admitting: Physician Assistant

## 2016-02-26 ENCOUNTER — Encounter: Payer: Self-pay | Admitting: Physician Assistant

## 2016-02-26 VITALS — BP 138/82 | HR 89 | Ht 64.0 in | Wt 145.0 lb

## 2016-02-26 DIAGNOSIS — R0609 Other forms of dyspnea: Secondary | ICD-10-CM | POA: Diagnosis not present

## 2016-02-26 DIAGNOSIS — I10 Essential (primary) hypertension: Secondary | ICD-10-CM

## 2016-02-26 DIAGNOSIS — R5383 Other fatigue: Secondary | ICD-10-CM

## 2016-02-26 DIAGNOSIS — E785 Hyperlipidemia, unspecified: Secondary | ICD-10-CM

## 2016-02-26 DIAGNOSIS — I482 Chronic atrial fibrillation, unspecified: Secondary | ICD-10-CM

## 2016-02-26 LAB — CBC
HCT: 39.9 % (ref 35.0–45.0)
Hemoglobin: 13.4 g/dL (ref 11.7–15.5)
MCH: 30.7 pg (ref 27.0–33.0)
MCHC: 33.6 g/dL (ref 32.0–36.0)
MCV: 91.3 fL (ref 80.0–100.0)
MPV: 9 fL (ref 7.5–12.5)
PLATELETS: 133 10*3/uL — AB (ref 140–400)
RBC: 4.37 MIL/uL (ref 3.80–5.10)
RDW: 12.4 % (ref 11.0–15.0)
WBC: 7.6 10*3/uL (ref 3.8–10.8)

## 2016-02-26 LAB — BASIC METABOLIC PANEL
BUN: 11 mg/dL (ref 7–25)
CHLORIDE: 104 mmol/L (ref 98–110)
CO2: 26 mmol/L (ref 20–31)
Calcium: 9.1 mg/dL (ref 8.6–10.4)
Creat: 0.75 mg/dL (ref 0.60–0.88)
Glucose, Bld: 101 mg/dL — ABNORMAL HIGH (ref 65–99)
POTASSIUM: 4.1 mmol/L (ref 3.5–5.3)
Sodium: 139 mmol/L (ref 135–146)

## 2016-02-26 LAB — TSH: TSH: 1.83 m[IU]/L

## 2016-02-26 NOTE — Patient Instructions (Addendum)
Medication Instructions:  Your physician recommends that you continue on your current medications as directed. Please refer to the Current Medication list given to you today.   Labwork: TODAY:  CBC, TSH, & BMET  Testing/Procedures: Your physician has requested that you have a lexiscan myoview. For further information please visit [email or web address]. Please follow instruction sheet, as given.   Follow-Up: Your physician recommends that you schedule a follow-up appointment in: 3 MONTHS WITH DR. Johnsie Cancel   Any Other Special Instructions Will Be Listed Below (If Applicable).  Pharmacologic Stress Electrocardiogram A pharmacologic stress electrocardiogram is a heart (cardiac) test that uses nuclear imaging to evaluate the blood supply to your heart. This test may also be called a pharmacologic stress electrocardiography. Pharmacologic means that a medicine is used to increase your heart rate and blood pressure.  This stress test is done to find areas of poor blood flow to the heart by determining the extent of coronary artery disease (CAD). Some people exercise on a treadmill, which naturally increases the blood flow to the heart. For those people unable to exercise on a treadmill, a medicine is used. This medicine stimulates your heart and will cause your heart to beat harder and more quickly, as if you were exercising.  Pharmacologic stress tests can help determine:  The adequacy of blood flow to your heart during increased levels of activity in order to clear you for discharge home.  The extent of coronary artery blockage caused by CAD.  Your prognosis if you have suffered a heart attack.  The effectiveness of cardiac procedures done, such as an angioplasty, which can increase the circulation in your coronary arteries.  Causes of chest pain or pressure. LET [facility] CARE PROVIDER KNOW ABOUT:  Any allergies you have.  All medicines you are taking, including vitamins, herbs, eye  drops, creams, and over-the-counter medicines.  Previous problems you or members of your family have had with the use of anesthetics.  Any blood disorders you have.  Previous surgeries you have had.  Medical conditions you have.  Possibility of pregnancy, if this applies.  If you are currently breastfeeding. RISKS AND COMPLICATIONS Generally, this is a safe procedure. However, as with any procedure, complications can occur. Possible complications include:  You develop pain or pressure in the following areas:  Chest.  Jaw or neck.  Between your shoulder blades.  Radiating down your left arm.  Headache.  Dizziness or light-headedness.  Shortness of breath.  Increased or irregular heartbeat.  Low blood pressure.  Nausea or vomiting.  Flushing.  Redness going up the arm and slight pain during injection of medicine.  Heart attack (rare). BEFORE THE PROCEDURE   Avoid all forms of caffeine for 24 hours before your test or as directed by your health care provider. This includes coffee, tea (even decaffeinated tea), caffeinated sodas, chocolate, cocoa, and certain pain medicines.  Follow your health care provider's instructions regarding eating and drinking before the test.  Take your medicines as directed at regular times with water unless instructed otherwise. Exceptions may include:  If you have diabetes, ask how you are to take your insulin or pills. It is common to adjust insulin dosing the morning of the test.  If you are taking beta-blocker medicines, it is important to talk to your health care provider about these medicines well before the date of your test. Taking beta-blocker medicines may interfere with the test. In some cases, these medicines need to be changed or stopped 24 hours or more before  the test.  If you wear a nitroglycerin patch, it may need to be removed prior to the test. Ask your health care provider if the patch should be removed before the  test.  If you use an inhaler for any breathing condition, bring it with you to the test.  If you are an outpatient, bring a snack so you can eat right after the stress phase of the test.  Do not smoke for 4 hours prior to the test or as directed by your health care provider.  Do not apply lotions, powders, creams, or oils on your chest prior to the test.  Wear comfortable shoes and clothing. Let your health care provider know if you were unable to complete or follow the preparations for your test. PROCEDURE   Multiple patches (electrodes) will be put on your chest. If needed, small areas of your chest may be shaved to get better contact with the electrodes. Once the electrodes are attached to your body, multiple wires will be attached to the electrodes, and your heart rate will be monitored.  An IV access will be started. A nuclear trace (isotope) is given. The isotope may be given intravenously, or it may be swallowed. Nuclear refers to several types of radioactive isotopes, and the nuclear isotope lights up the arteries so that the nuclear images are clear. The isotope is absorbed by your body. This results in low radiation exposure.  A resting nuclear image is taken to show how your heart functions at rest.  A medicine is given through the IV access.  A second scan is done about 1 hour after the medicine injection and determines how your heart functions under stress.  During this stress phase, you will be connected to an electrocardiogram machine. Your blood pressure and oxygen levels will be monitored. AFTER THE PROCEDURE   Your heart rate and blood pressure will be monitored after the test.  You may return to your normal schedule, including diet,activities, and medicines, unless your health care provider tells you otherwise.   This information is not intended to replace advice given to you by your health care provider. Make sure you discuss any questions you have with your health  care provider.   Document Released: 09/28/2008 Document Revised: 05/17/2013 Document Reviewed: 01/17/2013 Elsevier Interactive Patient Education [employer].    If you need a refill on your cardiac medications before your next appointment, please call your pharmacy.

## 2016-02-27 ENCOUNTER — Telehealth: Payer: Self-pay | Admitting: Physician Assistant

## 2016-02-27 NOTE — Telephone Encounter (Signed)
Pt aware of her lab results and she verbalized understanding.

## 2016-02-27 NOTE — Telephone Encounter (Signed)
F/u message ° °Pt returning RN call about lab results. Please call back to discuss  °

## 2016-03-04 ENCOUNTER — Ambulatory Visit (INDEPENDENT_AMBULATORY_CARE_PROVIDER_SITE_OTHER): Payer: Medicare Other | Admitting: Internal Medicine

## 2016-03-04 ENCOUNTER — Encounter: Payer: Self-pay | Admitting: Internal Medicine

## 2016-03-04 DIAGNOSIS — N393 Stress incontinence (female) (male): Secondary | ICD-10-CM | POA: Diagnosis not present

## 2016-03-04 DIAGNOSIS — Z23 Encounter for immunization: Secondary | ICD-10-CM | POA: Diagnosis not present

## 2016-03-04 MED ORDER — MIRABEGRON ER 25 MG PO TB24: 25.0000 mg | ORAL_TABLET | Freq: Every day | ORAL | 6 refills | Status: DC

## 2016-03-04 NOTE — Progress Notes (Signed)
Pre visit review using our clinic review tool, if applicable. No additional management support is needed unless otherwise documented below in the visit note. 

## 2016-03-04 NOTE — Patient Instructions (Addendum)
We have sent in myrbetriq for the bladder. Take 1 pill daily and if you do not notice any benefit in 1 month it is okay to stop.   It is okay to use the gel on the cheek to help with the pain. You can also use tylenol.   If you have more bleeding we should send you to the GI doctor to get checked.   Start taking the centrum vitamin and iron/B12 pills for the energy.   Take the melatonin medicine at night time before bed for the next couple of weeks to help get the sleep pattern back again.

## 2016-03-06 ENCOUNTER — Telehealth (HOSPITAL_COMMUNITY): Payer: Self-pay | Admitting: *Deleted

## 2016-03-06 NOTE — Telephone Encounter (Signed)
Patient given detailed instructions per Myocardial Perfusion Study Information Sheet for the test on 03/10/16. Patient notified to arrive 15 minutes early and that it is imperative to arrive on time for appointment to keep from having the test rescheduled.  If you need to cancel or reschedule your appointment, please call the office within 24 hours of your appointment. Failure to do so may result in a cancellation of your appointment, and a $50 no show fee. Patient verbalized understanding. Rebecah Dangerfield Jacqueline    

## 2016-03-06 NOTE — Assessment & Plan Note (Signed)
Rx for myrbetriq to help with worsening night time symptoms. She is also having some safety concerns with safety in the middle of the night with going to the bathroom multiple times.

## 2016-03-06 NOTE — Progress Notes (Signed)
   Subjective:    Patient ID: Pamela Whitaker, female    DOB: 1932-08-29, 82 y.o.   MRN: [id number]  HPI The patient is an 82 YO female coming in for concern about her bladder symptoms. She has struggled with frequency and night time urination for some time. She is now getting up 4-5 times at night which is a safety concern for her and her daughter. She is able to make it to the bathroom but worries about falling. She does not want anything with strong side effects.   Review of Systems  Constitutional: Negative.   Respiratory: Negative.   Cardiovascular: Negative.   Gastrointestinal: Negative.   Genitourinary: Positive for enuresis, frequency and urgency. Negative for decreased urine volume, difficulty urinating, dysuria, flank pain, vaginal bleeding, vaginal discharge and vaginal pain.  Musculoskeletal: Negative.   Neurological: Negative.       Objective:   Physical Exam  Constitutional: She is oriented to person, place, and time. She appears well-developed and well-nourished.  HENT:  Head: Normocephalic and atraumatic.  Cardiovascular: Normal rate.   Pulmonary/Chest: Effort normal and breath sounds normal.  Abdominal: Soft. Bowel sounds are normal. She exhibits no distension. There is no tenderness. There is no rebound.  Musculoskeletal: She exhibits no edema.  Neurological: She is alert and oriented to person, place, and time.  Some instability in walking.    Vitals:   03/04/16 1458  BP: 136/82  Pulse: 87  Resp: 12  Temp: 97.8 F (36.6 C)  TempSrc: Oral  SpO2: 97%  Weight: 152 lb 1.9 oz (69 kg)  Height: 5' 3.5" (1.613 m)      Assessment & Plan:  Flu shot given at visit.

## 2016-03-10 ENCOUNTER — Ambulatory Visit (HOSPITAL_COMMUNITY): Payer: Medicare Other | Attending: Cardiology

## 2016-03-10 DIAGNOSIS — R0609 Other forms of dyspnea: Secondary | ICD-10-CM

## 2016-03-10 DIAGNOSIS — G4440 Drug-induced headache, not elsewhere classified, not intractable: Secondary | ICD-10-CM

## 2016-03-10 LAB — MYOCARDIAL PERFUSION IMAGING
CHL CUP RESTING HR STRESS: 78 {beats}/min
CSEPPHR: 100 {beats}/min
LV dias vol: 63 mL (ref 46–106)
LVSYSVOL: 24 mL
RATE: 0.38
SDS: 2
SRS: 4
SSS: 6
TID: 1.19

## 2016-03-10 MED ORDER — AMINOPHYLLINE 25 MG/ML IV SOLN
75.0000 mg | Freq: Once | INTRAVENOUS | Status: AC
  Administered 2016-03-10: 75 mg via INTRAVENOUS

## 2016-03-10 MED ORDER — TECHNETIUM TC 99M TETROFOSMIN IV KIT
10.2000 | PACK | Freq: Once | INTRAVENOUS | Status: AC | PRN
  Administered 2016-03-10: 10.2 via INTRAVENOUS
  Filled 2016-03-10: qty 11

## 2016-03-10 MED ORDER — TECHNETIUM TC 99M TETROFOSMIN IV KIT
32.6000 | PACK | Freq: Once | INTRAVENOUS | Status: AC | PRN
  Administered 2016-03-10: 32.6 via INTRAVENOUS
  Filled 2016-03-10: qty 33

## 2016-03-10 MED ORDER — REGADENOSON 0.4 MG/5ML IV SOLN
0.4000 mg | Freq: Once | INTRAVENOUS | Status: AC
  Administered 2016-03-10: 0.4 mg via INTRAVENOUS

## 2016-03-11 ENCOUNTER — Telehealth: Payer: Self-pay | Admitting: Cardiovascular Disease

## 2016-03-11 NOTE — Telephone Encounter (Signed)
Called patient about her stress test results. Per Nell Range PA, test was a low study. Patient verbalized understanding, and she stated she felt much better.

## 2016-03-11 NOTE — Telephone Encounter (Signed)
Mrs. Selzler is returning a call about her results . Please call

## 2016-05-01 ENCOUNTER — Ambulatory Visit (INDEPENDENT_AMBULATORY_CARE_PROVIDER_SITE_OTHER): Payer: Medicare Other | Admitting: *Deleted

## 2016-05-01 DIAGNOSIS — I482 Chronic atrial fibrillation, unspecified: Secondary | ICD-10-CM

## 2016-05-01 LAB — CBC
HEMATOCRIT: 39.9 % (ref 35.0–45.0)
HEMOGLOBIN: 13.2 g/dL (ref 11.7–15.5)
MCH: 30.2 pg (ref 27.0–33.0)
MCHC: 33.1 g/dL (ref 32.0–36.0)
MCV: 91.3 fL (ref 80.0–100.0)
MPV: 9.1 fL (ref 7.5–12.5)
PLATELETS: 139 10*3/uL — AB (ref 140–400)
RBC: 4.37 MIL/uL (ref 3.80–5.10)
RDW: 12.9 % (ref 11.0–15.0)
WBC: 7.1 10*3/uL (ref 3.8–10.8)

## 2016-05-01 LAB — BASIC METABOLIC PANEL
BUN: 13 mg/dL (ref 7–25)
CHLORIDE: 103 mmol/L (ref 98–110)
CO2: 29 mmol/L (ref 20–31)
CREATININE: 0.76 mg/dL (ref 0.60–0.88)
Calcium: 9.4 mg/dL (ref 8.6–10.4)
GLUCOSE: 81 mg/dL (ref 65–99)
Potassium: 4.4 mmol/L (ref 3.5–5.3)
Sodium: 139 mmol/L (ref 135–146)

## 2016-05-01 NOTE — Patient Instructions (Addendum)
Stated Xarelto 20mg  daily on 09/28/15.   A full discussion of the nature of anticoagulants has been carried out.  A benefit/risk analysis has been presented to the patient, so that they understand the justification for choosing anticoagulation with Xarelto at this time.  The need for compliance is stressed.  Pt is aware to take the medication once daily with the largest meal of the day.  Side effects of potential bleeding are discussed, including unusual colored urine or stools, coughing up blood or coffee ground emesis, nose bleeds or serious fall or head trauma.  Discussed signs and symptoms of stroke. The patient should avoid any OTC items containing aspirin or ibuprofen.  Avoid alcohol consumption.   Call if any signs of abnormal bleeding.  Discussed financial obligations and resolved any difficulty in obtaining medication.  Next lab test test in 6 months.

## 2016-05-02 NOTE — Progress Notes (Signed)
Pt was started on Xarelto 20mg  for Afib on 09/28/2015.    Reviewed patients medication list.  Pt is not currently on any combined P-gp and strong CYP3A4 inhibitors/inducers (ketoconazole, traconazole, ritonavir, carbamazepine, phenytoin, rifampin, St. John's wort).  Reviewed labs.  SCr 0.76, Weight 153.8lbs, CrCl- 63.94.  Dose appropriate based on CrCl.   Hgb and HCT 13.2/39.9.  A full discussion of the nature of anticoagulants has been carried out.  A benefit/risk analysis has been presented to the patient, so that they understand the justification for choosing anticoagulation with Xarelto at this time.  The need for compliance is stressed.  Pt is aware to take the medication once daily with the largest meal of the day.  Side effects of potential bleeding are discussed, including unusual colored urine or stools, coughing up blood or coffee ground emesis, nose bleeds or serious fall or head trauma.  Discussed signs and symptoms of stroke. The patient should avoid any OTC items containing aspirin or ibuprofen.  Avoid alcohol consumption.   Call if any signs of abnormal bleeding.  Discussed financial obligations and resolved any difficulty in obtaining medication.  Next lab test test in 6 months on 11/04/16 @10am .

## 2016-05-07 ENCOUNTER — Other Ambulatory Visit: Payer: Self-pay | Admitting: Cardiovascular Disease

## 2016-05-10 ENCOUNTER — Other Ambulatory Visit: Payer: Self-pay | Admitting: Cardiovascular Disease

## 2016-05-12 NOTE — Telephone Encounter (Signed)
Medication Detail    Disp Refills Start End   rivaroxaban (XARELTO) 20 MG TABS tablet 30 tablet 11 09/28/2015    Sig - Route: Take 1 tablet (20 mg total) by mouth daily with supper. - Oral   E-Prescribing Status: Receipt confirmed by pharmacy (09/28/2015 9:21 AM EDT)   Pharmacy   [id number] - [place], [place] - [address]

## 2016-05-21 ENCOUNTER — Ambulatory Visit: Payer: Medicare Other | Attending: Physician Assistant | Admitting: Physical Therapy

## 2016-05-21 DIAGNOSIS — R262 Difficulty in walking, not elsewhere classified: Secondary | ICD-10-CM

## 2016-05-21 DIAGNOSIS — G8929 Other chronic pain: Secondary | ICD-10-CM | POA: Insufficient documentation

## 2016-05-21 DIAGNOSIS — R293 Abnormal posture: Secondary | ICD-10-CM | POA: Diagnosis present

## 2016-05-21 DIAGNOSIS — M6281 Muscle weakness (generalized): Secondary | ICD-10-CM | POA: Diagnosis present

## 2016-05-21 DIAGNOSIS — M25561 Pain in right knee: Secondary | ICD-10-CM | POA: Insufficient documentation

## 2016-05-21 DIAGNOSIS — R296 Repeated falls: Secondary | ICD-10-CM | POA: Insufficient documentation

## 2016-05-21 NOTE — Therapy (Signed)
[place], [place], [address] Phone: [phone]   Fax:  [phone]  Physical Therapy Evaluation  Patient Details  Name: Pamela Whitaker MRN: [id number] Date of Birth: 10/12/1932 Referring Provider: Dr. Gerrit Halls   Encounter Date: 05/21/2016      PT End of Session - 05/21/16 1518    Visit Number 1   Number of Visits 16   Date for PT Re-Evaluation 07/16/16   Authorization Type UHC MCR complete    Authorization Time Period No visit limit    PT Start Time 1417   PT Stop Time 1513   PT Time Calculation (min) 56 min   Activity Tolerance Patient tolerated treatment well   Behavior During Therapy [facility] for tasks assessed/performed      Past Medical History:  Diagnosis Date  . Atrial fibrillation ([place])    treated with multaq x 6 mos in 2011....[name]=2 (Age; + CHF; no CVA; no DM2). Echo 05/24/10: EF 30% mild AS(mean 72mmgHg); mild LAE; mod LAE; PASP 35 Systolic CHF  . Breast cancer ([place])     in 1987 with a left  mastectomy  . Cardiomyopathy    a. tachy mediated Myoview 06/06/10:  EF 31%; no scar or ischemia; EF up to 50% per echo November 2013  . Diverticulosis   . DJD (degenerative joint disease) of knee    right knee  . Falls   . Hypercholesteremia   . Hypertension   . Lumbar stenosis   . Osteoporosis    history of bone chips in the right knee,previous right fibular fracture, operated on by Dr. Veverly Fells  . RLS (restless legs syndrome)   . Vitamin D deficiency     Past Surgical History:  Procedure Laterality Date  . BREAST RECONSTRUCTION  1988   left breast  . CARDIOVERSION  2012  . KNEE ARTHROSCOPY  2005  . MASTECTOMY, RADICAL  1987   left breast  . WRIST FRACTURE SURGERY     left wrist    There were no vitals filed for this visit.       Subjective Assessment - 05/21/16 1421    Subjective Patient presents with new referral for bilateral knee OA.  She states she wanted to walk  better.  Her pain is mostly in her foot.  She walks with a Rollator walker.  She reports knees giving way at times, feels unsteady, Rt. knee pops.  She had an injection yesterday she thinks it was a "gel for cushioning".    Patient is accompained by: --   Pertinent History A-fib, osteoporosis and chronic knee pain, fibular fracture, wrist fracture.    Limitations Walking;Standing;Lifting;House hold activities   How long can you stand comfortably? lacks confidence    How long can you walk comfortably? 15-20 min    Diagnostic tests XR recently OA per patient.     Patient Stated Goals Walk better.    Currently in Pain? Yes   Pain Score 5    Pain Location Knee   Pain Orientation Right   Pain Descriptors / Indicators Discomfort;Sore   Pain Type Chronic pain   Pain Onset More than a month ago   Pain Frequency Intermittent   Aggravating Factors  bending knee   Pain Relieving Factors ice, rest             [facility] PT Assessment - 05/21/16 1429      Assessment   Medical Diagnosis bilateral knee OA   Referring Provider Dr.  Gerrit Halls    Onset Date/Surgical Date --  chronic    Hand Dominance Right   Prior Therapy yes      Precautions   Precautions None     Restrictions   Weight Bearing Restrictions No     Balance Screen   Has the patient fallen in the past 6 months Yes   How many times? fell about 6 weeks ago, 5 times in 6 mos.    Has the patient had a decrease in activity level because of a fear of falling?  Yes   Is the patient reluctant to leave their home because of a fear of falling?  No     Home Environment   Living Environment Private residence   [place] to enter   Entrance Stairs-Number of Steps 2   Entrance Stairs-Rails Right   [place] One level     Prior Function   Level of Independence Independent with basic ADLs;Requires assistive device for independence;Needs assistance with homemaking   Leisure church, water  aerobics      Cognition   Overall Cognitive Status Within Functional Limits for tasks assessed  casually reports memory loss     Observation/Other Assessments   Focus on Therapeutic Outcomes (FOTO)  refused to do      Observation/Other Assessments-Edema    Edema Circumferential     Circumferential Edema   Circumferential - Right 13.75 inch  pocket of swelling prox lateral knee and also medial , mild    Circumferential - Left  13.75 inch      Sensation   Light Touch Appears Intact     Posture/Postural Control   Posture/Postural Control Postural limitations   Postural Limitations Rounded Shoulders;Forward head   Posture Comments severe genu valgus     AROM   Right Knee Extension 9   Right Knee Flexion 123   Left Knee Extension 3   Left Knee Flexion 133     Strength   Right Hip Flexion 3+/5   Right Hip Extension 4-/5   Right Hip ABduction 3-/5   Left Hip Flexion 4-/5   Left Hip Extension 4/5   Right Knee Flexion 4+/5   Right Knee Extension 4+/5   Left Knee Flexion 4+/5   Left Knee Extension 4+/5     Palpation   Patella mobility limited    Palpation comment non tender, patietn recieivd her last injecition today     Bed Mobility   Bed Mobility Supine to Sit   Supine to Sit 4: Min guard     Transfers   Comments increased effort for sit to stand      Ambulation/Gait   Ambulation Distance (Feet) 150 Feet   Assistive device 4-wheeled walker   Gait Pattern Step-to pattern;Right foot flat;Left foot flat;Right genu recurvatum;Left genu recurvatum;Abducted - left;Abducted- right                   [facility] Adult PT Treatment/Exercise - 05/21/16 1429      Self-Care   Self-Care Posture;Heat/Ice Application;Other Self-Care Comments   Posture genu valgus    Heat/Ice Application ice pack post injection   Other Self-Care Comments  HEP reinforcement, rationale      Knee/Hip Exercises: Sidelying   Clams x 10      Cryotherapy   Number Minutes Cryotherapy 10  Minutes   Cryotherapy Location Knee   Type of Cryotherapy Ice pack  PT Education - 05/21/16 1503    Education provided Yes   Education Details PT/POC, HEP reissued, balance    Person(s) Educated Patient   Methods Explanation   Comprehension Verbalized understanding;Returned demonstration          PT Short Term Goals - 05/21/16 1525      PT SHORT TERM GOAL #1   Title "Independent with initial HEP   Time 4   Period Weeks   Status New     PT SHORT TERM GOAL #2   Title Pt will complete Berg Balance Screen and understand results, modify home if needed.    Time 4   Period Weeks   Status New     PT SHORT TERM GOAL #3   Title Pt will complete sit to stand with greater ease at normal chair heights.     Time 4   Period Weeks   Status New     PT SHORT TERM GOAL #4   Title Pt will complete Assessment of Balance Confidence and set goal.    Time 4   Period Weeks   Status New           PT Long Term Goals - 05/21/16 1527      PT LONG TERM GOAL #1   Title Pt will improve balance confidence (ABC) TBA    Time 8   Period Weeks   Status New     PT LONG TERM GOAL #2   Title Patient will report no pain in right knee with ambulation in church, up to 30 min.    Status New     PT LONG TERM GOAL #3   Title Berg Score will improve by 6 or more points to demo improved fall risk.    Time 8   Period Weeks   Status New     PT LONG TERM GOAL #4   Title Pt will improve hip abduction to 4/5 or more to aid in gait stability.    Baseline 3-/5 to 3/5   Time 8   Period Weeks   Status New     PT LONG TERM GOAL #5   Title Pt will be able to control knee hyperxtension with gait with min to no cueing.    Time 8   Period Weeks   Status New               Plan - 05/21/16 1519    Clinical Impression Statement Patient presents for low complexity eval of chronic knee pain and gait issues.  She returns after several months and a round of gel injections for  Rt. knee.  She has good strength overall in knees but is significantly weak in hip abductors., gluteals.  Quads are functionally weak as knees hyperextend with gait.  She was advised to follow up with her eye doctor as it appears she has some bleeding in her Rt. eye which had been going on since Friday.  She will benefit from strenthening program and balance work, if balance does not improve she may benefit from Neurorehab for balance work.     Rehab Potential Good   Clinical Impairments Affecting Rehab Potential Valgus deformity of the R knee, frequent falls   PT Frequency 2x / week   PT Duration 8 weeks   PT Treatment/Interventions ADLs/Self Care Home Management;Cryotherapy;Lobbyist;Therapeutic exercise;Therapeutic activities;Functional mobility training;Ultrasound;Neuromuscular re-education;Patient/family education;Manual techniques;Taping;Energy conservation   PT Next Visit Plan Berg Balance, review and encourage HEP, gait . standing hip abd  PT Home Exercise Plan level 1-2 knee and added clam in sidelying    Consulted and Agree with Plan of Care Patient      Patient will benefit from skilled therapeutic intervention in order to improve the following deficits and impairments:  Abnormal gait, Decreased activity tolerance, Decreased balance, Decreased mobility, Difficulty walking, Impaired flexibility, Decreased endurance, Pain  Visit Diagnosis: Muscle weakness (generalized)  Difficulty in walking, not elsewhere classified  Repeated falls  Chronic pain of right knee  Abnormal posture      G-Codes - 06-Jun-2016 1531    Functional Assessment Tool Used clinical judgement    Functional Limitation Mobility: Walking and moving around   Mobility: Walking and Moving Around Current Status [phone]) At least 60 percent but less than 80 percent impaired, limited or restricted   Mobility: Walking and Moving Around Goal Status [phone]) At least 40 percent but less than  60 percent impaired, limited or restricted       Problem List Patient Active Problem List   Diagnosis Date Noted  . Urinary incontinence 10/14/2015  . Encounter for Medicare annual wellness exam 12/06/2014  . HX: breast cancer 06/29/2014  . Hypertension   . Vitamin D deficiency   . Spinal stenosis, lumbar region, with neurogenic claudication 03/28/2013    Class: Chronic  . At high risk for falls 02/23/2013  . Systolic heart failure ([place]) 06/07/2010  . Congestive dilated cardiomyopathy ([place]) 05/29/2010  . Atrial fibrillation ([place]) 04/16/2009  . Elevated lipids 04/13/2009  . Osteoarthritis 04/13/2009    PAA,JENNIFER 06-06-2016, 3:35 PM  [facility] [address] [place], [place], [address] Phone: [phone]   Fax:  [phone]  Name: Pamela Whitaker MRN: [id number] Date of Birth: 1932-11-24  Raeford Razor, PT 2016-06-06 3:36 PM Phone: [phone] Fax: [phone]

## 2016-05-21 NOTE — Patient Instructions (Signed)
Abduction: Clam (Eccentric) - Side-Lying    Lie on side with knees bent. Lift top knee, keeping feet together. Keep trunk steady. Slowly lower for 3-5 seconds. __10-20_ reps per set, __1_ sets per day, __5_ days per week.   Hip adduction squeeze  Quad set with towel  SAQ  LAQ  SLR  Seated heel raises  From previous HEP in 09/2015

## 2016-05-22 ENCOUNTER — Ambulatory Visit: Payer: Medicare Other | Admitting: Physical Therapy

## 2016-05-22 DIAGNOSIS — R293 Abnormal posture: Secondary | ICD-10-CM

## 2016-05-22 DIAGNOSIS — M6281 Muscle weakness (generalized): Secondary | ICD-10-CM | POA: Diagnosis not present

## 2016-05-22 DIAGNOSIS — G8929 Other chronic pain: Secondary | ICD-10-CM

## 2016-05-22 DIAGNOSIS — R262 Difficulty in walking, not elsewhere classified: Secondary | ICD-10-CM

## 2016-05-22 DIAGNOSIS — M25561 Pain in right knee: Secondary | ICD-10-CM

## 2016-05-22 DIAGNOSIS — R296 Repeated falls: Secondary | ICD-10-CM

## 2016-05-22 NOTE — Therapy (Signed)
[place], [place], [address] Phone: [phone]   Fax:  [phone]  Physical Therapy Treatment  Patient Details  Name: Pamela Whitaker MRN: [id number] Date of Birth: 07/04/1932 Referring Provider: Dr. Gerrit Halls   Encounter Date: 05/22/2016      PT End of Session - 05/22/16 1301    Visit Number 2   Number of Visits 16   Date for PT Re-Evaluation 07/16/16   PT Start Time 1017   PT Stop Time 1115   PT Time Calculation (min) 58 min   Activity Tolerance Patient tolerated treatment well   Behavior During Therapy [facility] for tasks assessed/performed      Past Medical History:  Diagnosis Date  . Atrial fibrillation ([place])    treated with multaq x 6 mos in 2011....[name]=2 (Age; + CHF; no CVA; no DM2). Echo 05/24/10: EF 30% mild AS(mean 60mgHg); mild LAE; mod LAE; PASP 35 Systolic CHF  . Breast cancer ([place]     in 1987 with a left  mastectomy  . Cardiomyopathy    a. tachy mediated Myoview 06/06/10:  EF 31%; no scar or ischemia; EF up to 50% per echo November 2013  . Diverticulosis   . DJD (degenerative joint disease) of knee    right knee  . Falls   . Hypercholesteremia   . Hypertension   . Lumbar stenosis   . Osteoporosis    history of bone chips in the right knee,previous right fibular fracture, operated on by Dr. NVeverly Fells . RLS (restless legs syndrome)   . Vitamin D deficiency     Past Surgical History:  Procedure Laterality Date  . BREAST RECONSTRUCTION  1988   left breast  . CARDIOVERSION  2012  . KNEE ARTHROSCOPY  2005  . MASTECTOMY, RADICAL  1987   left breast  . WRIST FRACTURE SURGERY     left wrist    There were no vitals filed for this visit.      Subjective Assessment - 05/22/16 1023    Subjective Plans to see the MD for her eye after her appointment today.  Knees were sore yesterday.(9/10)   Currently in Pain? Yes   Pain Score 6    Pain Location Knee   Pain Orientation  Right            [facility] - 05[facility]16 1429      Assessment   Medical Diagnosis bilateral knee OA   Referring Provider Dr. CGerrit Halls   Onset Date/Surgical Date --  chronic    Hand Dominance Right   Prior Therapy yes      Precautions   Precautions None     Restrictions   Weight Bearing Restrictions No     Balance Screen   Has the patient fallen in the past 6 months Yes   How many times? fell about 6 weeks ago, 5 times in 6 mos.    Has the patient had a decrease in activity level because of a fear of falling?  Yes   Is the patient reluctant to leave their home because of a fear of falling?  No     Home Environment   Living Environment Private residence   LDumbartonto enter   Entrance Stairs-Number of Steps 2   Entrance Stairs-Rails Right   [place] level     Prior Function   Level of Independence Independent with basic ADLs;Requires assistive  device for independence;Needs assistance with homemaking   Leisure church, water aerobics      Cognition   Overall Cognitive Status Within Functional Limits for tasks assessed  casually reports memory loss     Observation/Other Assessments   Focus on Therapeutic Outcomes (FOTO)  refused to do      Observation/Other Assessments-Edema    Edema Circumferential     Circumferential Edema   Circumferential - Right 13.75 inch  pocket of swelling prox lateral knee and also medial , mild    Circumferential - Left  13.75 inch      Sensation   Light Touch Appears Intact     Posture/Postural Control   Posture/Postural Control Postural limitations   Postural Limitations Rounded Shoulders;Forward head   Posture Comments severe genu valgus     AROM   Right Knee Extension 9   Right Knee Flexion 123   Left Knee Extension 3   Left Knee Flexion 133     Strength   Right Hip Flexion 3+/5   Right Hip Extension 4-/5   Right Hip ABduction 3-/5   Left Hip Flexion 4-/5    Left Hip Extension 4/5   Right Knee Flexion 4+/5   Right Knee Extension 4+/5   Left Knee Flexion 4+/5   Left Knee Extension 4+/5     Palpation   Patella mobility limited    Palpation comment non tender, patietn recieivd her last injecition today     Bed Mobility   Bed Mobility Supine to Sit   Supine to Sit 4: Min guard     Transfers   Comments increased effort for sit to stand      Ambulation/Gait   Ambulation Distance (Feet) 150 Feet   Assistive device 4-wheeled walker   Gait Pattern Step-to pattern;Right foot flat;Left foot flat;Right genu recurvatum;Left genu recurvatum;Abducted - left;Abducted- right                     [facility] Adult PT Treatment/Exercise - 05/22/16 0001      Berg Balance Test   Sit to Stand Needs minimal aid to stand or to stabilize   Standing Unsupported Able to stand 2 minutes with supervision   Sitting with Back Unsupported but Feet Supported on Floor or Stool Able to sit safely and securely 2 minutes   Stand to Sit Sits independently, has uncontrolled descent   Transfers Able to transfer safely, definite need of hands   Standing Unsupported with Eyes Closed Unable to keep eyes closed 3 seconds but stays steady   Standing Ubsupported with Feet Together Needs help to attain position and unable to hold for 15 seconds   From Standing, Reach Forward with Outstretched Arm Reaches forward but needs supervision   From Standing Position, Pick up Object from Floor Unable to pick up and needs supervision   From Standing Position, Turn to Look Behind Over each Shoulder Needs assist to keep from losing balance and falling   Turn 360 Degrees Needs assistance while turning   Standing Unsupported, Alternately Place Feet on Step/Stool Needs assistance to keep from falling or unable to try   Standing Unsupported, One Foot in [employer] balance while stepping or standing   Standing on One Leg Unable to try or needs assist to prevent fall   Total Score 15      Cryotherapy   Number Minutes Cryotherapy 15 Minutes   Cryotherapy Location Knee   Type of Cryotherapy --  cold pack  PT Education - 05/22/16 1301    Education provided Yes   Education Details Must use walker 100 % of time.   Person(s) Educated Patient   Methods Explanation   Comprehension Verbalized understanding          PT Short Term Goals - 05/22/16 1304      PT SHORT TERM GOAL #2   Title Pt will complete Berg Balance Screen and understand results, modify home if needed.    Baseline 15/56.  , understands her results.     Time 4   Period Weeks   Status Partially Met           PT Long Term Goals - 31-May-2016 1527      PT LONG TERM GOAL #1   Title Pt will improve balance confidence (ABC) TBA    Time 8   Period Weeks   Status New     PT LONG TERM GOAL #2   Title Patient will report no pain in right knee with ambulation in church, up to 30 min.    Status New     PT LONG TERM GOAL #3   Title Berg Score will improve by 6 or more points to demo improved fall risk.    Time 8   Period Weeks   Status New     PT LONG TERM GOAL #4   Title Pt will improve hip abduction to 4/5 or more to aid in gait stability.    Baseline 3-/5 to 3/5   Time 8   Period Weeks   Status New     PT LONG TERM GOAL #5   Title Pt will be able to control knee hyperxtension with gait with min to no cueing.    Time 8   Period Weeks   Status New               Plan - 05/22/16 1302    Clinical Impression Statement Pamela Whitaker 15/56.  Per PT Pamela Whitaker, patient must use a walker 100% of the time.  Patient agreed to using walker. Extra time required for BERG .   PT Next Visit Plan Check to see if she is using walker 100 %.  Review exercises, standing hip abduction.   PT Home Exercise Plan level 1-2 knee and added clam in sidelying    Consulted and Agree with Plan of Care Patient      Patient will benefit from skilled therapeutic intervention in order to improve the  following deficits and impairments:  Abnormal gait, Decreased activity tolerance, Decreased balance, Decreased mobility, Difficulty walking, Impaired flexibility, Decreased endurance, Pain  Visit Diagnosis: Muscle weakness (generalized)  Difficulty in walking, not elsewhere classified  Repeated falls  Chronic pain of right knee  Abnormal posture       G-Codes - 2016-05-31 1531    Functional Assessment Tool Used clinical judgement    Functional Limitation Mobility: Walking and moving around   Mobility: Walking and Moving Around Current Status [phone]) At least 60 percent but less than 80 percent impaired, limited or restricted   Mobility: Walking and Moving Around Goal Status [phone]) At least 40 percent but less than 60 percent impaired, limited or restricted      Problem List Patient Active Problem List   Diagnosis Date Noted  . Urinary incontinence 10/14/2015  . Encounter for Medicare annual wellness exam 12/06/2014  . HX: breast cancer 06/29/2014  . Hypertension   . Vitamin D deficiency   . Spinal stenosis, lumbar region,  with neurogenic claudication 03/28/2013    Class: Chronic  . At high risk for falls 02/23/2013  . Systolic heart failure ([place]) 06/07/2010  . Congestive dilated cardiomyopathy ([place]) 05/29/2010  . Atrial fibrillation ([place]) 04/16/2009  . Elevated lipids 04/13/2009  . Osteoarthritis 04/13/2009    Pamela Whitaker PTA 05/22/2016, 1:06 PM  [facility] [address] [place], [place], [address] Phone: [phone]   Fax:  [phone]  Name: Pamela Whitaker MRN: [id number] Date of Birth: 1933-05-15

## 2016-05-22 NOTE — Patient Instructions (Signed)
Must use walker 100% of time for safety.

## 2016-05-28 ENCOUNTER — Ambulatory Visit: Payer: Medicare Other | Attending: Physician Assistant | Admitting: Physical Therapy

## 2016-05-28 DIAGNOSIS — R293 Abnormal posture: Secondary | ICD-10-CM | POA: Diagnosis present

## 2016-05-28 DIAGNOSIS — G8929 Other chronic pain: Secondary | ICD-10-CM | POA: Insufficient documentation

## 2016-05-28 DIAGNOSIS — R296 Repeated falls: Secondary | ICD-10-CM | POA: Diagnosis present

## 2016-05-28 DIAGNOSIS — M25561 Pain in right knee: Secondary | ICD-10-CM | POA: Diagnosis present

## 2016-05-28 DIAGNOSIS — M6281 Muscle weakness (generalized): Secondary | ICD-10-CM | POA: Insufficient documentation

## 2016-05-28 DIAGNOSIS — R262 Difficulty in walking, not elsewhere classified: Secondary | ICD-10-CM | POA: Insufficient documentation

## 2016-05-29 ENCOUNTER — Encounter: Payer: Self-pay | Admitting: Physical Therapy
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
## 2016-05-30 ENCOUNTER — Encounter: Payer: Medicare Other | Admitting: Physical Therapy

## 2016-05-30 NOTE — Progress Notes (Deleted)
Cardiology Office Note    Date:  05/30/2016   ID:  Pamela Whitaker, DOB 10/21/1932, MRN [id number]  PCP:  Pamela Koch, MD  Cardiologist:  Dr. Johnsie Whitaker  CC: 6 month follow up  History of Present Illness:  Pamela Whitaker is a 83 y.o. female with a history of chronic atrial fibrillation on Xarelto, HLD,  dementia, posterior tibial tendinitis, asthma, history of NICM (EF 30%--> 50-55%) and mod TR who presents to clinic for 6 month follow up.   Previously had a NICM with EF down to 35-40% by echo in 2012. Myoview done 05/2010 normal  EF improved to 50-55% with mild AS, mild MR, mod TR in 03/2012.   She has chronic afib with rate control strategy. She has failed Multaq and amiodarone in the past.   May 2017  changed to [place] due to it being difficult to get to INR appointments. She was changed to Xarelto 20mg  daily. She had only fallen once and used her cane as directed. She is unsteady due to a deformity of her foot.   She called the office on 01/23/16 to report blood in her stool and dizziness/fatigue after taking Xarelto and Coreg. He advised her to keep on these medicines and follow up with GI/PCP if it continued.   Today she presents to clinic for follow up. She has been having a lot of issues with Xarelto and says she gets "all the side effects that are listed including jaw pain with opening her mouth, fatigue, balance issues, hacky cough, worsening insomnia and bleeding from hemorrhoids." Since starting the Xarelto she has had at least 5 cup fulls of blood in her stool that she thinks are from hemorrhoids. She does water aerobics and can't gp into the water because of the bleeding. She thinks its worse with coffee and chocolate. She did have a fall 3-4 weeks ago on the sidewalk while sweeping and she lost her balance. No CP. She has some worsening DOE. No LE edema, orthopnea or PND. No dizziness or syncope. Overall, she just "wants to feel better and live to watch her great grandchildren  grow."  She has a strong family hx of CAD. Her father had very premature CAD with a heart attack 70s.   Myovue 03/10/16 Normal EF 62%    Past Medical History:  Diagnosis Date  . Atrial fibrillation ([place])    treated with multaq x 6 mos in 2011....[name]=2 (Age; + CHF; no CVA; no DM2). Echo 05/24/10: EF 30% mild AS(mean 110mmgHg); mild LAE; mod LAE; PASP 35 Systolic CHF  . Breast cancer ([place])     in 1987 with a left  mastectomy  . Cardiomyopathy    a. tachy mediated Myoview 06/06/10:  EF 31%; no scar or ischemia; EF up to 50% per echo November 2013  . Diverticulosis   . DJD (degenerative joint disease) of knee    right knee  . Falls   . Hypercholesteremia   . Hypertension   . Lumbar stenosis   . Osteoporosis    history of bone chips in the right knee,previous right fibular fracture, operated on by Dr. Veverly Fells  . RLS (restless legs syndrome)   . Vitamin D deficiency     Past Surgical History:  Procedure Laterality Date  . BREAST RECONSTRUCTION  1988   left breast  . CARDIOVERSION  2012  . KNEE ARTHROSCOPY  2005  . MASTECTOMY, RADICAL  1987   left breast  . WRIST FRACTURE SURGERY  left wrist    Current Medications: Outpatient Medications Prior to Visit  Medication Sig Dispense Refill  . acetaminophen (TYLENOL) 500 MG tablet Take 500 mg by mouth every 6 (six) hours as needed (pt takes 1-2 tablets at night for pain).    . carvedilol (COREG) 6.25 MG tablet TAKE (12.5 MG) 2 TABLETS BY MOUTH IN AM AND TAKE (6.25 MG) 1 TABLET BY MOUTH IN PM 90 tablet 10  . Cholecalciferol (VITAMIN D) 2000 UNITS tablet Take 2,000 Units by mouth daily. Does not take regularly    . diclofenac sodium (VOLTAREN) 1 % GEL Apply 4 g topically 4 (four) times daily. 100 g 4  . mirabegron ER (MYRBETRIQ) 25 MG TB24 tablet Take 1 tablet (25 mg total) by mouth daily. 30 tablet 6  . Multiple Vitamin (MULTIVITAMIN) capsule Take 1 capsule by mouth daily. Women's Centrum    . rivaroxaban (XARELTO) 20 MG TABS  tablet Take 1 tablet (20 mg total) by mouth daily with supper. 30 tablet 11   No facility-administered medications prior to visit.      Allergies:   Amiodarone; Lipitor [atorvastatin]; and Multaq [dronedarone]   Social History   Social History  . Marital status: Married    Spouse name: Pamela Whitaker  . Number of children: 2  . Years of education: college   Occupational History  . retired Retired   Social History Main Topics  . Smoking status: Never Smoker  . Smokeless tobacco: Never Used  . Alcohol use No  . Drug use: No  . Sexual activity: No   Other Topics Concern  . Not on file   Social History Narrative  . No narrative on file     Family History:  The patient's family history includes Aortic aneurysm in her mother; Breast cancer in her sister; Diabetes in her sister; Heart attack in her father; Stomach cancer in her sister.     ROS:   Please see the history of present illness.    ROS All other systems reviewed and are negative.   PHYSICAL EXAM:   VS:  There were no vitals taken for this visit.   GEN: Well nourished, well developed, in no acute distress  HEENT: normal  Neck: no JVD, carotid bruits, or masses Cardiac: irreg irreg; no murmurs, rubs, or gallops,no edema  Respiratory:  clear to auscultation bilaterally, normal work of breathing GI: soft, nontender, nondistended, + BS MS: no deformity or atrophy  Skin: warm and dry, no rash Neuro:  Alert and Oriented x 3, Strength and sensation are intact Psych: euthymic mood, full affect  Wt Readings from Last 3 Encounters:  03/04/16 152 lb 1.9 oz (69 kg)  02/26/16 145 lb (65.8 kg)  10/12/15 152 lb (68.9 kg)      Studies/Labs Reviewed:   EKG:  EKG is ordered today.  The ekg ordered today demonstrates atrial fibrillation with freq PVCs. HR 89  Recent Labs: 06/29/2015: ALT 17 02/26/2016: TSH 1.83 05/01/2016: BUN 13; Creat 0.76; Hemoglobin 13.2; Platelets 139; Potassium 4.4; Sodium 139   Lipid Panel      Component Value Date/Time   CHOL 265 (H) 03/20/2015 1351   TRIG 233 (H) 03/20/2015 1351   HDL 47 03/20/2015 1351   CHOLHDL 5.6 (H) 03/20/2015 1351   VLDL 47 (H) 03/20/2015 1351   LDLCALC 171 (H) 03/20/2015 1351    Additional studies/ records that were reviewed today include:  2D ECHO: 04/21/2012 LV EF: 50% -  55% Study Conclusion - Left ventricle: The cavity  size was normal. Wall thickness was normal. Systolic function was normal. The estimated ejection fraction was in the range of 50% to 55%. Wall motion was normal; there were no regional wall motion abnormalities. - Aortic valve: There was very mild stenosis. Mild regurgitation. - Mitral valve: Mild regurgitation. - Atrial septum: No defect or patent foramen ovale was identified. - Tricuspid valve: Moderate regurgitation.   ASSESSMENT & PLAN:   Chronic atrial fibrillation: continue Xarelto 20mg  daily. CHADSVASC score at least 5 (CHF, HTN, age, F sex).  Rate well controlled on Coreg 12.5mg  BID.  She attributes many different sx to Xarelto (which I think are unrelated).   Fatigue: labs and TSH ok f/u pirmary   DOE: functional EF normal by myovue   HTN: BP well controlled on current regimen   HLD: lipids followed by PCP. TC 265, TG 233 and LDL 177. PCP recommended that she start statin therapy. She refuses statin therapy.   Hx of NICM: EF improved back to low normal in 2013. Appears euvolemic.    Jenkins Rouge

## 2016-06-02 NOTE — Progress Notes (Signed)
Cardiology Office Note    Date:  06/05/2016   ID:  [name], DOB 25-Mar-1933, MRN [id number]  PCP:  Hoyt Koch, MD  Cardiologist:  Dr. Johnsie Cancel  CC: 6 month follow up  History of Present Illness:  Pamela Whitaker is a 83 y.o. female with a history of chronic atrial fibrillation on Xarelto, HLD,  dementia, posterior tibial tendinitis, asthma, history of NICM (EF 30%--> 50-55%) and mod TR who presents to clinic for 6 month follow up.   Previously had a NICM with EF down to 35-40% by echo in 2012. Myoview done 05/2010 normal  EF improved to 50-55% with mild AS, mild MR, mod TR in 03/2012.   She has chronic afib with rate control strategy. She has failed Multaq and amiodarone in the past.   May 2017  changed to [place] due to it being difficult to get to INR appointments. She was changed to Xarelto 20mg  daily. She had only fallen once and used her cane as directed. She is unsteady due to a deformity of her foot.   She called the office on 01/23/16 to report blood in her stool and dizziness/fatigue after taking Xarelto and Coreg. He advised her to keep on these medicines and follow up with GI/PCP if it continued.   Today she presents to clinic for follow up. She has been having a lot of issues with Xarelto and says she gets "all the side effects that are listed including jaw pain with opening her mouth, fatigue, balance issues, hacky cough, worsening insomnia and bleeding from hemorrhoids." Since starting the Xarelto she has had at least 5 cup fulls of blood in her stool that she thinks are from hemorrhoids. She does water aerobics and can't gp into the water because of the bleeding. She thinks its worse with coffee and chocolate. She did have a fall 3-4 weeks ago on the sidewalk while sweeping and she lost her balance. No CP. She has some worsening DOE. No LE edema, orthopnea or PND. No dizziness or syncope. Overall, she just "wants to feel better and live to watch her great  grandchildren grow."  She has a strong family hx of CAD. Her father had very premature CAD with a heart attack 34s.   Myovue 03/10/16 Normal EF 62%    Past Medical History:  Diagnosis Date  . Atrial fibrillation ([place])    treated with multaq x 6 mos in 2011....[name]=2 (Age; + CHF; no CVA; no DM2). Echo 05/24/10: EF 30% mild AS(mean 85mmgHg); mild LAE; mod LAE; PASP 35 Systolic CHF  . Breast cancer ([place])     in 1987 with a left  mastectomy  . Cardiomyopathy    a. tachy mediated Myoview 06/06/10:  EF 31%; no scar or ischemia; EF up to 50% per echo November 2013  . Diverticulosis   . DJD (degenerative joint disease) of knee    right knee  . Falls   . Hypercholesteremia   . Hypertension   . Lumbar stenosis   . Osteoporosis    history of bone chips in the right knee,previous right fibular fracture, operated on by Dr. Veverly Fells  . RLS (restless legs syndrome)   . Vitamin D deficiency     Past Surgical History:  Procedure Laterality Date  . BREAST RECONSTRUCTION  1988   left breast  . CARDIOVERSION  2012  . KNEE ARTHROSCOPY  2005  . MASTECTOMY, RADICAL  1987   left breast  . WRIST FRACTURE SURGERY  left wrist    Current Medications: Outpatient Medications Prior to Visit  Medication Sig Dispense Refill  . acetaminophen (TYLENOL) 500 MG tablet Take 500 mg by mouth every 6 (six) hours as needed (pt takes 1-2 tablets at night for pain).    . carvedilol (COREG) 6.25 MG tablet TAKE (12.5 MG) 2 TABLETS BY MOUTH IN AM AND TAKE (6.25 MG) 1 TABLET BY MOUTH IN PM 90 tablet 10  . Cholecalciferol (VITAMIN D) 2000 UNITS tablet Take 2,000 Units by mouth daily. Does not take regularly    . mirabegron ER (MYRBETRIQ) 25 MG TB24 tablet Take 1 tablet (25 mg total) by mouth daily. 30 tablet 6  . Multiple Vitamin (MULTIVITAMIN) capsule Take 1 capsule by mouth daily. Women's Centrum    . rivaroxaban (XARELTO) 20 MG TABS tablet Take 1 tablet (20 mg total) by mouth daily with supper. 30 tablet 11  .  diclofenac sodium (VOLTAREN) 1 % GEL Apply 4 g topically 4 (four) times daily. 100 g 4   No facility-administered medications prior to visit.      Allergies:   Amiodarone; Lipitor [atorvastatin]; and Multaq [dronedarone]   Social History   Social History  . Marital status: Married    Spouse name: Chrissie Noa  . Number of children: 2  . Years of education: college   Occupational History  . retired Retired   Social History Main Topics  . Smoking status: Never Smoker  . Smokeless tobacco: Never Used  . Alcohol use No  . Drug use: No  . Sexual activity: No   Other Topics Concern  . None   Social History Narrative  . None     Family History:  The patient's family history includes Aortic aneurysm in her mother; Breast cancer in her sister; Diabetes in her sister; Heart attack in her father; Stomach cancer in her sister.     ROS:   Please see the history of present illness.    ROS All other systems reviewed and are negative.   PHYSICAL EXAM:   VS:  BP 108/66   Pulse 79   Ht 5\' 4"  (1.626 m)   Wt 151 lb 9.6 oz (68.8 kg)   SpO2 98%   BMI 26.02 kg/m    GEN: Well nourished, well developed, in no acute distress  HEENT: normal  Neck: no JVD, carotid bruits, or masses Cardiac: irreg irreg; no murmurs, rubs, or gallops,no edema  Respiratory:  clear to auscultation bilaterally, normal work of breathing GI: soft, nontender, nondistended, + BS MS: no deformity or atrophy  Skin: warm and dry, no rash Neuro:  Alert and Oriented x 3, Strength and sensation are intact Psych: euthymic mood, full affect  Wt Readings from Last 3 Encounters:  06/05/16 151 lb 9.6 oz (68.8 kg)  03/04/16 152 lb 1.9 oz (69 kg)  02/26/16 145 lb (65.8 kg)      Studies/Labs Reviewed:   EKG:  EKG is ordered today.  The ekg ordered today demonstrates atrial fibrillation with freq PVCs. HR 89  Recent Labs: 06/29/2015: ALT 17 02/26/2016: TSH 1.83 05/01/2016: BUN 13; Creat 0.76; Hemoglobin 13.2; Platelets  139; Potassium 4.4; Sodium 139   Lipid Panel    Component Value Date/Time   CHOL 265 (H) 03/20/2015 1351   TRIG 233 (H) 03/20/2015 1351   HDL 47 03/20/2015 1351   CHOLHDL 5.6 (H) 03/20/2015 1351   VLDL 47 (H) 03/20/2015 1351   LDLCALC 171 (H) 03/20/2015 1351    Additional studies/ records that were  reviewed today include:  2D ECHO: 04/21/2012 LV EF: 50% -  55% Study Conclusion - Left ventricle: The cavity size was normal. Wall thickness was normal. Systolic function was normal. The estimated ejection fraction was in the range of 50% to 55%. Wall motion was normal; there were no regional wall motion abnormalities. - Aortic valve: There was very mild stenosis. Mild regurgitation. - Mitral valve: Mild regurgitation. - Atrial septum: No defect or patent foramen ovale was identified. - Tricuspid valve: Moderate regurgitation.   ASSESSMENT & PLAN:   Chronic atrial fibrillation: continue Xarelto 20mg  daily. CHADSVASC score at least 5 (CHF, HTN, age, F sex).  Rate well controlled on Coreg 12.5mg  BID.  She attributes many different sx to Xarelto (which I think are unrelated).   Fatigue: labs and TSH ok f/u pirmary   DOE: functional EF normal by myovue   HTN: BP well controlled on current regimen   HLD: lipids followed by PCP. TC 265, TG 233 and LDL 177. PCP recommended that she start statin therapy. She refuses statin therapy.   Hx of NICM: EF improved back to low normal in 2013. Appears euvolemic.    Jenkins Rouge

## 2016-06-03 ENCOUNTER — Ambulatory Visit: Payer: Medicare Other | Admitting: Physical Therapy

## 2016-06-03 ENCOUNTER — Ambulatory Visit: Payer: Medicare Other | Admitting: Cardiovascular Disease

## 2016-06-05 ENCOUNTER — Ambulatory Visit (INDEPENDENT_AMBULATORY_CARE_PROVIDER_SITE_OTHER): Payer: Medicare Other | Admitting: Cardiovascular Disease

## 2016-06-05 ENCOUNTER — Encounter: Payer: Self-pay | Admitting: Physical Therapy

## 2016-06-05 ENCOUNTER — Ambulatory Visit: Payer: Medicare Other | Admitting: Physical Therapy

## 2016-06-05 ENCOUNTER — Encounter: Payer: Self-pay | Admitting: Cardiovascular Disease

## 2016-06-05 VITALS — BP 108/66 | HR 79 | Ht 64.0 in | Wt 151.6 lb

## 2016-06-05 DIAGNOSIS — M6281 Muscle weakness (generalized): Secondary | ICD-10-CM | POA: Diagnosis not present

## 2016-06-05 DIAGNOSIS — I4891 Unspecified atrial fibrillation: Secondary | ICD-10-CM

## 2016-06-05 DIAGNOSIS — M25561 Pain in right knee: Secondary | ICD-10-CM

## 2016-06-05 DIAGNOSIS — R293 Abnormal posture: Secondary | ICD-10-CM

## 2016-06-05 DIAGNOSIS — G8929 Other chronic pain: Secondary | ICD-10-CM

## 2016-06-05 DIAGNOSIS — R262 Difficulty in walking, not elsewhere classified: Secondary | ICD-10-CM

## 2016-06-05 DIAGNOSIS — R296 Repeated falls: Secondary | ICD-10-CM

## 2016-06-05 NOTE — Therapy (Signed)
[place], [place], [address] Phone: [phone]   Fax:  [phone]  Physical Therapy Treatment  Patient Details  Name: Pamela Whitaker MRN: [id number] Date of Birth: 1932-07-29 Referring Provider: Dr. Gerrit Halls   Encounter Date: 06/05/2016      PT End of Session - 06/05/16 1349    Visit Number 4   Number of Visits 16   Date for PT Re-Evaluation 07/16/16   Authorization Type UHC MCR complete    Authorization Time Period No visit limit    PT Start Time 1330   PT Stop Time 1413   PT Time Calculation (min) 43 min   Activity Tolerance Patient tolerated treatment well   Behavior During Therapy [facility] for tasks assessed/performed      Past Medical History:  Diagnosis Date  . Atrial fibrillation ([place])    treated with multaq x 6 mos in 2011....[name]=2 (Age; + CHF; no CVA; no DM2). Echo 05/24/10: EF 30% mild AS(mean 69mmgHg); mild LAE; mod LAE; PASP 35 Systolic CHF  . Breast cancer ([place])     in 1987 with a left  mastectomy  . Cardiomyopathy    a. tachy mediated Myoview 06/06/10:  EF 31%; no scar or ischemia; EF up to 50% per echo November 2013  . Diverticulosis   . DJD (degenerative joint disease) of knee    right knee  . Falls   . Hypercholesteremia   . Hypertension   . Lumbar stenosis   . Osteoporosis    history of bone chips in the right knee,previous right fibular fracture, operated on by Dr. Veverly Fells  . RLS (restless legs syndrome)   . Vitamin D deficiency     Past Surgical History:  Procedure Laterality Date  . BREAST RECONSTRUCTION  1988   left breast  . CARDIOVERSION  2012  . KNEE ARTHROSCOPY  2005  . MASTECTOMY, RADICAL  1987   left breast  . WRIST FRACTURE SURGERY     left wrist    There were no vitals filed for this visit.      Subjective Assessment - 06/05/16 1338    Subjective Patient had a fall over the weekend. She hit her left knee. She report it hurt but it is getting  better. She has a scab on her right knee but no apparnt bruisng.    Limitations Walking;Standing;Lifting;House hold activities   How long can you stand comfortably? lacks confidence    How long can you walk comfortably? 15-20 min    Diagnostic tests XR recently OA per patient.     Patient Stated Goals Walk better.    Currently in Pain? Yes   Pain Score 6    Pain Location Knee   Pain Descriptors / Indicators Discomfort   Pain Onset More than a month ago   Pain Frequency Intermittent   Aggravating Factors  bending the knee    Pain Relieving Factors ice/rest    Effect of Pain on Daily Activities falls, diffiulty walking    Multiple Pain Sites No                         [facility] Adult PT Treatment/Exercise - 06/05/16 0001      High Level Balance   High Level Balance Comments feet together eyexs opened and eyes closed 2x30sec each; step forward and back alternating with hands close to counter and contact gaurd; side stepping unsupported;      Knee/Hip  Exercises: Standing   Heel Raises Limitations 2x10     Knee/Hip Exercises: Supine   Quad Sets Limitations 2x10    Short Arc Quad Sets Limitations 2x10      Knee/Hip Exercises: Sidelying   Clams 2x10      Cryotherapy   Number Minutes Cryotherapy 10 Minutes   Cryotherapy Location Knee   Type of Cryotherapy Ice pack                PT Education - 06/05/16 1349    Education provided Yes   Education Details improtance of stregthening    Person(s) Educated Patient   Methods Explanation   Comprehension Verbalized understanding          PT Short Term Goals - 06/05/16 1351      PT SHORT TERM GOAL #1   Title "Independent with initial HEP   Baseline working on her exercises and going to the pool   Time 4   Period Weeks   Status On-going     PT SHORT TERM GOAL #2   Title Pt will complete Berg Balance Screen and understand results, modify home if needed.    Baseline 15/56.  , understands her results.      Time 4   Period Weeks   Status On-going     PT SHORT TERM GOAL #3   Title Pt will complete sit to stand with greater ease at normal chair heights.     Time 4   Period Weeks   Status On-going     PT SHORT TERM GOAL #4   Title Pt will complete Assessment of Balance Confidence and set goal.    Baseline continues to work on stregth    Time 4   Period Weeks   Status On-going     PT SHORT TERM GOAL #5   Title Pt will perform BERG and set long term goal for reduction of fall risk   Baseline performed on 09/25/15    Time 4   Period Weeks   Status On-going           PT Long Term Goals - 05/29/16 [id number]      PT LONG TERM GOAL #1   Title Pt will improve balance confidence (ABC) TBA    Time 8   Period Weeks   Status On-going     PT LONG TERM GOAL #2   Title Patient will report no pain in right knee with ambulation in church, up to 30 min.    Time 8   Period Weeks   Status On-going     PT LONG TERM GOAL #3   Title Berg Score will improve by 6 or more points to demo improved fall risk.    Time 8   Period Weeks   Status On-going     PT LONG TERM GOAL #4   Title Pt will improve hip abduction to 4/5 or more to aid in gait stability.    Baseline 3-/5 to 3/5   Time 8   Period Weeks   Status On-going     PT LONG TERM GOAL #5   Title Pt will be able to control knee hyperxtension with gait with min to no cueing.    Time 8   Period Weeks   Status On-going     PT LONG TERM GOAL #6   Title Pt will improve BERG to at least 40/56 from 20/56 to reduce risk of fall using assistive device   Time 8  Period Weeks   Status On-going               Plan - 06/05/16 1712    Clinical Impression Statement Therapy continues to encourage the patient to use her walker at all times. She did well with balance activity today but still requires min a and use of hands at times. She was fatigued with exercises but able to complete all exercises.    Rehab Potential Good   Clinical  Impairments Affecting Rehab Potential Valgus deformity of the R knee, frequent falls   PT Frequency 2x / week   PT Duration 8 weeks   PT Treatment/Interventions ADLs/Self Care Home Management;Cryotherapy;Lobbyist;Therapeutic exercise;Therapeutic activities;Functional mobility training;Ultrasound;Neuromuscular re-education;Patient/family education;Manual techniques;Taping;Energy conservation   PT Next Visit Plan Check to see if she is using walker 100 %.  Review exercises, standing hip abduction.   PT Home Exercise Plan level 1-2 knee and added clam in sidelying    Consulted and Agree with Plan of Care Patient      Patient will benefit from skilled therapeutic intervention in order to improve the following deficits and impairments:  Abnormal gait, Decreased activity tolerance, Decreased balance, Decreased mobility, Difficulty walking, Impaired flexibility, Decreased endurance, Pain  Visit Diagnosis: Muscle weakness (generalized)  Difficulty in walking, not elsewhere classified  Repeated falls  Chronic pain of right knee  Abnormal posture     Problem List Patient Active Problem List   Diagnosis Date Noted  . Urinary incontinence 10/14/2015  . Encounter for Medicare annual wellness exam 12/06/2014  . HX: breast cancer 06/29/2014  . Hypertension   . Vitamin D deficiency   . Spinal stenosis, lumbar region, with neurogenic claudication 03/28/2013    Class: Chronic  . At high risk for falls 02/23/2013  . Systolic heart failure ([place]) 06/07/2010  . Congestive dilated cardiomyopathy ([place]) 05/29/2010  . Atrial fibrillation ([place]) 04/16/2009  . Elevated lipids 04/13/2009  . Osteoarthritis 04/13/2009    Carney Living PT DPT  06/05/2016, 5:19 PM  [facility] [address] [place], [place], [address] Phone: [phone]   Fax:  [phone]  Name: Pamela Whitaker MRN: [id number] Date of Birth:  Dec 29, 1932

## 2016-06-05 NOTE — Patient Instructions (Signed)

## 2016-06-10 ENCOUNTER — Ambulatory Visit: Payer: Medicare Other | Admitting: Physical Therapy

## 2016-06-10 DIAGNOSIS — R293 Abnormal posture: Secondary | ICD-10-CM

## 2016-06-10 DIAGNOSIS — R262 Difficulty in walking, not elsewhere classified: Secondary | ICD-10-CM

## 2016-06-10 DIAGNOSIS — G8929 Other chronic pain: Secondary | ICD-10-CM

## 2016-06-10 DIAGNOSIS — R296 Repeated falls: Secondary | ICD-10-CM

## 2016-06-10 DIAGNOSIS — M25561 Pain in right knee: Secondary | ICD-10-CM

## 2016-06-10 DIAGNOSIS — M6281 Muscle weakness (generalized): Secondary | ICD-10-CM | POA: Diagnosis not present

## 2016-06-10 NOTE — Therapy (Signed)
[place], [place], [address] Phone: [phone]   Fax:  [phone]  Physical Therapy Treatment  Patient Details  Name: Pamela Whitaker MRN: [id number] Date of Birth: 04-16-1933 Referring Provider: Dr. Gerrit Halls   Encounter Date: 06/10/2016      PT End of Session - 06/10/16 1158    Visit Number 5   Number of Visits 16   Date for PT Re-Evaluation 07/16/16   PT Start Time [id number]   PT Stop Time 1205   PT Time Calculation (min) 63 min   Activity Tolerance Patient tolerated treatment well   Behavior During Therapy [facility] for tasks assessed/performed      Past Medical History:  Diagnosis Date  . Atrial fibrillation ([place])    treated with multaq x 6 mos in 2011....[name]=2 (Age; + CHF; no CVA; no DM2). Echo 05/24/10: EF 30% mild AS(mean 27mmgHg); mild LAE; mod LAE; PASP 35 Systolic CHF  . Breast cancer ([place])     in 1987 with a left  mastectomy  . Cardiomyopathy    a. tachy mediated Myoview 06/06/10:  EF 31%; no scar or ischemia; EF up to 50% per echo November 2013  . Diverticulosis   . DJD (degenerative joint disease) of knee    right knee  . Falls   . Hypercholesteremia   . Hypertension   . Lumbar stenosis   . Osteoporosis    history of bone chips in the right knee,previous right fibular fracture, operated on by Dr. Veverly Fells  . RLS (restless legs syndrome)   . Vitamin D deficiency     Past Surgical History:  Procedure Laterality Date  . BREAST RECONSTRUCTION  1988   left breast  . CARDIOVERSION  2012  . KNEE ARTHROSCOPY  2005  . MASTECTOMY, RADICAL  1987   left breast  . WRIST FRACTURE SURGERY     left wrist    There were no vitals filed for this visit.      Subjective Assessment - 06/10/16 1129    Subjective L knee is healing, knee pain 6/10 at times.  Reports using her walker 50% of the time. She would like to get another walker for home use, too difficult to bring walker in her house.  has not done HEP.   She uses her cane. She had alot of back pain Sat pm which was unusual for her.  Better now.    Currently in Pain? Yes   Pain Score 6    Pain Location Knee   Pain Orientation Right;Left   Pain Descriptors / Indicators Discomfort   Pain Type Chronic pain   Pain Onset More than a month ago   Pain Frequency Intermittent   Aggravating Factors  bending it, putting pressure on it.    Pain Relieving Factors rest                         [facility] Adult PT Treatment/Exercise - 06/10/16 1117      Self-Care   Other Self-Care Comments  HEP, gait, need for walker in the home      Knee/Hip Exercises: Standing   Heel Raises Both;1 set;20 reps   Hip Flexion Stengthening;Both;1 set;10 reps   Hip Abduction Stengthening;Both;1 set;15 reps   Gait Training high knees x 2, retrogait    Other Standing Knee Exercises sidestepping x 6 for lateral hip stability , increased time for rest breaks and to focus  Knee/Hip Exercises: Seated   Long Arc Quad Strengthening;Both;1 set;15 reps   Knee/Hip Flexion ankle PF seated  x10      Cryotherapy   Number Minutes Cryotherapy 10 Minutes   Cryotherapy Location Knee   Type of Cryotherapy Ice pack     Ankle Exercises: Standing   Heel Raises 10 reps   Other Standing Ankle Exercises toe taps in standing single leg x 15                 PT Education - 06/10/16 1157    Education provided Yes   Education Details HEP, gait cues , walker safety   Person(s) Educated Patient   Methods Explanation   Comprehension Verbalized understanding;Need further instruction          PT Short Term Goals - 06/05/16 1351      PT SHORT TERM GOAL #1   Title "Independent with initial HEP   Baseline working on her exercises and going to the pool   Time 4   Period Weeks   Status On-going     PT SHORT TERM GOAL #2   Title Pt will complete Berg Balance Screen and understand results, modify home if needed.    Baseline 15/56.  ,  understands her results.     Time 4   Period Weeks   Status On-going     PT SHORT TERM GOAL #3   Title Pt will complete sit to stand with greater ease at normal chair heights.     Time 4   Period Weeks   Status On-going     PT SHORT TERM GOAL #4   Title Pt will complete Assessment of Balance Confidence and set goal.    Baseline continues to work on stregth    Time 4   Period Weeks   Status On-going     PT SHORT TERM GOAL #5   Title Pt will perform BERG and set long term goal for reduction of fall risk   Baseline performed on 09/25/15    Time 4   Period Weeks   Status On-going           PT Long Term Goals - 06/10/16 1202      PT LONG TERM GOAL #1   Title Pt will improve balance confidence per patient report in home environment.    Status Revised     PT LONG TERM GOAL #2   Title Patient will report no pain in right knee with ambulation in church, up to 30 min.    Status On-going     PT LONG TERM GOAL #3   Title Berg Score will improve by 6 or more points to demo improved fall risk.    Status On-going     PT LONG TERM GOAL #4   Title Pt will improve hip abduction to 4/5 or more to aid in gait stability.    Status On-going     PT LONG TERM GOAL #5   Title Pt will be able to control knee hyperxtension with gait with min to no cueing.    Status On-going     PT LONG TERM GOAL #6   Title Pt will improve BERG to at least 40/56 from 20/56 to reduce risk of fall using assistive device (duplicate)    Status On-going               Plan - 06/10/16 1158    Clinical Impression Statement Worked on reinforcing HEP and gait in  parallel bars.  Cues to heel strike improved knee comfort.  Insurance may assist in getting another Rollator for the home.     PT Next Visit Plan Review exercises, standing hip abduction and gait activities    PT Home Exercise Plan level 1-2 knee and added clam in sidelying    Consulted and Agree with Plan of Care Patient      Patient will  benefit from skilled therapeutic intervention in order to improve the following deficits and impairments:  Abnormal gait, Decreased activity tolerance, Decreased balance, Decreased mobility, Difficulty walking, Impaired flexibility, Decreased endurance, Pain  Visit Diagnosis: Muscle weakness (generalized)  Difficulty in walking, not elsewhere classified  Repeated falls  Chronic pain of right knee  Abnormal posture     Problem List Patient Active Problem List   Diagnosis Date Noted  . Urinary incontinence 10/14/2015  . Encounter for Medicare annual wellness exam 12/06/2014  . HX: breast cancer 06/29/2014  . Hypertension   . Vitamin D deficiency   . Spinal stenosis, lumbar region, with neurogenic claudication 03/28/2013    Class: Chronic  . At high risk for falls 02/23/2013  . Systolic heart failure ([place]) 06/07/2010  . Congestive dilated cardiomyopathy ([place]) 05/29/2010  . Atrial fibrillation ([place]) 04/16/2009  . Elevated lipids 04/13/2009  . Osteoarthritis 04/13/2009    Jadian Karman 06/10/2016, 12:12 PM  [facility] [address] [place], [place], [address] Phone: [phone]   Fax:  [phone]  Name: Pamela Whitaker MRN: [id number] Date of Birth: 16-Jul-1932   Raeford Razor, PT 06/10/16 12:13 PM Phone: [phone] Fax: [phone]

## 2016-06-12 ENCOUNTER — Ambulatory Visit: Payer: Medicare Other | Admitting: Physical Therapy

## 2016-06-17 ENCOUNTER — Ambulatory Visit: Payer: Medicare Other | Admitting: Physical Therapy

## 2016-06-17 DIAGNOSIS — R293 Abnormal posture: Secondary | ICD-10-CM

## 2016-06-17 DIAGNOSIS — M6281 Muscle weakness (generalized): Secondary | ICD-10-CM

## 2016-06-17 DIAGNOSIS — G8929 Other chronic pain: Secondary | ICD-10-CM

## 2016-06-17 DIAGNOSIS — R262 Difficulty in walking, not elsewhere classified: Secondary | ICD-10-CM

## 2016-06-17 DIAGNOSIS — R296 Repeated falls: Secondary | ICD-10-CM

## 2016-06-17 DIAGNOSIS — M25561 Pain in right knee: Secondary | ICD-10-CM

## 2016-06-17 NOTE — Therapy (Signed)
[place], [place], [address] Phone: [phone]   Fax:  [phone]  Physical Therapy Treatment  Patient Details  Name: Pamela Whitaker MRN: [id number] Date of Birth: Jan 05, 1933 Referring Provider: Dr. Gerrit Halls   Encounter Date: 06/17/2016      PT End of Session - 06/17/16 1132    Visit Number 6   Number of Visits 16   Date for PT Re-Evaluation 07/16/16   Authorization Type UHC MCR complete    Authorization Time Period No visit limit    PT Start Time 1105   PT Stop Time 1200   PT Time Calculation (min) 55 min   Activity Tolerance Patient tolerated treatment well   Behavior During Therapy [facility] for tasks assessed/performed      Past Medical History:  Diagnosis Date  . Atrial fibrillation ([place])    treated with multaq x 6 mos in 2011....[name]=2 (Age; + CHF; no CVA; no DM2). Echo 05/24/10: EF 30% mild AS(mean 96mmgHg); mild LAE; mod LAE; PASP 35 Systolic CHF  . Breast cancer ([place])     in 1987 with a left  mastectomy  . Cardiomyopathy    a. tachy mediated Myoview 06/06/10:  EF 31%; no scar or ischemia; EF up to 50% per echo November 2013  . Diverticulosis   . DJD (degenerative joint disease) of knee    right knee  . Falls   . Hypercholesteremia   . Hypertension   . Lumbar stenosis   . Osteoporosis    history of bone chips in the right knee,previous right fibular fracture, operated on by Dr. Veverly Fells  . RLS (restless legs syndrome)   . Vitamin D deficiency     Past Surgical History:  Procedure Laterality Date  . BREAST RECONSTRUCTION  1988   left breast  . CARDIOVERSION  2012  . KNEE ARTHROSCOPY  2005  . MASTECTOMY, RADICAL  1987   left breast  . WRIST FRACTURE SURGERY     left wrist    There were no vitals filed for this visit.      Subjective Assessment - 06/17/16 1119    Subjective Happy to get her signed Rx for new walker.  No pain.  Missed coming to PT when the snow came.      Currently in Pain? No/denies            [facility] Adult PT Treatment/Exercise - 06/17/16 1121      Ambulation/Gait   Assistive device Rolling walker   Gait Comments practiced gait with rolling walker, recommendtions made for her to go get this after PT      Lumbar Exercises: Stretches   Single Knee to Chest Stretch 2 reps;30 seconds   Lower Trunk Rotation --   Lower Trunk Rotation Limitations 10 reps      Knee/Hip Exercises: Seated   Long Arc Quad Strengthening;Both;1 set;15 reps   Ball Squeeze x 20    Clamshell with TheraBand Red  x 10    Knee/Hip Flexion seated march x 10      Knee/Hip Exercises: Supine   Bridges Strengthening;Both;2 sets;10 reps   Bridges Limitations with ball    Straight Leg Raises Strengthening;Both;1 set;10 reps   Straight Leg Raises Limitations quad lag    Other Supine Knee/Hip Exercises --     Knee/Hip Exercises: Sidelying   Clams x 20      Moist Heat Therapy   Number Minutes Moist Heat 10 Minutes   Moist Heat Location  Lumbar Spine     Cryotherapy   Number Minutes Cryotherapy 10 Minutes   Cryotherapy Location Knee   Type of Cryotherapy Ice pack                PT Education - 06/17/16 1158    Education provided Yes   Education Details gait with walker, HEP   Person(s) Educated Patient   Methods Explanation;Demonstration;Handout;Verbal cues   Comprehension Verbalized understanding;Returned demonstration          PT Short Term Goals - 06/05/16 1351      PT SHORT TERM GOAL #1   Title "Independent with initial HEP   Baseline working on her exercises and going to the pool   Time 4   Period Weeks   Status On-going     PT SHORT TERM GOAL #2   Title Pt will complete Berg Balance Screen and understand results, modify home if needed.    Baseline 15/56.  , understands her results.     Time 4   Period Weeks   Status On-going     PT SHORT TERM GOAL #3   Title Pt will complete sit to stand with greater ease at normal chair  heights.     Time 4   Period Weeks   Status On-going     PT SHORT TERM GOAL #4   Title Pt will complete Assessment of Balance Confidence and set goal.    Baseline continues to work on stregth    Time 4   Period Weeks   Status On-going     PT SHORT TERM GOAL #5   Title Pt will perform BERG and set long term goal for reduction of fall risk   Baseline performed on 09/25/15    Time 4   Period Weeks   Status On-going           PT Long Term Goals - 06/10/16 1202      PT LONG TERM GOAL #1   Title Pt will improve balance confidence per patient report in home environment.    Status Revised     PT LONG TERM GOAL #2   Title Patient will report no pain in right knee with ambulation in church, up to 30 min.    Status On-going     PT LONG TERM GOAL #3   Title Berg Score will improve by 6 or more points to demo improved fall risk.    Status On-going     PT LONG TERM GOAL #4   Title Pt will improve hip abduction to 4/5 or more to aid in gait stability.    Status On-going     PT LONG TERM GOAL #5   Title Pt will be able to control knee hyperxtension with gait with min to no cueing.    Status On-going     PT LONG TERM GOAL #6   Title Pt will improve BERG to at least 40/56 from 20/56 to reduce risk of fall using assistive device (duplicate)    Status On-going               Plan - 06/17/16 1205    Clinical Impression Statement Patient educated on gait with a rolling walker, rec she get RW for home use.  She does well with mat level exercises, needs to be be consistent with HEP.  She will have to do HEP on the bed and I told her that was fine.     PT Next Visit Plan Progress  strength and standing balance, gait    PT Home Exercise Plan level 1-2 knee and added clam in sidelying bridge and knee to chest    Consulted and Agree with Plan of Care Patient      Patient will benefit from skilled therapeutic intervention in order to improve the following deficits and impairments:   Abnormal gait, Decreased activity tolerance, Decreased balance, Decreased mobility, Difficulty walking, Impaired flexibility, Decreased endurance, Pain  Visit Diagnosis: Muscle weakness (generalized)  Difficulty in walking, not elsewhere classified  Repeated falls  Chronic pain of right knee  Abnormal posture     Problem List Patient Active Problem List   Diagnosis Date Noted  . Urinary incontinence 10/14/2015  . Encounter for Medicare annual wellness exam 12/06/2014  . HX: breast cancer 06/29/2014  . Hypertension   . Vitamin D deficiency   . Spinal stenosis, lumbar region, with neurogenic claudication 03/28/2013    Class: Chronic  . At high risk for falls 02/23/2013  . Systolic heart failure ([place]) 06/07/2010  . Congestive dilated cardiomyopathy ([place]) 05/29/2010  . Atrial fibrillation ([place]) 04/16/2009  . Elevated lipids 04/13/2009  . Osteoarthritis 04/13/2009    PAA,JENNIFER 06/17/2016, 12:09 PM  [facility] [address] [place], [place], [address] Phone: [phone]   Fax:  [phone]  Name: ROMAYNE ORAN MRN: [id number] Date of Birth: 05-Dec-1932   Raeford Razor, PT 06/17/16 12:10 PM Phone: [phone] Fax: [phone]

## 2016-06-17 NOTE — Patient Instructions (Signed)
PELVIC STABILIZATION: Basic Bridge    Exhaling, lift hips. Hold for ___ breaths. Exhaling, release hips back to floor. Repeat ___ times. Do ___ times per day.  Copyright  VHI. All rights reserved.    Knee to Chest    Lying supine, bend involved knee to chest __3_ times. Hold 20-30 sec Repeat with other leg. Do _2__ times per day.  Copyright  VHI. All rights reserved.

## 2016-06-19 ENCOUNTER — Ambulatory Visit: Payer: Medicare Other | Admitting: Physical Therapy

## 2016-06-19 DIAGNOSIS — M6281 Muscle weakness (generalized): Secondary | ICD-10-CM

## 2016-06-19 DIAGNOSIS — R296 Repeated falls: Secondary | ICD-10-CM

## 2016-06-19 DIAGNOSIS — M25561 Pain in right knee: Secondary | ICD-10-CM

## 2016-06-19 DIAGNOSIS — R293 Abnormal posture: Secondary | ICD-10-CM

## 2016-06-19 DIAGNOSIS — G8929 Other chronic pain: Secondary | ICD-10-CM

## 2016-06-19 DIAGNOSIS — R262 Difficulty in walking, not elsewhere classified: Secondary | ICD-10-CM

## 2016-06-19 NOTE — Therapy (Signed)
[place], [place], [address] Phone: [phone]   Fax:  [phone]  Physical Therapy Treatment  Patient Details  Name: Pamela Whitaker MRN: [id number] Date of Birth: 1933/02/17 Referring Provider: Dr. Gerrit Halls   Encounter Date: 06/19/2016      PT End of Session - 06/19/16 1325    Visit Number 7   Number of Visits 16   Date for PT Re-Evaluation 07/16/16   PT Start Time [id number]   PT Stop Time 1200   PT Time Calculation (min) 55 min   Activity Tolerance Patient tolerated treatment well   Behavior During Therapy [facility] for tasks assessed/performed      Past Medical History:  Diagnosis Date  . Atrial fibrillation ([place])    treated with multaq x 6 mos in 2011....[name]=2 (Age; + CHF; no CVA; no DM2). Echo 05/24/10: EF 30% mild AS(mean 70mmgHg); mild LAE; mod LAE; PASP 35 Systolic CHF  . Breast cancer ([place])     in 1987 with a left  mastectomy  . Cardiomyopathy    a. tachy mediated Myoview 06/06/10:  EF 31%; no scar or ischemia; EF up to 50% per echo November 2013  . Diverticulosis   . DJD (degenerative joint disease) of knee    right knee  . Falls   . Hypercholesteremia   . Hypertension   . Lumbar stenosis   . Osteoporosis    history of bone chips in the right knee,previous right fibular fracture, operated on by Dr. Veverly Fells  . RLS (restless legs syndrome)   . Vitamin D deficiency     Past Surgical History:  Procedure Laterality Date  . BREAST RECONSTRUCTION  1988   left breast  . CARDIOVERSION  2012  . KNEE ARTHROSCOPY  2005  . MASTECTOMY, RADICAL  1987   left breast  . WRIST FRACTURE SURGERY     left wrist    There were no vitals filed for this visit.      Subjective Assessment - 06/19/16 1108    Subjective Im a little sore in my hips.  I did my exercises on my bed. Walks in with new walker.     Currently in Pain? No/denies               [facility] Adult PT Treatment/Exercise -  06/19/16 0001      Lumbar Exercises: Stretches   Lower Trunk Rotation 5 reps   Lower Trunk Rotation Limitations x 10      Knee/Hip Exercises: Aerobic   Nustep L7 UE and LE 9 min      Knee/Hip Exercises: Standing   Heel Raises 1 set;20 reps   Hip Abduction Stengthening;Both;1 set;10 reps     Knee/Hip Exercises: Supine   Short Arc Quad Sets Strengthening;Both;1 set;15 reps   Short Arc Quad Sets Limitations 5 lbs    Heel Slides Strengthening;Both;15 reps   Heel Slides Limitations legs on ball    Bridges Strengthening;Both;2 sets;10 reps   Bridges Limitations with legs on ball    Straight Leg Raises Strengthening;Both;1 set;10 reps   Straight Leg Raises Limitations on ball      Moist Heat Therapy   Number Minutes Moist Heat 7 Minutes   Moist Heat Location Lumbar Spine     Cryotherapy   Number Minutes Cryotherapy 7 Minutes   Cryotherapy Location Knee   Type of Cryotherapy Ice pack     Ankle Exercises: Stretches   Slant Board Stretch 3 reps;30 seconds  Slant Board Stretch Limitations one leg at a time                   PT Short Term Goals - 06/19/16 1130      PT SHORT TERM GOAL #1   Title "Independent with initial HEP   Status Achieved     PT SHORT TERM GOAL #2   Title Pt will complete Berg Balance Screen and understand results, modify home if needed.    Baseline received RW    Status Achieved     PT SHORT TERM GOAL #3   Title Pt will complete sit to stand with greater ease at normal chair heights.     Status On-going     PT SHORT TERM GOAL #4   Title Pt will complete Assessment of Balance Confidence and set goal.    Baseline completed but unable to find results    Status Deferred     PT SHORT TERM GOAL #5   Title Pt will perform BERG and set long term goal for reduction of fall risk   Status Achieved           PT Long Term Goals - 06/19/16 1131      PT LONG TERM GOAL #1   Title Pt will improve balance confidence per patient report in home  environment.    Status On-going     PT LONG TERM GOAL #2   Title Patient will report no pain in right knee with ambulation in church, up to 30 min.    Status On-going     PT LONG TERM GOAL #3   Title Berg Score will improve by 6 or more points to demo improved fall risk.    Status Unable to assess     PT LONG TERM GOAL #4   Title Pt will improve hip abduction to 4/5 or more to aid in gait stability.    Status Unable to assess     PT LONG TERM GOAL #5   Title Pt will be able to control knee hyperxtension with gait with min to no cueing.    Status On-going     PT LONG TERM GOAL #6   Title Pt will improve BERG to at least 40/56 from 20/56 to reduce risk of fall using assistive device (duplicate)    Status Unable to assess               Plan - 06/19/16 1145    Clinical Impression Statement Pt more steady and safe with new RW.  She continues to have quad weakness and knee hyperextension with gait. PRogressing with gait, balance and hip strength.    PT Next Visit Plan try step ups and progress strength and balance, gait    PT Home Exercise Plan level 1-2 knee and added clam in sidelying bridge and knee to chest    Consulted and Agree with Plan of Care Patient      Patient will benefit from skilled therapeutic intervention in order to improve the following deficits and impairments:  Abnormal gait, Decreased activity tolerance, Decreased balance, Decreased mobility, Difficulty walking, Impaired flexibility, Decreased endurance, Pain  Visit Diagnosis: Muscle weakness (generalized)  Difficulty in walking, not elsewhere classified  Repeated falls  Chronic pain of right knee  Abnormal posture     Problem List Patient Active Problem List   Diagnosis Date Noted  . Urinary incontinence 10/14/2015  . Encounter for Medicare annual wellness exam 12/06/2014  . HX: breast cancer  06/29/2014  . Hypertension   . Vitamin D deficiency   . Spinal stenosis, lumbar region, with  neurogenic claudication 03/28/2013    Class: Chronic  . At high risk for falls 02/23/2013  . Systolic heart failure ([place]) 06/07/2010  . Congestive dilated cardiomyopathy ([place]) 05/29/2010  . Atrial fibrillation ([place]) 04/16/2009  . Elevated lipids 04/13/2009  . Osteoarthritis 04/13/2009    Ewald Beg 06/19/2016, 1:26 PM  [facility] [address] [place], [place], [address] Phone: [phone]   Fax:  [phone]  Name: Pamela Whitaker MRN: [id number] Date of Birth: Mar 30, 1933  Raeford Razor, PT 06/19/16 1:26 PM Phone: [phone] Fax: [phone]

## 2016-06-24 ENCOUNTER — Ambulatory Visit: Payer: Medicare Other | Admitting: Physical Therapy

## 2016-06-24 DIAGNOSIS — M25561 Pain in right knee: Secondary | ICD-10-CM

## 2016-06-24 DIAGNOSIS — M6281 Muscle weakness (generalized): Secondary | ICD-10-CM | POA: Diagnosis not present

## 2016-06-24 DIAGNOSIS — R296 Repeated falls: Secondary | ICD-10-CM

## 2016-06-24 DIAGNOSIS — R293 Abnormal posture: Secondary | ICD-10-CM

## 2016-06-24 DIAGNOSIS — G8929 Other chronic pain: Secondary | ICD-10-CM

## 2016-06-24 DIAGNOSIS — R262 Difficulty in walking, not elsewhere classified: Secondary | ICD-10-CM

## 2016-06-24 NOTE — Therapy (Signed)
[place], [place], [address] Phone: [phone]   Fax:  [phone]  Physical Therapy Treatment  Patient Details  Name: Pamela Whitaker MRN: [id number] Date of Birth: May 10, 1933 Referring Provider: Dr. Gerrit Halls   Encounter Date: 06/24/2016      PT End of Session - 06/24/16 1749    Visit Number 8   Number of Visits 16   Date for PT Re-Evaluation 07/16/16   PT Start Time 1500   PT Stop Time 1549   PT Time Calculation (min) 49 min   Activity Tolerance Patient tolerated treatment well   Behavior During Therapy [facility] for tasks assessed/performed      Past Medical History:  Diagnosis Date  . Atrial fibrillation ([place])    treated with multaq x 6 mos in 2011....[name]=2 (Age; + CHF; no CVA; no DM2). Echo 05/24/10: EF 30% mild AS(mean 11mgHg); mild LAE; mod LAE; PASP 35 Systolic CHF  . Breast cancer ([place]     in 1987 with a left  mastectomy  . Cardiomyopathy    a. tachy mediated Myoview 06/06/10:  EF 31%; no scar or ischemia; EF up to 50% per echo November 2013  . Diverticulosis   . DJD (degenerative joint disease) of knee    right knee  . Falls   . Hypercholesteremia   . Hypertension   . Lumbar stenosis   . Osteoporosis    history of bone chips in the right knee,previous right fibular fracture, operated on by Dr. NVeverly Fells . RLS (restless legs syndrome)   . Vitamin D deficiency     Past Surgical History:  Procedure Laterality Date  . BREAST RECONSTRUCTION  1988   left breast  . CARDIOVERSION  2012  . KNEE ARTHROSCOPY  2005  . MASTECTOMY, RADICAL  1987   left breast  . WRIST FRACTURE SURGERY     left wrist    There were no vitals filed for this visit.      Subjective Assessment - 06/24/16 1507    Subjective No real pain today.  I want do things that will help with my balance.  I want to strengthen my ankles.  I have insoles and they may pinch my foot.   Currently in Pain? No/denies   just normal aches.   Pain Score 0-No pain   Pain Location Knee   Pain Orientation Left   Pain Descriptors / Indicators Aching   Pain Frequency Intermittent   Pain Relieving Factors injection,  walker                         [facility] Adult PT Treatment/Exercise - 06/24/16 0001      High Level Balance   High Level Balance Comments in bars, No hands with close SBA single leg stand with toe taps 5 x each forward, to the side and behind 5 X each direction     Self-Care   Other Self-Care Comments  gait improved with more neutral foot with increased toe off with the addition of 1 layer cardboard support to [employer]  pad removed at end of session,  Skin pink,  toe pain decreased with pad.      Knee/Hip Exercises: Aerobic   Nustep L7 UE and LE 9 min      Knee/Hip Exercises: Standing   Hip Abduction Stengthening;Both;1 set;10 reps   Forward Step Up Limitations on 4 inch steps 5 X each ,  increased knee pain  so stopped. 2 hands   Other Standing Knee Exercises 2 steps with slight squat in bars 5 X,  cues, feet forward,  small steps.  some pain and fatigue.      Moist Heat Therapy   Number Minutes Moist Heat 8 Minutes     Cryotherapy   Number Minutes Cryotherapy 8 Minutes   Cryotherapy Location Knee   Type of Cryotherapy --  cold pack, legs elevated                  PT Short Term Goals - 06/19/16 1130      PT SHORT TERM GOAL #1   Title "Independent with initial HEP   Status Achieved     PT SHORT TERM GOAL #2   Title Pt will complete Berg Balance Screen and understand results, modify home if needed.    Baseline received RW    Status Achieved     PT SHORT TERM GOAL #3   Title Pt will complete sit to stand with greater ease at normal chair heights.     Status On-going     PT SHORT TERM GOAL #4   Title Pt will complete Assessment of Balance Confidence and set goal.    Baseline completed but unable to find results    Status Deferred     PT SHORT TERM  GOAL #5   Title Pt will perform BERG and set long term goal for reduction of fall risk   Status Achieved           PT Long Term Goals - 06/24/16 1754      PT LONG TERM GOAL #1   Title Pt will improve balance confidence per patient report in home environment.    Baseline working on   Time 8   Period Weeks   Status On-going     PT LONG TERM GOAL #2   Title Patient will report no pain in right knee with ambulation in church, up to 30 min.    Time 8   Period Weeks   Status Unable to assess     PT LONG TERM GOAL #3   Title Berg Score will improve by 6 or more points to demo improved fall risk.    Time 8   Period Weeks   Status Unable to assess     PT LONG TERM GOAL #4   Title Pt will improve hip abduction to 4/5 or more to aid in gait stability.    Time 8   Period Weeks   Status Unable to assess     PT LONG TERM GOAL #5   Title Pt will be able to control knee hyperxtension with gait with min to no cueing.    Time 8   Period Weeks   Status On-going     PT LONG TERM GOAL #6   Title Pt will improve BERG to at least 40/56 from 20/56 to reduce risk of fall using assistive device (duplicate)    Time 8   Period Weeks   Status Unable to assess               Plan - 06/24/16 1750    Clinical Impression Statement 4-5/10 knee pain at end of exercise, trial of met arch support was helpful,in decreasing pain at great toe.  however it was tight so it was removed at the end of the session.  She may benifit from orthotic  visit to see if modifications are possible.  Current insoles  are 81 years old.   No new goals,  Objective finding :Improved gait  with less ankle pain and great toe pain.    PT Next Visit Plan Continue  step ups and progress strength and balance, gait    PT Home Exercise Plan level 1-2 knee and added clam in sidelying bridge and knee to chest    Recommended Other Services Consider orthotic consult.   Consulted and Agree with Plan of Care Patient       Patient will benefit from skilled therapeutic intervention in order to improve the following deficits and impairments:  Abnormal gait, Decreased activity tolerance, Decreased balance, Decreased mobility, Difficulty walking, Impaired flexibility, Decreased endurance, Pain  Visit Diagnosis: Muscle weakness (generalized)  Difficulty in walking, not elsewhere classified  Repeated falls  Chronic pain of right knee  Abnormal posture     Problem List Patient Active Problem List   Diagnosis Date Noted  . Urinary incontinence 10/14/2015  . Encounter for Medicare annual wellness exam 12/06/2014  . HX: breast cancer 06/29/2014  . Hypertension   . Vitamin D deficiency   . Spinal stenosis, lumbar region, with neurogenic claudication 03/28/2013    Class: Chronic  . At high risk for falls 02/23/2013  . Systolic heart failure ([place]) 06/07/2010  . Congestive dilated cardiomyopathy ([place]) 05/29/2010  . Atrial fibrillation ([place]) 04/16/2009  . Elevated lipids 04/13/2009  . Osteoarthritis 04/13/2009    HARRIS,KAREN PTA 06/24/2016, 5:56 PM  [facility] [address] [place], [place], [address] Phone: [phone]   Fax:  [phone]  Name: NIKEYA MAXIM MRN: [id number] Date of Birth: [birth year]

## 2016-06-25 ENCOUNTER — Ambulatory Visit: Payer: Medicare Other | Admitting: Physical Therapy

## 2016-06-25 DIAGNOSIS — M6281 Muscle weakness (generalized): Secondary | ICD-10-CM

## 2016-06-25 DIAGNOSIS — R262 Difficulty in walking, not elsewhere classified: Secondary | ICD-10-CM

## 2016-06-25 DIAGNOSIS — G8929 Other chronic pain: Secondary | ICD-10-CM

## 2016-06-25 DIAGNOSIS — R293 Abnormal posture: Secondary | ICD-10-CM

## 2016-06-25 DIAGNOSIS — R296 Repeated falls: Secondary | ICD-10-CM

## 2016-06-25 DIAGNOSIS — M25561 Pain in right knee: Secondary | ICD-10-CM

## 2016-06-25 NOTE — Therapy (Signed)
[place], [place], [address] Phone: [phone]   Fax:  [phone]  Physical Therapy Treatment  Patient Details  Name: Pamela Whitaker MRN: [id number] Date of Birth: 09-24-1932 Referring Provider: Dr. Gerrit Halls   Encounter Date: 06/25/2016      PT End of Session - 06/25/16 1802    Visit Number 9   Number of Visits 16   Date for PT Re-Evaluation 07/16/16   PT Start Time 2197   PT Stop Time 5883   PT Time Calculation (min) 56 min   Activity Tolerance Patient tolerated treatment well   Behavior During Therapy [facility] for tasks assessed/performed      Past Medical History:  Diagnosis Date  . Atrial fibrillation ([place])    treated with multaq x 6 mos in 2011....[name]=2 (Age; + CHF; no CVA; no DM2). Echo 05/24/10: EF 30% mild AS(mean 52mgHg); mild LAE; mod LAE; PASP 35 Systolic CHF  . Breast cancer ([place]     in 1987 with a left  mastectomy  . Cardiomyopathy    a. tachy mediated Myoview 06/06/10:  EF 31%; no scar or ischemia; EF up to 50% per echo November 2013  . Diverticulosis   . DJD (degenerative joint disease) of knee    right knee  . Falls   . Hypercholesteremia   . Hypertension   . Lumbar stenosis   . Osteoporosis    history of bone chips in the right knee,previous right fibular fracture, operated on by Dr. NVeverly Fells . RLS (restless legs syndrome)   . Vitamin D deficiency     Past Surgical History:  Procedure Laterality Date  . BREAST RECONSTRUCTION  1988   left breast  . CARDIOVERSION  2012  . KNEE ARTHROSCOPY  2005  . MASTECTOMY, RADICAL  1987   left breast  . WRIST FRACTURE SURGERY     left wrist    There were no vitals filed for this visit.      Subjective Assessment - 06/25/16 1616    Subjective No pain,  I am walking better   Currently in Pain? No/denies                         [facility] - 06/25/16 0001      Knee/Hip  Exercises: Aerobic   Nustep L7 UE and LE 9 min   cued to keep knees apart.     Knee/Hip Exercises: Standing   Hip Abduction 10 reps;2 sets;Both   Abduction Limitations monitored for hip to be neutral vs IR,  min assist intermittantly.   Other Standing Knee Exercises weight shifting with hands sliding up wall 5 X each CGA/Min assist for safety.     Knee/Hip Exercises: Seated   Clamshell with TheraBand Red  single, both 10 x  sets     Knee/Hip Exercises: Supine   Bridges 10 reps;20 reps   Other Supine Knee/Hip Exercises Clams 2 sets 10 , green band     Knee/Hip Exercises: Sidelying   Clams 5 X 3 sets,  moderate assist to hold hips in neutral,  both     Moist Heat Therapy   Number Minutes Moist Heat 7 Minutes   Moist Heat Location Lumbar Spine     Cryotherapy   Number Minutes Cryotherapy 7 Minutes   Cryotherapy Location Knee   Type of Cryotherapy --  cold pack, both , legs elevated  PT Short Term Goals - 06/25/16 1805      PT SHORT TERM GOAL #1   Title "Independent with initial HEP   Status Achieved     PT SHORT TERM GOAL #2   Title Pt will complete Berg Balance Screen and understand results, modify home if needed.    Status Achieved     PT SHORT TERM GOAL #3   Title Pt will complete sit to stand with greater ease at normal chair heights.     Baseline less effort reported   Time 4   Period Weeks   Status On-going           PT Long Term Goals - 06/24/16 1754      PT LONG TERM GOAL #1   Title Pt will improve balance confidence per patient report in home environment.    Baseline working on   Time 8   Period Weeks   Status On-going     PT LONG TERM GOAL #2   Title Patient will report no pain in right knee with ambulation in church, up to 30 min.    Time 8   Period Weeks   Status Unable to assess     PT LONG TERM GOAL #3   Title Berg Score will improve by 6 or more points to demo improved fall risk.    Time 8   Period Weeks    Status Unable to assess     PT LONG TERM GOAL #4   Title Pt will improve hip abduction to 4/5 or more to aid in gait stability.    Time 8   Period Weeks   Status Unable to assess     PT LONG TERM GOAL #5   Title Pt will be able to control knee hyperxtension with gait with min to no cueing.    Time 8   Period Weeks   Status On-going     PT LONG TERM GOAL #6   Title Pt will improve BERG to at least 40/56 from 20/56 to reduce risk of fall using assistive device (duplicate)    Time 8   Period Weeks   Status Unable to assess               Plan - 06/25/16 1802    Clinical Impression Statement Hip strengthening was focus .  Gait continues to improve,  more upright posture and increased use of hip musculature noted, for short durations in clinic,  reminders intermittantly needed.   No new goals met.    PT Next Visit Plan continue wall slides facing wall,  hip strengthening.gait   PT Home Exercise Plan level 1-2 knee and added clam in sidelying bridge and knee to chest    Consulted and Agree with Plan of Care Patient      Patient will benefit from skilled therapeutic intervention in order to improve the following deficits and impairments:  Abnormal gait, Decreased activity tolerance, Decreased balance, Decreased mobility, Difficulty walking, Impaired flexibility, Decreased endurance, Pain  Visit Diagnosis: Muscle weakness (generalized)  Difficulty in walking, not elsewhere classified  Repeated falls  Chronic pain of right knee  Abnormal posture     Problem List Patient Active Problem List   Diagnosis Date Noted  . Urinary incontinence 10/14/2015  . Encounter for Medicare annual wellness exam 12/06/2014  . HX: breast cancer 06/29/2014  . Hypertension   . Vitamin D deficiency   . Spinal stenosis, lumbar region, with neurogenic claudication 03/28/2013  Class: Chronic  . At high risk for falls 02/23/2013  . Systolic heart failure ([place]) 06/07/2010  . Congestive  dilated cardiomyopathy ([place]) 05/29/2010  . Atrial fibrillation ([place]) 04/16/2009  . Elevated lipids 04/13/2009  . Osteoarthritis 04/13/2009    HARRIS,KAREN PTA 06/25/2016, 6:06 PM  [facility] [address] [place], [place], [address] Phone: [phone]   Fax:  [phone]  Name: Pamela Whitaker MRN: [id number] Date of Birth: [birth year]

## 2016-07-01 ENCOUNTER — Other Ambulatory Visit (HOSPITAL_COMMUNITY): Payer: Medicare Other

## 2016-07-01 ENCOUNTER — Ambulatory Visit: Payer: Medicare Other | Attending: Physician Assistant | Admitting: Physical Therapy

## 2016-07-01 ENCOUNTER — Ambulatory Visit (HOSPITAL_COMMUNITY): Payer: Medicare Other | Admitting: Hematology & Oncology

## 2016-07-01 DIAGNOSIS — R293 Abnormal posture: Secondary | ICD-10-CM | POA: Diagnosis present

## 2016-07-01 DIAGNOSIS — G8929 Other chronic pain: Secondary | ICD-10-CM | POA: Diagnosis present

## 2016-07-01 DIAGNOSIS — R262 Difficulty in walking, not elsewhere classified: Secondary | ICD-10-CM | POA: Insufficient documentation

## 2016-07-01 DIAGNOSIS — M25561 Pain in right knee: Secondary | ICD-10-CM | POA: Diagnosis present

## 2016-07-01 DIAGNOSIS — M6281 Muscle weakness (generalized): Secondary | ICD-10-CM

## 2016-07-01 DIAGNOSIS — R296 Repeated falls: Secondary | ICD-10-CM | POA: Diagnosis present

## 2016-07-02 DIAGNOSIS — M6281 Muscle weakness (generalized): Secondary | ICD-10-CM | POA: Diagnosis not present

## 2016-07-02 NOTE — Therapy (Signed)
[place], [place], [address] Phone: [phone]   Fax:  [phone]  Physical Therapy Treatment  Patient Details  Name: Pamela Whitaker MRN: [id number] Date of Birth: 07/07/1932 Referring Provider: Dr. Gerrit Halls   Encounter Date: 07/01/2016      PT End of Session - 07/01/16 1504    Visit Number 10   Number of Visits 16   Date for PT Re-Evaluation 07/16/16   Authorization Type UHC MCR complete    PT Start Time 1500   PT Stop Time 1600   PT Time Calculation (min) 60 min   Activity Tolerance Patient tolerated treatment well   Behavior During Therapy Pamela Whitaker for tasks assessed/performed      Past Medical History:  Diagnosis Date  . Atrial fibrillation ([place])    treated with multaq x 6 mos in 2011....[name]=2 (Age; + CHF; no CVA; no DM2). Echo 05/24/10: EF 30% mild AS(mean 16mmgHg); mild LAE; mod LAE; PASP 35 Systolic CHF  . Breast cancer ([place])     in 1987 with a left  mastectomy  . Cardiomyopathy    a. tachy mediated Myoview 06/06/10:  EF 31%; no scar or ischemia; EF up to 50% per echo November 2013  . Diverticulosis   . DJD (degenerative joint disease) of knee    right knee  . Falls   . Hypercholesteremia   . Hypertension   . Lumbar stenosis   . Osteoporosis    history of bone chips in the right knee,previous right fibular fracture, operated on by Dr. Veverly Fells  . RLS (restless legs syndrome)   . Vitamin D deficiency     Past Surgical History:  Procedure Laterality Date  . BREAST RECONSTRUCTION  1988   left breast  . CARDIOVERSION  2012  . KNEE ARTHROSCOPY  2005  . MASTECTOMY, RADICAL  1987   left breast  . WRIST FRACTURE SURGERY     left wrist    There were no vitals filed for this visit.      Subjective Assessment - 07/01/16 1506    Subjective I may try and get an orthotic to correct my toe (R).  After last session I had a lot of pain and soreness in L  hips and Rt. knee.     Currently in Pain? Yes   Pain Score 1    Pain Location Knee   Pain Orientation Right   Pain Descriptors / Indicators Sore   Pain Type Chronic pain   Pain Onset More than a month ago   Pain Frequency Intermittent   Aggravating Factors  WB and bending    Pain Relieving Factors walker, rest                          [facility] Adult PT Treatment/Exercise - 07/01/16 0001      Knee/Hip Exercises: Aerobic   Nustep LE only with greenband to encourage hip abd 5 min      Knee/Hip Exercises: Standing   Heel Raises Both;1 set;20 reps   Heel Raises Limitations ball between ankles    Hip Abduction Stengthening;Both;1 set;15 reps   Other Standing Knee Exercises weight shifting with hands sliding up wall 5 X each CGA/Min assist for safety.   Other Standing Knee Exercises practiced lifting arches in standing , encouaged glute activation with gait      Knee/Hip Exercises: Supine   Hip Adduction Isometric Strengthening;Both;1 set   Bridges with Diona Foley  Squeeze Strengthening;Both;1 set;10 reps   Other Supine Knee/Hip Exercises bridge with UE pull x 10    Other Supine Knee/Hip Exercises glute setx 10      Knee/Hip Exercises: Sidelying   Clams green band each leg x 10      Moist Heat Therapy   Number Minutes Moist Heat 10 Minutes   Moist Heat Location Lumbar Spine     and Ice packs bilateral knees for 10 min              PT Short Term Goals - 06/25/16 1805      PT SHORT TERM GOAL #1   Title "Independent with initial HEP   Status Achieved     PT SHORT TERM GOAL #2   Title Pt will complete Berg Balance Screen and understand results, modify home if needed.    Status Achieved     PT SHORT TERM GOAL #3   Title Pt will complete sit to stand with greater ease at normal chair heights.     Baseline less effort reported   Time 4   Period Weeks   Status On-going           PT Long Term Goals - 06/24/16 1754      PT LONG TERM GOAL #1   Title Pt will improve balance  confidence per patient report in home environment.    Baseline working on   Time 8   Period Weeks   Status On-going     PT LONG TERM GOAL #2   Title Patient will report no pain in right knee with ambulation in church, up to 30 min.    Time 8   Period Weeks   Status Unable to assess     PT LONG TERM GOAL #3   Title Berg Score will improve by 6 or more points to demo improved fall risk.    Time 8   Period Weeks   Status Unable to assess     PT LONG TERM GOAL #4   Title Pt will improve hip abduction to 4/5 or more to aid in gait stability.    Time 8   Period Weeks   Status Unable to assess     PT LONG TERM GOAL #5   Title Pt will be able to control knee hyperxtension with gait with min to no cueing.    Time 8   Period Weeks   Status On-going     PT LONG TERM GOAL #6   Title Pt will improve BERG to at least 40/56 from 20/56 to reduce risk of fall using assistive device (duplicate)    Time 8   Period Weeks   Status Unable to assess               Plan - 07/02/16 0540    Clinical Impression Statement Patient walking more upright and also more aware of her posture as she moves in clinic.  She continues with weakness in hips.  Does HEP inconsistently.  We discussed POC, she may finish next week as she is going out of town.  She was agreeable and will have HEP and water aerobics class (2 times per week) to continue conditioning.    PT Next Visit Plan continue wall slides facing wall,  hip strengthening.gait ask specific goals.    PT Home Exercise Plan level 1-2 knee and added clam in sidelying bridge and knee to chest    Consulted and Agree with Plan of Care Patient  Patient will benefit from skilled therapeutic intervention in order to improve the following deficits and impairments:  Abnormal gait, Decreased activity tolerance, Decreased balance, Decreased mobility, Difficulty walking, Impaired flexibility, Decreased endurance, Pain  Visit Diagnosis: Muscle weakness  (generalized)  Difficulty in walking, not elsewhere classified  Repeated falls  Chronic pain of right knee  Abnormal posture       G-Codes - 2016-07-24 0536    Functional Assessment Tool Used clinical judgement    Functional Limitation Mobility: Walking and moving around   Mobility: Walking and Moving Around Current Status [phone]) At least 40 percent but less than 60 percent impaired, limited or restricted   Mobility: Walking and Moving Around Goal Status [phone]) At least 40 percent but less than 60 percent impaired, limited or restricted      Problem List Patient Active Problem List   Diagnosis Date Noted  . Urinary incontinence 10/14/2015  . Encounter for Medicare annual wellness exam 12/06/2014  . HX: breast cancer 06/29/2014  . Hypertension   . Vitamin D deficiency   . Spinal stenosis, lumbar region, with neurogenic claudication 03/28/2013    Class: Chronic  . At high risk for falls 02/23/2013  . Systolic heart failure ([place]) 06/07/2010  . Congestive dilated cardiomyopathy ([place]) 05/29/2010  . Atrial fibrillation ([place]) 04/16/2009  . Elevated lipids 04/13/2009  . Osteoarthritis 04/13/2009    PAA,JENNIFER 24-Jul-2016, 5:44 AM  [facility] [address] [place], [place], [address] Phone: [phone]   Fax:  [phone]  Name: Pamela Whitaker MRN: [id number] Date of Birth: Dec 17, 1932  Raeford Razor, PT 07/24/2016 5:44 AM Phone: [phone] Fax: [phone]

## 2016-07-03 ENCOUNTER — Ambulatory Visit: Payer: Medicare Other | Admitting: Physical Therapy

## 2016-07-03 DIAGNOSIS — G8929 Other chronic pain: Secondary | ICD-10-CM

## 2016-07-03 DIAGNOSIS — M6281 Muscle weakness (generalized): Secondary | ICD-10-CM

## 2016-07-03 DIAGNOSIS — M25561 Pain in right knee: Secondary | ICD-10-CM

## 2016-07-03 DIAGNOSIS — R293 Abnormal posture: Secondary | ICD-10-CM

## 2016-07-03 DIAGNOSIS — R296 Repeated falls: Secondary | ICD-10-CM

## 2016-07-03 DIAGNOSIS — R262 Difficulty in walking, not elsewhere classified: Secondary | ICD-10-CM

## 2016-07-03 NOTE — Therapy (Signed)
[place], [place], [address] Phone: [phone]   Fax:  [phone]  Physical Therapy Treatment  Patient Details  Name: Pamela Whitaker MRN: [id number] Date of Birth: Nov 04, 1932 Referring Provider: Dr. Gerrit Halls   Encounter Date: 07/03/2016      PT End of Session - 07/03/16 1049    Visit Number 11   Number of Visits 16   Date for PT Re-Evaluation 07/16/16   Authorization Type UHC MCR complete    Authorization Time Period No visit limit    PT Start Time 1016   PT Stop Time 1105   PT Time Calculation (min) 49 min   Activity Tolerance Patient tolerated treatment well   Behavior During Therapy [facility] for tasks assessed/performed      Past Medical History:  Diagnosis Date  . Atrial fibrillation ([place])    treated with multaq x 6 mos in 2011....[name]=2 (Age; + CHF; no CVA; no DM2). Echo 05/24/10: EF 30% mild AS(mean 77mmgHg); mild LAE; mod LAE; PASP 35 Systolic CHF  . Breast cancer ([place])     in 1987 with a left  mastectomy  . Cardiomyopathy    a. tachy mediated Myoview 06/06/10:  EF 31%; no scar or ischemia; EF up to 50% per echo November 2013  . Diverticulosis   . DJD (degenerative joint disease) of knee    right knee  . Falls   . Hypercholesteremia   . Hypertension   . Lumbar stenosis   . Osteoporosis    history of bone chips in the right knee,previous right fibular fracture, operated on by Dr. Veverly Fells  . RLS (restless legs syndrome)   . Vitamin D deficiency     Past Surgical History:  Procedure Laterality Date  . BREAST RECONSTRUCTION  1988   left breast  . CARDIOVERSION  2012  . KNEE ARTHROSCOPY  2005  . MASTECTOMY, RADICAL  1987   left breast  . WRIST FRACTURE SURGERY     left wrist    There were no vitals filed for this visit.      Subjective Assessment - 07/03/16 1308    Subjective No pain today.  Feeling better all over.  My knee isnt hurting but I know its there.    Currently in Pain? No/denies             [facility] Adult PT Treatment/Exercise - 07/03/16 1058      Berg Balance Test   Sit to Stand Able to stand  independently using hands   Standing Unsupported Able to stand 2 minutes with supervision   Sitting with Back Unsupported but Feet Supported on Floor or Stool Able to sit safely and securely 2 minutes   Stand to Sit Controls descent by using hands  last min "flop"   Transfers Able to transfer safely, definite need of hands   Standing Unsupported with Eyes Closed Able to stand 10 seconds with supervision   Standing Ubsupported with Feet Together Needs help to attain position but able to stand for 30 seconds with feet together   From Standing, Reach Forward with Outstretched Arm Can reach forward >5 cm safely (2")   From Standing Position, Pick up Object from Floor Unable to pick up shoe, but reaches 2-5 cm (1-2") from shoe and balances independently  used chair for A and PT    From Standing Position, Turn to Look Behind Over each Shoulder Needs supervision when turning   Turn 360 Degrees Needs assistance while  turning   Standing Unsupported, Alternately Place Feet on Step/Stool Needs assistance to keep from falling or unable to try   Standing Unsupported, One Foot in [employer] balance while stepping or standing   Standing on One Leg Unable to try or needs assist to prevent fall   Total Score 25     Lumbar Exercises: Supine   Clam 15 reps   Bent Knee Raise 15 reps   Bridge 10 reps   Straight Leg Raise 10 reps   Other Supine Lumbar Exercises used ball under sacrum to destabilize core    Other Supine Lumbar Exercises UE alternating arms x 20      Moist Heat Therapy   Number Minutes Moist Heat --  declined                 PT Education - 07/03/16 1309    Education provided Yes   Education Details Berg score, improvement, renewal as an option, core    Person(s) Educated Patient   Methods Explanation;Demonstration    Comprehension Verbalized understanding;Returned demonstration;Need further instruction          PT Short Term Goals - 07/03/16 1311      PT SHORT TERM GOAL #1   Title "Independent with initial HEP   Status Achieved     PT SHORT TERM GOAL #2   Title Pt will complete Berg Balance Screen and understand results, modify home if needed.    Status Achieved     PT SHORT TERM GOAL #3   Title Pt will complete sit to stand with greater ease at normal chair heights.     Status Achieved     PT SHORT TERM GOAL #4   Title Pt will complete Assessment of Balance Confidence and set goal.    Baseline completed but unable to find results    Status Deferred     PT SHORT TERM GOAL #5   Title Pt will perform BERG and set long term goal for reduction of fall risk   Status Achieved           PT Long Term Goals - 07/03/16 1311      PT LONG TERM GOAL #1   Title Pt will improve balance confidence per patient report in home environment.    Status Unable to assess     PT LONG TERM GOAL #2   Title Patient will report no pain in right knee with ambulation in church, up to 30 min.    Status On-going     PT LONG TERM GOAL #3   Title Berg Score will improve by 6 or more points to demo improved fall risk.    Status Achieved     PT LONG TERM GOAL #4   Title Pt will improve hip abduction to 4/5 or more to aid in gait stability.    Status Unable to assess     PT LONG TERM GOAL #5   Title Pt will be able to control knee hyperxtension with gait with min to no cueing.    Status On-going     PT LONG TERM GOAL #6   Status Deferred               Plan - 07/03/16 1309    Clinical Impression Statement Patient improved Berg score by 10 points but still very unstable in standing without UE assist. She was pleased to know her score.  Assured her to continue using her new RW. Worked core in supine  and showed significant weakness with L stabilization ex.    PT Next Visit Plan continue wall slides  facing wall,  hip strengthening.gait ask specific goals.    PT Home Exercise Plan level 1-2 knee and added clam in sidelying bridge and knee to chest    Consulted and Agree with Plan of Care Patient      Patient will benefit from skilled therapeutic intervention in order to improve the following deficits and impairments:  Abnormal gait, Decreased activity tolerance, Decreased balance, Decreased mobility, Difficulty walking, Impaired flexibility, Decreased endurance, Pain  Visit Diagnosis: Muscle weakness (generalized)  Difficulty in walking, not elsewhere classified  Repeated falls  Chronic pain of right knee  Abnormal posture       G-Codes - 29-Jul-2016 0536    Functional Assessment Tool Used clinical judgement    Functional Limitation Mobility: Walking and moving around   Mobility: Walking and Moving Around Current Status [phone]) At least 40 percent but less than 60 percent impaired, limited or restricted   Mobility: Walking and Moving Around Goal Status [phone]) At least 40 percent but less than 60 percent impaired, limited or restricted      Problem List Patient Active Problem List   Diagnosis Date Noted  . Urinary incontinence 10/14/2015  . Encounter for Medicare annual wellness exam 12/06/2014  . HX: breast cancer 06/29/2014  . Hypertension   . Vitamin D deficiency   . Spinal stenosis, lumbar region, with neurogenic claudication 03/28/2013    Class: Chronic  . At high risk for falls 02/23/2013  . Systolic heart failure ([place]) 06/07/2010  . Congestive dilated cardiomyopathy ([place]) 05/29/2010  . Atrial fibrillation ([place]) 04/16/2009  . Elevated lipids 04/13/2009  . Osteoarthritis 04/13/2009    PAA,JENNIFER 07/03/2016, 1:15 PM  [facility] [address] [place], [place], [address] Phone: [phone]   Fax:  [phone]  Name: Pamela Whitaker MRN: [id number] Date of Birth: 03-17-1933  Raeford Razor, PT 07/03/16 1:15  PM Phone: [phone] Fax: [phone]

## 2016-07-08 ENCOUNTER — Ambulatory Visit: Payer: Medicare Other | Admitting: Physical Therapy

## 2016-07-08 DIAGNOSIS — M6281 Muscle weakness (generalized): Secondary | ICD-10-CM

## 2016-07-08 DIAGNOSIS — R262 Difficulty in walking, not elsewhere classified: Secondary | ICD-10-CM

## 2016-07-08 DIAGNOSIS — R293 Abnormal posture: Secondary | ICD-10-CM

## 2016-07-08 DIAGNOSIS — M25561 Pain in right knee: Secondary | ICD-10-CM

## 2016-07-08 DIAGNOSIS — R296 Repeated falls: Secondary | ICD-10-CM

## 2016-07-08 DIAGNOSIS — G8929 Other chronic pain: Secondary | ICD-10-CM

## 2016-07-08 NOTE — Therapy (Signed)
[place], [place], [address] Phone: [phone]   Fax:  [phone]  Physical Therapy Treatment  Patient Details  Name: Pamela Whitaker MRN: [id number] Date of Birth: 25-Aug-1932 Referring Provider: Dr. Gerrit Halls   Encounter Date: 07/08/2016      PT End of Session - 07/08/16 1231    Visit Number 12   Number of Visits 16   Date for PT Re-Evaluation 07/16/16   PT Start Time [id number]   PT Stop Time 1246   PT Time Calculation (min) 58 min   Activity Tolerance Patient tolerated treatment well   Behavior During Therapy [facility] for tasks assessed/performed      Past Medical History:  Diagnosis Date  . Atrial fibrillation ([place])    treated with multaq x 6 mos in 2011....[name]=2 (Age; + CHF; no CVA; no DM2). Echo 05/24/10: EF 30% mild AS(mean 2mmgHg); mild LAE; mod LAE; PASP 35 Systolic CHF  . Breast cancer ([place])     in 1987 with a left  mastectomy  . Cardiomyopathy    a. tachy mediated Myoview 06/06/10:  EF 31%; no scar or ischemia; EF up to 50% per echo November 2013  . Diverticulosis   . DJD (degenerative joint disease) of knee    right knee  . Falls   . Hypercholesteremia   . Hypertension   . Lumbar stenosis   . Osteoporosis    history of bone chips in the right knee,previous right fibular fracture, operated on by Dr. Veverly Fells  . RLS (restless legs syndrome)   . Vitamin D deficiency     Past Surgical History:  Procedure Laterality Date  . BREAST RECONSTRUCTION  1988   left breast  . CARDIOVERSION  2012  . KNEE ARTHROSCOPY  2005  . MASTECTOMY, RADICAL  1987   left breast  . WRIST FRACTURE SURGERY     left wrist    There were no vitals filed for this visit.      Subjective Assessment - 07/08/16 1151    Subjective I feel good today.  I had some pain this weekend.  Probably sitting too much.    Currently in Pain? No/denies              [facility] Adult PT Treatment/Exercise - 07/08/16  1155      Knee/Hip Exercises: Standing   Hip Abduction Stengthening;Both;1 set;10 reps;15 reps   Hip Extension Stengthening;Both;1 set;10 reps   Other Standing Knee Exercises weight shifting with hands sliding up wall 5 X each CGA/Min assist for safety.   Other Standing Knee Exercises sit to stand x15 (used blue foam to boost)  with ball for alignment      Knee/Hip Exercises: Seated   Long Arc Quad Strengthening;Both;1 set;15 reps   Long Arc Quad Limitations 4 lbs   Ball Squeeze x 20    Knee/Hip Flexion march x 20 , 4 lbs      Knee/Hip Exercises: Supine   Bridges with [employer] Strengthening;Both;1 set;15 reps   Other Supine Knee/Hip Exercises clam with green band x 20 supine bilateral and did a few unilateral.      Shoulder Exercises: Standing   Extension Strengthening;Both;10 reps;Theraband   Theraband Level (Shoulder Extension) Level 1 (Yellow)   Row Strengthening;Both;10 reps   Theraband Level (Shoulder Row) Level 1 (Yellow)                PT Education - 07/08/16 1231    Education provided Yes  Education Details balance and posture with UE ther ex   Person(s) Educated Patient   Methods Explanation;Verbal cues;Tactile cues   Comprehension Verbalized understanding          PT Short Term Goals - 07/03/16 1311      PT SHORT TERM GOAL #1   Title "Independent with initial HEP   Status Achieved     PT SHORT TERM GOAL #2   Title Pt will complete Berg Balance Screen and understand results, modify home if needed.    Status Achieved     PT SHORT TERM GOAL #3   Title Pt will complete sit to stand with greater ease at normal chair heights.     Status Achieved     PT SHORT TERM GOAL #4   Title Pt will complete Assessment of Balance Confidence and set goal.    Baseline completed but unable to find results    Status Deferred     PT SHORT TERM GOAL #5   Title Pt will perform BERG and set long term goal for reduction of fall risk   Status Achieved            PT Long Term Goals - 07/03/16 1311      PT LONG TERM GOAL #1   Title Pt will improve balance confidence per patient report in home environment.    Status Unable to assess     PT LONG TERM GOAL #2   Title Patient will report no pain in right knee with ambulation in church, up to 30 min.    Status On-going     PT LONG TERM GOAL #3   Title Berg Score will improve by 6 or more points to demo improved fall risk.    Status Achieved     PT LONG TERM GOAL #4   Title Pt will improve hip abduction to 4/5 or more to aid in gait stability.    Status Unable to assess     PT LONG TERM GOAL #5   Title Pt will be able to control knee hyperxtension with gait with min to no cueing.    Status On-going     PT LONG TERM GOAL #6   Status Deferred               Plan - 07/08/16 1246    Clinical Impression Statement Worked on functional strengthening with min rest breaks.  Improved stride length after band/balance exercises.    PT Next Visit Plan continue wall slides facing wall,  hip strengthening.gait ask specific goals.    PT Home Exercise Plan level 1-2 knee and added clam in sidelying bridge and knee to chest    Consulted and Agree with Plan of Care Patient      Patient will benefit from skilled therapeutic intervention in order to improve the following deficits and impairments:  Abnormal gait, Decreased activity tolerance, Decreased balance, Decreased mobility, Difficulty walking, Impaired flexibility, Decreased endurance, Pain  Visit Diagnosis: Muscle weakness (generalized)  Difficulty in walking, not elsewhere classified  Repeated falls  Chronic pain of right knee  Abnormal posture     Problem List Patient Active Problem List   Diagnosis Date Noted  . Urinary incontinence 10/14/2015  . Encounter for Medicare annual wellness exam 12/06/2014  . HX: breast cancer 06/29/2014  . Hypertension   . Vitamin D deficiency   . Spinal stenosis, lumbar region, with neurogenic  claudication 03/28/2013    Class: Chronic  . At high risk for falls  02/23/2013  . Systolic heart failure ([place]) 06/07/2010  . Congestive dilated cardiomyopathy ([place]) 05/29/2010  . Atrial fibrillation ([place]) 04/16/2009  . Elevated lipids 04/13/2009  . Osteoarthritis 04/13/2009    Nickey Kloepfer 07/08/2016, 12:48 PM  [facility] [address] [place], [place], [address] Phone: [phone]   Fax:  [phone]  Name: Pamela Whitaker MRN: [id number] Date of Birth: 1933/05/22  Raeford Razor, PT 07/08/16 12:49 PM Phone: [phone] Fax: [phone]

## 2016-07-10 ENCOUNTER — Ambulatory Visit: Payer: Medicare Other | Admitting: Physical Therapy

## 2016-07-10 DIAGNOSIS — R262 Difficulty in walking, not elsewhere classified: Secondary | ICD-10-CM

## 2016-07-10 DIAGNOSIS — R293 Abnormal posture: Secondary | ICD-10-CM

## 2016-07-10 DIAGNOSIS — M6281 Muscle weakness (generalized): Secondary | ICD-10-CM

## 2016-07-10 DIAGNOSIS — G8929 Other chronic pain: Secondary | ICD-10-CM

## 2016-07-10 DIAGNOSIS — R296 Repeated falls: Secondary | ICD-10-CM

## 2016-07-10 DIAGNOSIS — M25561 Pain in right knee: Secondary | ICD-10-CM

## 2016-07-10 NOTE — Therapy (Signed)
[place], [place], [address] Phone: [phone]   Fax:  [phone]  Physical Therapy Treatment  Patient Details  Name: Pamela Whitaker MRN: [id number] Date of Birth: 08-10-1932 Referring Provider: Dr. Gerrit Halls   Encounter Date: 07/10/2016      PT End of Session - 07/10/16 1242    Visit Number 13   Number of Visits 16   Date for PT Re-Evaluation 07/16/16   Authorization Type UHC MCR complete    PT Start Time 1150   PT Stop Time 1235   PT Time Calculation (min) 45 min   Activity Tolerance Patient tolerated treatment well   Behavior During Therapy [facility] for tasks assessed/performed      Past Medical History:  Diagnosis Date  . Atrial fibrillation ([place])    treated with multaq x 6 mos in 2011....[name]=2 (Age; + CHF; no CVA; no DM2). Echo 05/24/10: EF 30% mild AS(mean 77mmgHg); mild LAE; mod LAE; PASP 35 Systolic CHF  . Breast cancer ([place])     in 1987 with a left  mastectomy  . Cardiomyopathy    a. tachy mediated Myoview 06/06/10:  EF 31%; no scar or ischemia; EF up to 50% per echo November 2013  . Diverticulosis   . DJD (degenerative joint disease) of knee    right knee  . Falls   . Hypercholesteremia   . Hypertension   . Lumbar stenosis   . Osteoporosis    history of bone chips in the right knee,previous right fibular fracture, operated on by Dr. Veverly Fells  . RLS (restless legs syndrome)   . Vitamin D deficiency     Past Surgical History:  Procedure Laterality Date  . BREAST RECONSTRUCTION  1988   left breast  . CARDIOVERSION  2012  . KNEE ARTHROSCOPY  2005  . MASTECTOMY, RADICAL  1987   left breast  . WRIST FRACTURE SURGERY     left wrist    There were no vitals filed for this visit.      Subjective Assessment - 07/10/16 1157    Subjective No pain reported today.  Was feeling down before I came today.     Currently in Pain? No/denies             [facility] Adult PT  Treatment/Exercise - 07/10/16 1208      Knee/Hip Exercises: Aerobic   Nustep UE and LE 8 min L6 , 608 steps , pt mildly dyspneic      Knee/Hip Exercises: Standing   Heel Raises Both;1 set;20 reps   Hip Abduction Stengthening;Both;1 set;10 reps;Knee straight   Lateral Step Up Both;1 set;15 reps;Hand Hold: 2;Step Height: 6"   Forward Step Up Both;1 set;20 reps;Hand Hold: 2;Step Height: 6"     Knee/Hip Exercises: Supine   Quad Sets Strengthening;Both;1 set   Quad Sets Limitations and combo glute sets x 10, 5 sec hold    Hip Adduction Isometric Strengthening;Both;1 set;10 reps   Bridges with [employer] Strengthening;1 set;10 reps   Straight Leg Raises Strengthening;Both;1 set;10 reps   Straight Leg Raises Limitations leg lift up and out for lateral hip.  Unable to do without verbal cues                 PT Education - 07/10/16 1241    Education provided Yes   Education Details hip and LE alignment, form with step ups    Person(s) Educated Patient   Methods Explanation   Comprehension Verbalized understanding  PT Short Term Goals - 07/03/16 1311      PT SHORT TERM GOAL #1   Title "Independent with initial HEP   Status Achieved     PT SHORT TERM GOAL #2   Title Pt will complete Berg Balance Screen and understand results, modify home if needed.    Status Achieved     PT SHORT TERM GOAL #3   Title Pt will complete sit to stand with greater ease at normal chair heights.     Status Achieved     PT SHORT TERM GOAL #4   Title Pt will complete Assessment of Balance Confidence and set goal.    Baseline completed but unable to find results    Status Deferred     PT SHORT TERM GOAL #5   Title Pt will perform BERG and set long term goal for reduction of fall risk   Status Achieved           PT Long Term Goals - 07/03/16 1311      PT LONG TERM GOAL #1   Title Pt will improve balance confidence per patient report in home environment.    Status Unable to  assess     PT LONG TERM GOAL #2   Title Patient will report no pain in right knee with ambulation in church, up to 30 min.    Status On-going     PT LONG TERM GOAL #3   Title Berg Score will improve by 6 or more points to demo improved fall risk.    Status Achieved     PT LONG TERM GOAL #4   Title Pt will improve hip abduction to 4/5 or more to aid in gait stability.    Status Unable to assess     PT LONG TERM GOAL #5   Title Pt will be able to control knee hyperxtension with gait with min to no cueing.    Status On-going     PT LONG TERM GOAL #6   Status Deferred               Plan - 07/10/16 1243    Clinical Impression Statement Pt able to strengthen hips, get sufficient cardio.  She has difficulty activating lateral hip, glutes and quad with standing hip abd.  Pt will be OFF next week as she is going on vacation. It will be a good test to see if she can maintain mobility and the progress she has made.  Renew next visit.     PT Next Visit Plan continue wall slides facing wall,  hip strengthening.gait ask specific goals.    PT Home Exercise Plan level 1-2 knee and added clam in sidelying bridge and knee to chest    Consulted and Agree with Plan of Care Patient      Patient will benefit from skilled therapeutic intervention in order to improve the following deficits and impairments:  Abnormal gait, Decreased activity tolerance, Decreased balance, Decreased mobility, Difficulty walking, Impaired flexibility, Decreased endurance, Pain  Visit Diagnosis: Muscle weakness (generalized)  Difficulty in walking, not elsewhere classified  Repeated falls  Chronic pain of right knee  Abnormal posture     Problem List Patient Active Problem List   Diagnosis Date Noted  . Urinary incontinence 10/14/2015  . Encounter for Medicare annual wellness exam 12/06/2014  . HX: breast cancer 06/29/2014  . Hypertension   . Vitamin D deficiency   . Spinal stenosis, lumbar region,  with neurogenic claudication 03/28/2013  Class: Chronic  . At high risk for falls 02/23/2013  . Systolic heart failure ([place]) 06/07/2010  . Congestive dilated cardiomyopathy ([place]) 05/29/2010  . Atrial fibrillation ([place]) 04/16/2009  . Elevated lipids 04/13/2009  . Osteoarthritis 04/13/2009    Amed Datta 07/10/2016, 12:50 PM  [facility] [address] [place], [place], [address] Phone: [phone]   Fax:  [phone]  Name: Pamela Whitaker MRN: [id number] Date of Birth: 1933-05-19  Raeford Razor, PT 07/10/16 12:50 PM Phone: [phone] Fax: [phone]

## 2016-07-21 ENCOUNTER — Encounter: Payer: Self-pay | Admitting: Physical Therapy

## 2016-07-22 ENCOUNTER — Ambulatory Visit: Payer: Medicare Other | Admitting: Physical Therapy

## 2016-07-22 DIAGNOSIS — M25561 Pain in right knee: Secondary | ICD-10-CM

## 2016-07-22 DIAGNOSIS — G8929 Other chronic pain: Secondary | ICD-10-CM

## 2016-07-22 DIAGNOSIS — R296 Repeated falls: Secondary | ICD-10-CM

## 2016-07-22 DIAGNOSIS — M6281 Muscle weakness (generalized): Secondary | ICD-10-CM

## 2016-07-22 DIAGNOSIS — R293 Abnormal posture: Secondary | ICD-10-CM

## 2016-07-22 DIAGNOSIS — R262 Difficulty in walking, not elsewhere classified: Secondary | ICD-10-CM

## 2016-07-22 NOTE — Therapy (Signed)
[place], [place], [address] Phone: [phone]   Fax:  [phone]  Physical Therapy Treatment  Patient Details  Name: Pamela Whitaker MRN: [id number] Date of Birth: [birth year] Referring Provider: Dr. Gerrit Halls   Encounter Date: 07/22/2016      PT End of Session - 07/22/16 1108    Visit Number 14   Number of Visits 22   Date for PT Re-Evaluation 08/22/16   PT Start Time 1109   PT Stop Time 1200   PT Time Calculation (min) 51 min   Activity Tolerance Patient tolerated treatment well   Behavior During Therapy [facility] for tasks assessed/performed      Past Medical History:  Diagnosis Date  . Atrial fibrillation ([place])    treated with multaq x 6 mos in 2011....[name]=2 (Age; + CHF; no CVA; no DM2). Echo 05/24/10: EF 30% mild AS(mean 24mmgHg); mild LAE; mod LAE; PASP 35 Systolic CHF  . Breast cancer ([place])     in 1987 with a left  mastectomy  . Cardiomyopathy    a. tachy mediated Myoview 06/06/10:  EF 31%; no scar or ischemia; EF up to 50% per echo November 2013  . Diverticulosis   . DJD (degenerative joint disease) of knee    right knee  . Falls   . Hypercholesteremia   . Hypertension   . Lumbar stenosis   . Osteoporosis    history of bone chips in the right knee,previous right fibular fracture, operated on by Dr. Veverly Fells  . RLS (restless legs syndrome)   . Vitamin D deficiency     Past Surgical History:  Procedure Laterality Date  . BREAST RECONSTRUCTION  1988   left breast  . CARDIOVERSION  2012  . KNEE ARTHROSCOPY  2005  . MASTECTOMY, RADICAL  1987   left breast  . WRIST FRACTURE SURGERY     left wrist    There were no vitals filed for this visit.      Subjective Assessment - 07/22/16 1111    Subjective Returns from vacation.  States she hit her Rt. ankle on a coffee table.  Did not do her exercises on her vacation.     Currently in Pain? Yes   Pain Score 7    Pain Location  Ankle   Pain Orientation Right   Pain Descriptors / Indicators Aching   Pain Type Chronic pain   Pain Onset In the past 7 days   Pain Frequency Intermittent   Aggravating Factors  walking    Pain Relieving Factors sitting   Multiple Pain Sites Yes   Pain Score 4   Pain Location Knee   Pain Orientation Right   Pain Descriptors / Indicators Aching   Pain Type Chronic pain   Pain Onset More than a month ago   Pain Frequency Intermittent   Aggravating Factors  weight bearing    Pain Relieving Factors rest             [facility] PT Assessment - 07/22/16 1113      AROM   Right Knee Extension 8   Right Knee Flexion 126   Left Knee Extension 0   Left Knee Flexion 130     Strength   Right Hip Flexion 3-/5   Left Hip Flexion 4-/5   Right Knee Flexion 5/5   Right Knee Extension 4/5   Left Knee Flexion 4+/5   Left Knee Extension 4+/5     Palpation   Patella  mobility hypomobile in Rt. knee    Palpation comment tender Rt. knee              [facility] Adult PT Treatment/Exercise - 07/22/16 1126      Knee/Hip Exercises: Stretches   Active Hamstring Stretch Both;2 reps;30 seconds   Active Hamstring Stretch Limitations seated     Knee/Hip Exercises: Aerobic   Nustep UE and LE 8 min L6 , 608 steps , pt mildly dyspneic      Knee/Hip Exercises: Supine   Quad Sets Strengthening;Both;1 set   Bridges Strengthening;Both;1 set;10 reps   Straight Leg Raises Strengthening;Both;1 set;10 reps     Knee/Hip Exercises: Sidelying   Hip ABduction Strengthening;Both;1 set   Clams x 20      Cryotherapy   Number Minutes Cryotherapy 10 Minutes   Cryotherapy Location Knee   Type of Cryotherapy Ice pack                PT Education - 07/22/16 1134    Education provided Yes   Education Details reissue HEP, strengthening   Person(s) Educated Patient   Methods Explanation;Demonstration   Comprehension Verbalized understanding;Need further instruction          PT Short Term  Goals - 07/03/16 1311      PT SHORT TERM GOAL #1   Title "Independent with initial HEP   Status Achieved     PT SHORT TERM GOAL #2   Title Pt will complete Berg Balance Screen and understand results, modify home if needed.    Status Achieved     PT SHORT TERM GOAL #3   Title Pt will complete sit to stand with greater ease at normal chair heights.     Status Achieved     PT SHORT TERM GOAL #4   Title Pt will complete Assessment of Balance Confidence and set goal.    Baseline completed but unable to find results    Status Deferred     PT SHORT TERM GOAL #5   Title Pt will perform BERG and set long term goal for reduction of fall risk   Status Achieved           PT Long Term Goals - 07/22/16 1259      PT LONG TERM GOAL #1   Title Pt will improve balance confidence per patient report in home environment.    Baseline cont to work on    Status On-going     PT [place] #2   Title Patient will report no pain in right knee with ambulation in church, up to 30 min.    Status On-going     PT LONG TERM GOAL #3   Title Berg Score will improve by 6 or more points to demo improved fall risk.    Status Achieved     PT LONG TERM GOAL #4   Title Pt will improve hip abduction to 4/5 or more to aid in gait stability.    Status On-going     PT LONG TERM GOAL #5   Title Pt will be able to control knee hyperxtension with gait with min to no cueing.    Status On-going     PT LONG TERM GOAL #6   Title Pt will improve BERG to at least 40/56 from 20/56 to reduce risk of fall using assistive device (duplicate)    Status Deferred               Plan - 07/22/16 1256  Clinical Impression Statement Patient has missed a week due to vacation, she needed more cueing for HEP and seemed weaker in her ability to activate glutes. Renewed for 4 more weeks and explained skilled therapy vs need to cont exercising.    PT Next Visit Plan continue wall slides facing wall,  hip  strengthening.gait   PT Home Exercise Plan level 1-2 knee and added clam in sidelying bridge and knee to chest , reissued 08/13/16   Consulted and Agree with Plan of Care Patient      Patient will benefit from skilled therapeutic intervention in order to improve the following deficits and impairments:  Abnormal gait, Decreased activity tolerance, Decreased balance, Decreased mobility, Difficulty walking, Impaired flexibility, Decreased endurance, Pain  Visit Diagnosis: Muscle weakness (generalized)  Difficulty in walking, not elsewhere classified  Repeated falls  Chronic pain of right knee  Abnormal posture       G-Codes - 13-Aug-2016 1301    Functional Assessment Tool Used (Outpatient Only) clinical judgement    Functional Limitation Mobility: Walking and moving around   Mobility: Walking and Moving Around Current Status [id number]) At least 40 percent but less than 60 percent impaired, limited or restricted   Mobility: Walking and Moving Around Goal Status [phone]) At least 40 percent but less than 60 percent impaired, limited or restricted      Problem List Patient Active Problem List   Diagnosis Date Noted  . Urinary incontinence 10/14/2015  . Encounter for Medicare annual wellness exam 12/06/2014  . HX: breast cancer 06/29/2014  . Hypertension   . Vitamin D deficiency   . Spinal stenosis, lumbar region, with neurogenic claudication 03/28/2013    Class: Chronic  . At high risk for falls 02/23/2013  . Systolic heart failure ([place]) 06/07/2010  . Congestive dilated cardiomyopathy ([place]) 05/29/2010  . Atrial fibrillation ([place]) 04/16/2009  . Elevated lipids 04/13/2009  . Osteoarthritis 04/13/2009    Roseline Ebarb August 13, 2016, 1:04 PM  [facility] [address] [place], [place], [address] Phone: [phone]   Fax:  [phone]  Name: Pamela Whitaker MRN: [id number] Date of Birth: 02/03/1933  Raeford Razor, PT Aug 13, 2016 1:05  PM Phone: [phone] Fax: [phone]

## 2016-07-22 NOTE — Patient Instructions (Signed)
Abduction: Side Leg Lift (Eccentric) - Side-Lying    Lie on side. Lift top leg slightly higher than shoulder level. Keep top leg straight with body, toes pointing forward. Slowly lower for 3-5 seconds. 10__ reps per set, __1_ sets per day, __7_ days per week. http://ecce.exer.us/63   Copyright  VHI. All rights reserved.    Hip Flexion / Knee Extension: Straight-Leg Raise (Eccentric)    Lie on back. Lift leg with knee straight. Slowly lower leg for 3-5 seconds. __10_ reps per set, _1__ sets per day, 7_ days per week. Lower like elevator, stopping at each floor.   Copyright  VHI. All rights reserved.   HIP: Hamstrings - Short Sitting    Rest leg on raised surface. Keep knee straight. Lift chest. Hold __30_ seconds. _3__ reps per set, _1__ sets per day, __7_ days per week  Copyright  VHI. All rights reserved.

## 2016-07-23 ENCOUNTER — Encounter (HOSPITAL_COMMUNITY): Payer: Self-pay

## 2016-07-23 ENCOUNTER — Other Ambulatory Visit: Payer: Self-pay | Admitting: Internal Medicine

## 2016-07-23 ENCOUNTER — Encounter (HOSPITAL_COMMUNITY): Payer: Medicare Other

## 2016-07-23 ENCOUNTER — Encounter: Payer: Medicare Other | Admitting: Physical Therapy

## 2016-07-23 ENCOUNTER — Encounter (HOSPITAL_COMMUNITY): Payer: Medicare Other | Attending: Oncology | Admitting: Oncology

## 2016-07-23 VITALS — BP 149/85 | HR 79 | Temp 97.7°F | Resp 16 | Wt 153.9 lb

## 2016-07-23 DIAGNOSIS — Z808 Family history of malignant neoplasm of other organs or systems: Secondary | ICD-10-CM | POA: Insufficient documentation

## 2016-07-23 DIAGNOSIS — Z9012 Acquired absence of left breast and nipple: Secondary | ICD-10-CM | POA: Insufficient documentation

## 2016-07-23 DIAGNOSIS — M25561 Pain in right knee: Secondary | ICD-10-CM | POA: Insufficient documentation

## 2016-07-23 DIAGNOSIS — R140 Abdominal distension (gaseous): Secondary | ICD-10-CM | POA: Insufficient documentation

## 2016-07-23 DIAGNOSIS — R109 Unspecified abdominal pain: Secondary | ICD-10-CM | POA: Insufficient documentation

## 2016-07-23 DIAGNOSIS — G8929 Other chronic pain: Secondary | ICD-10-CM | POA: Insufficient documentation

## 2016-07-23 DIAGNOSIS — Z9889 Other specified postprocedural states: Secondary | ICD-10-CM | POA: Insufficient documentation

## 2016-07-23 DIAGNOSIS — I110 Hypertensive heart disease with heart failure: Secondary | ICD-10-CM | POA: Diagnosis not present

## 2016-07-23 DIAGNOSIS — Z833 Family history of diabetes mellitus: Secondary | ICD-10-CM | POA: Insufficient documentation

## 2016-07-23 DIAGNOSIS — Z9221 Personal history of antineoplastic chemotherapy: Secondary | ICD-10-CM | POA: Insufficient documentation

## 2016-07-23 DIAGNOSIS — E559 Vitamin D deficiency, unspecified: Secondary | ICD-10-CM

## 2016-07-23 DIAGNOSIS — Z5189 Encounter for other specified aftercare: Secondary | ICD-10-CM | POA: Insufficient documentation

## 2016-07-23 DIAGNOSIS — I5020 Unspecified systolic (congestive) heart failure: Secondary | ICD-10-CM | POA: Diagnosis not present

## 2016-07-23 DIAGNOSIS — Z853 Personal history of malignant neoplasm of breast: Secondary | ICD-10-CM | POA: Diagnosis not present

## 2016-07-23 LAB — COMPREHENSIVE METABOLIC PANEL
ALBUMIN: 3.8 g/dL (ref 3.5–5.0)
ALK PHOS: 59 U/L (ref 38–126)
ALT: 20 U/L (ref 14–54)
AST: 21 U/L (ref 15–41)
Anion gap: 6 (ref 5–15)
BILIRUBIN TOTAL: 0.7 mg/dL (ref 0.3–1.2)
BUN: 10 mg/dL (ref 6–20)
CALCIUM: 9.5 mg/dL (ref 8.9–10.3)
CO2: 28 mmol/L (ref 22–32)
Chloride: 105 mmol/L (ref 101–111)
Creatinine, Ser: 0.71 mg/dL (ref 0.44–1.00)
GFR calc Af Amer: >60 mL/min (ref >60)
GFR calc non Af Amer: >60 mL/min (ref >60)
GLUCOSE: 95 mg/dL (ref 65–99)
Potassium: 4.1 mmol/L (ref 3.5–5.1)
Sodium: 139 mmol/L (ref 135–145)
TOTAL PROTEIN: 7.2 g/dL (ref 6.5–8.1)

## 2016-07-23 LAB — CBC WITH DIFFERENTIAL/PLATELET
BASOS ABS: 0 10*3/uL (ref 0.0–0.1)
BASOS PCT: 1 %
Eosinophils Absolute: 0.2 10*3/uL (ref 0.0–0.7)
Eosinophils Relative: 3 %
HEMATOCRIT: 38 % (ref 36.0–46.0)
HEMOGLOBIN: 12.6 g/dL (ref 12.0–15.0)
Lymphocytes Relative: 23 %
Lymphs Abs: 1.8 10*3/uL (ref 0.7–4.0)
MCH: 30.8 pg (ref 26.0–34.0)
MCHC: 33.2 g/dL (ref 30.0–36.0)
MCV: 92.9 fL (ref 78.0–100.0)
Monocytes Absolute: 0.6 10*3/uL (ref 0.1–1.0)
Monocytes Relative: 8 %
NEUTROS ABS: 5.1 10*3/uL (ref 1.7–7.7)
NEUTROS PCT: 65 %
Platelets: 205 10*3/uL (ref 150–400)
RBC: 4.09 MIL/uL (ref 3.87–5.11)
RDW: 12.1 % (ref 11.5–15.5)
WBC: 7.8 10*3/uL (ref 4.0–10.5)

## 2016-07-23 NOTE — Patient Instructions (Signed)
[place] at [facility] Discharge Instructions  RECOMMENDATIONS MADE BY THE CONSULTANT AND ANY TEST RESULTS WILL BE SENT TO YOUR REFERRING PHYSICIAN.  You were seen today by Dr. Twana First We will schedule you for a screening mammogram Follow up in 1 year See Amy up front for appointments   Thank you for choosing [place] at [facility] to provide your oncology and hematology care.  To afford each patient quality time with our provider, please arrive at least 15 minutes before your scheduled appointment time.    If you have a lab appointment with the [facility] please come in thru the  Main Entrance and check in at the main information desk  You need to re-schedule your appointment should you arrive 10 or more minutes late.  We strive to give you quality time with our providers, and arriving late affects you and other patients whose appointments are after yours.  Also, if you no show three or more times for appointments you may be dismissed from the clinic at the providers discretion.     Again, thank you for choosing [facility]  Our hope is that these requests will decrease the amount of time that you wait before being seen by our physicians.       _____________________________________________________________  Should you have questions after your visit to [facility], please contact our office at [phone] between the hours of 8:30 a.m. and 4:30 p.m.  Voicemails left after 4:30 p.m. will not be returned until the following business day.  For prescription refill requests, have your pharmacy contact our office.       Resources For Cancer Patients and their Caregivers ? American Cancer Society: Can assist with transportation, wigs, general needs, runs Look Good Feel Better.        367-155-2504 ? Cancer Care: Provides financial assistance, online support groups, medication/co-pay assistance.   1-800-813-HOPE 631-402-5014) ? [place] Assists [place] cancer patients and their families through emotional , educational and financial support.  480-131-0402 ? [place] DSS Where to apply for food stamps, Medicaid and utility assistance. 819-546-6580 ? [facility]: Transportation to medical appointments. (438) 529-6918 ? Social Security Administration: May apply for disability if have a Stage IV cancer. 347-607-1024 (949)638-7981 ? [employer], Disability and Transit Services: Assists with nutrition, care and transit needs. [place] Support Programs: @10RELATIVEDAYS @ > Cancer Support Group  2nd Tuesday of the month 1pm-2pm, Journey Room  > Creative Journey  3rd Tuesday of the month 1130am-1pm, Journey Room  > Look Good Feel Better  1st Wednesday of the month 10am-12 noon, Journey Room (Call [place] to register [phone])

## 2016-07-23 NOTE — Progress Notes (Signed)
[facility] [place], [place] [address]   DIAGNOSIS:  Stage III adenocarcinoma of the breast  April 10, 1986 consisting of a left mastectomy with reconstruction by Dr. Donnie Coffin  6.5 cm cancer with 2 of 27 positive lymph nodes status post CAF VP chemotherapy.   She took chemotherapy for a year followed by hormonal therapy for 12 months but was stopped due to intolerance. At that time, tried Megace and tamoxifen and she opted not to try anything further.  CURRENT THERAPY: Observation  INTERVAL HISTORY: Pamela Whitaker 83 y.o. female returns for follow-up of her breast cancer.   She has been doing well. Her last mammogram was 06/2014. She gets very tired after basic activities such as walking to the mailbox and back. She also states she can now feel when she is in a. Fib.  Last week she had some abdominal pain and bloating, but that went away later that afternoon. It hasn't happened since. Denies new masses on her breast, weight loss, chest pain, SOB, or any other concerns.   MEDICAL HISTORY: Past Medical History:  Diagnosis Date  . Atrial fibrillation ([place])    treated with multaq x 6 mos in 2011....[name]=2 (Age; + CHF; no CVA; no DM2). Echo 05/24/10: EF 30% mild AS(mean 47mmgHg); mild LAE; mod LAE; PASP 35 Systolic CHF  . Breast cancer ([place])     in 1987 with a left  mastectomy  . Cardiomyopathy    a. tachy mediated Myoview 06/06/10:  EF 31%; no scar or ischemia; EF up to 50% per echo November 2013  . Diverticulosis   . DJD (degenerative joint disease) of knee    right knee  . Falls   . Hypercholesteremia   . Hypertension   . Lumbar stenosis   . Osteoporosis    history of bone chips in the right knee,previous right fibular fracture, operated on by Dr. Veverly Fells  . RLS (restless legs syndrome)   . Vitamin D deficiency     has Elevated lipids; Atrial fibrillation ([place]);  Osteoarthritis; Congestive dilated cardiomyopathy ([place]); Systolic heart failure ([place]); At high risk for falls; Spinal stenosis, lumbar region, with neurogenic claudication; Hypertension; Vitamin D deficiency; HX: breast cancer; Encounter for Medicare annual wellness exam; and Urinary incontinence on her problem list.     is allergic to amiodarone; lipitor [atorvastatin]; and multaq [dronedarone].  Ms. Stokoe does not currently have medications on file.  SURGICAL HISTORY: Past Surgical History:  Procedure Laterality Date  . BREAST RECONSTRUCTION  1988   left breast  . CARDIOVERSION  2012  . KNEE ARTHROSCOPY  2005  . MASTECTOMY, RADICAL  1987   left breast  . WRIST FRACTURE SURGERY     left wrist    SOCIAL HISTORY: Social History   Social History  . Marital status: Married    Spouse name: Chrissie Noa  . Number of children: 2  . Years of education: college   Occupational History  . retired Retired   Social History Main Topics  . Smoking status: Never Smoker  . Smokeless tobacco: Never Used  . Alcohol use No  . Drug use: No  . Sexual activity: No   Other Topics Concern  . Not on file   Social History Narrative  . No narrative on file    FAMILY HISTORY: Family History  Problem Relation Age of Onset  . Heart attack Father   . Aortic  aneurysm Mother   . Breast cancer Sister   . Diabetes Sister   . Stomach cancer Sister     Review of Systems  Constitutional: Positive for malaise/fatigue. Negative for weight loss.       No breast masses  HENT: Negative.   Eyes: Negative.   Respiratory: Negative.  Negative for shortness of breath.   Cardiovascular: Negative.  Negative for chest pain.  Gastrointestinal: Positive for abdominal pain.       Abdominal bloating  Genitourinary: Negative.   Musculoskeletal: Negative.   Skin: Negative.   Neurological: Negative.   Endo/Heme/Allergies: Negative.   Psychiatric/Behavioral: Negative.   All other systems reviewed and are  negative. 14 point review of systems was performed and is negative except as detailed under history of present illness and above  PHYSICAL EXAMINATION ECOG PERFORMANCE STATUS: 0 - Asymptomatic  Vitals:   07/23/16 1451  BP: (!) 149/85  Pulse: 79  Resp: 16  Temp: 97.7 F (36.5 C)   Physical Exam  Constitutional: She is oriented to person, place, and time and well-developed, well-nourished, and in no distress.  HENT:  Head: Normocephalic and atraumatic.  Eyes: EOM are normal. Pupils are equal, round, and reactive to light.  Neck: Normal range of motion. Neck supple.  Cardiovascular: Normal heart sounds.   HR irregularly irregular  Pulmonary/Chest: Effort normal and breath sounds normal.    Abdominal: Soft. Bowel sounds are normal.  Musculoskeletal: Normal range of motion.  Neurological: She is alert and oriented to person, place, and time. Gait normal.  Skin: Skin is warm and dry.  Nursing note and vitals reviewed.  LABORATORY DATA: I have reviewed the data as listed. CBC    Component Value Date/Time   WBC 7.8 07/23/2016 1357   RBC 4.09 07/23/2016 1357   HGB 12.6 07/23/2016 1357   HCT 38.0 07/23/2016 1357   PLT 205 07/23/2016 1357   MCV 92.9 07/23/2016 1357   MCH 30.8 07/23/2016 1357   MCHC 33.2 07/23/2016 1357   RDW 12.1 07/23/2016 1357   LYMPHSABS 1.8 07/23/2016 1357   MONOABS 0.6 07/23/2016 1357   EOSABS 0.2 07/23/2016 1357   BASOSABS 0.0 07/23/2016 1357   CMP     Component Value Date/Time   NA 139 07/23/2016 1357   K 4.1 07/23/2016 1357   CL 105 07/23/2016 1357   CO2 28 07/23/2016 1357   GLUCOSE 95 07/23/2016 1357   BUN 10 07/23/2016 1357   CREATININE 0.71 07/23/2016 1357   CREATININE 0.76 05/01/2016 1100   CALCIUM 9.5 07/23/2016 1357   PROT 7.2 07/23/2016 1357   ALBUMIN 3.8 07/23/2016 1357   AST 21 07/23/2016 1357   ALT 20 07/23/2016 1357   ALKPHOS 59 07/23/2016 1357   BILITOT 0.7 07/23/2016 1357   GFRNONAA >60 07/23/2016 1357   GFRNONAA 83  03/20/2015 1351   GFRAA >60 07/23/2016 1357   GFRAA >89 03/20/2015 1351   RADIOLOGY: I have reviewed the images below and agree with the reported results  DEXA scan 01/09/2016 AP Spine L1-L4 01/09/2016 81.3 Osteopenia -1.6 0.987 g/cm2 -5.7% Yes AP Spine L1-L4 03/02/2014 79.5 Osteopenia -1.1 1.047 g/cm2 6.6% Yes AP Spine L1-L4 08/04/2011 76.9 Osteopenia -1.6 0.982 g/cm2 1.3% - AP Spine L1-L4 06/27/2009 74.8 Osteopenia -1.8 0.969 g/cm2 -2.2% - AP Spine L1-L4 06/21/2007 72.8 Osteopenia -1.6 0.991 g/cm2 0.0% - AP Spine L1-L4 06/09/2005 70.8 Osteopenia -1.6 0.991 g/cm2 9.7% Yes AP Spine L1-L4 06/06/2003 68.7 Osteopenia -2.3 0.903 g/cm2 - -  DualFemur Total Right 01/09/2016 81.3 Osteoporosis -  2.6 0.676 g/cm2 -5.2% Yes DualFemur Total Right 03/02/2014 79.5 Osteopenia -2.3 0.713 g/cm2 -1.7% - DualFemur Total Right 08/04/2011 76.9 Osteopenia -2.2 0.725 g/cm2 0.0% - DualFemur Total Right 06/27/2009 74.8 Osteopenia -2.2 0.725 g/cm2 -2.3% - DualFemur Total Right 06/21/2007 72.8 Osteopenia -2.1 0.742 g/cm2 1.8% - DualFemur Total Right 06/09/2005 70.8 Osteopenia -2.2 0.729 g/cm2 6.7% Yes DualFemur Total Right 06/06/2003 68.7 Osteoporosis -2.6 0.683 g/cm2  ASSESSMENT and THERAPY PLAN:  Stage III adenocarcinoma of the breast  April 10, 1986 consisting of a left mastectomy with reconstruction by Dr. Donnie Coffin  6.5 cm cancer with 2 of 27 positive lymph nodes status post CAF VP chemotherapy.  Chronic R knee pain  Clinically NED on exam today.  Ordered screening mammogram of right breast.  She will return for follow up in 1 year.  No orders of the defined types were placed in this encounter.  All questions were answered. The patient knows to call the clinic with any problems, questions or concerns. We can certainly see the patient much sooner if necessary.  This document serves as a record of services personally performed by Twana First, MD. It was created on her behalf by Martinique  Casey, a trained medical scribe. The creation of this record is based on the scribe's personal observations and the provider's statements to them. This document has been checked and approved by the attending provider.  I have reviewed the above documentation for accuracy and completeness, and I agree with the above.  [place] Sanda Klein  07/23/2016

## 2016-07-24 LAB — CANCER ANTIGEN 27.29: CA 27.29: 18.6 U/mL (ref 0.0–38.6)

## 2016-07-29 ENCOUNTER — Ambulatory Visit: Payer: Medicare Other | Attending: Physician Assistant | Admitting: Physical Therapy

## 2016-07-29 DIAGNOSIS — M25561 Pain in right knee: Secondary | ICD-10-CM | POA: Insufficient documentation

## 2016-07-29 DIAGNOSIS — R262 Difficulty in walking, not elsewhere classified: Secondary | ICD-10-CM

## 2016-07-29 DIAGNOSIS — M6281 Muscle weakness (generalized): Secondary | ICD-10-CM | POA: Diagnosis present

## 2016-07-29 DIAGNOSIS — G8929 Other chronic pain: Secondary | ICD-10-CM | POA: Diagnosis present

## 2016-07-29 DIAGNOSIS — R293 Abnormal posture: Secondary | ICD-10-CM | POA: Diagnosis present

## 2016-07-29 DIAGNOSIS — R296 Repeated falls: Secondary | ICD-10-CM

## 2016-07-29 NOTE — Therapy (Signed)
[facility] [place], [place], [address] Phone: [phone]   Fax:  [phone]  Physical Therapy Treatment  Patient Details  Name: Pamela Whitaker MRN: [id number] Date of Birth: 09/06/1932 Referring Provider: Dr. Gerrit Halls   Encounter Date: 07/29/2016      PT End of Session - 07/29/16 1230    Visit Number 15   Number of Visits 22   PT Start Time 6962   PT Stop Time 1233   PT Time Calculation (min) 45 min   Activity Tolerance Patient tolerated treatment well   Behavior During Therapy [facility] for tasks assessed/performed      Past Medical History:  Diagnosis Date  . Atrial fibrillation ([place])    treated with multaq x 6 mos in 2011....[name]=2 (Age; + CHF; no CVA; no DM2). Echo 05/24/10: EF 30% mild AS(mean 66mgHg); mild LAE; mod LAE; PASP 35 Systolic CHF  . [place]     in [place] with a left  mastectomy  . Cardiomyopathy    a. tachy mediated Myoview 06/06/10:  EF 31%; no scar or ischemia; EF up to 50% per echo November 2013  . Diverticulosis   . DJD (degenerative joint disease) of knee    right knee  . Falls   . Hypercholesteremia   . Hypertension   . Lumbar stenosis   . Osteoporosis    history of bone chips in the right knee,previous right fibular fracture, operated on by Dr. NVeverly Fells . RLS (restless legs syndrome)   . Vitamin D deficiency     Past Surgical History:  Procedure Laterality Date  . BREAST RECONSTRUCTION  1988   left breast  . CARDIOVERSION  2012  . KNEE ARTHROSCOPY  2005  . MASTECTOMY, RADICAL  1987   left breast  . WRIST FRACTURE SURGERY     left wrist    There were no vitals filed for this visit.      Subjective Assessment - 07/29/16 1153    Subjective No different pain.  Bought a brace for Rt. ankle.  She feels like her ankle is turning in.  But not in a new way. She has been doing some of my exercises in bed.  Been to water classes.                  [place] [facility] Treatment/Exercise - 07/29/16 1217      High Level Balance   High Level Balance Comments tandem stance with 1 UE assist      Self-Care   Other Self-Care Comments  spent time adjusting her ankle brace and teaching her how to apply it,      Knee/Hip Exercises: Standing   Hip Abduction Stengthening;Both;1 set;10 reps;Knee straight   Hip Extension Stengthening;Both;1 set;10 reps   Other Standing Knee Exercises weight shifting with hands sliding up wall 5 X each CGA/Min assist for safety.  x 10 each side R/L foot forward    Other Standing Knee Exercises march slow for hip flex and pelvic stability x 10 each HHA                 PT Education - 07/29/16 1246    Education provided Yes   Education Details standing, ankle brace    Person(s) Educated Patient   Methods Explanation   Comprehension Verbalized understanding          PT Short Term Goals - 07/03/16 1311      PT SHORT TERM  GOAL #1   Title "Independent with initial HEP   Status Achieved     PT SHORT TERM GOAL #2   Title Pt will complete Berg Balance Screen and understand results, modify home if needed.    Status Achieved     PT SHORT TERM GOAL #3   Title Pt will complete sit to stand with greater ease at normal chair heights.     Status Achieved     PT SHORT TERM GOAL #4   Title Pt will complete Assessment of Balance Confidence and set goal.    Baseline completed but unable to find results    Status Deferred     PT SHORT TERM GOAL #5   Title Pt will perform BERG and set long term goal for reduction of fall risk   Status Achieved           PT Long Term Goals - 07/29/16 1248      PT LONG TERM GOAL #1   Title Pt will improve balance confidence per patient report in home environment.    Status On-going     PT LONG TERM GOAL #2   Title Patient will report no pain in right knee with ambulation in church, up to 30 min.    Status On-going     PT LONG TERM GOAL #3   Title Berg Score will improve  by 6 or more points to demo improved fall risk.    Status Achieved     PT LONG TERM GOAL #4   Title Pt will improve hip abduction to 4/5 or more to aid in gait stability.    Status On-going     PT LONG TERM GOAL #5   Title Pt will be able to control knee hyperxtension with gait with min to no cueing.    Status Partially Met     PT LONG TERM GOAL #6   Title Pt will improve BERG to at least 40/56 from 20/56 to reduce risk of fall using assistive device (duplicate)    Status Deferred               Plan - 07/29/16 1247    Clinical Impression Statement Pt plans to wear ankle brace for alignment while walking in the community.  It can be painful when she walks too much. Worked on glute activation in standing and some light balance activities.  Needs Min HHA for gait without AD.     PT Next Visit Plan continue wall slides facing wall,  hip strengthening.gait   PT Home Exercise Plan level 1-2 knee and added clam in sidelying bridge and knee to chest , reissued 07/22/16   Consulted and Agree with Plan of Care Patient      Patient will benefit from skilled therapeutic intervention in order to improve the following deficits and impairments:  Abnormal gait, Decreased activity tolerance, Decreased balance, Decreased mobility, Difficulty walking, Impaired flexibility, Decreased endurance, Pain  Visit Diagnosis: Muscle weakness (generalized)  Difficulty in walking, not elsewhere classified  Repeated falls  Chronic pain of right knee  Abnormal posture     Problem List Patient Active Problem List   Diagnosis Date Noted  . Urinary incontinence 10/14/2015  . Encounter for Medicare annual wellness exam 12/06/2014  . HX: breast cancer 06/29/2014  . Hypertension   . Vitamin D deficiency   . Spinal stenosis, lumbar region, with neurogenic claudication 03/28/2013    Class: Chronic  . At high risk for falls 02/23/2013  . Systolic  heart failure ([place]) 06/07/2010  . Congestive dilated  cardiomyopathy ([place]) 05/29/2010  . Atrial fibrillation ([place]) 04/16/2009  . Elevated lipids 04/13/2009  . Osteoarthritis 04/13/2009    Lateasha Breuer 07/29/2016, 12:50 PM  [facility] [address] [place], [place], [address] Phone: [phone]   Fax:  [phone]  Name: Pamela Whitaker MRN: [id number] Date of Birth: [birth year]  Raeford Razor, PT 07/29/16 12:50 PM Phone: [phone] Fax: [phone]

## 2016-08-04 ENCOUNTER — Ambulatory Visit (HOSPITAL_COMMUNITY): Payer: Medicare Other

## 2016-08-05 ENCOUNTER — Ambulatory Visit: Payer: Medicare Other | Admitting: Physical Therapy

## 2016-08-07 ENCOUNTER — Ambulatory Visit: Payer: Medicare Other | Admitting: Physical Therapy

## 2016-08-07 DIAGNOSIS — M6281 Muscle weakness (generalized): Secondary | ICD-10-CM

## 2016-08-07 DIAGNOSIS — M25561 Pain in right knee: Secondary | ICD-10-CM

## 2016-08-07 DIAGNOSIS — R296 Repeated falls: Secondary | ICD-10-CM

## 2016-08-07 DIAGNOSIS — R262 Difficulty in walking, not elsewhere classified: Secondary | ICD-10-CM

## 2016-08-07 DIAGNOSIS — G8929 Other chronic pain: Secondary | ICD-10-CM

## 2016-08-07 DIAGNOSIS — R293 Abnormal posture: Secondary | ICD-10-CM

## 2016-08-07 NOTE — Therapy (Signed)
[place], [place], [address] Phone: [phone]   Fax:  [phone]  Physical Therapy Treatment  Patient Details  Name: Pamela Whitaker MRN: [id number] Date of Birth: 1932/09/18 Referring Provider: Dr. Gerrit Halls   Encounter Date: 08/07/2016      PT End of Session - 08/07/16 1048    Visit Number 16   Number of Visits 22   Date for PT Re-Evaluation 08/22/16   Authorization Type UHC MCR complete    Authorization Time Period No visit limit    PT Start Time 1015   PT Stop Time 1115   PT Time Calculation (min) 60 min   Activity Tolerance Patient tolerated treatment well   Behavior During Therapy [facility] for tasks assessed/performed      Past Medical History:  Diagnosis Date  . Atrial fibrillation ([place])    treated with multaq x 6 mos in 2011....[name]=2 (Age; + CHF; no CVA; no DM2). Echo 05/24/10: EF 30% mild AS(mean 15mgHg); mild LAE; mod LAE; PASP 35 Systolic CHF  . Breast cancer ([place]     in 1987 with a left  mastectomy  . Cardiomyopathy    a. tachy mediated Myoview 06/06/10:  EF 31%; no scar or ischemia; EF up to 50% per echo November 2013  . Diverticulosis   . DJD (degenerative joint disease) of knee    right knee  . Falls   . Hypercholesteremia   . Hypertension   . Lumbar stenosis   . Osteoporosis    history of bone chips in the right knee,previous right fibular fracture, operated on by Dr. NVeverly Fells . RLS (restless legs syndrome)   . Vitamin D deficiency     Past Surgical History:  Procedure Laterality Date  . BREAST RECONSTRUCTION  1988   left breast  . CARDIOVERSION  2012  . KNEE ARTHROSCOPY  2005  . MASTECTOMY, RADICAL  1987   left breast  . WRIST FRACTURE SURGERY     left wrist    There were no vitals filed for this visit.      Subjective Assessment - 08/07/16 1027    Subjective No pain today.  Pt feels more confident and safer in her home.  Wearing knee brace.  Ankle  brace is too much trouble.  Stands for up to 20 min at a time.     Currently in Pain? No/denies               [facility] 08[facility] 1033      Lumbar Exercises: Supine   Clam 15 reps   Clam Limitations blue band    Bent Knee Raise 15 reps   Bent Knee Raise Limitations blue band    Bridge 10 reps   Bridge Limitations blue band    Straight Leg Raise 10 reps     Knee/Hip Exercises: Standing   Heel Raises Both;1 set;20 reps   Heel Raises Limitations 3 lbs , HHA    Hip Flexion Stengthening;Both;1 set;10 reps;Knee bent   Hip Flexion Limitations high march , HHA      Knee/Hip Exercises: Seated   Long Arc Quad Strengthening;Both;1 set;15 reps;Weights   Long Arc Quad Weight 3 lbs.   Long Arc Quad Limitations x 15    Ball Squeeze x 20 hold with LAQ    Clamshell with TheraBand Blue  x 20    Knee/Hip Flexion march x 20 , 4 lbs      Knee/Hip Exercises:  Sidelying   Hip ABduction Strengthening;Both;1 set;10 reps   Clams x 20      Moist Heat Therapy   Number Minutes Moist Heat 10 Minutes   Moist Heat Location Lumbar Spine     Cryotherapy   Number Minutes Cryotherapy 10 Minutes   Cryotherapy Location Knee   Type of Cryotherapy Ice pack                PT Education - 08/07/16 1050    Education provided Yes   Education Details strengthening    Person(s) Educated Patient   Methods Explanation   Comprehension Verbalized understanding          PT Short Term Goals - 08/07/16 1025      PT SHORT TERM GOAL #1   Title "Independent with initial HEP   Status Achieved     PT SHORT TERM GOAL #2   Title Pt will complete Berg Balance Screen and understand results, modify home if needed.    Status Achieved     PT SHORT TERM GOAL #3   Title Pt will complete sit to stand with greater ease at normal chair heights.     Status Achieved     PT SHORT TERM GOAL #4   Title Pt will complete Assessment of Balance Confidence and set goal.    Status  Deferred     PT SHORT TERM GOAL #5   Title Pt will perform BERG and set long term goal for reduction of fall risk   Status Achieved           PT Long Term Goals - 08/07/16 1021      PT LONG TERM GOAL #1   Title Pt will improve balance confidence per patient report in home environment.    Status Achieved     PT LONG TERM GOAL #2   Title Patient will report no pain in right knee with ambulation in church, up to 30 min.    Baseline 20 min and then she has knee pain and some fatigue    Status On-going     PT LONG TERM GOAL #3   Title Berg Score will improve by 6 or more points to demo improved fall risk.    Status Achieved     PT LONG TERM GOAL #4   Title Pt will improve hip abduction to 4/5 or more to aid in gait stability.    Baseline 3-/5 to 3/5   Status On-going     PT LONG TERM GOAL #5   Title Pt will be able to control knee hyperxtension with gait with min to no cueing.    Baseline more conscious of the way she walks   Status Partially Met     PT LONG TERM GOAL #6   Title Pt will improve BERG to at least 40/56 from 20/56 to reduce risk of fall using assistive device (duplicate)    Status Deferred               Plan - 08/07/16 1050    Clinical Impression Statement Worked in all positions for guided LE strengthening for core and hips. Hip abduction 3-/5 bilaterally. Used ice for mild knee pain.    PT Next Visit Plan continue wall slides facing wall,  hip strengthening.gait, hip abd    PT Home Exercise Plan level 1-2 knee and added clam in sidelying bridge and knee to chest , reissued 07/22/16   Consulted and Agree with Plan of Care Patient  Patient will benefit from skilled therapeutic intervention in order to improve the following deficits and impairments:  Abnormal gait, Decreased activity tolerance, Decreased balance, Decreased mobility, Difficulty walking, Impaired flexibility, Decreased endurance, Pain  Visit Diagnosis: Muscle weakness  (generalized)  Difficulty in walking, not elsewhere classified  Repeated falls  Abnormal posture  Chronic pain of right knee     Problem List Patient Active Problem List   Diagnosis Date Noted  . Urinary incontinence 10/14/2015  . Encounter for Medicare annual wellness exam 12/06/2014  . HX: breast cancer 06/29/2014  . Hypertension   . Vitamin D deficiency   . Spinal stenosis, lumbar region, with neurogenic claudication 03/28/2013    Class: Chronic  . At high risk for falls 02/23/2013  . Systolic heart failure ([place]) 06/07/2010  . Congestive dilated cardiomyopathy ([place]) 05/29/2010  . Atrial fibrillation ([place]) 04/16/2009  . Elevated lipids 04/13/2009  . Osteoarthritis 04/13/2009    , 08/07/2016, 1:00 PM  [facility] [address] [place], [place], [address] Phone: [phone]   Fax:  [phone]  Name: Pamela Whitaker MRN: [id number] Date of Birth: 10-05-1932  Raeford Razor, PT 08/07/16 1:00 PM Phone: [phone] Fax: [phone]

## 2016-08-12 ENCOUNTER — Ambulatory Visit: Payer: Medicare Other | Admitting: Physical Therapy

## 2016-08-12 DIAGNOSIS — M25561 Pain in right knee: Secondary | ICD-10-CM

## 2016-08-12 DIAGNOSIS — R262 Difficulty in walking, not elsewhere classified: Secondary | ICD-10-CM

## 2016-08-12 DIAGNOSIS — M6281 Muscle weakness (generalized): Secondary | ICD-10-CM

## 2016-08-12 DIAGNOSIS — R296 Repeated falls: Secondary | ICD-10-CM

## 2016-08-12 DIAGNOSIS — G8929 Other chronic pain: Secondary | ICD-10-CM

## 2016-08-12 DIAGNOSIS — R293 Abnormal posture: Secondary | ICD-10-CM

## 2016-08-12 NOTE — Therapy (Signed)
[name] [address] [place], [place], [address] Phone: [phone]   Fax:  [phone]  Physical Therapy Treatment  Patient Details  Name: Pamela Whitaker MRN: [id number] Date of Birth: 11-Dec-1932 Referring Provider: Dr. Jodene Nam   Encounter Date: 08/12/2016      PT End of Session - 08/12/16 1029    Visit Number 17   Number of Visits 22   Date for PT Re-Evaluation 08/22/16   Authorization Type UHC MCR complete    PT Start Time 1023   PT Stop Time 1112   PT Time Calculation (min) 49 min   Activity Tolerance Patient tolerated treatment well   Behavior During Therapy [facility] for tasks assessed/performed      Past Medical History:  Diagnosis Date  . Atrial fibrillation (HCC)    treated with multaq x 6 mos in 2011....[name]=2 (Age; + CHF; no CVA; no DM2). Echo 05/24/10: EF 30% mild AS(mean ); mild LAE; mod LAE; PASP 35 Systolic CHF  . Breast cancer (HCC)     in 1987 with a left  mastectomy  . Cardiomyopathy    a. tachy mediated Myoview 06/06/10:  EF 31%; no scar or ischemia; EF up to 50% per echo November 2013  . Diverticulosis   . DJD (degenerative joint disease) of knee    right knee  . Falls   . Hypercholesteremia   . Hypertension   . Lumbar stenosis   . Osteoporosis    history of bone chips in the right knee,previous right fibular fracture, operated on by Dr. Ranell Patrick  . RLS (restless legs syndrome)   . Vitamin D deficiency     Past Surgical History:  Procedure Laterality Date  . BREAST RECONSTRUCTION  1988   left breast  . CARDIOVERSION  2012  . KNEE ARTHROSCOPY  2005  . MASTECTOMY, RADICAL  1987   left breast  . WRIST FRACTURE SURGERY     left wrist    There were no vitals filed for this visit.      Subjective Assessment - 08/12/16 1026    Subjective Pt a bit late, rushing along.  A little throbbing in my knee.  Did water class yesterday, it was so nice. Wants to work on balance     Currently in Pain? Yes   Pain Score 2    Pain Location Knee   Pain Orientation Right   Pain Descriptors / Indicators Throbbing   Pain Type Chronic pain   Pain Onset More than a month ago   Pain Frequency Intermittent   Aggravating Factors  walking , cold weather    Pain Relieving Factors rest, sitting                          [facility] Adult PT Treatment/Exercise - 08/12/16 1036      High Level Balance   High Level Balance Activities Side stepping;Backward walking;Marching backwards   High Level Balance Comments SLR hip abd, used mirror for feedback      Knee/Hip Exercises: Aerobic   Nustep LE only 5 min for LE strength level 5      Knee/Hip Exercises: Standing   Heel Raises Both;1 set;20 reps   Heel Raises Limitations parallel bars encouraged lateral hip activation    Lateral Step Up Both;1 set;10 reps;20 reps;Hand Hold: 2;Step Height: 6"   Lateral Step Up Limitations Rt. leg x 10 changed height to 4 inches focus on upright posture and optimal  alignment      Knee/Hip Exercises: Seated   Clamshell with TheraBand Blue   Knee/Hip Flexion sit to stand with red band to help lateral hip strength      Moist Heat Therapy   Number Minutes Moist Heat 10 Minutes   Moist Heat Location Lumbar Spine     Cryotherapy   Number Minutes Cryotherapy 8 Minutes   Cryotherapy Location Knee   Type of Cryotherapy Ice pack                PT Education - 08/12/16 1345    Education provided Yes   Education Details importance of Hip abd strength in knee alignment    Person(s) Educated Patient   Methods Explanation;Demonstration   Comprehension Verbalized understanding;Returned demonstration          PT Short Term Goals - 08/07/16 1025      PT SHORT TERM GOAL #1   Title "Independent with initial HEP   Status Achieved     PT SHORT TERM GOAL #2   Title Pt will complete Berg Balance Screen and understand results, modify home if needed.    Status Achieved     PT  SHORT TERM GOAL #3   Title Pt will complete sit to stand with greater ease at normal chair heights.     Status Achieved     PT SHORT TERM GOAL #4   Title Pt will complete Assessment of Balance Confidence and set goal.    Status Deferred     PT SHORT TERM GOAL #5   Title Pt will perform BERG and set long term goal for reduction of fall risk   Status Achieved           PT Long Term Goals - 08/12/16 1346      PT LONG TERM GOAL #1   Title Pt will improve balance confidence per patient report in home environment.    Baseline will cont to work   Status Achieved     PT LONG TERM GOAL #2   Title Patient will report no pain in right knee with ambulation in church, up to 30 min.    Status On-going     PT LONG TERM GOAL #3   Title Berg Score will improve by 6 or more points to demo improved fall risk.    Status Achieved     PT LONG TERM GOAL #4   Title Pt will improve hip abduction to 4/5 or more to aid in gait stability.    Status On-going     PT LONG TERM GOAL #5   Title Pt will be able to control knee hyperxtension with gait with min to no cueing.    Baseline needs cues >50% of the time    Status Partially Met               Plan - 08/12/16 1108    Clinical Impression Statement Used mirror effectively for balance and gait, strengthening activites.  Only 1 rest break.  Able to do a small step up with Rt. LE.  Poor hip strength and knee control.     PT Next Visit Plan continue wall slides facing wall,  hip strengthening.gait, hip abd    PT Home Exercise Plan level 1-2 knee and added clam in sidelying bridge and knee to chest , reissued 07/22/16   Consulted and Agree with Plan of Care Patient      Patient will benefit from skilled therapeutic intervention in order to  improve the following deficits and impairments:  Abnormal gait, Decreased activity tolerance, Decreased balance, Decreased mobility, Difficulty walking, Impaired flexibility, Decreased endurance,  Pain  Visit Diagnosis: Muscle weakness (generalized)  Difficulty in walking, not elsewhere classified  Repeated falls  Abnormal posture  Chronic pain of right knee     Problem List Patient Active Problem List   Diagnosis Date Noted  . Urinary incontinence 10/14/2015  . Encounter for Medicare annual wellness exam 12/06/2014  . HX: breast cancer 06/29/2014  . Hypertension   . Vitamin D deficiency   . Spinal stenosis, lumbar region, with neurogenic claudication 03/28/2013    Class: Chronic  . At high risk for falls 02/23/2013  . Systolic heart failure ([place]) 06/07/2010  . Congestive dilated cardiomyopathy ([place]) 05/29/2010  . Atrial fibrillation ([place]) 04/16/2009  . Elevated lipids 04/13/2009  . Osteoarthritis 04/13/2009    PAA,JENNIFER 08/12/2016, 1:48 PM  [facility] [address] [place], [place], [address] Phone: [phone]   Fax:  [phone]  Name: SAKINAH ROSAMOND MRN: [id number] Date of Birth: 06-13-1932  Raeford Razor, PT 08/12/16 1:48 PM Phone: [phone] Fax: [phone]

## 2016-08-14 ENCOUNTER — Ambulatory Visit: Payer: Medicare Other | Admitting: Physical Therapy

## 2016-08-14 DIAGNOSIS — G8929 Other chronic pain: Secondary | ICD-10-CM

## 2016-08-14 DIAGNOSIS — M25561 Pain in right knee: Secondary | ICD-10-CM

## 2016-08-14 DIAGNOSIS — M6281 Muscle weakness (generalized): Secondary | ICD-10-CM | POA: Diagnosis not present

## 2016-08-14 DIAGNOSIS — R262 Difficulty in walking, not elsewhere classified: Secondary | ICD-10-CM

## 2016-08-14 DIAGNOSIS — R296 Repeated falls: Secondary | ICD-10-CM

## 2016-08-14 DIAGNOSIS — R293 Abnormal posture: Secondary | ICD-10-CM

## 2016-08-14 NOTE — Patient Instructions (Signed)
HIP: Hamstrings - Short Sitting    Rest leg on raised surface. Keep knee straight. Lift chest. Hold _30__ seconds. __3_ reps per set, _1-2__ sets per day, __5-7_ days per week  Copyright  VHI. All rights reserved.

## 2016-08-14 NOTE — Therapy (Signed)
[place], [place], [address] Phone: [phone]   Fax:  [phone]  Physical Therapy Treatment  Patient Details  Name: Pamela Whitaker MRN: [id number] Date of Birth: [birth year] Referring Provider: Dr. Gerrit Halls   Encounter Date: 08/14/2016      PT End of Session - 08/14/16 1106    Visit Number 18   Number of Visits 22   Date for PT Re-Evaluation 08/22/16   Authorization Type UHC MCR complete    Authorization Time Period No visit limit    PT Start Time 1100   PT Stop Time 1146   PT Time Calculation (min) 46 min   Activity Tolerance Patient tolerated treatment well   Behavior During Therapy [facility] for tasks assessed/performed      Past Medical History:  Diagnosis Date  . Atrial fibrillation ([place])    treated with multaq x 6 mos in 2011....[name]=2 (Age; + CHF; no CVA; no DM2). Echo 05/24/10: EF 30% mild AS(mean 41mgHg); mild LAE; mod LAE; PASP 35 Systolic CHF  . Breast cancer ([place]     in 1987 with a left  mastectomy  . Cardiomyopathy    a. tachy mediated Myoview 06/06/10:  EF 31%; no scar or ischemia; EF up to 50% per echo November 2013  . Diverticulosis   . DJD (degenerative joint disease) of knee    right knee  . Falls   . Hypercholesteremia   . Hypertension   . Lumbar stenosis   . Osteoporosis    history of bone chips in the right knee,previous right fibular fracture, operated on by Dr. NVeverly Fells . RLS (restless legs syndrome)   . Vitamin D deficiency     Past Surgical History:  Procedure Laterality Date  . BREAST RECONSTRUCTION  1988   left breast  . CARDIOVERSION  2012  . KNEE ARTHROSCOPY  2005  . MASTECTOMY, RADICAL  1987   left breast  . WRIST FRACTURE SURGERY     left wrist    There were no vitals filed for this visit.      Subjective Assessment - 08/14/16 1253    Subjective Daughter came in with her today, She has no complaints.     Currently in Pain? No/denies                          [facility]Ex[facility] - 08/14/16 1104      Knee/Hip Exercises: Stretches   Active Hamstring Stretch 2 reps;30 seconds   Active Hamstring Stretch Limitations Rt. LE seated      Knee/Hip Exercises: Seated   Long Arc Quad Strengthening;Right;1 set;10 reps   Long Arc Quad Limitations 10 sec hold   Ball Squeeze x 20    Clamshell with TheraBand Red   Knee/Hip Flexion sit to stand with red band to help lateral hip strength   x 10 used RW for safety      Knee/Hip Exercises: Supine   Bridges Strengthening;Both;1 set;15 reps     Knee/Hip Exercises: Sidelying   Hip ABduction Strengthening;Both;2 sets;10 reps   Hip ADduction Strengthening;Both;2 sets;10 reps   Clams x 20 each side                 PT Education - 08/14/16 1110    Education provided Yes   Education Details DO YOUR HEP    Person(s) Educated Patient   Methods Explanation   Comprehension Verbalized understanding;Need further instruction  PT Short Term Goals - 08/07/16 1025      PT SHORT TERM GOAL #1   Title "Independent with initial HEP   Status Achieved     PT SHORT TERM GOAL #2   Title Pt will complete Berg Balance Screen and understand results, modify home if needed.    Status Achieved     PT SHORT TERM GOAL #3   Title Pt will complete sit to stand with greater ease at normal chair heights.     Status Achieved     PT SHORT TERM GOAL #4   Title Pt will complete Assessment of Balance Confidence and set goal.    Status Deferred     PT SHORT TERM GOAL #5   Title Pt will perform BERG and set long term goal for reduction of fall risk   Status Achieved           PT Long Term Goals - 08/12/16 1346      PT LONG TERM GOAL #1   Title Pt will improve balance confidence per patient report in home environment.    Baseline will cont to work   Status Achieved     PT LONG TERM GOAL #2   Title Patient will report no pain in right knee with  ambulation in church, up to 30 min.    Status On-going     PT LONG TERM GOAL #3   Title Berg Score will improve by 6 or more points to demo improved fall risk.    Status Achieved     PT LONG TERM GOAL #4   Title Pt will improve hip abduction to 4/5 or more to aid in gait stability.    Status On-going     PT LONG TERM GOAL #5   Title Pt will be able to control knee hyperxtension with gait with min to no cueing.    Baseline needs cues >50% of the time    Status Partially Met               Plan - 08/14/16 1108    Clinical Impression Statement Working towards strength in hips today, functional strengthening.  She needs min A in UEs to stand without walker.     PT Next Visit Plan continue wall slides facing wall,  hip strengthening.gait, hip abd , add.  Begin wrap up and DC plans    PT Home Exercise Plan level 1-2 knee and added clam in sidelying bridge and knee to chest , reissued 07/22/16   Consulted and Agree with Plan of Care Patient      Patient will benefit from skilled therapeutic intervention in order to improve the following deficits and impairments:  Abnormal gait, Decreased activity tolerance, Decreased balance, Decreased mobility, Difficulty walking, Impaired flexibility, Decreased endurance, Pain  Visit Diagnosis: Muscle weakness (generalized)  Difficulty in walking, not elsewhere classified  Abnormal posture  Repeated falls  Chronic pain of right knee     Problem List Patient Active Problem List   Diagnosis Date Noted  . Urinary incontinence 10/14/2015  . Encounter for Medicare annual wellness exam 12/06/2014  . HX: breast cancer 06/29/2014  . Hypertension   . Vitamin D deficiency   . Spinal stenosis, lumbar region, with neurogenic claudication 03/28/2013    Class: Chronic  . At high risk for falls 02/23/2013  . Systolic heart failure ([place]) 06/07/2010  . Congestive dilated cardiomyopathy ([place]) 05/29/2010  . Atrial fibrillation ([place]) 04/16/2009  .  Elevated lipids 04/13/2009  .  Osteoarthritis 04/13/2009    Reece Fehnel 08/14/2016, 1:03 PM  [facility] [address] [place], [place], [address] Phone: [phone]   Fax:  [phone]  Name: ROSHA COCKER MRN: [id number] Date of Birth: [birth year]  Raeford Razor, PT 08/14/16 1:04 PM Phone: [phone] Fax: [phone]

## 2016-08-19 ENCOUNTER — Ambulatory Visit: Payer: Medicare Other | Admitting: Physical Therapy

## 2016-08-19 DIAGNOSIS — R262 Difficulty in walking, not elsewhere classified: Secondary | ICD-10-CM

## 2016-08-19 DIAGNOSIS — M6281 Muscle weakness (generalized): Secondary | ICD-10-CM | POA: Diagnosis not present

## 2016-08-19 DIAGNOSIS — R296 Repeated falls: Secondary | ICD-10-CM

## 2016-08-19 DIAGNOSIS — G8929 Other chronic pain: Secondary | ICD-10-CM

## 2016-08-19 DIAGNOSIS — M25561 Pain in right knee: Secondary | ICD-10-CM

## 2016-08-19 DIAGNOSIS — R293 Abnormal posture: Secondary | ICD-10-CM

## 2016-08-19 NOTE — Therapy (Signed)
[place], [place], [address] Phone: [phone]   Fax:  [phone]  Physical Therapy Treatment  Patient Details  Name: Pamela Whitaker MRN: [id number] Date of Birth: [birth year] Referring Provider: Dr. Gerrit Halls   Encounter Date: 08/19/2016      PT End of Session - 08/19/16 1039    Visit Number 19   Number of Visits 22   Date for PT Re-Evaluation 08/22/16   Authorization Type UHC MCR complete    Authorization Time Period No visit limit    PT Start Time 1017   PT Stop Time 1115   PT Time Calculation (min) 58 min   Activity Tolerance Patient tolerated treatment well   Behavior During Therapy [facility] for tasks assessed/performed      Past Medical History:  Diagnosis Date  . Atrial fibrillation ([place])    treated with multaq x 6 mos in 2011....[name]=2 (Age; + CHF; no CVA; no DM2). Echo 05/24/10: EF 30% mild AS(mean 35mgHg); mild LAE; mod LAE; PASP 35 Systolic CHF  . Breast cancer ([place]     in 1987 with a left  mastectomy  . Cardiomyopathy    a. tachy mediated Myoview 06/06/10:  EF 31%; no scar or ischemia; EF up to 50% per echo November 2013  . Diverticulosis   . DJD (degenerative joint disease) of knee    right knee  . Falls   . Hypercholesteremia   . Hypertension   . Lumbar stenosis   . Osteoporosis    history of bone chips in the right knee,previous right fibular fracture, operated on by Dr. NVeverly Fells . RLS (restless legs syndrome)   . Vitamin D deficiency     Past Surgical History:  Procedure Laterality Date  . BREAST RECONSTRUCTION  1988   left breast  . CARDIOVERSION  2012  . KNEE ARTHROSCOPY  2005  . MASTECTOMY, RADICAL  1987   left breast  . WRIST FRACTURE SURGERY     left wrist    There were no vitals filed for this visit.      Subjective Assessment - 08/19/16 1038    Subjective No complaints today , no pain.  Had a nice visit with her daughter.     Currently in Pain?  No/denies            [facility] - 08/19/16 0001      AROM   Right Knee Extension 7   Right Knee Flexion 128   Left Knee Extension 3   Left Knee Flexion 130            [facility] Adult PT Treatment/Exercise - 08/19/16 0001      Self-Care   Other Self-Care Comments  HEP , progress, manual stretch, routine for stretching      Knee/Hip Exercises: Stretches   Active Hamstring Stretch 3 reps;30 seconds   Active Hamstring Stretch Limitations Rt. LE seated    Gastroc Stretch Both;2 reps;60 seconds     Knee/Hip Exercises: Aerobic   Nustep LE and UE  only 5 min for LE strength level 5      Knee/Hip Exercises: Supine   Bridges Strengthening;Both;1 set;15 reps   Straight Leg Raises Strengthening;Both;1 set;15 reps   Straight Leg Raises Limitations quad lag     Knee/Hip Exercises: Sidelying   Hip ABduction Strengthening;Both;2 sets;10 reps     Moist Heat Therapy   Number Minutes Moist Heat 8 Minutes   Moist Heat Location Lumbar Spine  Cryotherapy   Number Minutes Cryotherapy 8 Minutes   Cryotherapy Location Knee   Type of Cryotherapy Ice pack     Manual Therapy   Manual Therapy Joint mobilization;Passive ROM   Joint Mobilization Gr I-II gentle into extension    Passive ROM extension, patellar mobs and hamstring stretching                PT Education - 08/19/16 1120    Education provided Yes   Education Details hamstring stretching and knee ROM , full HEP    Person(s) Educated Patient   Methods Explanation;Handout   Comprehension Verbalized understanding          PT Short Term Goals - 08/07/16 1025      PT SHORT TERM GOAL #1   Title "Independent with initial HEP   Status Achieved     PT SHORT TERM GOAL #2   Title Pt will complete Berg Balance Screen and understand results, modify home if needed.    Status Achieved     PT SHORT TERM GOAL #3   Title Pt will complete sit to stand with greater ease at normal chair heights.     Status  Achieved     PT SHORT TERM GOAL #4   Title Pt will complete Assessment of Balance Confidence and set goal.    Status Deferred     PT SHORT TERM GOAL #5   Title Pt will perform BERG and set long term goal for reduction of fall risk   Status Achieved           PT Long Term Goals - 08/12/16 1346      PT LONG TERM GOAL #1   Title Pt will improve balance confidence per patient report in home environment.    Baseline will cont to work   Status Achieved     PT LONG TERM GOAL #2   Title Patient will report no pain in right knee with ambulation in church, up to 30 min.    Status On-going     PT LONG TERM GOAL #3   Title Berg Score will improve by 6 or more points to demo improved fall risk.    Status Achieved     PT LONG TERM GOAL #4   Title Pt will improve hip abduction to 4/5 or more to aid in gait stability.    Status On-going     PT LONG TERM GOAL #5   Title Pt will be able to control knee hyperxtension with gait with min to no cueing.    Baseline needs cues >50% of the time    Status Partially Met               Plan - 08/19/16 1052    Clinical Impression Statement Pt able to do HEP with mod cueing.  Has still not done her hamstring stretch.  AROM slightly improved in knees.  She will be prepared for discharge next visit.    PT Next Visit Plan review (last appt) HEP and stretch hamstrings! Gait and final wrap up.    PT Home Exercise Plan knee level 1-2, bridge, hamstring SLR flex and abd, clam in sidelying    Consulted and Agree with Plan of Care Patient      Patient will benefit from skilled therapeutic intervention in order to improve the following deficits and impairments:  Abnormal gait, Decreased activity tolerance, Decreased balance, Decreased mobility, Difficulty walking, Impaired flexibility, Decreased endurance, Pain  Visit Diagnosis: Muscle weakness (  generalized)  Difficulty in walking, not elsewhere classified  Abnormal posture  Repeated  falls  Chronic pain of right knee     Problem List Patient Active Problem List   Diagnosis Date Noted  . Urinary incontinence 10/14/2015  . Encounter for Medicare annual wellness exam 12/06/2014  . HX: breast cancer 06/29/2014  . Hypertension   . Vitamin D deficiency   . Spinal stenosis, lumbar region, with neurogenic claudication 03/28/2013    Class: Chronic  . At high risk for falls 02/23/2013  . Systolic heart failure ([place]) 06/07/2010  . Congestive dilated cardiomyopathy ([place]) 05/29/2010  . Atrial fibrillation ([place]) 04/16/2009  . Elevated lipids 04/13/2009  . Osteoarthritis 04/13/2009    Cameron Schwinn 08/19/2016, 11:27 AM  [facility] [address] [place], [place], [address] Phone: [phone]   Fax:  [phone]  Name: ANAVICTORIA WILK MRN: [id number] Date of Birth: [birth year]  Raeford Razor, PT 08/19/16 11:27 AM Phone: [phone] Fax: [phone]

## 2016-08-21 ENCOUNTER — Ambulatory Visit: Payer: Medicare Other | Admitting: Physical Therapy

## 2016-08-21 ENCOUNTER — Ambulatory Visit (HOSPITAL_COMMUNITY): Payer: Medicare Other

## 2016-08-21 ENCOUNTER — Ambulatory Visit (HOSPITAL_COMMUNITY)
Admission: RE | Admit: 2016-08-21 | Discharge: 2016-08-21 | Disposition: A | Discharge status: Home / Self Care | Payer: Medicare Other | Source: Ambulatory Visit | Attending: Oncology | Admitting: Oncology

## 2016-08-21 ENCOUNTER — Other Ambulatory Visit (HOSPITAL_COMMUNITY): Payer: Self-pay | Admitting: Oncology

## 2016-08-21 DIAGNOSIS — Z1231 Encounter for screening mammogram for malignant neoplasm of breast: Secondary | ICD-10-CM | POA: Diagnosis present

## 2016-08-21 DIAGNOSIS — R293 Abnormal posture: Secondary | ICD-10-CM

## 2016-08-21 DIAGNOSIS — Z853 Personal history of malignant neoplasm of breast: Secondary | ICD-10-CM

## 2016-08-21 DIAGNOSIS — G8929 Other chronic pain: Secondary | ICD-10-CM

## 2016-08-21 DIAGNOSIS — M6281 Muscle weakness (generalized): Secondary | ICD-10-CM | POA: Diagnosis not present

## 2016-08-21 DIAGNOSIS — R296 Repeated falls: Secondary | ICD-10-CM

## 2016-08-21 DIAGNOSIS — R262 Difficulty in walking, not elsewhere classified: Secondary | ICD-10-CM

## 2016-08-21 DIAGNOSIS — M25561 Pain in right knee: Secondary | ICD-10-CM

## 2016-08-21 NOTE — Therapy (Signed)
Pamela Whitaker, [place], [address] Phone: [phone]   Fax:  [phone]  Physical Therapy Treatment and Discharge  Patient Details  Name: Pamela Whitaker MRN: [id number] Date of Birth: 05-16-1933 Referring Provider: Dr. Gerrit Halls   Encounter Date: 08/21/2016      PT End of Session - 08/21/16 1233    Visit Number 20   Date for PT Re-Evaluation 08/22/16   PT Start Time 1148   PT Stop Time 1215   PT Time Calculation (min) 27 min   Activity Tolerance Patient tolerated treatment well   Behavior During Therapy [facility] for tasks assessed/performed      Past Medical History:  Diagnosis Date  . Atrial fibrillation ([name])    treated with multaq x 6 mos in 2011....[name]=2 (Age; + CHF; no CVA; no DM2). Echo 05/24/10: EF 30% mild AS(mean 30mgHg); mild LAE; mod LAE; PASP 35 Systolic CHF  . [name]     in [place] with a left  mastectomy  . Cardiomyopathy    a. tachy mediated Myoview 06/06/10:  EF 31%; no scar or ischemia; EF up to 50% per echo November 2013  . Diverticulosis   . DJD (degenerative joint disease) of knee    right knee  . Falls   . Hypercholesteremia   . Hypertension   . Lumbar stenosis   . Osteoporosis    history of bone chips in the right knee,previous right fibular fracture, operated on by Dr. NVeverly Fells . RLS (restless legs syndrome)   . Vitamin D deficiency     Past Surgical History:  Procedure Laterality Date  . BREAST RECONSTRUCTION  1988   left breast  . CARDIOVERSION  2012  . KNEE ARTHROSCOPY  2005  . MASTECTOMY, RADICAL  1987   left breast  . WRIST FRACTURE SURGERY     left wrist    There were no vitals filed for this visit.      Subjective Assessment - 08/21/16 1230    Subjective Been very busy, had a birthday, went to class (water) yesterday.  No pain today.  HAs to leave early for appt.    Currently in Pain? No/denies            [facility]  [facility]      AROM   Right Knee Extension 7   Right Knee Flexion 128   Left Knee Extension 3   Left Knee Flexion 130                     [facility] Adult PT Treatment/Exercise - 08/21/16 1230      Berg Balance Test   Sit to Stand Able to stand  independently using hands   Standing Unsupported Able to stand 2 minutes with supervision   Sitting with Back Unsupported but Feet Supported on Floor or Stool Able to sit safely and securely 2 minutes   Stand to Sit Controls descent by using hands  last min "flop"   Transfers Able to transfer safely, definite need of hands   Standing Unsupported with Eyes Closed Able to stand 10 seconds with supervision   Standing Ubsupported with Feet Together Needs help to attain position but able to stand for 30 seconds with feet together   From Standing, Reach Forward with Outstretched Arm Can reach forward >5 cm safely (2")   From Standing Position, Pick up Object from Floor Unable to pick up and needs supervision  used chair for A and PT    From Standing Position, Turn to Look Behind Over each Shoulder Looks behind one side only/other side shows less weight shift   Turn 360 Degrees Needs assistance while turning   Standing Unsupported, Alternately Place Feet on Step/Stool Needs assistance to keep from falling or unable to try   Standing Unsupported, One Foot in [employer] balance while stepping or standing   Standing on One Leg Unable to try or needs assist to prevent fall   Total Score 26             PT Education - 08/21/16 1232    Education provided Yes   Education Details DC, group therapy for accountibility    Person(s) Educated Patient   Methods Explanation;Handout   Comprehension Verbalized understanding          PT Short Term Goals - 08/21/16 1150      PT SHORT TERM GOAL #1   Title "Independent with initial HEP   Status Achieved     PT SHORT TERM GOAL #2   Title Pt will complete Berg Balance Screen and  understand results, modify home if needed.    Status Achieved     PT SHORT TERM GOAL #3   Title Pt will complete sit to stand with greater ease at normal chair heights.     Status Achieved     PT SHORT TERM GOAL #4   Title Pt will complete Assessment of Balance Confidence and set goal.    Status Deferred     PT SHORT TERM GOAL #5   Title Pt will perform BERG and set long term goal for reduction of fall risk   Status Achieved           PT Long Term Goals - 08/21/16 1152      PT LONG TERM GOAL #1   Title Pt will improve balance confidence per patient report in home environment.      PT LONG TERM GOAL #2   Title Patient will report no pain in right knee with ambulation in church, up to 30 min.    Baseline has pain when she squats, stoops , pain usually min with walking.  Sometime when she turn the wrong way    Status Partially Met     PT LONG TERM GOAL #3   Title Berg Score will improve by 6 or more points to demo improved fall risk.    Status Achieved     PT LONG TERM GOAL #4   Title Pt will improve hip abduction to 4/5 or more to aid in gait stability.                Plan - 08/21/16 1238    Clinical Impression Statement Pt with only 1 point increase in Berg Balance score.  She is sure to fall without use of her RW but she understands that and has adopted safety measures into her home, daily activity.  She has met several LTGs She may come to our group therapy (cash based) program if she feels she is declining.    PT Next Visit Plan NA   PT Home Exercise Plan knee level 1-2, bridge, hamstring SLR flex and abd, clam in sidelying       Patient will benefit from skilled therapeutic intervention in order to improve the following deficits and impairments:     Visit Diagnosis: Muscle weakness (generalized)  Difficulty in walking, not elsewhere classified  Abnormal posture  Repeated falls  Chronic pain of right knee       G-Codes - 06-Sep-2016 1236     Functional Assessment Tool Used (Outpatient Only) clinical judgement    Functional Limitation Mobility: Walking and moving around   Mobility: Walking and Moving Around Current Status [phone]) At least 40 percent but less than 60 percent impaired, limited or restricted   Mobility: Walking and Moving Around Goal Status [phone]) At least 40 percent but less than 60 percent impaired, limited or restricted   Mobility: Walking and Moving Around Discharge Status [phone]) At least 40 percent but less than 60 percent impaired, limited or restricted      Problem List Patient Active Problem List   Diagnosis Date Noted  . Urinary incontinence 10/14/2015  . Encounter for Medicare annual wellness exam 12/06/2014  . HX: breast cancer 06/29/2014  . Hypertension   . Vitamin D deficiency   . Spinal stenosis, lumbar region, with neurogenic claudication 03/28/2013    Class: Chronic  . At high risk for falls 02/23/2013  . Systolic heart failure ([place]) 06/07/2010  . Congestive dilated cardiomyopathy ([place]) 05/29/2010  . Atrial fibrillation ([place]) 04/16/2009  . Elevated lipids 04/13/2009  . Osteoarthritis 04/13/2009    Halina Asano 2016/09/06, 12:42 PM  [facility] [address] [place], [place], [address] Phone: [phone]   Fax:  [phone]  Name: PHILOMENA BUTTERMORE MRN: [id number] Date of Birth: July 26, 1932  Raeford Razor, PT September 06, 2016 12:42 PM Phone: [phone] Fax: [phone]   PHYSICAL THERAPY DISCHARGE SUMMARY  Visits from Start of Care: 20   Current functional level related to goals / functional outcomes: Patient needs RW for gait at all times.  She has knee malalignment, hip abduction weakness and gait instability.  She has maximized her function with skilled PT    Remaining deficits: Balance, gait, LE strength, knee AROM   Education / Equipment: Posture, HEP, balance, RW Plan: Patient agrees to discharge.  Patient goals were  partially met. Patient is being discharged due to being pleased with the current functional level.  ?????   Patient plans to cont HEP and attend her water aerobics class 2 times per week.  She may consider our cash based group program if she notices a decline in mobility or motivation.    Raeford Razor, PT 09/06/16 2:35 PM Phone: [phone] Fax: [phone]

## 2016-08-28 ENCOUNTER — Other Ambulatory Visit: Payer: Self-pay | Admitting: *Deleted

## 2016-08-28 ENCOUNTER — Other Ambulatory Visit: Payer: Self-pay | Admitting: Cardiovascular Disease

## 2016-08-28 NOTE — Telephone Encounter (Signed)
Age 83 Wt 69.8kg (07/23/2016) Saw Dr Johnsie Cancel on 06/05/2016 07/23/2016 Creatinine 0.71 07/23/2016 Hgb 12.6 HCT 38.0 CrCl 67.31 Refill done for Xarelto 20 mg daily as requested

## 2016-09-08 ENCOUNTER — Telehealth: Payer: Self-pay | Admitting: Podiatry

## 2016-09-09 ENCOUNTER — Ambulatory Visit (INDEPENDENT_AMBULATORY_CARE_PROVIDER_SITE_OTHER): Payer: Medicare Other | Admitting: Podiatry

## 2016-09-09 ENCOUNTER — Encounter: Payer: Self-pay | Admitting: Podiatry

## 2016-09-09 DIAGNOSIS — M76829 Posterior tibial tendinitis, unspecified leg: Secondary | ICD-10-CM

## 2016-09-09 DIAGNOSIS — M2140 Flat foot [pes planus] (acquired), unspecified foot: Secondary | ICD-10-CM

## 2016-09-09 NOTE — Progress Notes (Signed)
She presents today with a chief complaint of pain to the bilateral foot. She also like me to write a prescription to [employer] 4  Orthotics to be made.  Objective: Vital signs are stable she is alert and oriented 3. Pulses are palpable. Severe posterior tibial tendon dysfunction and osteoarthritic changes across the dorsum of the foot with hallux valgus deformity and hammertoe deformities.  Assessment: Posterior tibial tendon dysfunction right greater than left.  Plan: Wrote a prescription for [employer] for a pair of orthotics.

## 2016-10-14 ENCOUNTER — Encounter: Payer: Medicare Other | Admitting: Internal Medicine

## 2016-10-29 ENCOUNTER — Ambulatory Visit (INDEPENDENT_AMBULATORY_CARE_PROVIDER_SITE_OTHER): Payer: Medicare Other | Admitting: Internal Medicine

## 2016-10-29 ENCOUNTER — Encounter: Payer: Self-pay | Admitting: Internal Medicine

## 2016-10-29 ENCOUNTER — Other Ambulatory Visit (INDEPENDENT_AMBULATORY_CARE_PROVIDER_SITE_OTHER): Payer: Medicare Other

## 2016-10-29 VITALS — BP 150/76 | HR 83 | Temp 97.6°F | Resp 14 | Ht 64.0 in | Wt 154.0 lb

## 2016-10-29 DIAGNOSIS — I10 Essential (primary) hypertension: Secondary | ICD-10-CM | POA: Diagnosis not present

## 2016-10-29 DIAGNOSIS — I482 Chronic atrial fibrillation, unspecified: Secondary | ICD-10-CM

## 2016-10-29 DIAGNOSIS — Z0000 Encounter for general adult medical examination without abnormal findings: Secondary | ICD-10-CM | POA: Diagnosis not present

## 2016-10-29 DIAGNOSIS — R7989 Other specified abnormal findings of blood chemistry: Secondary | ICD-10-CM

## 2016-10-29 DIAGNOSIS — M48061 Spinal stenosis, lumbar region without neurogenic claudication: Secondary | ICD-10-CM

## 2016-10-29 DIAGNOSIS — N393 Stress incontinence (female) (male): Secondary | ICD-10-CM | POA: Diagnosis not present

## 2016-10-29 LAB — LIPID PANEL
CHOL/HDL RATIO: 5
CHOLESTEROL: 211 mg/dL — AB (ref 0–200)
HDL: 45.5 mg/dL (ref >39.00)
NONHDL: 165.46
Triglycerides: 355 mg/dL — ABNORMAL HIGH (ref 0.0–149.0)
VLDL: 71 mg/dL — ABNORMAL HIGH (ref 0.0–40.0)

## 2016-10-29 LAB — LDL CHOLESTEROL, DIRECT: Direct LDL: 100 mg/dL

## 2016-10-29 MED ORDER — ALENDRONATE SODIUM 70 MG PO TABS: 70.0000 mg | ORAL_TABLET | ORAL | 11 refills | Status: DC

## 2016-10-29 NOTE — Progress Notes (Signed)
   Subjective:    Patient ID: Pamela Whitaker, female    DOB: 09/21/1932, 83 y.o.   MRN: [id number]  HPI Here for medicare wellness and physical, no new complaints. Please see A/P for status and treatment of chronic medical problems.   Diet: heart healthy Physical activity: sedentary Depression/mood screen: negative Hearing: intact to whispered voice, mild loss bilaterally Visual acuity: grossly normal with lens, performs annual eye exam  ADLs: capable Fall risk: medium Home safety: good Cognitive evaluation: intact to orientation, naming, recall and repetition EOL planning: adv directives discussed  I have personally reviewed and have noted 1. The patient's medical and social history - reviewed today no changes 2. Their use of alcohol, tobacco or illicit drugs 3. Their current medications and supplements 4. The patient's functional ability including ADL's, fall risks, home safety risks and hearing or visual impairment. 5. Diet and physical activities 6. Evidence for depression or mood disorders 7. Care team reviewed and updated (available in snapshot)  Review of Systems  Constitutional: Negative.   HENT: Negative.   Eyes: Negative.   Respiratory: Negative for cough, chest tightness and shortness of breath.   Cardiovascular: Negative for chest pain, palpitations and leg swelling.  Gastrointestinal: Negative for abdominal distention, abdominal pain, constipation, diarrhea, nausea and vomiting.  Musculoskeletal: Positive for arthralgias. Negative for back pain, gait problem, myalgias and neck pain.  Skin: Negative.   Neurological: Negative.   Psychiatric/Behavioral: Negative.       Objective:   Physical Exam  Constitutional: She is oriented to person, place, and time. She appears well-developed and well-nourished.  HENT:  Head: Normocephalic and atraumatic.  Eyes: EOM are normal.  Neck: Normal range of motion.  Cardiovascular: Normal rate and regular rhythm.     Pulmonary/Chest: Effort normal and breath sounds normal. No respiratory distress. She has no wheezes. She has no rales.  Abdominal: Soft. Bowel sounds are normal. She exhibits no distension. There is no tenderness. There is no rebound.  Musculoskeletal: She exhibits no edema.  Neurological: She is alert and oriented to person, place, and time. Coordination normal.  Skin: Skin is warm and dry.  Psychiatric: She has a normal mood and affect.   Vitals:   10/29/16 0912  BP: (!) 146/78  Pulse: 83  Resp: 14  Temp: 97.6 F (36.4 C)  TempSrc: Oral  SpO2: 99%  Weight: 154 lb (69.9 kg)  Height: 5\' 4"  (1.626 m)      Assessment & Plan:

## 2016-10-29 NOTE — Patient Instructions (Signed)
We will check the labs today.   We have sent in the bone medicine called fosamax that you take once a week. We will want to check the bone density about 1 year after starting this medicine. Make sure to take with a full glass of water and do not lie down for 30 minutes after taking.   We will get you in with the back specialist to talk about the options for the back pain.  Health Maintenance, Female Adopting a healthy lifestyle and getting preventive care can go a long way to promote health and wellness. Talk with your health care provider about what schedule of regular examinations is right for you. This is a good chance for you to check in with your provider about disease prevention and staying healthy. In between checkups, there are plenty of things you can do on your own. Experts have done a lot of research about which lifestyle changes and preventive measures are most likely to keep you healthy. Ask your health care provider for more information. Weight and diet Eat a healthy diet  Be sure to include plenty of vegetables, fruits, low-fat dairy products, and lean protein.  Do not eat a lot of foods high in solid fats, added sugars, or salt.  Get regular exercise. This is one of the most important things you can do for your health. ? Most adults should exercise for at least 150 minutes each week. The exercise should increase your heart rate and make you sweat (moderate-intensity exercise). ? Most adults should also do strengthening exercises at least twice a week. This is in addition to the moderate-intensity exercise.  Maintain a healthy weight  Body mass index (BMI) is a measurement that can be used to identify possible weight problems. It estimates body fat based on height and weight. Your health care provider can help determine your BMI and help you achieve or maintain a healthy weight.  For females 27 years of age and older: ? A BMI below 18.5 is considered underweight. ? A BMI of  18.5 to 24.9 is normal. ? A BMI of 25 to 29.9 is considered overweight. ? A BMI of 30 and above is considered obese.  Watch levels of cholesterol and blood lipids  You should start having your blood tested for lipids and cholesterol at 81 years of age, then have this test every 5 years.  You may need to have your cholesterol levels checked more often if: ? Your lipid or cholesterol levels are high. ? You are older than 81 years of age. ? You are at high risk for heart disease.  Cancer screening Lung Cancer  Lung cancer screening is recommended for adults 20-43 years old who are at high risk for lung cancer because of a history of smoking.  A yearly low-dose CT scan of the lungs is recommended for people who: ? Currently smoke. ? Have quit within the past 15 years. ? Have at least a 30-pack-year history of smoking. A pack year is smoking an average of one pack of cigarettes a day for 1 year.  Yearly screening should continue until it has been 15 years since you quit.  Yearly screening should stop if you develop a health problem that would prevent you from having lung cancer treatment.  Breast Cancer  Practice breast self-awareness. This means understanding how your breasts normally appear and feel.  It also means doing regular breast self-exams. Let your health care provider know about any changes, no matter how small.  If you are in your 20s or 30s, you should have a clinical breast exam (CBE) by a health care provider every 1-3 years as part of a regular health exam.  If you are 75 or older, have a CBE every year. Also consider having a breast X-ray (mammogram) every year.  If you have a family history of breast cancer, talk to your health care provider about genetic screening.  If you are at high risk for breast cancer, talk to your health care provider about having an MRI and a mammogram every year.  Breast cancer gene (BRCA) assessment is recommended for women who have  family members with BRCA-related cancers. BRCA-related cancers include: ? Breast. ? Ovarian. ? Tubal. ? Peritoneal cancers.  Results of the assessment will determine the need for genetic counseling and BRCA1 and BRCA2 testing.  Cervical Cancer Your health care provider may recommend that you be screened regularly for cancer of the pelvic organs (ovaries, uterus, and vagina). This screening involves a pelvic examination, including checking for microscopic changes to the surface of your cervix (Pap test). You may be encouraged to have this screening done every 3 years, beginning at age 69.  For women ages 54-65, health care providers may recommend pelvic exams and Pap testing every 3 years, or they may recommend the Pap and pelvic exam, combined with testing for human papilloma virus (HPV), every 5 years. Some types of HPV increase your risk of cervical cancer. Testing for HPV may also be done on women of any age with unclear Pap test results.  Other health care providers may not recommend any screening for nonpregnant women who are considered low risk for pelvic cancer and who do not have symptoms. Ask your health care provider if a screening pelvic exam is right for you.  If you have had past treatment for cervical cancer or a condition that could lead to cancer, you need Pap tests and screening for cancer for at least 20 years after your treatment. If Pap tests have been discontinued, your risk factors (such as having a new sexual partner) need to be reassessed to determine if screening should resume. Some women have medical problems that increase the chance of getting cervical cancer. In these cases, your health care provider may recommend more frequent screening and Pap tests.  Colorectal Cancer  This type of cancer can be detected and often prevented.  Routine colorectal cancer screening usually begins at 81 years of age and continues through 81 years of age.  Your health care provider may  recommend screening at an earlier age if you have risk factors for colon cancer.  Your health care provider may also recommend using home test kits to check for hidden blood in the stool.  A small camera at the end of a tube can be used to examine your colon directly (sigmoidoscopy or colonoscopy). This is done to check for the earliest forms of colorectal cancer.  Routine screening usually begins at age 52.  Direct examination of the colon should be repeated every 5-10 years through 80 years of age. However, you may need to be screened more often if early forms of precancerous polyps or small growths are found.  Skin Cancer  Check your skin from head to toe regularly.  Tell your health care provider about any new moles or changes in moles, especially if there is a change in a mole's shape or color.  Also tell your health care provider if you have a mole that is  larger than the size of a pencil eraser.  Always use sunscreen. Apply sunscreen liberally and repeatedly throughout the day.  Protect yourself by wearing long sleeves, pants, a wide-brimmed hat, and sunglasses whenever you are outside.  Heart disease, diabetes, and high blood pressure  High blood pressure causes heart disease and increases the risk of stroke. High blood pressure is more likely to develop in: ? People who have blood pressure in the high end of the normal range (130-139/85-89 mm Hg). ? People who are overweight or obese. ? People who are African American.  If you are 67-38 years of age, have your blood pressure checked every 3-5 years. If you are 41 years of age or older, have your blood pressure checked every year. You should have your blood pressure measured twice-once when you are at a hospital or clinic, and once when you are not at a hospital or clinic. Record the average of the two measurements. To check your blood pressure when you are not at a hospital or clinic, you can use: ? An automated blood pressure  machine at a pharmacy. ? A home blood pressure monitor.  If you are between 25 years and 30 years old, ask your health care provider if you should take aspirin to prevent strokes.  Have regular diabetes screenings. This involves taking a blood sample to check your fasting blood sugar level. ? If you are at a normal weight and have a low risk for diabetes, have this test once every three years after 81 years of age. ? If you are overweight and have a high risk for diabetes, consider being tested at a younger age or more often. Preventing infection Hepatitis B  If you have a higher risk for hepatitis B, you should be screened for this virus. You are considered at high risk for hepatitis B if: ? You were born in a country where hepatitis B is common. Ask your health care provider which countries are considered high risk. ? Your parents were born in a high-risk country, and you have not been immunized against hepatitis B (hepatitis B vaccine). ? You have HIV or AIDS. ? You use needles to inject street drugs. ? You live with someone who has hepatitis B. ? You have had sex with someone who has hepatitis B. ? You get hemodialysis treatment. ? You take certain medicines for conditions, including cancer, organ transplantation, and autoimmune conditions.  Hepatitis C  Blood testing is recommended for: ? Everyone born from [birth year] through 1963. ? Anyone with known risk factors for hepatitis C.  Sexually transmitted infections (STIs)  You should be screened for sexually transmitted infections (STIs) including gonorrhea and chlamydia if: ? You are sexually active and are younger than 81 years of age. ? You are older than 81 years of age and your health care provider tells you that you are at risk for this type of infection. ? Your sexual activity has changed since you were last screened and you are at an increased risk for chlamydia or gonorrhea. Ask your health care provider if you are at  risk.  If you do not have HIV, but are at risk, it may be recommended that you take a prescription medicine daily to prevent HIV infection. This is called pre-exposure prophylaxis (PrEP). You are considered at risk if: ? You are sexually active and do not regularly use condoms or know the HIV status of your partner(s). ? You take drugs by injection. ? You are sexually active  with a partner who has HIV.  Talk with your health care provider about whether you are at high risk of being infected with HIV. If you choose to begin PrEP, you should first be tested for HIV. You should then be tested every 3 months for as long as you are taking PrEP. Pregnancy  If you are premenopausal and you may become pregnant, ask your health care provider about preconception counseling.  If you may become pregnant, take 400 to 800 micrograms (mcg) of folic acid every day.  If you want to prevent pregnancy, talk to your health care provider about birth control (contraception). Osteoporosis and menopause  Osteoporosis is a disease in which the bones lose minerals and strength with aging. This can result in serious bone fractures. Your risk for osteoporosis can be identified using a bone density scan.  If you are 87 years of age or older, or if you are at risk for osteoporosis and fractures, ask your health care provider if you should be screened.  Ask your health care provider whether you should take a calcium or vitamin D supplement to lower your risk for osteoporosis.  Menopause may have certain physical symptoms and risks.  Hormone replacement therapy may reduce some of these symptoms and risks. Talk to your health care provider about whether hormone replacement therapy is right for you. Follow these instructions at home:  Schedule regular health, dental, and eye exams.  Stay current with your immunizations.  Do not use any tobacco products including cigarettes, chewing tobacco, or electronic  cigarettes.  If you are pregnant, do not drink alcohol.  If you are breastfeeding, limit how much and how often you drink alcohol.  Limit alcohol intake to no more than 1 drink per day for nonpregnant women. One drink equals 12 ounces of beer, 5 ounces of wine, or 1 ounces of hard liquor.  Do not use street drugs.  Do not share needles.  Ask your health care provider for help if you need support or information about quitting drugs.  Tell your health care provider if you often feel depressed.  Tell your health care provider if you have ever been abused or do not feel safe at home. This information is not intended to replace advice given to you by your health care provider. Make sure you discuss any questions you have with your health care provider. Document Released: 11/25/2010 Document Revised: 10/18/2015 Document Reviewed: 02/13/2015 Elsevier Interactive Patient Education  [employer].

## 2016-10-31 NOTE — Assessment & Plan Note (Signed)
BP at goal on her coreg. Checking CMP and adjust as needed. Not complicated.

## 2016-10-31 NOTE — Assessment & Plan Note (Signed)
Taking myrbetriq sometimes and will work on making this more regular for better efficacy.

## 2016-10-31 NOTE — Assessment & Plan Note (Signed)
Takes coreg and xarelto and she was confused about why she takes xarelto and reminded her about increased risk of stroke and she agrees to continue.

## 2016-10-31 NOTE — Assessment & Plan Note (Signed)
Aged out of colonoscopy and declines more mammogram. Does not want another DEXA. Declines tetanus today. Pneumonia series and flu up to date. Counseled on sun safety and mole surveillance. Counseled on new shingrix vaccine. Given 10 year screening recommendations.

## 2016-11-04 ENCOUNTER — Ambulatory Visit (INDEPENDENT_AMBULATORY_CARE_PROVIDER_SITE_OTHER): Payer: Medicare Other | Admitting: *Deleted

## 2016-11-04 DIAGNOSIS — I10 Essential (primary) hypertension: Secondary | ICD-10-CM

## 2016-11-04 DIAGNOSIS — I480 Paroxysmal atrial fibrillation: Secondary | ICD-10-CM

## 2016-11-04 LAB — CBC WITH DIFFERENTIAL/PLATELET
BASOS ABS: 0.1 10*3/uL (ref 0.0–0.2)
BASOS: 1 %
EOS (ABSOLUTE): 0.1 10*3/uL (ref 0.0–0.4)
Eos: 1 %
Hematocrit: 38.9 % (ref 34.0–46.6)
Hemoglobin: 13.1 g/dL (ref 11.1–15.9)
IMMATURE GRANS (ABS): 0 10*3/uL (ref 0.0–0.1)
Immature Granulocytes: 0 %
LYMPHS ABS: 1.6 10*3/uL (ref 0.7–3.1)
Lymphs: 21 %
MCH: 30.4 pg (ref 26.6–33.0)
MCHC: 33.7 g/dL (ref 31.5–35.7)
MCV: 90 fL (ref 79–97)
MONOS ABS: 0.7 10*3/uL (ref 0.1–0.9)
Monocytes: 9 %
NEUTROS ABS: 5.3 10*3/uL (ref 1.4–7.0)
Neutrophils: 68 %
PLATELETS: 131 10*3/uL — AB (ref 150–379)
RBC: 4.31 x10E6/uL (ref 3.77–5.28)
RDW: 13 % (ref 12.3–15.4)
WBC: 7.8 10*3/uL (ref 3.4–10.8)

## 2016-11-04 LAB — BASIC METABOLIC PANEL
BUN / CREAT RATIO: 14 (ref 12–28)
BUN: 10 mg/dL (ref 8–27)
CALCIUM: 9.6 mg/dL (ref 8.7–10.3)
CHLORIDE: 103 mmol/L (ref 96–106)
CO2: 24 mmol/L (ref 20–29)
Creatinine, Ser: 0.7 mg/dL (ref 0.57–1.00)
GFR, EST AFRICAN AMERICAN: 93 mL/min/{1.73_m2} (ref >59)
GFR, EST NON AFRICAN AMERICAN: 81 mL/min/{1.73_m2} (ref >59)
Glucose: 80 mg/dL (ref 65–99)
POTASSIUM: 4.3 mmol/L (ref 3.5–5.2)
SODIUM: 142 mmol/L (ref 134–144)

## 2016-11-04 NOTE — Progress Notes (Signed)
Pt was started on Xarelto 20mg  daily for AFIB on 09/28/2015 by Dr. Johnsie Cancel.   Reviewed patients medication list.  Pt is not currently on any combined P-gp and strong CYP3A4 inhibitors/inducers (ketoconazole, traconazole, ritonavir, carbamazepine, phenytoin, rifampin, St. John's wort).  Reviewed labs: SCr-0.70, Hemoglobin-13.1, HCT-38.9, ZZCKIC-17.9GV, CrCl-67.85ml/min. Dose appropriate based on CrCl and dosing criteria.  Hgb and HCT within normal limits.    A full discussion of the nature of anticoagulants has been carried out.  A benefit/risk analysis has been presented to the patient, so that they understand the justification for choosing anticoagulation with Xarelto at this time.  The need for compliance is stressed.  Pt is aware to take the medication once daily with the largest meal of the day.  Side effects of potential bleeding are discussed, including unusual colored urine or stools, coughing up blood or coffee ground emesis, nose bleeds or serious fall or head trauma.  Discussed signs and symptoms of stroke. The patient should avoid any OTC items containing aspirin or ibuprofen.  Avoid alcohol consumption.   Call if any signs of abnormal bleeding.  Discussed financial obligations and resolved any difficulty in obtaining medication.  Next lab test in 6 months.   11/04/16-Labs resulted & reviewed with pt over the phone.  She was very thankful about her visit & assistance she received from this visit today.  Pt is aware to continue to taking Xarelto  20mg  with supper.  Also, scheduled pt for a 6 month follow-up appt in December 2018.

## 2016-11-28 ENCOUNTER — Ambulatory Visit (INDEPENDENT_AMBULATORY_CARE_PROVIDER_SITE_OTHER): Payer: Medicare Other | Admitting: Medical

## 2016-11-28 ENCOUNTER — Encounter: Payer: Self-pay | Admitting: Medical

## 2016-11-28 VITALS — BP 121/78 | HR 80 | Temp 98.3°F | Resp 16 | Ht 64.0 in | Wt 152.4 lb

## 2016-11-28 DIAGNOSIS — M48061 Spinal stenosis, lumbar region without neurogenic claudication: Secondary | ICD-10-CM | POA: Diagnosis not present

## 2016-11-28 DIAGNOSIS — M5126 Other intervertebral disc displacement, lumbar region: Secondary | ICD-10-CM

## 2016-11-28 DIAGNOSIS — M5410 Radiculopathy, site unspecified: Secondary | ICD-10-CM

## 2016-11-28 DIAGNOSIS — M5440 Lumbago with sciatica, unspecified side: Secondary | ICD-10-CM | POA: Diagnosis not present

## 2016-11-28 MED ORDER — TRAMADOL HCL 50 MG PO TABS: 50.0000 mg | ORAL_TABLET | Freq: Three times a day (TID) | ORAL | 0 refills | Status: DC | PRN

## 2016-11-28 NOTE — Patient Instructions (Addendum)
For back pain can use tylenol 1 tab po every 6 hours. Will write tramadol to use sparingly. Rx advisement. Have family present on initial dosing to make sure does not oversedate her.  Will try to order mri of lumbar spine.  Notify us on Monday how you are doing?  If pain persists then will refer you back to spine specialist.   Red flag symptoms discussed that would indicate ED evaluation.  Follow up Monday or Tuesday. At least call and update [place] if pain still persisting.  Some blood urine today. Will go ahead and get culture. When in for follow up with pcp or myself recommend repeat ua to see if blood persists.

## 2016-11-28 NOTE — Progress Notes (Signed)
Subjective:    Patient ID: Pamela Whitaker, female    DOB: 1933/04/13, 83 y.o.   MRN: [id number]  HPI  Pt in for evaluation. Pt has back pain for 3 days. Pain lower back. No injury or fall. At rest sitting pain low level. When stands or moves pain high 8/10 level pain. Pain when she stands feels like runs to her legs. Pt able to get up. Legs not week. Pt has some baseline incontinence. Some worse recently.  Pt saw spine specialist last week. She has known severe stenosis.     Pt had mri done in 2014.  Abnormal MRI lumbar spine (without) demonstrating: 1. At L4-5: disc bulging, ligamentum flavum hypertrophy, facet arthropathy with severe spinal stenosis, severe right and moderate-severe left foraminal stenosis  2. At L3-4: disc bulging and facet hypertrophy with mild biforaminal foraminal stenosis  3. At L5-S1: disc bulging and facet hypertrophy, with small left synovial cyst (41mm), with no spinal stenosis or foraminal narrowing 4. Compared to MRI on 06/04/07, there has been mild progression of spinal stenosis at L4-5.  Pt just saw back specialist the other day to see if back pain or leg weakness causing balance issues. But they referred to neurologist. Per pt and daughter they did not think balance issues from her back.  Pt only has taken tylenol for pain in the past.  She denies history of being on pain medications in the pain.   Review of Systems  Constitutional: Negative for chills and fatigue.  Respiratory: Negative for cough, chest tightness, shortness of breath and wheezing.   Cardiovascular: Negative for chest pain and palpitations.  Gastrointestinal: Negative for abdominal pain.  Genitourinary: Positive for frequency. Negative for dysuria, flank pain, hematuria and urgency.  Musculoskeletal: Positive for back pain. Negative for neck pain.  Skin: Negative for rash.  Neurological:       Radicular pain  Hematological: Negative for adenopathy. Does not bruise/bleed easily.    Psychiatric/Behavioral: Negative for behavioral problems and confusion.   Past Medical History:  Diagnosis Date  . Atrial fibrillation ([place])    treated with multaq x 6 mos in 2011....[name]=2 (Age; + CHF; no CVA; no DM2). Echo 05/24/10: EF 30% mild AS(mean 70mmgHg); mild LAE; mod LAE; PASP 35 Systolic CHF  . Breast cancer ([place])     in 1987 with a left  mastectomy  . Cardiomyopathy    a. tachy mediated Myoview 06/06/10:  EF 31%; no scar or ischemia; EF up to 50% per echo November 2013  . Diverticulosis   . DJD (degenerative joint disease) of knee    right knee  . Falls   . Hypercholesteremia   . Hypertension   . Lumbar stenosis   . Osteoporosis    history of bone chips in the right knee,previous right fibular fracture, operated on by Dr. Veverly Fells  . RLS (restless legs syndrome)   . Vitamin D deficiency      Social History   Social History  . Marital status: Married    Spouse name: Chrissie Noa  . Number of children: 2  . Years of education: college   Occupational History  . retired Retired   Social History Main Topics  . Smoking status: Never Smoker  . Smokeless tobacco: Never Used  . Alcohol use No  . Drug use: No  . Sexual activity: No   Other Topics Concern  . Not on file   Social History Narrative  . No narrative on file    Past Surgical  History:  Procedure Laterality Date  . BREAST RECONSTRUCTION  1988   left breast  . CARDIOVERSION  2012  . KNEE ARTHROSCOPY  2005  . MASTECTOMY, RADICAL  1987   left breast  . WRIST FRACTURE SURGERY     left wrist    Family History  Problem Relation Age of Onset  . Heart attack Father   . Aortic aneurysm Mother   . Breast cancer Sister   . Diabetes Sister   . Stomach cancer Sister     Allergies  Allergen Reactions  . Amiodarone     Stopped on her own  . Lipitor [Atorvastatin]     weakness  . Multaq [Dronedarone]     Stopped on her own    Current Outpatient Prescriptions on File Prior to Visit  Medication  Sig Dispense Refill  . acetaminophen (TYLENOL) 500 MG tablet Take 500 mg by mouth every 6 (six) hours as needed (pt takes 1-2 tablets at night for pain).    Marland Kitchen alendronate (FOSAMAX) 70 MG tablet Take 1 tablet (70 mg total) by mouth every 7 (seven) days. Take with a full glass of water on an empty stomach. 4 tablet 11  . carvedilol (COREG) 6.25 MG tablet TAKE (12.5 MG) 2 TABLETS BY MOUTH IN AM AND TAKE (6.25 MG) 1 TABLET BY MOUTH IN PM 90 tablet 10  . Cholecalciferol (VITAMIN D) 2000 UNITS tablet Take 2,000 Units by mouth daily. Does not take regularly    . mirabegron ER (MYRBETRIQ) 25 MG TB24 tablet Take 1 tablet (25 mg total) by mouth daily. 30 tablet 6  . Multiple Vitamin (MULTIVITAMIN) capsule Take 1 capsule by mouth daily. Women's Centrum    . rivaroxaban (XARELTO) 20 MG TABS tablet Take 1 tablet (20 mg total) by mouth daily with supper. 30 tablet 11   No current facility-administered medications on file prior to visit.     BP 121/78 (BP Location: Right Arm, Patient Position: Sitting, Cuff Size: Normal)   Pulse 80   Temp 98.3 F (36.8 C) (Oral)   Resp 16   Ht 5\' 4"  (1.626 m)   Wt 152 lb 6.4 oz (69.1 kg)   SpO2 100%   BMI 26.16 kg/m       Objective:   Physical Exam   General Appearance- Not in acute distress.    Chest and Lung Exam Auscultation: Breath sounds:-Normal. Clear even and unlabored. Adventitious sounds:- No Adventitious sounds.  Cardiovascular Auscultation:Rythm - Regular, rate and rythm. Heart Sounds -Normal heart sounds.  Abdomen Inspection:-Inspection Normal.  Palpation/Perucssion: Palpation and Percussion of the abdomen reveal- suprapubic Tender, No Rebound tenderness, No rigidity(Guarding) and No Palpable abdominal masses.  Liver:-Normal.  Spleen:- Normal.   Back Mid lumbar spine tenderness to palpation.(mostly bilateral si area pain on palpation) Pain on straight leg lift. Pain on lateral movements and flexion/extension of the spine.  Lower ext  neurologic  L5-S1 sensation intact bilaterally. Normal patellar reflexes bilaterally. No foot drop bilaterally.       Assessment & Plan:  For back pain can use tylenol 1 tab po every 6 hours. Will write tramadol to use sparingly. Rx advisement. Have family present on initial dosing to make sure does not oversedate her.  Will try to order mri of lumbar spine.  Notify us on Monday how you are doing?  If pain persists then will refer you back to spine specialist.   Red flag symptoms discussed that would indicate ED evaluation.  Follow up Monday or Tuesday. At least  call and update [place] if pain still persisting.  Some blood urine today. Will go ahead and get culture. When in for follow up with pcp or myself recommend repeat ua to see if blood persists.  [place], Percell Miller, PA-C

## 2016-12-01 ENCOUNTER — Telehealth: Payer: Self-pay | Admitting: Internal Medicine

## 2016-12-01 NOTE — Telephone Encounter (Signed)
Relation to pt: self Call back number: [phone] Pharmacy:  Reason for call:   Patient was last seen 11/28/16 for lower back pain and states medication prescribe traMADol (ULTRAM) 50 MG tablet causing her to feel nausea in need of clinical advice.    Patient states [facility] cant schedule MRI until Saturday 12/06/16, patient would like to be seen sooner,  please advise

## 2016-12-01 NOTE — Telephone Encounter (Signed)
Pamela Whitaker from [place], has contacted [employer] and sent order over, they will contact patient to schedule appt, pts daughter aware

## 2016-12-02 ENCOUNTER — Ambulatory Visit (INDEPENDENT_AMBULATORY_CARE_PROVIDER_SITE_OTHER): Payer: Medicare Other

## 2016-12-02 ENCOUNTER — Encounter: Payer: Self-pay | Admitting: Physician Assistant

## 2016-12-02 ENCOUNTER — Ambulatory Visit (INDEPENDENT_AMBULATORY_CARE_PROVIDER_SITE_OTHER): Payer: Medicare Other | Admitting: Physician Assistant

## 2016-12-02 VITALS — BP 130/80 | HR 78 | Temp 97.8°F | Ht 64.0 in | Wt 150.4 lb

## 2016-12-02 DIAGNOSIS — R350 Frequency of micturition: Secondary | ICD-10-CM

## 2016-12-02 DIAGNOSIS — M545 Low back pain, unspecified: Secondary | ICD-10-CM

## 2016-12-02 LAB — POCT URINALYSIS DIPSTICK
Bilirubin, UA: NEGATIVE
Blood, UA: 25
Glucose, UA: NEGATIVE
KETONES UA: NEGATIVE
LEUKOCYTES UA: NEGATIVE
Nitrite, UA: NEGATIVE
PH UA: 6 (ref 5.0–8.0)
PROTEIN UA: NEGATIVE
SPEC GRAV UA: 1.015 (ref 1.010–1.025)
UROBILINOGEN UA: 0.2 U/dL

## 2016-12-02 NOTE — Progress Notes (Signed)
Pamela Whitaker is a 83 y.o. female here for low back pain and adverse reaction to Tramadol and Fosamax.  I acted as a Education administrator for [employer], PA-C Anselmo Pickler, LPN  History of Present Illness:   Chief Complaint  Patient presents with  . Back Pain    Low   Patient was seen on July 6 by [name] PA-C, for acute midline low back pain. She reports that a urine and x-ray were ordered, however there is no record of this in the chart anywhere. I do see in the chart where in the plan Edward noted blood in urine, however I do not see any actual urine results. She was given tramadol for use of back pain and advised to follow up if symptoms worsen. Also it appears that an MRI of her lumbar spine was also ordered. I do not see any recent record of a lumbar x-ray. She called the doctor's office yesterday to discuss that the tramadol that was given to her caused her to have some nausea and she reports that she was never contacted.  Back Pain  This is a new problem. Episode onset: Started July 4th, x 2 weeks. The problem occurs constantly. The problem is unchanged. The pain is present in the lumbar spine. The quality of the pain is described as stabbing. The pain radiates to the left thigh and right thigh (when stands). The pain is at a severity of 6/10. The pain is moderate. The pain is the same all the time. The symptoms are aggravated by standing. Pertinent negatives include no fever, headaches, numbness, tingling or weight loss. Treatments tried: Tylenol. The treatment provided moderate relief.   Based on her history she reports that she has had some urinary issues going on for quite some time, including incontinence and frequency. She is on Myrbetriq for this. She denies any incontinence with bowel movements. She did have an episode where she thought she had some diarrhea on Sunday but further discussion sounds like some loose stools. Denies blood. She has also been on Fosamax for 4 weeks.  She says that she has been online and has been reading the side effects of this medication and notes that it can include back and joint pain. She is here to ask if this is causing her current back and joint pain. She takes her Fosamax every Tuesday and has not yet taken it today. She does not have any history of kidney stones.  She also has a history of seeing a spine specialist for her lumbar stenosis history. She reports that she last saw her spine specialist a week prior to developing this most recent onset of back pain. She says that she has not been in touch with her spine specialist since this new onset of back pain. An MRI was scheduled by prior provider and she reports that she is "on call" to have this done on Saturday, but she is asking if I can expedite this we can figure out what is wrong with her today. She reports that the pain occurs mostly when going from sitting to standing, and Tylenol treats the pain.  She is using a walker today. She is currently with a friend who has driven her.   Past Medical History:  Diagnosis Date  . Atrial fibrillation ([place])    treated with multaq x 6 mos in 2011....[name]=2 (Age; + CHF; no CVA; no DM2). Echo 05/24/10: EF 30% mild AS(mean 88mgHg); mild LAE; mod LAE; PASP 35 Systolic  CHF  . Breast cancer ([place])     in 1987 with a left  mastectomy  . Cardiomyopathy    a. tachy mediated Myoview 06/06/10:  EF 31%; no scar or ischemia; EF up to 50% per echo November 2013  . Diverticulosis   . DJD (degenerative joint disease) of knee    right knee  . Falls   . Hypercholesteremia   . Hypertension   . Lumbar stenosis   . Osteoporosis    history of bone chips in the right knee,previous right fibular fracture, operated on by Dr. Veverly Fells  . RLS (restless legs syndrome)   . Vitamin D deficiency      Social History   Social History  . Marital status: Married    Spouse name: Pamela Whitaker  . Number of children: 2  . Years of education: college   Occupational  History  . retired Retired   Social History Main Topics  . Smoking status: Never Smoker  . Smokeless tobacco: Never Used  . Alcohol use No  . Drug use: No  . Sexual activity: No   Other Topics Concern  . Not on file   Social History Narrative  . No narrative on file    Past Surgical History:  Procedure Laterality Date  . BREAST RECONSTRUCTION  1988   left breast  . CARDIOVERSION  2012  . KNEE ARTHROSCOPY  2005  . MASTECTOMY, RADICAL  1987   left breast  . WRIST FRACTURE SURGERY     left wrist    Family History  Problem Relation Age of Onset  . Heart attack Father   . Aortic aneurysm Mother   . Breast cancer Sister   . Diabetes Sister   . Stomach cancer Sister     Allergies  Allergen Reactions  . Amiodarone     Stopped on her own  . Lipitor [Atorvastatin]     weakness  . Multaq [Dronedarone]     Stopped on her own    Current Medications:   Current Outpatient Prescriptions:  .  acetaminophen (TYLENOL) 500 MG tablet, Take 500 mg by mouth every 6 (six) hours as needed (pt takes 1-2 tablets at night for pain)., Disp: , Rfl:  .  alendronate (FOSAMAX) 70 MG tablet, Take 1 tablet (70 mg total) by mouth every 7 (seven) days. Take with a full glass of water on an empty stomach., Disp: 4 tablet, Rfl: 11 .  carvedilol (COREG) 6.25 MG tablet, TAKE (12.5 MG) 2 TABLETS BY MOUTH IN AM AND TAKE (6.25 MG) 1 TABLET BY MOUTH IN PM, Disp: 90 tablet, Rfl: 10 .  Cholecalciferol (VITAMIN D) 2000 UNITS tablet, Take 2,000 Units by mouth daily. Does not take regularly, Disp: , Rfl:  .  mirabegron ER (MYRBETRIQ) 25 MG TB24 tablet, Take 1 tablet (25 mg total) by mouth daily., Disp: 30 tablet, Rfl: 6 .  Multiple Vitamin (MULTIVITAMIN) capsule, Take 1 capsule by mouth daily. Women's Centrum, Disp: , Rfl:  .  rivaroxaban (XARELTO) 20 MG TABS tablet, Take 1 tablet (20 mg total) by mouth daily with supper., Disp: 30 tablet, Rfl: 11 .  traMADol (ULTRAM) 50 MG tablet, Take 1 tablet (50 mg  total) by mouth every 8 (eight) hours as needed. (Patient not taking: Reported on 12/02/2016), Disp: 12 tablet, Rfl: 0   Review of Systems:   Review of Systems  Constitutional: Negative for chills, fever, malaise/fatigue and weight loss.  Genitourinary: Positive for frequency.       Frequency and incontinence  at baseline  Musculoskeletal: Positive for back pain.  Neurological: Negative for tingling, numbness and headaches.    Vitals:   Vitals:   12/02/16 1047  BP: 130/80  Pulse: 78  Temp: 97.8 F (36.6 C)  TempSrc: Oral  SpO2: 98%  Weight: 150 lb 6.1 oz (68.2 kg)  Height: _0  (1.626 m)     Body mass index is 25.81 kg/m.  Physical Exam:   Physical Exam  Constitutional: She appears well-developed. She is cooperative.  Non-toxic appearance. She does not have a sickly appearance. She does not appear ill. No distress.  Cardiovascular: Normal rate, S1 normal, S2 normal, normal heart sounds and normal pulses.  An irregularly irregular rhythm present.  No LE edema  Pulmonary/Chest: Effort normal and breath sounds normal.  Abdominal: There is no CVA tenderness.  Musculoskeletal:  Tenderness to bilateral paraspinal lumbar area. Unable to assess range of motion due to pain. No spinal tenderness, rashes, erythema.  Neurological: She is alert. She has normal strength. No cranial nerve deficit or sensory deficit. Coordination normal.  Psychiatric: She has a normal mood and affect. Her speech is normal and behavior is normal. Thought content normal.  Nursing note and vitals reviewed.   Results for orders placed or performed in visit on 12/02/16  POCT urinalysis dipstick  Result Value Ref Range   Color, UA Yellow    Clarity, UA Clear    Glucose, UA Negative    Bilirubin, UA Negative    Ketones, UA Negative    Spec Grav, UA 1.015 1.010 - 1.025   Blood, UA 25 Ery/uL    pH, UA 6.0 5.0 - 8.0   Protein, UA Negative    Urobilinogen, UA 0.2 0.2 or 1.0 E.U./dL   Nitrite, UA Negative     Leukocytes, UA Negative Negative     Assessment and Plan:    Bertie was seen today for back pain.  Diagnoses and all orders for this visit:  Acute bilateral low back pain without sciatica Patient presents with acute on chronic low back pain. She has a history of lumbar stenosis. I do not see any recent lumbar x-rays performed, and I have ordered this. I do not see a recent urinalysis and urine culture ordered and ordered this. I discussed with her stopping Fosamax for about 4 weeks or until pain has completely resolved to see if this helps with her pain, then she may resume the medicine and if her pain returns with Fosamax then she knows that Is the culprit. I advised her that she needs to keep her doctor in the loop with whether or not she is going to continue the Fosamax. I also gave HER-2 options in terms of the MRI. I advised her to either follow-up with the ordering provider or go to her spine specialist for further evaluation and treatment. If she develops any worsening back pain with neurological symptoms, or stool incontinence, I want her to be evaluated in the ER.  I recommended that she continue to manage her pain with Tylenol. -     Urine Culture -     POCT urinalysis dipstick -     DG Lumbar Spine 2-3 Views; Future   . Reviewed expectations re: course of current medical issues. . Discussed self-management of symptoms. . Outlined signs and symptoms indicating need for more acute intervention. . Patient verbalized understanding and all questions were answered. . See orders for this visit as documented in the electronic medical record. . Patient received an After-Visit  Summary.  CMA or LPN served as scribe during this visit. History, Physical, and Plan performed by medical provider. Documentation and orders reviewed and attested to.  Inda [place], PA-C

## 2016-12-02 NOTE — Patient Instructions (Addendum)
We will call you with urine results.  Take 1000 mg every 8 hours of Tylenol to help with pain.  Stop the Fosamax, wait 4 weeks (or longer until you are pain free), and resume -- if it causes pain please make an appointment with your doctor.  I would like for you to either: 1. Call [place] office and ask for Percell Miller (the [place] you saw) to discuss moving up your MRI. 2. Call your spine specialist to take over your care for your back while Dr. Sharlet Salina is out.  Follow-up if symptoms worsen.

## 2016-12-03 LAB — URINE CULTURE

## 2016-12-06 ENCOUNTER — Ambulatory Visit (HOSPITAL_BASED_OUTPATIENT_CLINIC_OR_DEPARTMENT_OTHER)
Admission: RE | Admit: 2016-12-06 | Discharge: 2016-12-06 | Disposition: A | Discharge status: Home / Self Care | Payer: Medicare Other | Source: Ambulatory Visit | Attending: Medical | Admitting: Medical

## 2016-12-06 DIAGNOSIS — M5136 Other intervertebral disc degeneration, lumbar region: Secondary | ICD-10-CM | POA: Insufficient documentation

## 2016-12-06 DIAGNOSIS — M5126 Other intervertebral disc displacement, lumbar region: Secondary | ICD-10-CM | POA: Diagnosis present

## 2016-12-06 DIAGNOSIS — M48061 Spinal stenosis, lumbar region without neurogenic claudication: Secondary | ICD-10-CM

## 2016-12-06 DIAGNOSIS — M5440 Lumbago with sciatica, unspecified side: Secondary | ICD-10-CM | POA: Diagnosis present

## 2016-12-06 DIAGNOSIS — M5410 Radiculopathy, site unspecified: Secondary | ICD-10-CM

## 2016-12-07 ENCOUNTER — Telehealth: Payer: Self-pay | Admitting: Medical

## 2016-12-07 NOTE — Telephone Encounter (Signed)
Pt has low back pain for 2 wks but history of back pain in the past. She recently saw spine specialist. She has some findings on recent mri that can account for the pain. Will you get name of back specialist she saw recently. Will refer back to the specialist.   Overall bulging disc on mri, progressive degenerative changes, moderate stenosis, and narrowing of areas where nerves exit spine.

## 2016-12-08 ENCOUNTER — Encounter: Payer: Self-pay | Admitting: Medical

## 2016-12-08 ENCOUNTER — Telehealth: Payer: Self-pay | Admitting: Medical

## 2016-12-08 DIAGNOSIS — M545 Low back pain: Secondary | ICD-10-CM

## 2016-12-08 NOTE — Telephone Encounter (Signed)
Called patient and discussed results. She stated understanding.  Stating that those were the same results she got from the x-ray of her back.  She said she has been taking Tylenol Extra Strength 1 tablet by mouth every 6 hours for her pain.  Stated she was unable to take the tramadol, because it made her sick on the stomach.  Current rate of pain 5/10.  Pt stated pain is better at rest.  Worse when she's bears weight on her legs.  She denied numbness and tingling in her legs.  She walks with a walker.  No falls.  She is experiencing right knee pain. Scheduled for an injection to right knee on 12/19/16.  She stated that she saw Dr. Rita Ohara before and had a good experience with him.  She won't mind seeing him again.  She also voiced some concerns about the pain possibly coming for her medications, specifically fosamax and xarelto.  She stated that when she read the side effects for those two medications, they both stated may cause severe back pain.  Pt would like to know provider's thoughts on those.  Please advise.

## 2016-12-08 NOTE — Telephone Encounter (Signed)
I don't thinks xarelto or fosamax causing her pain. This is a question for her pcp as I don't want to be changing pt chronic meds. That being said can you find out when Dr. Sharlet Salina is back in town. Is she on vacation?

## 2016-12-08 NOTE — Telephone Encounter (Signed)
Referral to Dr. Rita Ohara placed.

## 2016-12-09 NOTE — Telephone Encounter (Addendum)
Called patient and made her aware of referral to Dr. Rita Ohara and also that provider did not think that meds were causing pain.  She stated understanding and was very appreciative for the call back.  She stated that she's taking the Xarelto, but does not plan to take the Fosamax anymore.  She stated she remembers back pain being a side effect and doesn't want to take it anymore.  She was advised to speak to her PCP regarding her discussion to stop Fosamax.  She stated understanding and agreed.  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  Called office to verify how long Dr. Sharlet Salina would be out.  Was on hold for an extended period of time.  Will check back at a later time.

## 2016-12-13 ENCOUNTER — Other Ambulatory Visit: Payer: Self-pay | Admitting: Internal Medicine

## 2016-12-26 ENCOUNTER — Encounter: Payer: Self-pay | Admitting: Cardiovascular Disease

## 2016-12-26 ENCOUNTER — Ambulatory Visit: Payer: Medicare Other | Admitting: Cardiovascular Disease

## 2016-12-26 ENCOUNTER — Ambulatory Visit (INDEPENDENT_AMBULATORY_CARE_PROVIDER_SITE_OTHER): Payer: Medicare Other | Admitting: Cardiovascular Disease

## 2016-12-26 ENCOUNTER — Ambulatory Visit: Payer: Medicare Other | Admitting: Cardiology

## 2016-12-26 VITALS — BP 102/56 | HR 73 | Ht 64.0 in | Wt 153.0 lb

## 2016-12-26 DIAGNOSIS — I482 Chronic atrial fibrillation, unspecified: Secondary | ICD-10-CM

## 2016-12-26 NOTE — Patient Instructions (Signed)

## 2016-12-26 NOTE — Progress Notes (Signed)
Cardiology Office Note    Date:  12/26/2016   ID:  Pamela Whitaker, DOB 1933-04-09, MRN [id number]  PCP:  Hoyt Koch, MD  Cardiologist:  Dr. Johnsie Cancel  CC: 6 month follow up  History of Present Illness:  Pamela Whitaker is a 83 y.o. female with a history of chronic atrial fibrillation on Xarelto, HLD,  dementia, posterior tibial tendinitis, asthma, history of NICM (EF 30%--> 50-55%) and mod TR who presents to clinic for 6 month follow up.   Previously had a NICM with EF down to 35-40% by echo in 2012. Myoview done 05/2010 normal  EF improved to 50-55% with mild AS, mild MR, mod TR in 03/2012.   She has chronic afib with rate control strategy. She has failed Multaq and amiodarone in the past.   May 2017  changed to [place] due to it being difficult to get to INR appointments. She was changed to Xarelto 20mg  daily. She had only fallen once and used her cane as directed. She is unsteady due to a deformity of her foot.   She typically complains about her meds and has multiple somatic complaints   She has a strong family hx of CAD. Her father had very premature CAD with a heart attack 64s.   Myovue 03/10/16 Normal EF 62%   Lately primary issue has been back pain    Past Medical History:  Diagnosis Date  . Atrial fibrillation ([place])    treated with multaq x 6 mos in 2011....[name]=2 (Age; + CHF; no CVA; no DM2). Echo 05/24/10: EF 30% mild AS(mean 75mmgHg); mild LAE; mod LAE; PASP 35 Systolic CHF  . Breast cancer ([place])     in 1987 with a left  mastectomy  . Cardiomyopathy    a. tachy mediated Myoview 06/06/10:  EF 31%; no scar or ischemia; EF up to 50% per echo November 2013  . Diverticulosis   . DJD (degenerative joint disease) of knee    right knee  . Falls   . Hypercholesteremia   . Hypertension   . Lumbar stenosis   . Osteoporosis    history of bone chips in the right knee,previous right fibular fracture, operated on by Dr. Veverly Fells  . RLS (restless legs syndrome)   .  Vitamin D deficiency     Past Surgical History:  Procedure Laterality Date  . BREAST RECONSTRUCTION  1988   left breast  . CARDIOVERSION  2012  . KNEE ARTHROSCOPY  2005  . MASTECTOMY, RADICAL  1987   left breast  . WRIST FRACTURE SURGERY     left wrist    Current Medications: Outpatient Medications Prior to Visit  Medication Sig Dispense Refill  . acetaminophen (TYLENOL) 500 MG tablet Take 500 mg by mouth every 6 (six) hours as needed (pt takes 1-2 tablets at night for pain).    . carvedilol (COREG) 6.25 MG tablet TAKE (12.5 MG) 2 TABLETS BY MOUTH IN AM AND TAKE (6.25 MG) 1 TABLET BY MOUTH IN PM 90 tablet 10  . Cholecalciferol (VITAMIN D) 2000 UNITS tablet Take 2,000 Units by mouth daily. Does not take regularly    . Multiple Vitamin (MULTIVITAMIN) capsule Take 1 capsule by mouth daily. Women's Centrum    . MYRBETRIQ 25 MG TB24 tablet TAKE 1 TABLET BY MOUTH EVERY DAY 30 tablet 6  . rivaroxaban (XARELTO) 20 MG TABS tablet Take 1 tablet (20 mg total) by mouth daily with supper. 30 tablet 11  . alendronate (FOSAMAX) 70 MG tablet  Take 1 tablet (70 mg total) by mouth every 7 (seven) days. Take with a full glass of water on an empty stomach. (Patient not taking: Reported on 12/26/2016) 4 tablet 11  . traMADol (ULTRAM) 50 MG tablet Take 1 tablet (50 mg total) by mouth every 8 (eight) hours as needed. (Patient not taking: Reported on 12/26/2016) 12 tablet 0   No facility-administered medications prior to visit.      Allergies:   Amiodarone; Lipitor [atorvastatin]; and Multaq [dronedarone]   Social History   Social History  . Marital status: Married    Spouse name: Chrissie Noa  . Number of children: 2  . Years of education: college   Occupational History  . retired Retired   Social History Main Topics  . Smoking status: Never Smoker  . Smokeless tobacco: Never Used  . Alcohol use No  . Drug use: No  . Sexual activity: No   Other Topics Concern  . None   Social History Narrative    . None     Family History:  The patient's family history includes Aortic aneurysm in her mother; Breast cancer in her sister; Diabetes in her sister; Heart attack in her father; Stomach cancer in her sister.     ROS:   Please see the history of present illness.    ROS All other systems reviewed and are negative.   PHYSICAL EXAM:   VS:  BP (!) 102/56   Pulse 73   Ht 5\' 4"  (1.626 m)   Wt 153 lb (69.4 kg)   SpO2 99%   BMI 26.26 kg/m    GEN: Well nourished, well developed, in no acute distress  HEENT: normal  Neck: no JVD, carotid bruits, or masses Cardiac: irreg irreg; no murmurs, rubs, or gallops,no edema  Respiratory:  clear to auscultation bilaterally, normal work of breathing GI: soft, nontender, nondistended, + BS MS: no deformity or atrophy  Skin: warm and dry, no rash Neuro:  Alert and Oriented x 3, Strength and sensation are intact Psych: euthymic mood, full affect  Wt Readings from Last 3 Encounters:  12/26/16 153 lb (69.4 kg)  12/02/16 150 lb 6.1 oz (68.2 kg)  11/28/16 152 lb 6.4 oz (69.1 kg)      Studies/Labs Reviewed:   EKG:  EKG is ordered today.  The ekg ordered today demonstrates atrial fibrillation with freq PVCs. HR 89  Recent Labs: 02/26/2016: TSH 1.83 07/23/2016: ALT 20 11/04/2016: BUN 10; Creatinine, Ser 0.70; Hemoglobin 13.1; Platelets 131; Potassium 4.3; Sodium 142   Lipid Panel    Component Value Date/Time   CHOL 211 (H) 10/29/2016 1014   TRIG 355.0 (H) 10/29/2016 1014   HDL 45.50 10/29/2016 1014   CHOLHDL 5 10/29/2016 1014   VLDL 71.0 (H) 10/29/2016 1014   LDLCALC 171 (H) 03/20/2015 1351   LDLDIRECT 100.0 10/29/2016 1014    Additional studies/ records that were reviewed today include:  2D ECHO: 04/21/2012 LV EF: 50% -  55% Study Conclusion - Left ventricle: The cavity size was normal. Wall thickness was normal. Systolic function was normal. The estimated ejection fraction was in the range of 50% to 55%. Wall motion was normal;  there were no regional wall motion abnormalities. - Aortic valve: There was very mild stenosis. Mild regurgitation. - Mitral valve: Mild regurgitation. - Atrial septum: No defect or patent foramen ovale was identified. - Tricuspid valve: Moderate regurgitation.   ASSESSMENT & PLAN:   Chronic atrial fibrillation: continue Xarelto 20mg  daily. CHADSVASC score at least 5 (  CHF, HTN, age, F sex).  Rate well controlled on Coreg 12.5mg  BID.  She attributes many different sx to Xarelto (which I think are unrelated).   Fatigue: labs and TSH ok f/u pirmary   DOE: functional EF normal by myovue   HTN: BP well controlled on current regimen   HLD: lipids followed by PCP. TC 265, TG 233 and LDL 177. PCP recommended that she start statin therapy. She refuses statin therapy.   Hx of NICM: EF improved back to low normal in 2013. Appears euvolemic. EF 62% myovue 03/10/16  Back Pain: referred to Dr Sherwood Gambler by primary she stopped fosamax as she thinks it made pain worse   Jenkins Rouge

## 2017-02-12 ENCOUNTER — Other Ambulatory Visit: Payer: Self-pay | Admitting: Cardiovascular Disease

## 2017-03-02 ENCOUNTER — Encounter: Payer: Self-pay | Admitting: Internal Medicine

## 2017-03-02 ENCOUNTER — Ambulatory Visit (INDEPENDENT_AMBULATORY_CARE_PROVIDER_SITE_OTHER): Payer: Medicare Other | Admitting: Internal Medicine

## 2017-03-02 VITALS — BP 138/82 | HR 63 | Temp 97.7°F | Ht 64.0 in | Wt 153.0 lb

## 2017-03-02 DIAGNOSIS — M159 Polyosteoarthritis, unspecified: Secondary | ICD-10-CM

## 2017-03-02 DIAGNOSIS — N393 Stress incontinence (female) (male): Secondary | ICD-10-CM

## 2017-03-02 DIAGNOSIS — R2689 Other abnormalities of gait and mobility: Secondary | ICD-10-CM

## 2017-03-02 DIAGNOSIS — M810 Age-related osteoporosis without current pathological fracture: Secondary | ICD-10-CM

## 2017-03-02 MED ORDER — ALENDRONATE SODIUM 70 MG PO TABS: 70.0000 mg | ORAL_TABLET | ORAL | 11 refills | Status: DC

## 2017-03-02 NOTE — Progress Notes (Signed)
   Subjective:    Patient ID: Pamela Whitaker, female    DOB: 06-27-1932, 83 y.o.   MRN: [id number]  HPI The patient is an 83 YO female coming in for back problems. Having some pain off and on for about 2 months now. Seen several times by other provider at our office and given tylenol. Had x-ray, MRI, urine culture without changes. MRI with some chronic changes including nerve impingement in L4-S1 range. She is having soreness now in her back in the SI region. Some pain with walking but she is trying to walk more again and back in water aerobics. She denies numbness, weakness, bowel or bladder dysfunction.  She has stopped taking fosamax as she was concerned it caused her back problems. She does have osteoporosis and is very worried about her bone health. She is not taking calcium or vitamin d right now due to being out. Does not do weight bearing exercise (water aerobics).  She also wants follow up of her bladder problems (started myrbetriq at last visit, this has helped tremendously and she is only getting up once per night to urinate, no side effects, she is very happy with that).   Review of Systems  Constitutional: Negative.   HENT: Negative.   Eyes: Negative.   Respiratory: Negative for cough, chest tightness and shortness of breath.   Cardiovascular: Negative for chest pain, palpitations and leg swelling.  Gastrointestinal: Negative for abdominal distention, abdominal pain, constipation, diarrhea, nausea and vomiting.  Musculoskeletal: Positive for arthralgias and back pain.  Skin: Negative.   Neurological: Negative.   Psychiatric/Behavioral: Negative.       Objective:   Physical Exam  Constitutional: She is oriented to person, place, and time. She appears well-developed and well-nourished.  HENT:  Head: Normocephalic and atraumatic.  Eyes: EOM are normal.  Neck: Normal range of motion.  Cardiovascular: Normal rate and regular rhythm.  Exam reveals no friction rub.     Pulmonary/Chest: Effort normal and breath sounds normal. No respiratory distress. She has no wheezes. She has no rales.  Abdominal: Soft. Bowel sounds are normal. She exhibits no distension. There is no tenderness. There is no rebound.  Musculoskeletal: She exhibits no edema.  No tenderness in the spine or SI region to palpation and no radiation.   Neurological: She is alert and oriented to person, place, and time. Coordination normal.  Skin: Skin is warm and dry.  Psychiatric: She has a normal mood and affect.   Vitals:   03/02/17 1120  BP: 138/82  Pulse: 63  Temp: 97.7 F (36.5 C)  TempSrc: Oral  SpO2: 100%  Weight: 153 lb (69.4 kg)  Height: 5\' 4"  (1.626 m)      Assessment & Plan:  Flu shot given at visit

## 2017-03-02 NOTE — Patient Instructions (Signed)
Turmeric is something that can help with arthritis.  You should be taking calcium and vitamin D for the bone strength.   We have sent in the fosamax to start taking again for the bone strength.

## 2017-03-02 NOTE — Assessment & Plan Note (Signed)
Rx for myrbetriq is effective and will continue.

## 2017-03-02 NOTE — Assessment & Plan Note (Signed)
Likely causing the pain in her back. She is using tylenol successfully. We discussed the findings in her MRI spine and she understands now why she is hurting. Refer to PT for gait training and back strengthening.

## 2017-03-02 NOTE — Assessment & Plan Note (Signed)
Needs to resume fosamax and convinced her that it was not related to her back pain episode. Rx sent in to her pharmacy. She agrees to resume as well as resuming calcium and vitamin D. She does not feel able to do weight bearing exercise.

## 2017-03-23 ENCOUNTER — Ambulatory Visit: Payer: Medicare Other | Admitting: Cardiovascular Disease

## 2017-05-02 ENCOUNTER — Other Ambulatory Visit: Payer: Self-pay | Admitting: Cardiovascular Disease

## 2017-06-01 ENCOUNTER — Ambulatory Visit: Payer: Medicare Other | Admitting: *Deleted

## 2017-06-01 DIAGNOSIS — I482 Chronic atrial fibrillation, unspecified: Secondary | ICD-10-CM

## 2017-06-01 LAB — BASIC METABOLIC PANEL
BUN / CREAT RATIO: 13 (ref 12–28)
BUN: 11 mg/dL (ref 8–27)
CO2: 24 mmol/L (ref 20–29)
Calcium: 9.6 mg/dL (ref 8.7–10.3)
Chloride: 105 mmol/L (ref 96–106)
Creatinine, Ser: 0.86 mg/dL (ref 0.57–1.00)
GFR calc Af Amer: 73 mL/min/{1.73_m2} (ref >59)
GFR, EST NON AFRICAN AMERICAN: 63 mL/min/{1.73_m2} (ref >59)
GLUCOSE: 97 mg/dL (ref 65–99)
Potassium: 4.4 mmol/L (ref 3.5–5.2)
SODIUM: 142 mmol/L (ref 134–144)

## 2017-06-01 LAB — CBC
Hematocrit: 38.2 % (ref 34.0–46.6)
Hemoglobin: 13.3 g/dL (ref 11.1–15.9)
MCH: 30.3 pg (ref 26.6–33.0)
MCHC: 34.8 g/dL (ref 31.5–35.7)
MCV: 87 fL (ref 79–97)
PLATELETS: 157 10*3/uL (ref 150–379)
RBC: 4.39 x10E6/uL (ref 3.77–5.28)
RDW: 12.7 % (ref 12.3–15.4)
WBC: 7.6 10*3/uL (ref 3.4–10.8)

## 2017-06-01 NOTE — Progress Notes (Signed)
Pt was started on Xarelto 20mg  for AFib on 09/28/2015.    Reviewed patients medication list.  Pt is not  currently on any combined P-gp and strong CYP3A4 inhibitors/inducers (ketoconazole, traconazole, ritonavir, carbamazepine, phenytoin, rifampin, St. John's wort).  Reviewed labs: SCr 0.86, Weight 152.4lbs, Age 84 yrs old, CrCl- 73mL/min.  Dose  appropriate based on CrCl.   Hgb and HCT 13.3/38.2.   A full discussion of the nature of anticoagulants has been carried out.  A benefit/risk analysis has been presented to the patient, so that they understand the justification for choosing anticoagulation with Xarelto at this time.  The need for compliance is stressed.  Pt is aware to take the medication once daily with the largest meal of the day.  Side effects of potential bleeding are discussed, including unusual colored urine or stools, coughing up blood or coffee ground emesis, nose bleeds or serious fall or head trauma.  Discussed signs and symptoms of stroke. The patient should avoid any OTC items containing aspirin or ibuprofen.  Avoid alcohol consumption.   Call if any signs of abnormal bleeding.  Discussed financial obligations and resolved any difficulty in obtaining medication.  Next lab test in 6 months.  06/01/17-4:46pm-  Spoke with pt and reviewed labs and instructed her to continue taking Xarelto 20mg  QD with supper (largest meal of day) and to call us with any bleeding concerns and questions before your 8mth follow up.

## 2017-06-09 ENCOUNTER — Ambulatory Visit: Payer: Medicare Other | Admitting: Podiatry

## 2017-06-09 ENCOUNTER — Encounter: Payer: Self-pay | Admitting: Podiatry

## 2017-06-09 DIAGNOSIS — M76829 Posterior tibial tendinitis, unspecified leg: Secondary | ICD-10-CM | POA: Diagnosis not present

## 2017-06-09 NOTE — Progress Notes (Signed)
She presents today for follow-up of her posterior tibial tendinitis of the right foot states that I think I need a shot.  States that it has been causing her some pain for quite some time.  Objective: Vital signs are stable she is alert and oriented x3 she has pain on palpation to the distal most aspect of the posterior tibial tendon at the level of the navicular tuberosity and just slightly there distal at the level of the first metatarsal medially it cuneiform joint.  Assessment: Capsulitis posterior tibial tendinitis.  Plan: I injected the area today after sterile Betadine skin prep and verbal consent with 20 mg of Kenalog 5 mg of Marcaine to the point of maximal tenderness.  Tolerated procedure well without complication follow-up with me as needed.

## 2017-06-15 ENCOUNTER — Other Ambulatory Visit: Payer: Self-pay | Admitting: Cardiovascular Disease

## 2017-07-14 NOTE — Progress Notes (Signed)
Cardiology Office Note    Date:  07/23/2017   ID:  Pamela Whitaker, DOB 1932-08-12, MRN [id number]  PCP:  Hoyt Koch, MD  Cardiologist:  Dr. Johnsie Cancel  CC: 6 month follow up  History of Present Illness:   84 y.o. chronic afib CHA2VASC 5 on xarelto. Failed Multaq and amiodarone rate control strategy adoopted  History of NIDCM improved last EF by myovue 03/09/16 62%. Likes to complain About medication and find reasons not to take them with myriad of somatic complaints. Some dementia contributes to problem  No palpitations, dyspnea or chest pain Seems to be compliant with meds    Past Medical History:  Diagnosis Date  . Atrial fibrillation ([place])    treated with multaq x 6 mos in 2011....[name]=2 (Age; + CHF; no CVA; no DM2). Echo 05/24/10: EF 30% mild AS(mean 25mgHg); mild LAE; mod LAE; PASP 35 Systolic CHF  . [place]     in 1987 with a left  mastectomy  . Cardiomyopathy    a. tachy mediated Myoview 06/06/10:  EF 31%; no scar or ischemia; EF up to 50% per echo November 2013  . Diverticulosis   . DJD (degenerative joint disease) of knee    right knee  . Falls   . Hypercholesteremia   . Hypertension   . Lumbar stenosis   . Osteoporosis    history of bone chips in the right knee,previous right fibular fracture, operated on by Dr. NVeverly Fells . RLS (restless legs syndrome)   . Vitamin D deficiency     Past Surgical History:  Procedure Laterality Date  . BREAST RECONSTRUCTION  1988   left breast  . CARDIOVERSION  2012  . KNEE ARTHROSCOPY  2005  . MASTECTOMY, RADICAL  1987   left breast  . WRIST FRACTURE SURGERY     left wrist    Current Medications: Outpatient Medications Prior to Visit  Medication Sig Dispense Refill  . acetaminophen (TYLENOL) 500 MG tablet Take 500 mg by mouth every 6 (six) hours as needed (pt takes 1-2 tablets at night for pain).    .Marland Kitchenalendronate (FOSAMAX) 70 MG tablet Take 1 tablet (70 mg total) by mouth every 7 (seven) days. Take  with a full glass of water on an empty stomach. 4 tablet 11  . carvedilol (COREG) 6.25 MG tablet TAKE 2 TABLETS BY MOUTH EVERY MORNING AND 1 TABLET EVERY EVENING 90 tablet 7  . Cholecalciferol (VITAMIN D) 2000 UNITS tablet Take 2,000 Units by mouth daily. Does not take regularly    . Multiple Vitamin (MULTIVITAMIN) capsule Take 1 capsule by mouth daily. Women's Centrum    . MYRBETRIQ 25 MG TB24 tablet TAKE 1 TABLET BY MOUTH EVERY DAY 30 tablet 6  . XARELTO 20 MG TABS tablet TAKE 1 TABLET (20 MG TOTAL) BY MOUTH DAILY WITH SUPPER. 30 tablet 10   No facility-administered medications prior to visit.      Allergies:   Amiodarone; Lipitor [atorvastatin]; and Multaq [dronedarone]   Social History   Socioeconomic History  . Marital status: Married    Spouse name: kChrissie Noa . Number of children: 2  . Years of education: college  . Highest education level: None  Social Needs  . Financial resource strain: None  . Food insecurity - worry: None  . Food insecurity - inability: None  . Transportation needs - medical: None  . Transportation needs - non-medical: None  Occupational History  . Occupation: retired    EF[employer] farm manager RETIRED  Tobacco Use  . Smoking status: Never Smoker  . Smokeless tobacco: Never Used  Substance and Sexual Activity  . Alcohol use: No  . Drug use: No  . Sexual activity: No  Other Topics Concern  . None  Social History Narrative  . None     Family History:  The patient's family history includes Aortic aneurysm in her mother; Breast cancer in her sister; Diabetes in her sister; Heart attack in her father; Stomach cancer in her sister.     ROS:   Please see the history of present illness.    ROS All other systems reviewed and are negative.   PHYSICAL EXAM:   VS:  BP 112/70   Pulse 88   Ht 5' 4.75" (1.645 m)   Wt 150 lb (68 kg)   BMI 25.15 kg/m    Affect appropriate Healthy:  appears stated age 35: normal Neck supple with no adenopathy JVP normal no  bruits no thyromegaly Lungs clear with no wheezing and good diaphragmatic motion Heart:  S1/S2 no murmur, no rub, gallop or click PMI normal Abdomen: benighn, BS positve, no tenderness, no AAA no bruit.  No HSM or HJR Distal pulses intact with no bruits No edema Neuro non-focal Skin warm and dry No muscular weakness   Wt Readings from Last 3 Encounters:  07/23/17 150 lb (68 kg)  03/02/17 153 lb (69.4 kg)  12/26/16 153 lb (69.4 kg)      Studies/Labs Reviewed:   EKG:  07/23/17  atrial fibrillation . HR 88  Recent Labs: 07/23/2016: ALT 20 06/01/2017: BUN 11; Creatinine, Ser 0.86; Hemoglobin 13.3; Platelets 157; Potassium 4.4; Sodium 142   Lipid Panel    Component Value Date/Time   CHOL 211 (H) 10/29/2016 1014   TRIG 355.0 (H) 10/29/2016 1014   HDL 45.50 10/29/2016 1014   CHOLHDL 5 10/29/2016 1014   VLDL 71.0 (H) 10/29/2016 1014   LDLCALC 171 (H) 03/20/2015 1351   LDLDIRECT 100.0 10/29/2016 1014    Additional studies/ records that were reviewed today include:  2D ECHO: 04/21/2012 LV EF: 50% -  55% Study Conclusion - Left ventricle: The cavity size was normal. Wall thickness was normal. Systolic function was normal. The estimated ejection fraction was in the range of 50% to 55%. Wall motion was normal; there were no regional wall motion abnormalities. - Aortic valve: There was very mild stenosis. Mild regurgitation. - Mitral valve: Mild regurgitation. - Atrial septum: No defect or patent foramen ovale was identified. - Tricuspid valve: Moderate regurgitation.   ASSESSMENT & PLAN:   Chronic atrial fibrillation: continue Xarelto 60m daily. CHADSVASC score at least 5 (CHF, HTN, age, F sex).  Rate well controlled on Coreg 12.569mBID.  She attributes many different sx to Xarelto (which I think are unrelated).   Fatigue: labs and TSH ok f/u pirmary   DOE: functional EF normal by myovue   HTN: BP well controlled on current regimen   HLD: lipids  followed by PCP. TC 265, TG 233 and LDL 177. PCP recommended that she start statin therapy. She refuses statin therapy.   Hx of NICM: EF improved back to low normal in 2013. Appears euvolemic. EF 62% myovue 03/10/16  Back Pain: referred to Dr NuSherwood Gamblery primary she stopped fosamax as she thinks it made pain worse   PeJenkins Rouge

## 2017-07-23 ENCOUNTER — Encounter: Payer: Self-pay | Admitting: Cardiovascular Disease

## 2017-07-23 ENCOUNTER — Ambulatory Visit: Payer: Medicare Other | Admitting: Cardiovascular Disease

## 2017-07-23 VITALS — BP 112/70 | HR 88 | Ht 64.75 in | Wt 150.0 lb

## 2017-07-23 DIAGNOSIS — R5383 Other fatigue: Secondary | ICD-10-CM

## 2017-07-23 DIAGNOSIS — R0609 Other forms of dyspnea: Secondary | ICD-10-CM | POA: Diagnosis not present

## 2017-07-23 DIAGNOSIS — I10 Essential (primary) hypertension: Secondary | ICD-10-CM

## 2017-07-23 DIAGNOSIS — E785 Hyperlipidemia, unspecified: Secondary | ICD-10-CM

## 2017-07-23 DIAGNOSIS — I482 Chronic atrial fibrillation, unspecified: Secondary | ICD-10-CM

## 2017-07-23 NOTE — Patient Instructions (Signed)

## 2017-07-24 ENCOUNTER — Inpatient Hospital Stay (HOSPITAL_COMMUNITY): Payer: Medicare Other | Admitting: Internal Medicine

## 2017-07-24 ENCOUNTER — Encounter (HOSPITAL_COMMUNITY): Payer: Self-pay | Admitting: Internal Medicine

## 2017-07-24 VITALS — BP 130/77 | HR 76 | Temp 97.6°F | Resp 18 | Wt 155.0 lb

## 2017-08-10 ENCOUNTER — Other Ambulatory Visit: Payer: Self-pay | Admitting: Family

## 2017-08-19 ENCOUNTER — Other Ambulatory Visit (HOSPITAL_COMMUNITY): Payer: Medicare Other

## 2017-08-19 ENCOUNTER — Ambulatory Visit (HOSPITAL_COMMUNITY): Payer: Medicare Other

## 2017-08-20 ENCOUNTER — Other Ambulatory Visit (HOSPITAL_COMMUNITY): Payer: Medicare Other

## 2017-08-27 ENCOUNTER — Ambulatory Visit (HOSPITAL_COMMUNITY): Payer: Medicare Other

## 2017-08-27 ENCOUNTER — Ambulatory Visit (HOSPITAL_COMMUNITY)
Admission: RE | Admit: 2017-08-27 | Discharge: 2017-08-27 | Disposition: A | Discharge status: Home / Self Care | Payer: Medicare Other | Source: Ambulatory Visit | Attending: Internal Medicine | Admitting: Internal Medicine

## 2017-08-27 ENCOUNTER — Other Ambulatory Visit: Payer: Self-pay | Admitting: Family

## 2017-08-27 ENCOUNTER — Other Ambulatory Visit (HOSPITAL_COMMUNITY): Payer: Self-pay | Admitting: Internal Medicine

## 2017-08-27 DIAGNOSIS — Z1231 Encounter for screening mammogram for malignant neoplasm of breast: Secondary | ICD-10-CM | POA: Diagnosis present

## 2017-08-27 DIAGNOSIS — M810 Age-related osteoporosis without current pathological fracture: Secondary | ICD-10-CM | POA: Insufficient documentation

## 2017-08-27 DIAGNOSIS — Z853 Personal history of malignant neoplasm of breast: Secondary | ICD-10-CM

## 2017-08-28 ENCOUNTER — Ambulatory Visit (HOSPITAL_COMMUNITY): Payer: Medicare Other | Admitting: Hematology

## 2017-09-01 ENCOUNTER — Ambulatory Visit (HOSPITAL_COMMUNITY): Payer: Medicare Other | Admitting: Hematology

## 2017-09-02 ENCOUNTER — Encounter (HOSPITAL_COMMUNITY): Payer: Self-pay | Admitting: Hematology

## 2017-09-02 ENCOUNTER — Inpatient Hospital Stay (HOSPITAL_COMMUNITY): Payer: Medicare Other | Attending: Hematology | Admitting: Hematology

## 2017-09-02 ENCOUNTER — Other Ambulatory Visit: Payer: Self-pay

## 2017-09-02 VITALS — BP 122/68 | HR 97 | Temp 98.0°F | Resp 20 | Wt 157.5 lb

## 2017-09-02 DIAGNOSIS — M810 Age-related osteoporosis without current pathological fracture: Secondary | ICD-10-CM | POA: Insufficient documentation

## 2017-09-02 DIAGNOSIS — Z853 Personal history of malignant neoplasm of breast: Secondary | ICD-10-CM | POA: Diagnosis present

## 2017-09-02 NOTE — Patient Instructions (Signed)
[place] at [facility] Discharge Instructions  Pt may start Prolia every 6 months if approved by her dentist. Pt to call back after speaking to her dentist then we will schedule Prolia injections per MD. F/U with MD in 1 year   Thank you for choosing [place] at [facility] to provide your oncology and hematology care.  To afford each patient quality time with our provider, please arrive at least 15 minutes before your scheduled appointment time.   If you have a lab appointment with the [facility] please come in thru the  Main Entrance and check in at the main information desk  You need to re-schedule your appointment should you arrive 10 or more minutes late.  We strive to give you quality time with our providers, and arriving late affects you and other patients whose appointments are after yours.  Also, if you no show three or more times for appointments you may be dismissed from the clinic at the providers discretion.     Again, thank you for choosing [facility].  Our hope is that these requests will decrease the amount of time that you wait before being seen by our physicians.       _____________________________________________________________  Should you have questions after your visit to [facility], please contact our office at [phone] between the hours of 8:30 a.m. and 4:30 p.m.  Voicemails left after 4:30 p.m. will not be returned until the following business day.  For prescription refill requests, have your pharmacy contact our office.       Resources For Cancer Patients and their Caregivers ? American Cancer Society: Can assist with transportation, wigs, general needs, runs Look Good Feel Better.        ([phone] 431-2557 ? Cancer Care: Provides financial assistance, online support groups, medication/co-pay assistance.  1-800-813-HOPE 848-596-8151) ? [facility] Assists  [place] cancer patients and their families through emotional , educational and financial support.  313-496-2047 ? [place] DSS Where to apply for food stamps, Medicaid and utility assistance. 773-754-0335 ? [facility]: Transportation to medical appointments. 432-307-7463 ? Social Security Administration: May apply for disability if have a Stage IV cancer. 419-219-8268 343-523-7974 ? [employer], Disability and Transit Services: Assists with nutrition, care and transit needs. [place] Support Programs:   > Cancer Support Group  2nd Tuesday of the month 1pm-2pm, Journey Room   > Creative Journey  3rd Tuesday of the month 1130am-1pm, Journey Room

## 2017-09-02 NOTE — Progress Notes (Signed)
[place], [place] [address]   CLINIC:  Medical Oncology/Hematology  PCP:  Hoyt Koch, MD [place] [address] [phone]   REASON FOR VISIT:  Follow-up for left breast cancer.   INTERVAL HISTORY:  Ms. Fischl 84 y.o. female returns for routine follow-up of her breast cancer and osteoporosis.  She lives by herself.  She walks with the help of walker and cane.  She denies any new onset pains.  She denies any falls.  No recent hospitalizations or infections were noted.    REVIEW OF SYSTEMS:  Review of Systems  Constitutional: Positive for fatigue.  HENT:  Negative.   Eyes: Negative.   Respiratory: Negative.   Cardiovascular: Negative.   Gastrointestinal: Negative.   Genitourinary: Negative.    Skin: Negative.   Hematological: Negative.   Psychiatric/Behavioral: Negative.      PAST MEDICAL/SURGICAL HISTORY:  Past Medical History:  Diagnosis Date  . Atrial fibrillation ([place])    treated with multaq x 6 mos in 2011....[name]=2 (Age; + CHF; no CVA; no DM2). Echo 05/24/10: EF 30% mild AS(mean 83mmgHg); mild LAE; mod LAE; PASP 35 Systolic CHF  . Breast cancer ([place])     in 1987 with a left  mastectomy  . Cardiomyopathy    a. tachy mediated Myoview 06/06/10:  EF 31%; no scar or ischemia; EF up to 50% per echo November 2013  . Diverticulosis   . DJD (degenerative joint disease) of knee    right knee  . Falls   . Hypercholesteremia   . Hypertension   . Lumbar stenosis   . Osteoporosis    history of bone chips in the right knee,previous right fibular fracture, operated on by Dr. Veverly Fells  . RLS (restless legs syndrome)   . Vitamin D deficiency    Past Surgical History:  Procedure Laterality Date  . BREAST RECONSTRUCTION  1988   left breast  . CARDIOVERSION  2012  . KNEE ARTHROSCOPY  2005  . MASTECTOMY, RADICAL  1987   left breast  . WRIST FRACTURE SURGERY     left wrist     SOCIAL HISTORY:    Social History   Socioeconomic History  . Marital status: Married    Spouse name: Chrissie Noa  . Number of children: 2  . Years of education: college  . Highest education level: Not on file  Occupational History  . Occupation: retired    Fish farm manager: RETIRED  Social Needs  . Financial resource strain: Not on file  . Food insecurity:    Worry: Not on file    Inability: Not on file  . Transportation needs:    Medical: Not on file    Non-medical: Not on file  Tobacco Use  . Smoking status: Never Smoker  . Smokeless tobacco: Never Used  Substance and Sexual Activity  . Alcohol use: No  . Drug use: No  . Sexual activity: Never  Lifestyle  . Physical activity:    Days per week: Not on file    Minutes per session: Not on file  . Stress: Not on file  Relationships  . Social connections:    Talks on phone: Not on file    Gets together: Not on file    Attends religious service: Not on file    Active member of club or organization: Not on file    Attends meetings of clubs or organizations: Not on file    Relationship status: Not on file  .  Intimate partner violence:    Fear of current or ex partner: Not on file    Emotionally abused: Not on file    Physically abused: Not on file    Forced sexual activity: Not on file  Other Topics Concern  . Not on file  Social History Narrative  . Not on file    FAMILY HISTORY:  Family History  Problem Relation Age of Onset  . Heart attack Father   . Aortic aneurysm Mother   . Breast cancer Sister   . Diabetes Sister   . Stomach cancer Sister     CURRENT MEDICATIONS:  Outpatient Encounter Medications as of 09/02/2017  Medication Sig  . acetaminophen (TYLENOL) 500 MG tablet Take 500 mg by mouth every 6 (six) hours as needed (pt takes 1-2 tablets at night for pain).  Marland Kitchen alendronate (FOSAMAX) 70 MG tablet Take 1 tablet (70 mg total) by mouth every 7 (seven) days. Take with a full glass of water on an empty stomach.  . carvedilol (COREG)  6.25 MG tablet TAKE 2 TABLETS BY MOUTH EVERY MORNING AND 1 TABLET EVERY EVENING  . Cholecalciferol (VITAMIN D) 2000 UNITS tablet Take 2,000 Units by mouth daily. Does not take regularly  . Multiple Vitamin (MULTIVITAMIN) capsule Take 1 capsule by mouth daily. Women's Centrum  . MYRBETRIQ 25 MG TB24 tablet TAKE 1 TABLET BY MOUTH EVERY DAY  . [DISCONTINUED] Cholecalciferol (VITAMIN D3) 1000 UNIT/SPRAY LIQD Vitamin D3  . [DISCONTINUED] XARELTO 20 MG TABS tablet TAKE 1 TABLET (20 MG TOTAL) BY MOUTH DAILY WITH SUPPER.   No facility-administered encounter medications on file as of 09/02/2017.     ALLERGIES:  Allergies  Allergen Reactions  . Amiodarone     Stopped on her own  . Lipitor [Atorvastatin]     weakness  . Multaq [Dronedarone]     Stopped on her own     PHYSICAL EXAM:  ECOG Performance status: 1 Vitals:   09/02/17 1338 09/02/17 1348  BP: 122/68 122/68  Pulse: 97 97  Resp: 20 20  Temp: 98 F (36.7 C) 98 F (36.7 C)  SpO2: 94% 94%   Filed Weights   09/02/17 1338 09/02/17 1348  Weight: 157 lb 12.8 oz (71.6 kg) 157 lb 8 oz (71.4 kg)    Physical Exam Right breast examination did not reveal any masses.  No palpable adenopathy in the neck and in the axilla.  LABORATORY DATA:  I have reviewed the labs as listed.  CBC    Component Value Date/Time   WBC 7.6 06/01/2017 1151   WBC 7.8 07/23/2016 1357   RBC 4.39 06/01/2017 1151   RBC 4.09 07/23/2016 1357   HGB 13.3 06/01/2017 1151   HCT 38.2 06/01/2017 1151   PLT 157 06/01/2017 1151   MCV 87 06/01/2017 1151   MCH 30.3 06/01/2017 1151   MCH 30.8 07/23/2016 1357   MCHC 34.8 06/01/2017 1151   MCHC 33.2 07/23/2016 1357   RDW 12.7 06/01/2017 1151   LYMPHSABS 1.6 11/04/2016 1012   MONOABS 0.6 07/23/2016 1357   EOSABS 0.1 11/04/2016 1012   BASOSABS 0.1 11/04/2016 1012   CMP Latest Ref Rng & Units 06/01/2017 11/04/2016 07/23/2016  Glucose 65 - 99 mg/dL 97 80 95  BUN 8 - 27 mg/dL 11 10 10   Creatinine 0.57 - 1.00 mg/dL 0.86  0.70 0.71  Sodium 134 - 144 mmol/L 142 142 139  Potassium 3.5 - 5.2 mmol/L 4.4 4.3 4.1  Chloride 96 - 106 mmol/L 105 103 105  CO2 20 - 29 mmol/L 24 24 28   Calcium 8.7 - 10.3 mg/dL 9.6 9.6 9.5  Total Protein 6.5 - 8.1 g/dL - - 7.2  Total Bilirubin 0.3 - 1.2 mg/dL - - 0.7  Alkaline Phos 38 - 126 U/L - - 59  AST 15 - 41 U/L - - 21  ALT 14 - 54 U/L - - 20       DIAGNOSTIC IMAGING:  I have independently reviewed her mammogram dated 08/27/2017 which was BI-RADS Category 1 of the right breast.     ASSESSMENT & PLAN:   Osteoporosis 1.  Left breast cancer: She had a stage III breast adenocarcinoma, left mastectomy with reconstruction done in 1987.  It was 6.5 cm tumor with 2 out of 27 nodes positive.  She underwent chemotherapy with CAF VP.  She took antiestrogen therapy for 12 months.  She could not tolerate tamoxifen.  Her physical exam did not reveal any abnormalities.  A mammogram of the right breast on 08/27/2017 was BI-RADS Category 1.  I have reordered mammogram in 1 year.  She will come back in 1 year for follow-up.  2.  Osteoporosis: Her most recent DEXA scan on 08/27/2017 shows T score of -2.7 in the femur.  There is no significant change from last year.  We talked about initiating her on Prolia once every 6 months.  But I have reviewed her medical record and saw that she is already on Fosamax.  Hence we will cancel the Prolia.  She was told to take calcium and vitamin D twice daily.    Orders placed this encounter:  Orders Placed This Encounter  Procedures  . MM Digital Diagnostic Unilat R  . CBC with Differential  . Comprehensive metabolic panel      Derek Jack, MD [place] [phone]

## 2017-09-02 NOTE — Assessment & Plan Note (Signed)
1.  Left breast cancer: She had a stage III breast adenocarcinoma, left mastectomy with reconstruction done in 1987.  It was 6.5 cm tumor with 2 out of 27 nodes positive.  She underwent chemotherapy with CAF VP.  She took antiestrogen therapy for 12 months.  She could not tolerate tamoxifen.  Her physical exam did not reveal any abnormalities.  A mammogram of the right breast on 08/27/2017 was BI-RADS Category 1.  I have reordered mammogram in 1 year.  She will come back in 1 year for follow-up.  2.  Osteoporosis: Her most recent DEXA scan on 08/27/2017 shows T score of -2.7 in the femur.  There is no significant change from last year.  We talked about initiating her on Prolia once every 6 months.  But I have reviewed her medical record and saw that she is already on Fosamax.  Hence we will cancel the Prolia.

## 2017-09-30 ENCOUNTER — Encounter: Payer: Self-pay | Admitting: Podiatry

## 2017-09-30 ENCOUNTER — Ambulatory Visit: Payer: Medicare Other | Admitting: Podiatry

## 2017-09-30 DIAGNOSIS — L989 Disorder of the skin and subcutaneous tissue, unspecified: Secondary | ICD-10-CM | POA: Diagnosis not present

## 2017-09-30 DIAGNOSIS — M7751 Other enthesopathy of right foot: Secondary | ICD-10-CM | POA: Diagnosis not present

## 2017-10-03 NOTE — Progress Notes (Signed)
   Subjective: Patient presents to the office today for chief complaint of a painful callus lesion of the right great toe that has been present for several months. She also reports pain to the 1st MPJ of the right foot. She states injections have helped alleviate the pain in the past. She has not done anything to treat her symptoms at home. Patient is here for further evaluation and treatment.   Past Medical History:  Diagnosis Date  . Atrial fibrillation ([place])    treated with multaq x 6 mos in 2011....[name]=2 (Age; + CHF; no CVA; no DM2). Echo 05/24/10: EF 30% mild AS(mean 54mmgHg); mild LAE; mod LAE; PASP 35 Systolic CHF  . Breast cancer ([place])     in 1987 with a left  mastectomy  . Cardiomyopathy    a. tachy mediated Myoview 06/06/10:  EF 31%; no scar or ischemia; EF up to 50% per echo November 2013  . Diverticulosis   . DJD (degenerative joint disease) of knee    right knee  . Falls   . Hypercholesteremia   . Hypertension   . Lumbar stenosis   . Osteoporosis    history of bone chips in the right knee,previous right fibular fracture, operated on by Dr. Veverly Fells  . RLS (restless legs syndrome)   . Vitamin D deficiency      Objective:  Physical Exam General: Alert and oriented x3 in no acute distress  Dermatology: Hyperkeratotic lesion present on the right great toe. Pain on palpation with a central nucleated core noted. Skin is warm, dry and supple bilateral lower extremities. Negative for open lesions or macerations.  Vascular: Palpable pedal pulses bilaterally. No edema or erythema noted. Capillary refill within normal limits.  Neurological: Epicritic and protective threshold grossly intact bilaterally.   Musculoskeletal Exam: Pain on palpation at the keratotic lesion noted as well as to the 1st MPJ of the right foot. Range of motion within normal limits bilateral. Muscle strength 5/5 in all groups bilateral.  Assessment: 1. 1st MPJ capsulitis right 2. Callus lesion right  great toe   Plan of Care:  1. Patient evaluated 2. Excisional debridement of keratoic lesion using a chisel blade was performed without incident.  3. Dressed area with light dressing. 4. Injection of 0.5 mLs Celestone Soluspan injected into the 1st MPJ of the right foot.  5. Recommended good shoe gear.  6. Patient is to return to the clinic PRN.   Going to [place] to visit her daughter and grandchildren in October 2019.   Edrick Kins, DPM [facility]  Dr. Edrick Kins, [place]                                        [place], [place] [address]                Office [phone]  Fax [phone]

## 2017-12-01 ENCOUNTER — Other Ambulatory Visit: Payer: Self-pay | Admitting: Cardiovascular Disease

## 2017-12-02 NOTE — Telephone Encounter (Signed)
Age 83 years Wt 71.4 kg  09/02/2017  Saw Dr Johnsie Cancel on 07/23/2017 06/04/2017 Hgb 13.3 HCT 38.2 SrCr 0.86 CrCl 55.87 Refill for Xarelto 20 mg daily as requested

## 2017-12-03 ENCOUNTER — Ambulatory Visit: Payer: Medicare Other | Admitting: *Deleted

## 2017-12-03 DIAGNOSIS — I4891 Unspecified atrial fibrillation: Secondary | ICD-10-CM | POA: Diagnosis not present

## 2017-12-03 NOTE — Progress Notes (Signed)
Pt was started on Xarelto 20mg  for Afib on 09/28/2015 by Dr. Johnsie Cancel.    Reviewed patients medication list.  Pt is not currently on any combined P-gp and strong CYP3A4 inhibitors/inducers (ketoconazole, traconazole, ritonavir, carbamazepine, phenytoin, rifampin, St. John's wort).  Reviewed labs: SCr-0.75, Hgb-13.7, HCT-41.7, Weight-70.7kg (155.2lbs), CrCl- 63.98ml/min.  Dose appropriate based on dosing criteria and CrCl.   Hgb and HCT within normal limits.   A full discussion of the nature of anticoagulants has been carried out.  A benefit/risk analysis has been presented to the patient, so that they understand the justification for choosing anticoagulation with Xarelto at this time.  The need for compliance is stressed.  Pt is aware to take the medication once daily with the largest meal of the day.  Side effects of potential bleeding are discussed, including unusual colored urine or stools, coughing up blood or coffee ground emesis, nose bleeds or serious fall or head trauma.  Discussed signs and symptoms of stroke. The patient should avoid any OTC items containing aspirin or ibuprofen.  Avoid alcohol consumption.  Call if any signs of abnormal bleeding.  Discussed financial obligations and resolved any difficulty in obtaining medication. Next lab test in 6 months.   12/03/17: Pt states she needs a refill for her Xarelto and that she prefers a 6 month follow-up. Pt would like to be called after 10am.   12/04/17: labs reviewed & called pt at 1038am to report labs and instructed her to continue taking Xarelto 20mg  daily with the largest meal. Pt prefers a 6 month follow-up in the office, therefore, made an appt for 06/10/18 at 1130am over the phone with the patient. Refill for Xarelto 20mg  was sent in to pharmacy on 12/02/17 and confirmed with Abby at [facility]-the requested pharmacy the patient uses. Pt aware refill has been taken care of.

## 2017-12-04 LAB — BASIC METABOLIC PANEL
BUN/Creatinine Ratio: 19 (ref 12–28)
BUN: 14 mg/dL (ref 8–27)
CALCIUM: 9.4 mg/dL (ref 8.7–10.3)
CHLORIDE: 104 mmol/L (ref 96–106)
CO2: 24 mmol/L (ref 20–29)
Creatinine, Ser: 0.75 mg/dL (ref 0.57–1.00)
GFR calc Af Amer: 85 mL/min/{1.73_m2} (ref >59)
GFR calc non Af Amer: 74 mL/min/{1.73_m2} (ref >59)
Glucose: 94 mg/dL (ref 65–99)
POTASSIUM: 4.8 mmol/L (ref 3.5–5.2)
Sodium: 141 mmol/L (ref 134–144)

## 2017-12-04 LAB — CBC
HEMATOCRIT: 41.7 % (ref 34.0–46.6)
Hemoglobin: 13.7 g/dL (ref 11.1–15.9)
MCH: 30.7 pg (ref 26.6–33.0)
MCHC: 32.9 g/dL (ref 31.5–35.7)
MCV: 94 fL (ref 79–97)
RBC: 4.46 x10E6/uL (ref 3.77–5.28)
RDW: 13.3 % (ref 12.3–15.4)
WBC: 8.7 10*3/uL (ref 3.4–10.8)

## 2017-12-07 ENCOUNTER — Telehealth: Payer: Self-pay | Admitting: Cardiovascular Disease

## 2017-12-07 NOTE — Telephone Encounter (Signed)
Called patient back with lab results. 

## 2017-12-07 NOTE — Telephone Encounter (Signed)
New Message   Pt returning call for [facility]

## 2018-02-03 ENCOUNTER — Other Ambulatory Visit: Payer: Self-pay | Admitting: Cardiovascular Disease

## 2018-02-04 ENCOUNTER — Encounter: Payer: Self-pay | Admitting: Internal Medicine

## 2018-02-04 ENCOUNTER — Ambulatory Visit (INDEPENDENT_AMBULATORY_CARE_PROVIDER_SITE_OTHER): Payer: Medicare Other | Admitting: Internal Medicine

## 2018-02-04 ENCOUNTER — Other Ambulatory Visit (INDEPENDENT_AMBULATORY_CARE_PROVIDER_SITE_OTHER): Payer: Medicare Other

## 2018-02-04 VITALS — BP 124/70 | HR 88 | Temp 97.6°F | Ht 64.75 in | Wt 155.0 lb

## 2018-02-04 DIAGNOSIS — Z23 Encounter for immunization: Secondary | ICD-10-CM

## 2018-02-04 DIAGNOSIS — H6122 Impacted cerumen, left ear: Secondary | ICD-10-CM

## 2018-02-04 DIAGNOSIS — E785 Hyperlipidemia, unspecified: Secondary | ICD-10-CM

## 2018-02-04 DIAGNOSIS — Z0000 Encounter for general adult medical examination without abnormal findings: Secondary | ICD-10-CM | POA: Diagnosis not present

## 2018-02-04 DIAGNOSIS — M810 Age-related osteoporosis without current pathological fracture: Secondary | ICD-10-CM | POA: Diagnosis not present

## 2018-02-04 DIAGNOSIS — I10 Essential (primary) hypertension: Secondary | ICD-10-CM

## 2018-02-04 DIAGNOSIS — N393 Stress incontinence (female) (male): Secondary | ICD-10-CM

## 2018-02-04 LAB — LIPID PANEL
Cholesterol: 233 mg/dL — ABNORMAL HIGH (ref 0–200)
HDL: 43.8 mg/dL (ref >39.00)
NonHDL: 188.92
TRIGLYCERIDES: 247 mg/dL — AB (ref 0.0–149.0)
Total CHOL/HDL Ratio: 5
VLDL: 49.4 mg/dL — AB (ref 0.0–40.0)

## 2018-02-04 LAB — LDL CHOLESTEROL, DIRECT: LDL DIRECT: 141 mg/dL

## 2018-02-04 MED ORDER — MIRABEGRON ER 25 MG PO TB24: 25.0000 mg | ORAL_TABLET | Freq: Every day | ORAL | 3 refills | Status: DC

## 2018-02-04 NOTE — Patient Instructions (Signed)

## 2018-02-04 NOTE — Progress Notes (Signed)
   Subjective:    Patient ID: Pamela Whitaker, female    DOB: Dec 05, 1932, 84 y.o.   MRN: [id number]  HPI Here for medicare wellness and physical, no new complaints. Please see A/P for status and treatment of chronic medical problems.   Diet: heart healthy Physical activity: sedentary Depression/mood screen: negative Hearing: intact to whispered voice Visual acuity: grossly normal, performs annual eye exam  ADLs: capable Fall risk: none Home safety: good Cognitive evaluation: intact to orientation, naming, recall and repetition EOL planning: adv directives discussed  I have personally reviewed and have noted 1. The patient's medical and social history - reviewed today no changes 2. Their use of alcohol, tobacco or illicit drugs 3. Their current medications and supplements 4. The patient's functional ability including ADL's, fall risks, home safety risks and hearing or visual impairment. 5. Diet and physical activities 6. Evidence for depression or mood disorders 7. Care team reviewed and updated (available in snapshot)  Review of Systems  Constitutional: Negative.   HENT: Negative.   Eyes: Negative.   Respiratory: Negative for cough, chest tightness and shortness of breath.   Cardiovascular: Negative for chest pain, palpitations and leg swelling.  Gastrointestinal: Negative for abdominal distention, abdominal pain, constipation, diarrhea, nausea and vomiting.  Musculoskeletal: Negative.   Skin: Negative.   Neurological: Negative.   Psychiatric/Behavioral: Negative.       Objective:   Physical Exam  Constitutional: She is oriented to person, place, and time. She appears well-developed and well-nourished.  HENT:  Head: Normocephalic and atraumatic.  TMs normal after disimpaction  Eyes: EOM are normal.  Neck: Normal range of motion.  Cardiovascular: Normal rate and regular rhythm.  Pulmonary/Chest: Effort normal and breath sounds normal. No respiratory distress. She has no  wheezes. She has no rales.  Abdominal: Soft. Bowel sounds are normal. She exhibits no distension. There is no tenderness. There is no rebound.  Musculoskeletal: She exhibits no edema.  Neurological: She is alert and oriented to person, place, and time. Coordination normal.  Skin: Skin is warm and dry.  Psychiatric: She has a normal mood and affect.   Vitals:   02/04/18 1047  BP: 124/70  Pulse: 88  Temp: 97.6 F (36.4 C)  TempSrc: Oral  SpO2: 96%  Weight: 155 lb (70.3 kg)  Height: 5' 4.75" (1.645 m)      Assessment & Plan:  Tdap and flu given at visit

## 2018-02-04 NOTE — Progress Notes (Signed)
Performed ear lavage on left ear due to cerumen impaction

## 2018-02-05 NOTE — Assessment & Plan Note (Signed)
Has not taken fosamax due to not liking it. She wants to remain off meds for now.

## 2018-02-05 NOTE — Assessment & Plan Note (Signed)
Taking myrbetriq 25 mg daily. Refilled.

## 2018-02-05 NOTE — Assessment & Plan Note (Signed)
Flu shot given. Pneumonia complete. Shingrix counseled. Tetanus given. Colonoscopy aged out. Mammogram yearly, pap smear not indicated and dexa up to date. Counseled about sun safety and mole surveillance. Counseled about the dangers of distracted driving. Given 10 year screening recommendations.

## 2018-02-05 NOTE — Assessment & Plan Note (Signed)
BP at goal on coreg. Checking CMP and adjust as needed.  

## 2018-02-05 NOTE — Assessment & Plan Note (Signed)
Does not want statin therapy. Needs lipids for monitoring which is ordered today.

## 2018-03-02 ENCOUNTER — Ambulatory Visit: Payer: Medicare Other | Admitting: Podiatry

## 2018-03-04 ENCOUNTER — Ambulatory Visit (INDEPENDENT_AMBULATORY_CARE_PROVIDER_SITE_OTHER): Payer: Medicare Other

## 2018-03-04 ENCOUNTER — Ambulatory Visit: Payer: Medicare Other | Admitting: Podiatry

## 2018-03-04 ENCOUNTER — Telehealth: Payer: Self-pay | Admitting: *Deleted

## 2018-03-04 ENCOUNTER — Other Ambulatory Visit: Payer: Self-pay | Admitting: Podiatry

## 2018-03-04 DIAGNOSIS — M25569 Pain in unspecified knee: Secondary | ICD-10-CM

## 2018-03-04 DIAGNOSIS — M79671 Pain in right foot: Secondary | ICD-10-CM

## 2018-03-04 DIAGNOSIS — M76821 Posterior tibial tendinitis, right leg: Secondary | ICD-10-CM | POA: Diagnosis not present

## 2018-03-04 DIAGNOSIS — M7751 Other enthesopathy of right foot: Secondary | ICD-10-CM

## 2018-03-04 NOTE — Progress Notes (Signed)
She presents today after I have not seen her for quite some time.  She states that the injection that I put in across the top of the foot is doing much better and she is still having weakness in her right foot and she feels that that is part of what is going on with her knee.  Objective: Vital signs are stable alert and oriented x3 I reviewed her past medical history medications allergy surgeries social history review of systems.  Pulses are palpable bilateral capillary fill time is immediate neurologic sensorium is intact tendon tendon deep tendon reflexes are intact bilateral muscle strength is 5/5 dorsiflexors plantar flexors inverters everters onto the musculature is intact with exception of the posterior tibial tendon right which does demonstrate a weakness.  She also has a valgus deformity of the right knee.  Cutaneous evaluation demonstrates supple well-hydrated cutis no erythema edema cellulitis drainage or odor.  Assessment: Pain in limb secondary to posterior tibial tendon dysfunction right with genu valgum.  Plan: Discussed etiology pathology conservative or surgical therapies.  At this point I have referred her to [facility] for a possible [place] brace right.

## 2018-03-04 NOTE — Telephone Encounter (Signed)
-----   Message from Rip Harbour, [facility] sent at 03/04/2018  3:03 PM EDT ----- Regarding: Referral to [facility] - see for knee pain

## 2018-03-04 NOTE — Telephone Encounter (Signed)
Referral, clinicals and demographics faxed to [facility], will fax 03/04/2018 clinical once available.

## 2018-03-09 ENCOUNTER — Ambulatory Visit: Payer: Medicare Other | Admitting: Orthotics

## 2018-03-09 DIAGNOSIS — M76821 Posterior tibial tendinitis, right leg: Secondary | ICD-10-CM

## 2018-03-09 DIAGNOSIS — M76829 Posterior tibial tendinitis, unspecified leg: Secondary | ICD-10-CM

## 2018-03-19 NOTE — Progress Notes (Signed)
Cardiology Office Note    Date:  03/24/2018   ID:  SHAKENDRA GRIFFETH, DOB 1932-09-06, MRN [id number]  PCP:  Hoyt Koch, MD  Cardiologist:  Dr. Johnsie Cancel  CC: 6 month follow up  History of Present Illness:   84 y.o. chronic afib CHA2VASC 5 on xarelto. Failed Multaq and amiodarone rate control strategy adoopted  History of NIDCM improved last EF by myovue 03/09/16 62%. Likes to complain About medication and find reasons not to take them with myriad of somatic complaints. Some dementia contributes to problem  No palpitations, dyspnea or chest pain Seems to be compliant with meds   03/04/18 referred to Raliegh Ip by primary for knee pain Weakness in right foot contributing Seen by Tyson Dense ? Posterior Tibial tendon dysfunction   Told her she could take her higher dose of beta blocker at night as it helps her sleep   Past Medical History:  Diagnosis Date  . Atrial fibrillation ([place])    treated with multaq x 6 mos in 2011....[name]=2 (Age; + CHF; no CVA; no DM2). Echo 05/24/10: EF 30% mild AS(mean 22mgHg); mild LAE; mod LAE; PASP 35 Systolic CHF  . Breast cancer ([place]     in 1987 with a left  mastectomy  . Cardiomyopathy    a. tachy mediated Myoview 06/06/10:  EF 31%; no scar or ischemia; EF up to 50% per echo November 2013  . Diverticulosis   . DJD (degenerative joint disease) of knee    right knee  . Falls   . Hypercholesteremia   . Hypertension   . Lumbar stenosis   . Osteoporosis    history of bone chips in the right knee,previous right fibular fracture, operated on by Dr. NVeverly Fells . RLS (restless legs syndrome)   . Vitamin D deficiency     Past Surgical History:  Procedure Laterality Date  . BREAST RECONSTRUCTION  1988   left breast  . CARDIOVERSION  2012  . KNEE ARTHROSCOPY  2005  . MASTECTOMY, RADICAL  1987   left breast  . WRIST FRACTURE SURGERY     left wrist    Current Medications: Outpatient Medications Prior to Visit  Medication Sig Dispense  Refill  . acetaminophen (TYLENOL) 500 MG tablet Take 500 mg by mouth every 6 (six) hours as needed (pt takes 1-2 tablets at night for pain).    . carvedilol (COREG) 6.25 MG tablet Take 2 tablets by mouth every morning and 1 tablet every evening. Please keep upcoming appt in October for future refills. Thank you 90 tablet 5  . Cholecalciferol (VITAMIN D) 2000 UNITS tablet Take 2,000 Units by mouth daily. Does not take regularly    . mirabegron ER (MYRBETRIQ) 25 MG TB24 tablet Take 1 tablet (25 mg total) by mouth daily. 90 tablet 3  . Multiple Vitamin (MULTIVITAMIN) capsule Take 1 capsule by mouth daily. Women's Centrum    . XARELTO 20 MG TABS tablet TAKE 1 TABLET (20 MG TOTAL) BY MOUTH DAILY WITH SUPPER. 90 tablet 1   No facility-administered medications prior to visit.      Allergies:   Amiodarone; Lipitor [atorvastatin]; and Multaq [dronedarone]   Social History   Socioeconomic History  . Marital status: Married    Spouse name: kChrissie Noa . Number of children: 2  . Years of education: college  . Highest education level: Not on file  Occupational History  . Occupation: retired    EFish farm manager RETIRED  Social Needs  . Financial resource strain: Not  on file  . Food insecurity:    Worry: Not on file    Inability: Not on file  . Transportation needs:    Medical: Not on file    Non-medical: Not on file  Tobacco Use  . Smoking status: Never Smoker  . Smokeless tobacco: Never Used  Substance and Sexual Activity  . Alcohol use: No  . Drug use: No  . Sexual activity: Never  Lifestyle  . Physical activity:    Days per week: Not on file    Minutes per session: Not on file  . Stress: Not on file  Relationships  . Social connections:    Talks on phone: Not on file    Gets together: Not on file    Attends religious service: Not on file    Active member of club or organization: Not on file    Attends meetings of clubs or organizations: Not on file    Relationship status: Not on file    Other Topics Concern  . Not on file  Social History Narrative  . Not on file     Family History:  The patient's family history includes Aortic aneurysm in her mother; Breast cancer in her sister; Diabetes in her sister; Heart attack in her father; Stomach cancer in her sister.     ROS:   Please see the history of present illness.    ROS All other systems reviewed and are negative.   PHYSICAL EXAM:   VS:  BP 128/72   Pulse 66   Ht 5' 4.75" (1.645 m)   Wt 159 lb (72.1 kg)   SpO2 96%   BMI 26.66 kg/m    Affect appropriate Healthy:  appears stated age HEENT: normal Neck supple with no adenopathy JVP normal no bruits no thyromegaly Lungs clear with no wheezing and good diaphragmatic motion Heart:  S1/S2 no murmur, no rub, gallop or click PMI normal Abdomen: benighn, BS positve, no tenderness, no AAA no bruit.  No HSM or HJR Distal pulses intact with no bruits No edema Neuro non-focal Skin warm and dry No muscular weakness   Wt Readings from Last 3 Encounters:  03/24/18 159 lb (72.1 kg)  02/04/18 155 lb (70.3 kg)  09/02/17 157 lb 8 oz (71.4 kg)      Studies/Labs Reviewed:   EKG:  07/23/17  atrial fibrillation . HR 88  Recent Labs: 12/03/2017: BUN 14; Creatinine, Ser 0.75; Hemoglobin 13.7; Platelets CANCELED; Potassium 4.8; Sodium 141   Lipid Panel    Component Value Date/Time   CHOL 233 (H) 02/04/2018 1128   TRIG 247.0 (H) 02/04/2018 1128   HDL 43.80 02/04/2018 1128   CHOLHDL 5 02/04/2018 1128   VLDL 49.4 (H) 02/04/2018 1128   LDLCALC 171 (H) 03/20/2015 1351   LDLDIRECT 141.0 02/04/2018 1128    Additional studies/ records that were reviewed today include:  2D ECHO: 04/21/2012 LV EF: 50% -  55% Study Conclusion - Left ventricle: The cavity size was normal. Wall thickness was normal. Systolic function was normal. The estimated ejection fraction was in the range of 50% to 55%. Wall motion was normal; there were no regional wall  motion abnormalities. - Aortic valve: There was very mild stenosis. Mild regurgitation. - Mitral valve: Mild regurgitation. - Atrial septum: No defect or patent foramen ovale was identified. - Tricuspid valve: Moderate regurgitation.   ASSESSMENT & PLAN:   Chronic atrial fibrillation: continue Xarelto 37m daily. CHADSVASC score at least 5 (CHF, HTN, age, F sex).  Rate  well controlled on Coreg 12.33m BID.  She attributes many different sx to Xarelto which I think is unrelated   Fatigue: labs and TSH ok f/u pirmary   DOE: functional EF normal by myovue   HTN: BP well controlled on current regimen   HLD: lipids followed by PCP. TC 265, TG 233 and LDL 177. PCP recommended that she start statin therapy. She refuses statin therapy.   Hx of NICM: EF improved back to low normal in 2013. Appears euvolemic. EF 62% myovue 03/10/16  Back Pain: referred to Dr NSherwood Gamblerby primary she stopped fosamax as she thinks it made pain worse   PJenkins Rouge

## 2018-03-24 ENCOUNTER — Ambulatory Visit: Payer: Medicare Other | Admitting: Cardiovascular Disease

## 2018-03-24 ENCOUNTER — Encounter: Payer: Self-pay | Admitting: Cardiovascular Disease

## 2018-03-24 VITALS — BP 128/72 | HR 66 | Ht 64.75 in | Wt 159.0 lb

## 2018-03-24 DIAGNOSIS — R5383 Other fatigue: Secondary | ICD-10-CM

## 2018-03-24 DIAGNOSIS — R0609 Other forms of dyspnea: Secondary | ICD-10-CM | POA: Diagnosis not present

## 2018-03-24 DIAGNOSIS — I10 Essential (primary) hypertension: Secondary | ICD-10-CM

## 2018-03-24 DIAGNOSIS — I482 Chronic atrial fibrillation, unspecified: Secondary | ICD-10-CM

## 2018-03-24 DIAGNOSIS — E785 Hyperlipidemia, unspecified: Secondary | ICD-10-CM

## 2018-03-24 NOTE — Patient Instructions (Signed)

## 2018-04-20 ENCOUNTER — Ambulatory Visit (INDEPENDENT_AMBULATORY_CARE_PROVIDER_SITE_OTHER): Payer: Medicare Other | Admitting: Orthotics

## 2018-04-20 DIAGNOSIS — Z9181 History of falling: Secondary | ICD-10-CM

## 2018-04-20 DIAGNOSIS — M76821 Posterior tibial tendinitis, right leg: Secondary | ICD-10-CM

## 2018-04-20 DIAGNOSIS — M76829 Posterior tibial tendinitis, unspecified leg: Secondary | ICD-10-CM

## 2018-04-20 DIAGNOSIS — M7751 Other enthesopathy of right foot: Secondary | ICD-10-CM | POA: Diagnosis not present

## 2018-05-02 NOTE — Progress Notes (Signed)
Cast for AFO using [employer] and [place] for [place] type brace to address PTTD.

## 2018-05-02 NOTE — Progress Notes (Signed)
Patient came in today to pick up standard Afo brace.  Patient was evaluated for fit and function.   The brace fit very well and there were any complaints of the way it felt once donned.  The brace offered ankle stability in both saggital and coroneal planes.  Patient advised to always wear proper fitting shoes with brace. 

## 2018-05-05 ENCOUNTER — Other Ambulatory Visit: Payer: Self-pay | Admitting: Cardiovascular Disease

## 2018-05-05 NOTE — Telephone Encounter (Signed)
Pt is a 84 yr old female who saw Dr. Johnsie Cancel on 03/24/18. Weight at that visit was 72.1Kg. Last SCr on 12/03/17 was 0.75. CrCl is 60mL/min. Will refill Xarelto 20mg  QD.

## 2018-05-18 NOTE — Progress Notes (Signed)
This encounter was created in error - please disregard.

## 2018-06-10 ENCOUNTER — Ambulatory Visit (INDEPENDENT_AMBULATORY_CARE_PROVIDER_SITE_OTHER): Payer: Medicare Other | Admitting: Pharmacist

## 2018-06-10 ENCOUNTER — Other Ambulatory Visit: Payer: Medicare Other

## 2018-06-10 DIAGNOSIS — I4891 Unspecified atrial fibrillation: Secondary | ICD-10-CM

## 2018-06-10 NOTE — Progress Notes (Addendum)
Pt was started on Xarelto for chronic atrial fibrillation on 12/02/2017.    Reviewed patients medication list.  Pt is not currently on any combined P-gp and strong CYP3A4 inhibitors/inducers (ketoconazole, traconazole, ritonavir, carbamazepine, phenytoin, rifampin, St. John's wort).  Reviewed labs.  SCr 0.71, Weight 72.1 kg, CrCl- 79 mL/min.  Dose appropriate based on CrCl.   Hgb and HCT Within Normal Limits.  A full discussion of the nature of anticoagulants has been carried out.  A benefit/risk analysis has been presented to the patient, so that they understand the justification for choosing anticoagulation with Xarelto at this time.  The need for compliance is stressed.  Pt is aware to take the medication once daily with the largest meal of the day.  Side effects of potential bleeding are discussed, including unusual colored urine or stools, coughing up blood or coffee ground emesis, nose bleeds or serious fall or head trauma.  Discussed signs and symptoms of stroke. The patient should avoid any OTC items containing aspirin or ibuprofen.  Avoid alcohol consumption.   Call if any signs of abnormal bleeding. Discussed financial obligations and resolved any difficulty in obtaining medication.  Patient should check BMET and CBC yearly.

## 2018-06-11 LAB — BASIC METABOLIC PANEL
BUN/Creatinine Ratio: 11 — ABNORMAL LOW (ref 12–28)
BUN: 8 mg/dL (ref 8–27)
CALCIUM: 9.4 mg/dL (ref 8.7–10.3)
CO2: 23 mmol/L (ref 20–29)
Chloride: 104 mmol/L (ref 96–106)
Creatinine, Ser: 0.71 mg/dL (ref 0.57–1.00)
GFR, EST AFRICAN AMERICAN: 91 mL/min/{1.73_m2} (ref >59)
GFR, EST NON AFRICAN AMERICAN: 79 mL/min/{1.73_m2} (ref >59)
Glucose: 105 mg/dL — ABNORMAL HIGH (ref 65–99)
POTASSIUM: 4.4 mmol/L (ref 3.5–5.2)
Sodium: 142 mmol/L (ref 134–144)

## 2018-06-11 LAB — CBC
HEMATOCRIT: 38.6 % (ref 34.0–46.6)
Hemoglobin: 13.4 g/dL (ref 11.1–15.9)
MCH: 31.2 pg (ref 26.6–33.0)
MCHC: 34.7 g/dL (ref 31.5–35.7)
MCV: 90 fL (ref 79–97)
RBC: 4.29 x10E6/uL (ref 3.77–5.28)
RDW: 12.5 % (ref 11.7–15.4)
WBC: 11.5 10*3/uL — AB (ref 3.4–10.8)

## 2018-06-14 ENCOUNTER — Ambulatory Visit: Payer: Self-pay

## 2018-06-14 NOTE — Progress Notes (Signed)
Subjective:    Patient ID: Pamela [place], female    DOB: [birth year], 85 y.o.   MRN: [id number]  HPI The patient is here for an acute visit.  Her WBC count was elevated on 1/16 at 11.5.  She was advised by cardiology to come for further evaluation because of this and some other symptoms.  Left lower quadrant pain, nausea: She states that this started about 2 weeks ago.  She also started having nausea about 2 weeks ago and has vomited twice in the past 2 weeks.  The left lower quadrant pain is intermittent, but it does hurt when she touches that area.  The pain is not improved or worsened since it started.  She has taken Tylenol for it and it has helped.  She denies any diarrhea, constipation or blood in the stool.  She denies dysuria, increased urinary frequency and blood in the urine.  When asked about abnormal urine color or odor she says maybe.  Her daughter also states that she has had a UTI in the past and not realized it.  She denies any fevers, cold symptoms.   Medications and allergies reviewed with patient and updated if appropriate.  Patient Active Problem List   Diagnosis Date Noted  . Osteoporosis 03/02/2017  . Urinary incontinence 10/14/2015  . Routine general medical examination at a health care facility 12/06/2014  . HX: breast cancer 06/29/2014  . Hypertension   . Vitamin D deficiency   . Spinal stenosis, lumbar region, with neurogenic claudication 03/28/2013    Class: Chronic  . At high risk for falls 02/23/2013  . Systolic heart failure ([place]) 06/07/2010  . Congestive dilated cardiomyopathy ([place]) 05/29/2010  . Atrial fibrillation ([place]) 04/16/2009  . Elevated lipids 04/13/2009  . Osteoarthritis 04/13/2009    Current Outpatient Medications on File Prior to Visit  Medication Sig Dispense Refill  . acetaminophen (TYLENOL) 500 MG tablet Take 500 mg by mouth every 6 (six) hours as needed (pt takes 1-2 tablets at night for pain).    . carvedilol (COREG) 6.25 MG  tablet Take 2 tablets by mouth every morning and 1 tablet every evening. Please keep upcoming appt in October for future refills. Thank you 90 tablet 5  . Cholecalciferol (VITAMIN D) 2000 UNITS tablet Take 2,000 Units by mouth daily. Does not take regularly    . mirabegron ER (MYRBETRIQ) 25 MG TB24 tablet Take 1 tablet (25 mg total) by mouth daily. 90 tablet 3  . Multiple Vitamin (MULTIVITAMIN) capsule Take 1 capsule by mouth daily. Women's Centrum    . XARELTO 20 MG TABS tablet TAKE 1 TABLET (20 MG TOTAL) BY MOUTH DAILY WITH SUPPER. 90 tablet 3   No current facility-administered medications on file prior to visit.     Past Medical History:  Diagnosis Date  . Atrial fibrillation ([place])    treated with multaq x 6 mos in 2011....[name]=2 (Age; + CHF; no CVA; no DM2). Echo 05/24/10: EF 30% mild AS(mean 67mmgHg); mild LAE; mod LAE; PASP 35 Systolic CHF  . Breast cancer ([place])     in 1987 with a left  mastectomy  . Cardiomyopathy    a. tachy mediated Myoview 06/06/10:  EF 31%; no scar or ischemia; EF up to 50% per echo November 2013  . Diverticulosis   . DJD (degenerative joint disease) of knee    right knee  . Falls   . Hypercholesteremia   . Hypertension   . Lumbar stenosis   . Osteoporosis  history of bone chips in the right knee,previous right fibular fracture, operated on by Dr. Veverly Fells  . RLS (restless legs syndrome)   . Vitamin D deficiency     Past Surgical History:  Procedure Laterality Date  . BREAST RECONSTRUCTION  1988   left breast  . CARDIOVERSION  2012  . KNEE ARTHROSCOPY  2005  . MASTECTOMY, RADICAL  1987   left breast  . WRIST FRACTURE SURGERY     left wrist    Social History   Socioeconomic History  . Marital status: Married    Spouse name: Chrissie Noa  . Number of children: 2  . Years of education: college  . Highest education level: Not on file  Occupational History  . Occupation: retired    Fish farm manager: RETIRED  Social Needs  . Financial resource strain:  Not on file  . Food insecurity:    Worry: Not on file    Inability: Not on file  . Transportation needs:    Medical: Not on file    Non-medical: Not on file  Tobacco Use  . Smoking status: Never Smoker  . Smokeless tobacco: Never Used  Substance and Sexual Activity  . Alcohol use: No  . Drug use: No  . Sexual activity: Never  Lifestyle  . Physical activity:    Days per week: Not on file    Minutes per session: Not on file  . Stress: Not on file  Relationships  . Social connections:    Talks on phone: Not on file    Gets together: Not on file    Attends religious service: Not on file    Active member of club or organization: Not on file    Attends meetings of clubs or organizations: Not on file    Relationship status: Not on file  Other Topics Concern  . Not on file  Social History Narrative  . Not on file    Family History  Problem Relation Age of Onset  . Heart attack Father   . Aortic aneurysm Mother   . Breast cancer Sister   . Diabetes Sister   . Stomach cancer Sister     Review of Systems  Constitutional: Negative for chills and fever.  HENT: Negative for congestion, ear pain, sinus pain and sore throat.   Respiratory: Positive for cough (chronic, dry) and shortness of breath (with Afib). Negative for wheezing.   Gastrointestinal: Positive for abdominal pain (llq), nausea and vomiting (2 times). Negative for blood in stool, constipation and diarrhea.  Genitourinary: Negative for dysuria, frequency and hematuria.       Objective:   Vitals:   06/15/18 1304  BP: 140/64  Pulse: 84  Resp: 16  Temp: (!) 97.4 F (36.3 C)  SpO2: 99%   BP Readings from Last 3 Encounters:  06/15/18 140/64  03/24/18 128/72  02/04/18 124/70   Wt Readings from Last 3 Encounters:  06/15/18 163 lb 12.8 oz (74.3 kg)  03/24/18 159 lb (72.1 kg)  02/04/18 155 lb (70.3 kg)   Body mass index is 27.47 kg/m.   Physical Exam Constitutional:      General: She is not in acute  distress.    Appearance: She is well-developed. She is not ill-appearing.  HENT:     Head: Normocephalic and atraumatic.  Cardiovascular:     Rate and Rhythm: Normal rate and regular rhythm.  Pulmonary:     Effort: Pulmonary effort is normal. No respiratory distress.     Breath sounds: Normal breath  sounds. No wheezing or rhonchi.  Abdominal:     General: Bowel sounds are normal.     Palpations: Abdomen is soft.     Tenderness: There is abdominal tenderness (Mild) in the suprapubic area and left lower quadrant. There is no right CVA tenderness, left CVA tenderness, guarding or rebound.  Skin:    General: Skin is warm and dry.  Neurological:     Mental Status: She is alert.            Assessment & Plan:    See Problem List for Assessment and Plan of chronic medical problems.

## 2018-06-14 NOTE — Telephone Encounter (Signed)
Pt called wanting to schedule an appointment with Dr Sharlet Salina. Pt stated that she had blood drawn and her cardiologist told her to call her PCP to schedule an appointment for elevated WBC count of 11.5 on 06/10/18. Pt stated last week she began having pain on the left side of her abdomen near her pelvic bone. Pt stated that the pain is sore to touch. Pain is moderate. Pt stated last week she began to have vomiting and dry heaves. Pt no longer has her symptoms. Pt does not have pain if she is sitting or lying still. Pt given care advice and pt verbalized understanding. Pt given appointment with Dr Quay Burow tomorrow. PCP with no appointment openings. Pt verbalized understanding.   Reason for Disposition . Age > 60 years  Answer Assessment - Initial Assessment Questions 1. LOCATION: "Where does it hurt?"      Left side around pelvic bone 2. RADIATION: "Does the pain shoot anywhere else?" (e.g., chest, back)     no 3. ONSET: "When did the pain begin?" (e.g., minutes, hours or days ago)      Last week 4. SUDDEN: "Gradual or sudden onset?"     gradually 5. PATTERN "Does the pain come and go, or is it constant?"    - If constant: "Is it getting better, staying the same, or worsening?"      (Note: Constant means the pain never goes away completely; most serious pain is constant and it progresses)     - If intermittent: "How long does it last?" "Do you have pain now?"     (Note: Intermittent means the pain goes away completely between bouts)    Very  Sore with palpation and sore without touching the area 6. SEVERITY: "How bad is the pain?"  (e.g., Scale 1-10; mild, moderate, or severe)   - MILD (1-3): doesn't interfere with normal activities, abdomen soft and not tender to touch    - MODERATE (4-7): interferes with normal activities or awakens from sleep, tender to touch    - SEVERE (8-10): excruciating pain, doubled over, unable to do any normal activities      Moderate 7. RECURRENT SYMPTOM: "Have you  ever had this type of abdominal pain before?" If so, ask: "When was the last time?" and "What happened that time?"      no 8. CAUSE: "What do you think is causing the abdominal pain?"     Pt doesn't know last week vomitied and having dry heaves  9. RELIEVING/AGGRAVATING FACTORS: "What makes it better or worse?" (e.g., movement, antacids, bowel movement)     Pressing down on the area- doing nothing makes the pain better 10. OTHER SYMPTOMS: "Has there been any vomiting, diarrhea, constipation, or urine problems?"       Vomiting and dry heaves last week 11. PREGNANCY: "Is there any chance you are pregnant?" "When was your last menstrual period?"       n/a  Protocols used: ABDOMINAL PAIN - [facility]

## 2018-06-14 NOTE — Telephone Encounter (Signed)
Patient seeing Dr. Quay Burow tomorrow

## 2018-06-15 ENCOUNTER — Ambulatory Visit: Payer: Medicare Other | Admitting: Internal Medicine

## 2018-06-15 ENCOUNTER — Other Ambulatory Visit (INDEPENDENT_AMBULATORY_CARE_PROVIDER_SITE_OTHER): Payer: Medicare Other

## 2018-06-15 ENCOUNTER — Encounter: Payer: Self-pay | Admitting: Internal Medicine

## 2018-06-15 VITALS — BP 140/64 | HR 84 | Temp 97.4°F | Resp 16 | Ht 64.75 in | Wt 163.8 lb

## 2018-06-15 DIAGNOSIS — R1032 Left lower quadrant pain: Secondary | ICD-10-CM

## 2018-06-15 DIAGNOSIS — R1110 Vomiting, unspecified: Secondary | ICD-10-CM | POA: Insufficient documentation

## 2018-06-15 DIAGNOSIS — R112 Nausea with vomiting, unspecified: Secondary | ICD-10-CM | POA: Diagnosis not present

## 2018-06-15 DIAGNOSIS — D72829 Elevated white blood cell count, unspecified: Secondary | ICD-10-CM | POA: Insufficient documentation

## 2018-06-15 LAB — URINALYSIS, ROUTINE W REFLEX MICROSCOPIC
Bilirubin Urine: NEGATIVE
KETONES UR: NEGATIVE
Leukocytes, UA: NEGATIVE
Nitrite: NEGATIVE
RBC / HPF: NONE SEEN (ref 0–?)
Specific Gravity, Urine: <=1.005 — AB (ref 1.000–1.030)
TOTAL PROTEIN, URINE-UPE24: NEGATIVE
URINE GLUCOSE: NEGATIVE
Urobilinogen, UA: 0.2 (ref 0.0–1.0)
pH: 7 (ref 5.0–8.0)

## 2018-06-15 LAB — COMPREHENSIVE METABOLIC PANEL
ALBUMIN: 3.9 g/dL (ref 3.5–5.2)
ALT: 14 U/L (ref 0–35)
AST: 18 U/L (ref 0–37)
Alkaline Phosphatase: 60 U/L (ref 39–117)
BUN: 8 mg/dL (ref 6–23)
CO2: 30 mEq/L (ref 19–32)
Calcium: 9.6 mg/dL (ref 8.4–10.5)
Chloride: 105 mEq/L (ref 96–112)
Creatinine, Ser: 0.68 mg/dL (ref 0.40–1.20)
GFR: 82.47 mL/min (ref >60.00)
Glucose, Bld: 86 mg/dL (ref 70–99)
Potassium: 4.2 mEq/L (ref 3.5–5.1)
Sodium: 140 mEq/L (ref 135–145)
Total Bilirubin: 0.6 mg/dL (ref 0.2–1.2)
Total Protein: 7.3 g/dL (ref 6.0–8.3)

## 2018-06-15 LAB — CBC WITH DIFFERENTIAL/PLATELET
Basophils Absolute: 0 10*3/uL (ref 0.0–0.1)
Basophils Relative: 0.6 % (ref 0.0–3.0)
EOS ABS: 0.1 10*3/uL (ref 0.0–0.7)
Eosinophils Relative: 1.7 % (ref 0.0–5.0)
HCT: 39.7 % (ref 36.0–46.0)
Hemoglobin: 13.3 g/dL (ref 12.0–15.0)
LYMPHS PCT: 20.3 % (ref 12.0–46.0)
Lymphs Abs: 1.6 10*3/uL (ref 0.7–4.0)
MCHC: 33.6 g/dL (ref 30.0–36.0)
MCV: 91.1 fl (ref 78.0–100.0)
Monocytes Absolute: 0.7 10*3/uL (ref 0.1–1.0)
Monocytes Relative: 9.4 % (ref 3.0–12.0)
Neutro Abs: 5.3 10*3/uL (ref 1.4–7.7)
Neutrophils Relative %: 68 % (ref 43.0–77.0)
Platelets: 221 10*3/uL (ref 150.0–400.0)
RBC: 4.35 Mil/uL (ref 3.87–5.11)
RDW: 12.8 % (ref 11.5–15.5)
WBC: 7.8 10*3/uL (ref 4.0–10.5)

## 2018-06-15 NOTE — Patient Instructions (Signed)
Give blood work and a urine sample.  We will call you with the results and discuss treatment and further evaluation if needed.

## 2018-06-15 NOTE — Assessment & Plan Note (Signed)
Has had intermittent left lower quadrant pain for 2 weeks Tender in suprapubic region and left lower quadrant without rebound or guarding No change in bowels Has had nausea and some vomiting Slight elevated white blood cell count last week-?  Significant or not Will check CBC, CMP, urinalysis and urine culture-need to rule out a UTI May need CT the abdomen and pelvis depending on above results

## 2018-06-15 NOTE — Assessment & Plan Note (Signed)
Having intermittent nausea over the past 2 weeks and has vomited twice Started with left lower quadrant abdominal pain so was likely related CBC, CMP, urinalysis and urine culture

## 2018-06-15 NOTE — Assessment & Plan Note (Signed)
Mildly elevated last week-may or may not be significant Repeat CBC today Rule out possible infection given left lower quadrant/suprapubic pain, nausea/vomiting

## 2018-06-16 LAB — URINE CULTURE
MICRO NUMBER:: 83154
SPECIMEN QUALITY:: ADEQUATE

## 2018-06-17 ENCOUNTER — Other Ambulatory Visit: Payer: Self-pay | Admitting: Internal Medicine

## 2018-06-17 DIAGNOSIS — R1032 Left lower quadrant pain: Secondary | ICD-10-CM

## 2018-06-23 ENCOUNTER — Ambulatory Visit (INDEPENDENT_AMBULATORY_CARE_PROVIDER_SITE_OTHER)
Admission: RE | Admit: 2018-06-23 | Discharge: 2018-06-23 | Disposition: A | Discharge status: Home / Self Care | Payer: Medicare Other | Source: Ambulatory Visit | Attending: Internal Medicine | Admitting: Internal Medicine

## 2018-06-23 DIAGNOSIS — R1032 Left lower quadrant pain: Secondary | ICD-10-CM

## 2018-06-23 MED ORDER — IOPAMIDOL (ISOVUE-300) INJECTION 61%
100.0000 mL | Freq: Once | INTRAVENOUS | Status: AC | PRN
  Administered 2018-06-23: 100 mL via INTRAVENOUS

## 2018-07-11 ENCOUNTER — Emergency Department (HOSPITAL_BASED_OUTPATIENT_CLINIC_OR_DEPARTMENT_OTHER): Payer: Medicare Other

## 2018-07-11 ENCOUNTER — Emergency Department (HOSPITAL_BASED_OUTPATIENT_CLINIC_OR_DEPARTMENT_OTHER)
Admission: EM | Admit: 2018-07-11 | Discharge: 2018-07-11 | Disposition: A | Discharge status: Home / Self Care | Payer: Medicare Other | Attending: Emergency Medicine | Admitting: Emergency Medicine

## 2018-07-11 ENCOUNTER — Other Ambulatory Visit: Payer: Self-pay

## 2018-07-11 ENCOUNTER — Encounter (HOSPITAL_BASED_OUTPATIENT_CLINIC_OR_DEPARTMENT_OTHER): Payer: Self-pay | Admitting: Emergency Medicine

## 2018-07-11 DIAGNOSIS — Y999 Unspecified external cause status: Secondary | ICD-10-CM | POA: Diagnosis not present

## 2018-07-11 DIAGNOSIS — I110 Hypertensive heart disease with heart failure: Secondary | ICD-10-CM | POA: Diagnosis not present

## 2018-07-11 DIAGNOSIS — Z7901 Long term (current) use of anticoagulants: Secondary | ICD-10-CM | POA: Diagnosis not present

## 2018-07-11 DIAGNOSIS — I5022 Chronic systolic (congestive) heart failure: Secondary | ICD-10-CM | POA: Diagnosis not present

## 2018-07-11 DIAGNOSIS — S82832A Other fracture of upper and lower end of left fibula, initial encounter for closed fracture: Secondary | ICD-10-CM | POA: Diagnosis not present

## 2018-07-11 DIAGNOSIS — Z853 Personal history of malignant neoplasm of breast: Secondary | ICD-10-CM | POA: Insufficient documentation

## 2018-07-11 DIAGNOSIS — S99912A Unspecified injury of left ankle, initial encounter: Secondary | ICD-10-CM | POA: Diagnosis present

## 2018-07-11 DIAGNOSIS — Y939 Activity, unspecified: Secondary | ICD-10-CM | POA: Insufficient documentation

## 2018-07-11 DIAGNOSIS — Y929 Unspecified place or not applicable: Secondary | ICD-10-CM | POA: Diagnosis not present

## 2018-07-11 DIAGNOSIS — W1830XA Fall on same level, unspecified, initial encounter: Secondary | ICD-10-CM | POA: Diagnosis not present

## 2018-07-11 MED ORDER — ACETAMINOPHEN 325 MG PO TABS
650.0000 mg | ORAL_TABLET | Freq: Once | ORAL | Status: AC
  Administered 2018-07-11: 650 mg via ORAL
  Filled 2018-07-11: qty 2

## 2018-07-11 NOTE — ED Notes (Signed)
Pt and family understood dc material. Covered follow up information and when to call. NAD noted. All questions answered to satisfaction.

## 2018-07-11 NOTE — Discharge Instructions (Addendum)
Do not bear weight on your left lower extremity  Wear the cam walker device at all times, except when showering  Please call the orthopedic surgery office in the morning for follow-up  Please return to the emergency department for any new or worsening symptoms.  Elevate your leg and take anti-inflammatories for the pain

## 2018-07-11 NOTE — ED Triage Notes (Signed)
Pts daughter states pt had unwitnessed fall on Friday night. Pt states she "thinks" she fell in her bedroom. Pt c/o head tenderness to touch and left foot pain. Pt alert and oriented x 4. PT takes xarelto for afib

## 2018-07-12 ENCOUNTER — Telehealth: Payer: Self-pay | Admitting: *Deleted

## 2018-07-12 NOTE — Telephone Encounter (Signed)
Pt's dtr, [name] states pt broke her ankle yesterday and she would like to know if Dr. Milinda Pointer would review the x-rays and let them know what needed to be done. [facility] in [facility] performed the x-rays.

## 2018-07-12 NOTE — Telephone Encounter (Signed)
I informed Pamela Whitaker, if I was able to view the x-rays in office, then Dr. Milinda Pointer would be able to evaluate at pt's appt time and transferred to her to schedulers.

## 2018-07-15 ENCOUNTER — Other Ambulatory Visit: Payer: Self-pay | Admitting: Cardiovascular Disease

## 2018-07-15 ENCOUNTER — Encounter: Payer: Self-pay | Admitting: Podiatry

## 2018-07-15 ENCOUNTER — Ambulatory Visit: Payer: Medicare Other | Admitting: Podiatry

## 2018-07-15 DIAGNOSIS — S8265XA Nondisplaced fracture of lateral malleolus of left fibula, initial encounter for closed fracture: Secondary | ICD-10-CM

## 2018-07-15 NOTE — Progress Notes (Signed)
She presents today for fracture of the lateral ankle she fell 6 days ago is bruised and swollen and sore she went to the ER they x-rayed it and placed her in a cam walker.  Objective: Vital signs are stable alert and oriented x3.  Pulses are palpable.  Left lower extremity is swollen and ecchymotic.  She has swelling with ecchymosis overlying the lateral aspect of the ankle and foot.  She has no pain proximal tibia and fibula just of distal fibula.  Radiographs were reviewed demonstrating a fractured but nondisplaced distal fibula.  Assessment: Fracture nondisplaced distal tibia.  Plan: Weightbearing cam walker utilizing her walker she should be able to do this just fine.  I will follow-up with her in 1 month with a set of x-rays.

## 2018-07-16 NOTE — ED Provider Notes (Signed)
[facility] EMERGENCY DEPARTMENT Provider Note   CSN: [id number] Arrival date & time: 07/11/18  1647    History   Chief Complaint Chief Complaint  Patient presents with  . Fall    HPI Pamela Whitaker is a 85 y.o. female.     HPI Patient is an 85 year old female presents to the emergency department after an unwitnessed fall 2 days ago.  Presents complaining of mild tenderness to the left side of the head and left foot pain.  No weakness of her arms or legs.  She is on anticoagulation.  She denies significant headache at this time.  No altered mental status.  No abnormal speech.  Denies neck pain.  No chest or abdominal pain.  Pain in her left lateral malleolus.  Unable to bear weight secondary to pain.  Denies pain in her hips.   Past Medical History:  Diagnosis Date  . Atrial fibrillation ([place])    treated with multaq x 6 mos in 2011....[name]=2 (Age; + CHF; no CVA; no DM2). Echo 05/24/10: EF 30% mild AS(mean 20mmgHg); mild LAE; mod LAE; PASP 35 Systolic CHF  . Breast cancer ([place])     in 1987 with a left  mastectomy  . Cardiomyopathy    a. tachy mediated Myoview 06/06/10:  EF 31%; no scar or ischemia; EF up to 50% per echo November 2013  . Diverticulosis   . DJD (degenerative joint disease) of knee    right knee  . Falls   . Hypercholesteremia   . Hypertension   . Lumbar stenosis   . Osteoporosis    history of bone chips in the right knee,previous right fibular fracture, operated on by Dr. Veverly Fells  . RLS (restless legs syndrome)   . Vitamin D deficiency     Patient Active Problem List   Diagnosis Date Noted  . Non-intractable vomiting 06/15/2018  . LLQ abdominal pain 06/15/2018  . Leukocytosis 06/15/2018  . Osteoporosis 03/02/2017  . Urinary incontinence 10/14/2015  . Routine general medical examination at a health care facility 12/06/2014  . HX: breast cancer 06/29/2014  . Hypertension   . Vitamin D deficiency   . Spinal stenosis, lumbar region, with  neurogenic claudication 03/28/2013    Class: Chronic  . At high risk for falls 02/23/2013  . Systolic heart failure ([place]) 06/07/2010  . Congestive dilated cardiomyopathy ([place]) 05/29/2010  . Atrial fibrillation ([place]) 04/16/2009  . Elevated lipids 04/13/2009  . Osteoarthritis 04/13/2009    Past Surgical History:  Procedure Laterality Date  . BREAST RECONSTRUCTION  1988   left breast  . CARDIOVERSION  2012  . KNEE ARTHROSCOPY  2005  . MASTECTOMY, RADICAL  1987   left breast  . WRIST FRACTURE SURGERY     left wrist     OB History   No obstetric history on file.      Home Medications    Prior to Admission medications   Medication Sig Start Date End Date Taking? Authorizing Provider  acetaminophen (TYLENOL) 500 MG tablet Take 500 mg by mouth every 6 (six) hours as needed (pt takes 1-2 tablets at night for pain).    [provider]  carvedilol (COREG) 6.25 MG tablet TAKE 2 TABLETS BY MOUTH EVERY MORNING AND 1TABLET EVERY EVENING, KEEP OCTOBER APPT FOR MORE REFILLS 07/15/18   Josue Hector, MD  Cholecalciferol (VITAMIN D) 2000 UNITS tablet Take 2,000 Units by mouth daily. Does not take regularly    [provider]  mirabegron ER (MYRBETRIQ) 25  MG TB24 tablet Take 1 tablet (25 mg total) by mouth daily. 02/04/18   Hoyt Koch, MD  Multiple Vitamin (MULTIVITAMIN) capsule Take 1 capsule by mouth daily. Women's Research scientist (life sciences), Historical, MD  XARELTO 20 MG TABS tablet TAKE 1 TABLET (20 MG TOTAL) BY MOUTH DAILY WITH SUPPER. 05/05/18   Josue Hector, MD    Family History Family History  Problem Relation Age of Onset  . Heart attack Father   . Aortic aneurysm Mother   . Breast cancer Sister   . Diabetes Sister   . Stomach cancer Sister     Social History Social History   Tobacco Use  . Smoking status: Never Smoker  . Smokeless tobacco: Never Used  Substance Use Topics  . Alcohol use: No  . Drug use: No     Allergies   Amiodarone;  Lipitor [atorvastatin]; and Multaq [dronedarone]   Review of Systems Review of Systems  All other systems reviewed and are negative.    Physical Exam Updated Vital Signs BP 128/76   Pulse 86   Temp (!) 97.5 F (36.4 C) (Oral)   Resp 20   Ht 5\' 4"  (1.626 m)   Wt 68 kg   SpO2 98%   BMI 25.75 kg/m   Physical Exam Vitals signs and nursing note reviewed.  Constitutional:      General: She is not in acute distress.    Appearance: She is well-developed.  HENT:     Head: Normocephalic and atraumatic.  Neck:     Musculoskeletal: Normal range of motion.  Cardiovascular:     Rate and Rhythm: Normal rate and regular rhythm.     Heart sounds: Normal heart sounds.  Pulmonary:     Effort: Pulmonary effort is normal.     Breath sounds: Normal breath sounds.  Abdominal:     General: There is no distension.     Palpations: Abdomen is soft.     Tenderness: There is no abdominal tenderness.  Musculoskeletal: Normal range of motion.     Comments: Tenderness and swelling of the left lateral malleolus without significant deformity.  Normal pulses left foot.  Compartments left lower extremity are soft.  Skin:    General: Skin is warm and dry.  Neurological:     Mental Status: She is alert and oriented to person, place, and time.  Psychiatric:        Judgment: Judgment normal.      ED Treatments / Results  Labs (all labs ordered are listed, but only abnormal results are displayed) Labs Reviewed - No data to display  EKG None  Radiology No results found.  Procedures Procedures (including critical care time)  Medications Ordered in ED Medications  acetaminophen (TYLENOL) tablet 650 mg (650 mg Oral Given 07/11/18 1750)     Initial Impression / Assessment and Plan / ED Course  I have reviewed the triage vital signs and the nursing notes.  Pertinent labs & imaging results that were available during my care of the patient were reviewed by me and considered in my medical  decision making (see chart for details).        Distal left fibula fracture.  Nonweightbearing status in the left lower extremity.  Orthopedic follow-up.  Splinted in the ER.  In regards to her minor head injury.  She is on anticoagulation but she is now 48 hours out from the injury without altered mental status or significant headache.  No vomiting.  No weakness of  her arms or legs.  No indication for CT imaging at this time.  Patient family instructed to return to the ER for new or worsening symptoms  Final Clinical Impressions(s) / ED Diagnoses   Final diagnoses:  Closed fracture of distal end of left fibula, unspecified fracture morphology, initial encounter    ED Discharge Orders    None       Jola Schmidt, MD 07/16/18 [phone]

## 2018-07-26 ENCOUNTER — Telehealth: Payer: Self-pay | Admitting: Podiatry

## 2018-07-26 DIAGNOSIS — Z9181 History of falling: Secondary | ICD-10-CM

## 2018-07-26 DIAGNOSIS — S8265XA Nondisplaced fracture of lateral malleolus of left fibula, initial encounter for closed fracture: Secondary | ICD-10-CM

## 2018-07-26 DIAGNOSIS — M76829 Posterior tibial tendinitis, unspecified leg: Secondary | ICD-10-CM

## 2018-07-26 NOTE — Telephone Encounter (Signed)
Yes that will be fine. 

## 2018-07-26 NOTE — Telephone Encounter (Signed)
Pts daughter called requesting that we fax over an order to [place] for the patient to receive a wheelchair. Fax# [phone]

## 2018-07-27 NOTE — Telephone Encounter (Signed)
Faxed orders for the wheelchair to [place] and emailed to J. [facility].

## 2018-07-27 NOTE — Addendum Note (Signed)
Addended by: Harriett Sine D on: 07/27/2018 09:10 AM   Modules accepted: Orders

## 2018-08-17 ENCOUNTER — Ambulatory Visit: Payer: Medicare Other | Admitting: Podiatry

## 2018-08-17 ENCOUNTER — Ambulatory Visit (INDEPENDENT_AMBULATORY_CARE_PROVIDER_SITE_OTHER): Payer: Medicare Other

## 2018-08-17 ENCOUNTER — Other Ambulatory Visit: Payer: Self-pay

## 2018-08-17 DIAGNOSIS — S8265XD Nondisplaced fracture of lateral malleolus of left fibula, subsequent encounter for closed fracture with routine healing: Secondary | ICD-10-CM

## 2018-08-17 NOTE — Progress Notes (Signed)
She presents today for a chief follow-up of her fractured fibula left.  States that it really has not bothered her and she has been up on it walking around with her boot on utilizing her walker.  Objective: Vital signs are stable alert and oriented x3.  Pulses are palpable.  No erythema edema cellulitis drainage or odor.  Radiographs taken today demonstrate a healing fibula no change in position.  Assessment: Well-healing distal fibula left.  Plan: Instructed her to continue current therapies ambulating in the boot is fine to do and I will follow-up with her in about 1 month for another set of x-rays.

## 2018-09-02 ENCOUNTER — Other Ambulatory Visit (HOSPITAL_COMMUNITY): Payer: Self-pay | Admitting: *Deleted

## 2018-09-02 DIAGNOSIS — D72829 Elevated white blood cell count, unspecified: Secondary | ICD-10-CM

## 2018-09-03 ENCOUNTER — Other Ambulatory Visit (HOSPITAL_COMMUNITY): Payer: Medicare Other

## 2018-09-03 ENCOUNTER — Ambulatory Visit (HOSPITAL_COMMUNITY): Payer: Medicare Other | Admitting: Hematology

## 2018-09-06 ENCOUNTER — Other Ambulatory Visit (HOSPITAL_COMMUNITY): Payer: Medicare Other

## 2018-09-06 ENCOUNTER — Ambulatory Visit (HOSPITAL_COMMUNITY): Payer: Medicare Other | Admitting: Hematology

## 2018-09-21 ENCOUNTER — Encounter: Payer: Self-pay | Admitting: Podiatry

## 2018-09-21 ENCOUNTER — Other Ambulatory Visit: Payer: Self-pay

## 2018-09-21 ENCOUNTER — Ambulatory Visit (INDEPENDENT_AMBULATORY_CARE_PROVIDER_SITE_OTHER): Payer: Medicare Other

## 2018-09-21 ENCOUNTER — Ambulatory Visit: Payer: Medicare Other | Admitting: Podiatry

## 2018-09-21 DIAGNOSIS — S8265XD Nondisplaced fracture of lateral malleolus of left fibula, subsequent encounter for closed fracture with routine healing: Secondary | ICD-10-CM

## 2018-09-21 NOTE — Progress Notes (Signed)
She presents today with her daughter for follow-up of her fracture nondisplaced fibula left.  She states that it seems to be doing a little bit better.  Objective: Vital signs are stable she is alert and oriented x3.  Pulses are palpable.  Neurologic sensorium is intact dT reflexes are intact minimal edema.  She still has tenderness on direct palpation of the fibular fracture.  Radiographs taken today demonstrate a slowly healing fibular fracture remaining nondisplaced calcified arteries noted.  Assessment: Slowly healing distal fibula fracture remains nondisplaced.  Plan: Continue the use of the cam walker and her walker while at home I will follow-up with her in another month for reevaluation.  At this point I feel that she may be approximately 50 to 60% healed.

## 2018-10-12 ENCOUNTER — Telehealth: Payer: Self-pay | Admitting: Podiatry

## 2018-10-12 NOTE — Telephone Encounter (Signed)
I called pt's dtr, Jerrye Beavers and she states she would like someone to visit pt daily, assist with meals, and acts of daily living.

## 2018-10-12 NOTE — Telephone Encounter (Signed)
[facility] states they do provide the services pt's family is requesting and I gave him Marty's phone.

## 2018-10-12 NOTE — Telephone Encounter (Signed)
Pts daughter called stating that the patient has been staying with her since she fractured her foot but is about to go back home and would like to get some information regarding home health. Please give her a call.

## 2018-10-12 NOTE — Telephone Encounter (Signed)
I informed pt's dtr, Jerrye Beavers of [facility] number and they do provide the services she is requesting.

## 2018-10-19 ENCOUNTER — Other Ambulatory Visit: Payer: Self-pay

## 2018-10-19 ENCOUNTER — Inpatient Hospital Stay (HOSPITAL_COMMUNITY): Payer: Medicare Other | Attending: Hematology

## 2018-10-19 DIAGNOSIS — Z853 Personal history of malignant neoplasm of breast: Secondary | ICD-10-CM | POA: Insufficient documentation

## 2018-10-19 DIAGNOSIS — M810 Age-related osteoporosis without current pathological fracture: Secondary | ICD-10-CM | POA: Insufficient documentation

## 2018-10-19 DIAGNOSIS — D72829 Elevated white blood cell count, unspecified: Secondary | ICD-10-CM

## 2018-10-19 LAB — COMPREHENSIVE METABOLIC PANEL
ALT: 15 U/L (ref 0–44)
AST: 18 U/L (ref 15–41)
Albumin: 3.6 g/dL (ref 3.5–5.0)
Alkaline Phosphatase: 64 U/L (ref 38–126)
Anion gap: 7 (ref 5–15)
BUN: 13 mg/dL (ref 8–23)
CO2: 27 mmol/L (ref 22–32)
Calcium: 9.2 mg/dL (ref 8.9–10.3)
Chloride: 106 mmol/L (ref 98–111)
Creatinine, Ser: 0.72 mg/dL (ref 0.44–1.00)
GFR calc Af Amer: >60 mL/min (ref >60)
GFR calc non Af Amer: >60 mL/min (ref >60)
Glucose, Bld: 83 mg/dL (ref 70–99)
Potassium: 4 mmol/L (ref 3.5–5.1)
Sodium: 140 mmol/L (ref 135–145)
Total Bilirubin: 0.7 mg/dL (ref 0.3–1.2)
Total Protein: 7 g/dL (ref 6.5–8.1)

## 2018-10-19 LAB — CBC WITH DIFFERENTIAL/PLATELET
Abs Immature Granulocytes: 0.02 10*3/uL (ref 0.00–0.07)
Basophils Absolute: 0.1 10*3/uL (ref 0.0–0.1)
Basophils Relative: 1 %
Eosinophils Absolute: 0.2 10*3/uL (ref 0.0–0.5)
Eosinophils Relative: 2 %
HCT: 40.4 % (ref 36.0–46.0)
Hemoglobin: 13.4 g/dL (ref 12.0–15.0)
Immature Granulocytes: 0 %
Lymphocytes Relative: 23 %
Lymphs Abs: 1.7 10*3/uL (ref 0.7–4.0)
MCH: 30.9 pg (ref 26.0–34.0)
MCHC: 33.2 g/dL (ref 30.0–36.0)
MCV: 93.1 fL (ref 80.0–100.0)
Monocytes Absolute: 0.8 10*3/uL (ref 0.1–1.0)
Monocytes Relative: 11 %
Neutro Abs: 4.7 10*3/uL (ref 1.7–7.7)
Neutrophils Relative %: 63 %
Platelets: 179 10*3/uL (ref 150–400)
RBC: 4.34 MIL/uL (ref 3.87–5.11)
RDW: 12.4 % (ref 11.5–15.5)
WBC: 7.4 10*3/uL (ref 4.0–10.5)
nRBC: 0 % (ref 0.0–0.2)

## 2018-10-20 ENCOUNTER — Other Ambulatory Visit (HOSPITAL_COMMUNITY): Payer: Self-pay | Admitting: Internal Medicine

## 2018-10-20 DIAGNOSIS — Z1231 Encounter for screening mammogram for malignant neoplasm of breast: Secondary | ICD-10-CM

## 2018-10-21 ENCOUNTER — Ambulatory Visit: Payer: Medicare Other | Admitting: Podiatry

## 2018-10-21 ENCOUNTER — Telehealth: Payer: Self-pay | Admitting: *Deleted

## 2018-10-21 ENCOUNTER — Ambulatory Visit (INDEPENDENT_AMBULATORY_CARE_PROVIDER_SITE_OTHER): Payer: Medicare Other

## 2018-10-21 ENCOUNTER — Encounter: Payer: Self-pay | Admitting: Podiatry

## 2018-10-21 ENCOUNTER — Other Ambulatory Visit: Payer: Self-pay

## 2018-10-21 DIAGNOSIS — S8265XD Nondisplaced fracture of lateral malleolus of left fibula, subsequent encounter for closed fracture with routine healing: Secondary | ICD-10-CM | POA: Diagnosis not present

## 2018-10-21 NOTE — Telephone Encounter (Signed)
-----   Message from [place] sent at 10/21/2018 11:16 AM EDT ----- Regarding: Home Health Referral Patient requesting home health for assistance in home care needs. Dr. Milinda Pointer wants to know if you can find someone that can help her with these needs.

## 2018-10-21 NOTE — Telephone Encounter (Signed)
Left message informing dtr, Jenny Reichmann I had spoken with her sister concerning [facility] aide, and that for insurance to cover there would need to be a skilled nursing order like wound care, or osteotomy care, or it would be self-pay.

## 2018-10-21 NOTE — Telephone Encounter (Signed)
Left message for dtr, Jerrye Beavers to call with the status of the search for the Home Health aide.

## 2018-10-21 NOTE — Telephone Encounter (Signed)
Jenny Reichmann called and stated her sister and she are trying to get pt to stay with them at least until the COVID-19 is over. I told Jenny Reichmann the only other agency I knew of with companion/sitter-type aid is [facility].

## 2018-10-21 NOTE — Telephone Encounter (Signed)
-----   Message from [place] sent at 10/21/2018 11:16 AM EDT ----- Regarding: Home Health Referral Patient requesting home health for assistance in home care needs. Dr. Milinda Pointer wants to know if you can find someone that can help her with these needs.

## 2018-10-21 NOTE — Progress Notes (Addendum)
She presents today for follow-up of her fractured nondisplaced fibula states that I do not even feel that it feels great to me.  Objective: Vital signs are stable alert and oriented x3.  Pulses are palpable.  Neurologic sensorium is intact she has no pain on palpation of the fibula there is still overlying edema present.  It is minimal and radiographs do demonstrate a well-healing distal fibula.  Assessment: Resolving fibular fracture.  Plan: Discussed etiology pathology conservative surgical therapies at this point we are going go ahead and lower her back into her tennis shoe and utilizing her walker.  She understands that she is only allowed to walk without the boot with the walker.  We are going to write a prescription for a wheelchair.  We also are requesting home health to come out and evaluate her on a regular basis to make sure she is doing well. Wheelchair is medically necessary for offloading the fibular fracture. She is to unstable to utilize knee scooter and crutches.

## 2018-10-25 ENCOUNTER — Inpatient Hospital Stay (HOSPITAL_COMMUNITY): Payer: Medicare Other | Attending: Hematology | Admitting: Hematology

## 2018-10-25 ENCOUNTER — Encounter (HOSPITAL_COMMUNITY): Payer: Self-pay | Admitting: Hematology

## 2018-10-25 ENCOUNTER — Other Ambulatory Visit: Payer: Self-pay

## 2018-10-25 DIAGNOSIS — M810 Age-related osteoporosis without current pathological fracture: Secondary | ICD-10-CM | POA: Diagnosis not present

## 2018-10-25 DIAGNOSIS — Z9012 Acquired absence of left breast and nipple: Secondary | ICD-10-CM | POA: Insufficient documentation

## 2018-10-25 DIAGNOSIS — Z853 Personal history of malignant neoplasm of breast: Secondary | ICD-10-CM | POA: Diagnosis not present

## 2018-10-25 NOTE — Patient Instructions (Signed)
[place] at [facility] Discharge Instructions  You were seen today by Dr. Delton Coombes. He went over your recent lab results. He will see you back in 6 months for labs and follow up. You have severe osteoporosis, he discussed starting you on a shot to help with this called Prolia. You will take the shot every 6 months.    Thank you for choosing [facility] at [facility] to provide your oncology and hematology care.  To afford each patient quality time with our provider, please arrive at least 15 minutes before your scheduled appointment time.   If you have a lab appointment with the [facility] please come in thru the  Main Entrance and check in at the main information desk  You need to re-schedule your appointment should you arrive 10 or more minutes late.  We strive to give you quality time with our providers, and arriving late affects you and other patients whose appointments are after yours.  Also, if you no show three or more times for appointments you may be dismissed from the clinic at the providers discretion.     Again, thank you for choosing [facility].  Our hope is that these requests will decrease the amount of time that you wait before being seen by our physicians.       _____________________________________________________________  Should you have questions after your visit to [facility], please contact our office at [phone] between the hours of 8:00 a.m. and 4:30 p.m.  Voicemails left after 4:00 p.m. will not be returned until the following business day.  For prescription refill requests, have your pharmacy contact our office and allow 72 hours.    Cancer Center Support Programs:   > Cancer Support Group  2nd Tuesday of the month 1pm-2pm, Journey Room

## 2018-10-25 NOTE — Assessment & Plan Note (Addendum)
1.  Left breast cancer: - She had stage III left breast cancer, status post left mastectomy with reconstruction done in 1987.  Tumor was 6.5 cm with 2/27 lymph nodes positive. -He underwent chemotherapy with CAF VP.  She apparently took antiestrogen therapy for 12 months.  She could not tolerate tamoxifen. - Last mammogram of the right breast was on 08/27/2017, BI-RADS Category 1. - I have reviewed her blood work which is within normal limits. -Physical examination today shows left breast prosthesis within normal limits.  No palpable masses.  Right breast has no palpable masses.  No palpable adenopathy. -We will reevaluate her in 6 months.  She is having mammogram done on the 22nd of this month.  2.  Osteoporosis: -DEXA scan on 08/27/2017 shows T score of -2.7. -She was counseled to continue calcium and vitamin D supplements. -She is reportedly not taking any Fosamax.  She broke her left ankle few months ago. -I have strongly recommended Prolia injections to prevent any further fractures.  We discussed about the side effects in detail including but not limited to rare chance of osteonecrosis of the jaw.  I have also called and talked to her daughter. -She is in agreement to start Prolia.  We will arrange it on the day of her mammogram in the last week of this month.

## 2018-10-25 NOTE — Progress Notes (Signed)
Pamela Whitaker, [place] [id number]   CLINIC:  Medical Oncology/Hematology  PCP:  Hoyt Koch, MD [place] [address] [phone]   REASON FOR VISIT:  Follow-up for left breast cancer.   INTERVAL HISTORY:  [name] 85 y.o. female is seen today for follow-up of left breast cancer.  She denies any new onset pains.  She is continuing to live independently.  She walks with the help of walker/cane.  Denies any fevers, night sweats or weight loss in the preceding 6 months.  Denies any infections or hospitalizations.  She is not taking Fosamax.  She reportedly had a left ankle fracture and the cast was removed today.  She is living with her daughter in [place].  Appetite is 75% and energy levels are 100%.  Pain is reported as 0.   REVIEW OF SYSTEMS:  Review of Systems  Constitutional: Negative for fatigue.  HENT:  Negative.   Eyes: Negative.   Respiratory: Negative.   Cardiovascular: Negative.   Gastrointestinal: Negative.   Genitourinary: Negative.    Skin: Negative.   Hematological: Negative.   Psychiatric/Behavioral: Negative.   All other systems reviewed and are negative.    PAST MEDICAL/SURGICAL HISTORY:  Past Medical History:  Diagnosis Date  . Atrial fibrillation ([place])    treated with multaq x 6 mos in 2011....[name]=2 (Age; + CHF; no CVA; no DM2). Echo 05/24/10: EF 30% mild AS(mean 68mmgHg); mild LAE; mod LAE; PASP 35 Systolic CHF  . Breast cancer ([place])     in 1987 with a left  mastectomy  . Cardiomyopathy    a. tachy mediated Myoview 06/06/10:  EF 31%; no scar or ischemia; EF up to 50% per echo November 2013  . Diverticulosis   . DJD (degenerative joint disease) of knee    right knee  . Falls   . Hypercholesteremia   . Hypertension   . Lumbar stenosis   . Osteoporosis    history of bone chips in the right knee,previous right fibular fracture, operated on by Dr. Veverly Fells  . RLS (restless legs  syndrome)   . Vitamin D deficiency    Past Surgical History:  Procedure Laterality Date  . BREAST RECONSTRUCTION  1988   left breast  . CARDIOVERSION  2012  . KNEE ARTHROSCOPY  2005  . MASTECTOMY, RADICAL  1987   left breast  . WRIST FRACTURE SURGERY     left wrist     SOCIAL HISTORY:  Social History   Socioeconomic History  . Marital status: Married    Spouse name: Chrissie Noa  . Number of children: 2  . Years of education: college  . Highest education level: Not on file  Occupational History  . Occupation: retired    Fish farm manager: RETIRED  Social Needs  . Financial resource strain: Not on file  . Food insecurity:    Worry: Not on file    Inability: Not on file  . Transportation needs:    Medical: Not on file    Non-medical: Not on file  Tobacco Use  . Smoking status: Never Smoker  . Smokeless tobacco: Never Used  Substance and Sexual Activity  . Alcohol use: No  . Drug use: No  . Sexual activity: Never  Lifestyle  . Physical activity:    Days per week: Not on file    Minutes per session: Not on file  . Stress: Not on file  Relationships  . Social connections:  Talks on phone: Not on file    Gets together: Not on file    Attends religious service: Not on file    Active member of club or organization: Not on file    Attends meetings of clubs or organizations: Not on file    Relationship status: Not on file  . Intimate partner violence:    Fear of current or ex partner: Not on file    Emotionally abused: Not on file    Physically abused: Not on file    Forced sexual activity: Not on file  Other Topics Concern  . Not on file  Social History Narrative  . Not on file    FAMILY HISTORY:  Family History  Problem Relation Age of Onset  . Heart attack Father   . Aortic aneurysm Mother   . Breast cancer Sister   . Diabetes Sister   . Stomach cancer Sister     CURRENT MEDICATIONS:  Outpatient Encounter Medications as of 10/25/2018  Medication Sig  .  acetaminophen (TYLENOL) 500 MG tablet Take 500 mg by mouth every 6 (six) hours as needed (pt takes 1-2 tablets at night for pain).  . Cholecalciferol (VITAMIN D) 2000 UNITS tablet Take 2,000 Units by mouth daily. Does not take regularly  . mirabegron ER (MYRBETRIQ) 25 MG TB24 tablet Take 1 tablet (25 mg total) by mouth daily.  . Multiple Vitamin (MULTIVITAMIN) capsule Take 1 capsule by mouth daily. [employer]  . XARELTO 20 MG TABS tablet TAKE 1 TABLET (20 MG TOTAL) BY MOUTH DAILY WITH SUPPER.  . carvedilol (COREG) 6.25 MG tablet TAKE 2 TABLETS BY MOUTH EVERY MORNING AND 1TABLET EVERY EVENING, KEEP OCTOBER APPT FOR MORE REFILLS  . [DISCONTINUED] carvedilol (COREG) 6.25 MG tablet   . [DISCONTINUED] MYRBETRIQ 25 MG TB24 tablet   . [DISCONTINUED] XARELTO 20 MG TABS tablet    No facility-administered encounter medications on file as of 10/25/2018.     ALLERGIES:  Allergies  Allergen Reactions  . Amiodarone     Stopped on her own  . Lipitor [Atorvastatin]     weakness  . Multaq [Dronedarone]     Stopped on her own     PHYSICAL EXAM:  ECOG Performance status: 1 Vitals:   10/25/18 1048  BP: (!) 143/77  Pulse: 70  Resp: 18  Temp: 97.6 F (36.4 C)  SpO2: 99%   Filed Weights   10/25/18 1048  Weight: 160 lb (72.6 kg)    Physical Exam  Constitutional: She is oriented to person, place, and time and well-developed, well-nourished, and in no distress.  HENT:  Head: Atraumatic.  Cardiovascular: Normal rate and regular rhythm.  Pulmonary/Chest: Effort normal and breath sounds normal. No respiratory distress.  Abdominal: Soft. She exhibits no distension. There is no abdominal tenderness.  Musculoskeletal:        General: No edema.  Neurological: She is alert and oriented to person, place, and time.  Skin: Skin is warm.   Left breast prosthesis in place.  No palpable masses.  No palpable adenopathy.  Right breast has no palpable masses.  LABORATORY DATA:  I have reviewed the  labs as listed.  CBC    Component Value Date/Time   WBC 7.4 10/19/2018 1537   RBC 4.34 10/19/2018 1537   HGB 13.4 10/19/2018 1537   HGB 13.4 06/10/2018 1200   HCT 40.4 10/19/2018 1537   HCT 38.6 06/10/2018 1200   PLT 179 10/19/2018 1537   PLT CANCELED 06/10/2018 1200   MCV  93.1 10/19/2018 1537   MCV 90 06/10/2018 1200   MCH 30.9 10/19/2018 1537   MCHC 33.2 10/19/2018 1537   RDW 12.4 10/19/2018 1537   RDW 12.5 06/10/2018 1200   LYMPHSABS 1.7 10/19/2018 1537   LYMPHSABS 1.6 11/04/2016 1012   MONOABS 0.8 10/19/2018 1537   EOSABS 0.2 10/19/2018 1537   EOSABS 0.1 11/04/2016 1012   BASOSABS 0.1 10/19/2018 1537   BASOSABS 0.1 11/04/2016 1012   CMP Latest Ref Rng & Units 10/19/2018 06/15/2018 06/10/2018  Glucose 70 - 99 mg/dL 83 86 105(H)  BUN 8 - 23 mg/dL 13 8 8   Creatinine 0.44 - 1.00 mg/dL 0.72 0.68 0.71  Sodium 135 - 145 mmol/L 140 140 142  Potassium 3.5 - 5.1 mmol/L 4.0 4.2 4.4  Chloride 98 - 111 mmol/L 106 105 104  CO2 22 - 32 mmol/L 27 30 23   Calcium 8.9 - 10.3 mg/dL 9.2 9.6 9.4  Total Protein 6.5 - 8.1 g/dL 7.0 7.3 -  Total Bilirubin 0.3 - 1.2 mg/dL 0.7 0.6 -  Alkaline Phos 38 - 126 U/L 64 60 -  AST 15 - 41 U/L 18 18 -  ALT 0 - 44 U/L 15 14 -       DIAGNOSTIC IMAGING:  I have independently reviewed her mammogram dated 08/27/2017 which was BI-RADS Category 1 of the right breast.     ASSESSMENT & PLAN:   HX: breast cancer 1.  Left breast cancer: - She had stage III left breast cancer, status post left mastectomy with reconstruction done in 1987.  Tumor was 6.5 cm with 2/27 lymph nodes positive. -He underwent chemotherapy with CAF VP.  She apparently took antiestrogen therapy for 12 months.  She could not tolerate tamoxifen. - Last mammogram of the right breast was on 08/27/2017, BI-RADS Category 1. - I have reviewed her blood work which is within normal limits. -Physical examination today shows left breast prosthesis within normal limits.  No palpable masses.  Right  breast has no palpable masses.  No palpable adenopathy. -We will reevaluate her in 6 months.  She is having mammogram done on the 22nd of this month.  2.  Osteoporosis: -DEXA scan on 08/27/2017 shows T score of -2.7. -She was counseled to continue calcium and vitamin D supplements. -She is reportedly not taking any Fosamax.  She broke her left ankle few months ago. -I have strongly recommended Prolia injections to prevent any further fractures.  We discussed about the side effects in detail including but not limited to rare chance of osteonecrosis of the jaw.  I have also called and talked to her daughter. -She is in agreement to start Prolia.  We will arrange it on the day of her mammogram in the last week of this month.  Total time spent is 25 minutes with more than 50% of the time spent face-to-face discussing new therapy, side effects and coordination of care.    Orders placed this encounter:  No orders of the defined types were placed in this encounter.     Derek Jack, MD [place] [phone]

## 2018-11-15 ENCOUNTER — Ambulatory Visit (HOSPITAL_COMMUNITY)
Admission: RE | Admit: 2018-11-15 | Discharge: 2018-11-15 | Disposition: A | Discharge status: Home / Self Care | Payer: Medicare Other | Source: Ambulatory Visit | Attending: Internal Medicine | Admitting: Internal Medicine

## 2018-11-15 ENCOUNTER — Other Ambulatory Visit: Payer: Self-pay

## 2018-11-15 ENCOUNTER — Ambulatory Visit (HOSPITAL_COMMUNITY): Payer: Medicare Other

## 2018-11-15 ENCOUNTER — Other Ambulatory Visit (HOSPITAL_COMMUNITY): Payer: Self-pay | Admitting: Emergency Medicine

## 2018-11-15 DIAGNOSIS — Z1231 Encounter for screening mammogram for malignant neoplasm of breast: Secondary | ICD-10-CM | POA: Insufficient documentation

## 2018-11-15 DIAGNOSIS — D72829 Elevated white blood cell count, unspecified: Secondary | ICD-10-CM

## 2018-11-18 NOTE — Progress Notes (Signed)
For review.  Please schedule follow-up with Randi or Dr. Raliegh Ip.  Please update ordering provider

## 2018-11-22 ENCOUNTER — Telehealth: Payer: Self-pay

## 2018-11-23 NOTE — Telephone Encounter (Signed)
Returned call-spoke to pt-she is going to have her son call back with the phone number to [place] so I can get the information I need to send over her wheelchair prescription.

## 2018-12-06 ENCOUNTER — Telehealth: Payer: Self-pay | Admitting: *Deleted

## 2018-12-06 DIAGNOSIS — S8265XD Nondisplaced fracture of lateral malleolus of left fibula, subsequent encounter for closed fracture with routine healing: Secondary | ICD-10-CM

## 2018-12-06 NOTE — Telephone Encounter (Signed)
Note has been addended.

## 2018-12-06 NOTE — Telephone Encounter (Signed)
Pt's son-in-law, Elta Guadeloupe states he needs a letter from Dr. Milinda Pointer, faxed to [place].

## 2018-12-07 NOTE — Addendum Note (Signed)
Addended by: Harriett Sine D on: 12/07/2018 12:06 PM   Modules accepted: Orders

## 2018-12-07 NOTE — Telephone Encounter (Signed)
Faxed orders/required form for the wheelchair, LOV clinical and demographics to [place], and emailed to M. Stenson and A. Catron. I informed pt's son-in-law, Elta Guadeloupe.

## 2018-12-08 NOTE — Telephone Encounter (Signed)
DONE

## 2018-12-08 NOTE — Addendum Note (Signed)
Addended by: Harriett Sine D on: 12/08/2018 02:29 PM   Modules accepted: Orders

## 2018-12-09 ENCOUNTER — Encounter: Payer: Self-pay | Admitting: Podiatry

## 2018-12-09 ENCOUNTER — Ambulatory Visit (INDEPENDENT_AMBULATORY_CARE_PROVIDER_SITE_OTHER): Payer: Medicare Other | Admitting: Podiatry

## 2018-12-09 ENCOUNTER — Ambulatory Visit (INDEPENDENT_AMBULATORY_CARE_PROVIDER_SITE_OTHER): Payer: Medicare Other

## 2018-12-09 ENCOUNTER — Other Ambulatory Visit: Payer: Self-pay

## 2018-12-09 VITALS — Temp 97.9°F

## 2018-12-09 DIAGNOSIS — S8265XD Nondisplaced fracture of lateral malleolus of left fibula, subsequent encounter for closed fracture with routine healing: Secondary | ICD-10-CM

## 2018-12-10 ENCOUNTER — Telehealth: Payer: Self-pay | Admitting: *Deleted

## 2018-12-10 DIAGNOSIS — Z9181 History of falling: Secondary | ICD-10-CM

## 2018-12-10 DIAGNOSIS — S8265XD Nondisplaced fracture of lateral malleolus of left fibula, subsequent encounter for closed fracture with routine healing: Secondary | ICD-10-CM

## 2018-12-10 DIAGNOSIS — M76829 Posterior tibial tendinitis, unspecified leg: Secondary | ICD-10-CM

## 2018-12-10 NOTE — Telephone Encounter (Signed)
Dr Milinda Pointer took that info with him-

## 2018-12-10 NOTE — Telephone Encounter (Signed)
-----   Message from Rip Harbour, [facility] sent at 12/09/2018  1:56 PM EDT ----- Regarding: PT PT - needs balance and gait training  Dr. Milinda Pointer will include in note today regarding wheelchair use (the paper you gave me earlier) - needs for 1 year

## 2018-12-10 NOTE — Telephone Encounter (Signed)
Orders to [facility] - In-office.

## 2018-12-11 ENCOUNTER — Encounter: Payer: Self-pay | Admitting: Podiatry

## 2018-12-11 NOTE — Progress Notes (Signed)
She presents today with her son in a wheelchair for follow-up of her distal fibular fracture closed nondisplaced.  She states that it feels much better and then she can walk a little bit however she feels that her balance is off.  She states that she just has no strength since she has been in the wheelchair for a long time and sometimes the wheelchair is the only way that she can get around.  She states that she is easily able to manipulate the wheelchair with her arms.  Objective: Vital signs are stable she is alert and oriented x3.  Pulses are palpable.  No edema left in the left ankle and leg.  Mild tenderness on palpation of the distal fibula.  Radiographs taken today demonstrate complete healing of the fibula itself.  Ankle is in good position.  There is mild to moderate osteopenia.  Assessment: Fall risk secondary to imbalance and weakness.  Well-healing fracture distal fibula left.  Plan: Discussed etiology pathology conservative versus surgical therapies.  At this point I feel is necessary to keep the wheelchair and make it available to her at all times she needs to have this permanently particularly since she is able to get around and it with the use of her arms.  I would like her to also be evaluated for fall risk with physical therapy and consider a more balance brace on the left foot she already wears an [place] brace on the right foot.  I would like physical therapy for strengthening and balance training.  I would like to follow-up with her once this is completed.

## 2018-12-13 NOTE — Telephone Encounter (Signed)
Chart note from visit on 7/16 was faxed to [place]

## 2019-02-07 ENCOUNTER — Encounter: Payer: Self-pay | Admitting: Internal Medicine

## 2019-02-07 ENCOUNTER — Ambulatory Visit (INDEPENDENT_AMBULATORY_CARE_PROVIDER_SITE_OTHER): Payer: Medicare Other | Admitting: Internal Medicine

## 2019-02-07 ENCOUNTER — Other Ambulatory Visit (INDEPENDENT_AMBULATORY_CARE_PROVIDER_SITE_OTHER): Payer: Medicare Other

## 2019-02-07 ENCOUNTER — Other Ambulatory Visit: Payer: Self-pay

## 2019-02-07 VITALS — BP 130/90 | HR 88 | Temp 98.2°F | Ht 64.0 in | Wt 160.0 lb

## 2019-02-07 DIAGNOSIS — Z23 Encounter for immunization: Secondary | ICD-10-CM | POA: Diagnosis not present

## 2019-02-07 DIAGNOSIS — Z0000 Encounter for general adult medical examination without abnormal findings: Secondary | ICD-10-CM | POA: Diagnosis not present

## 2019-02-07 DIAGNOSIS — I4891 Unspecified atrial fibrillation: Secondary | ICD-10-CM | POA: Diagnosis not present

## 2019-02-07 DIAGNOSIS — I10 Essential (primary) hypertension: Secondary | ICD-10-CM

## 2019-02-07 LAB — COMPREHENSIVE METABOLIC PANEL
ALT: 12 U/L (ref 0–35)
AST: 16 U/L (ref 0–37)
Albumin: 3.8 g/dL (ref 3.5–5.2)
Alkaline Phosphatase: 59 U/L (ref 39–117)
BUN: 10 mg/dL (ref 6–23)
CO2: 28 mEq/L (ref 19–32)
Calcium: 9.4 mg/dL (ref 8.4–10.5)
Chloride: 106 mEq/L (ref 96–112)
Creatinine, Ser: 0.62 mg/dL (ref 0.40–1.20)
GFR: 91.6 mL/min (ref >60.00)
Glucose, Bld: 88 mg/dL (ref 70–99)
Potassium: 3.8 mEq/L (ref 3.5–5.1)
Sodium: 141 mEq/L (ref 135–145)
Total Bilirubin: 0.6 mg/dL (ref 0.2–1.2)
Total Protein: 6.7 g/dL (ref 6.0–8.3)

## 2019-02-07 LAB — CBC
HCT: 36 % (ref 36.0–46.0)
Hemoglobin: 12.3 g/dL (ref 12.0–15.0)
MCHC: 34 g/dL (ref 30.0–36.0)
MCV: 91.5 fl (ref 78.0–100.0)
Platelets: 194 10*3/uL (ref 150.0–400.0)
RBC: 3.93 Mil/uL (ref 3.87–5.11)
RDW: 13.4 % (ref 11.5–15.5)
WBC: 7 10*3/uL (ref 4.0–10.5)

## 2019-02-07 LAB — LIPID PANEL
Cholesterol: 194 mg/dL (ref 0–200)
HDL: 41.5 mg/dL (ref >39.00)
NonHDL: 152.47
Total CHOL/HDL Ratio: 5
Triglycerides: 206 mg/dL — ABNORMAL HIGH (ref 0.0–149.0)
VLDL: 41.2 mg/dL — ABNORMAL HIGH (ref 0.0–40.0)

## 2019-02-07 LAB — BRAIN NATRIURETIC PEPTIDE: Pro B Natriuretic peptide (BNP): 218 pg/mL — ABNORMAL HIGH (ref 0.0–100.0)

## 2019-02-07 LAB — HEMOGLOBIN A1C: Hgb A1c MFr Bld: 5.5 % (ref 4.6–6.5)

## 2019-02-07 LAB — VITAMIN B12: Vitamin B-12: 453 pg/mL (ref 211–911)

## 2019-02-07 LAB — LDL CHOLESTEROL, DIRECT: Direct LDL: 117 mg/dL

## 2019-02-07 LAB — TSH: TSH: 3.18 u[IU]/mL (ref 0.35–4.50)

## 2019-02-07 MED ORDER — FAMOTIDINE 40 MG PO TABS: 40.0000 mg | ORAL_TABLET | Freq: Every day | ORAL | 1 refills | Status: DC

## 2019-02-07 NOTE — Patient Instructions (Signed)
We can send in pepcid to try if you want for night time cough. Take 1 pill at least 30 minutes before bedtime.   We are checking the labs to check for other causes of the coughing.    Health Maintenance, Female Adopting a healthy lifestyle and getting preventive care are important in promoting health and wellness. Ask your health care provider about:  The right schedule for you to have regular tests and exams.  Things you can do on your own to prevent diseases and keep yourself healthy. What should I know about diet, weight, and exercise? Eat a healthy diet   Eat a diet that includes plenty of vegetables, fruits, low-fat dairy products, and lean protein.  Do not eat a lot of foods that are high in solid fats, added sugars, or sodium. Maintain a healthy weight Body mass index (BMI) is used to identify weight problems. It estimates body fat based on height and weight. Your health care provider can help determine your BMI and help you achieve or maintain a healthy weight. Get regular exercise Get regular exercise. This is one of the most important things you can do for your health. Most adults should:  Exercise for at least 150 minutes each week. The exercise should increase your heart rate and make you sweat (moderate-intensity exercise).  Do strengthening exercises at least twice a week. This is in addition to the moderate-intensity exercise.  Spend less time sitting. Even light physical activity can be beneficial. Watch cholesterol and blood lipids Have your blood tested for lipids and cholesterol at 83 years of age, then have this test every 5 years. Have your cholesterol levels checked more often if:  Your lipid or cholesterol levels are high.  You are older than 83 years of age.  You are at high risk for heart disease. What should I know about cancer screening? Depending on your health history and family history, you may need to have cancer screening at various ages. This may  include screening for:  Breast cancer.  Cervical cancer.  Colorectal cancer.  Skin cancer.  Lung cancer. What should I know about heart disease, diabetes, and high blood pressure? Blood pressure and heart disease  High blood pressure causes heart disease and increases the risk of stroke. This is more likely to develop in people who have high blood pressure readings, are of African descent, or are overweight.  Have your blood pressure checked: ? Every 3-5 years if you are 69-63 years of age. ? Every year if you are 74 years old or older. Diabetes Have regular diabetes screenings. This checks your fasting blood sugar level. Have the screening done:  Once every three years after age 49 if you are at a normal weight and have a low risk for diabetes.  More often and at a younger age if you are overweight or have a high risk for diabetes. What should I know about preventing infection? Hepatitis B If you have a higher risk for hepatitis B, you should be screened for this virus. Talk with your health care provider to find out if you are at risk for hepatitis B infection. Hepatitis C Testing is recommended for:  Everyone born from [birth year] through 1963.  Anyone with known risk factors for hepatitis C. Sexually transmitted infections (STIs)  Get screened for STIs, including gonorrhea and chlamydia, if: ? You are sexually active and are younger than 83 years of age. ? You are older than 83 years of age and your health  care provider tells you that you are at risk for this type of infection. ? Your sexual activity has changed since you were last screened, and you are at increased risk for chlamydia or gonorrhea. Ask your health care provider if you are at risk.  Ask your health care provider about whether you are at high risk for HIV. Your health care provider may recommend a prescription medicine to help prevent HIV infection. If you choose to take medicine to prevent HIV, you should first  get tested for HIV. You should then be tested every 3 months for as long as you are taking the medicine. Pregnancy  If you are about to stop having your period (premenopausal) and you may become pregnant, seek counseling before you get pregnant.  Take 400 to 800 micrograms (mcg) of folic acid every day if you become pregnant.  Ask for birth control (contraception) if you want to prevent pregnancy. Osteoporosis and menopause Osteoporosis is a disease in which the bones lose minerals and strength with aging. This can result in bone fractures. If you are 66 years old or older, or if you are at risk for osteoporosis and fractures, ask your health care provider if you should:  Be screened for bone loss.  Take a calcium or vitamin D supplement to lower your risk of fractures.  Be given hormone replacement therapy (HRT) to treat symptoms of menopause. Follow these instructions at home: Lifestyle  Do not use any products that contain nicotine or tobacco, such as cigarettes, e-cigarettes, and chewing tobacco. If you need help quitting, ask your health care provider.  Do not use street drugs.  Do not share needles.  Ask your health care provider for help if you need support or information about quitting drugs. Alcohol use  Do not drink alcohol if: ? Your health care provider tells you not to drink. ? You are pregnant, may be pregnant, or are planning to become pregnant.  If you drink alcohol: ? Limit how much you use to 0-1 drink a day. ? Limit intake if you are breastfeeding.  Be aware of how much alcohol is in your drink. In the [place] one drink equals one 12 oz bottle of beer (355 mL), one 5 oz glass of wine (148 mL), or one 1 oz glass of hard liquor (44 mL). General instructions  Schedule regular health, dental, and eye exams.  Stay current with your vaccines.  Tell your health care provider if: ? You often feel depressed. ? You have ever been abused or do not feel safe at  home. Summary  Adopting a healthy lifestyle and getting preventive care are important in promoting health and wellness.  Follow your health care provider's instructions about healthy diet, exercising, and getting tested or screened for diseases.  Follow your health care provider's instructions on monitoring your cholesterol and blood pressure. This information is not intended to replace advice given to you by your health care provider. Make sure you discuss any questions you have with your health care provider. Document Released: 11/25/2010 Document Revised: 05/05/2018 Document Reviewed: 05/05/2018 Elsevier Patient Education  2020 [employer].

## 2019-02-07 NOTE — Progress Notes (Signed)
Subjective:   Patient ID: Pamela Whitaker, female    DOB: 04-28-1933, 85 y.o.   MRN: [id number]  HPI Here for medicare wellness and physical, no new complaints. Please see A/P for status and treatment of chronic medical problems.   Diet: heart healthy Physical activity: sedentary, tries to be active Depression/mood screen: negative Hearing: intact to whispered voice Visual acuity: grossly normal with lens, performs annual eye exam  ADLs: capable Fall risk: none Home safety: good Cognitive evaluation: intact to orientation, naming, recall and repetition EOL planning: adv directives discussed    Office Visit from 02/07/2019 in [place]  PHQ-2 Total Score  1        Office Visit from 10/29/2016 in [place]  PHQ-9 Total Score  7      I have personally reviewed and have noted 1. The patient's medical and social history - reviewed today no changes 2. Their use of alcohol, tobacco or illicit drugs 3. Their current medications and supplements 4. The patient's functional ability including ADL's, fall risks, home safety risks and hearing or visual impairment. 5. Diet and physical activities 6. Evidence for depression or mood disorders 7. Care team reviewed and updated  Patient Care Team: Hoyt Koch, MD as PCP - General (Internal Medicine) Josue Hector, MD as PCP - Cardiology (Cardiology) Josue Hector, MD as Consulting Physician (Cardiology) Hayden Pedro, MD as Consulting Physician (Ophthalmology) Laurence Spates, MD as Consulting Physician (Gastroenterology) Newt Minion, MD as Consulting Physician (Orthopedic Surgery) Danella Sensing, MD as Consulting Physician (Dermatology) Oneida Arenas, DDS (Dentistry) Erline Hau, MD as Consulting Physician (Plastic Surgery) Past Medical History:  Diagnosis Date  . Atrial fibrillation ([place])    treated with multaq x 6 mos in 2011....[name]=2 (Age; + CHF; no  CVA; no DM2). Echo 05/24/10: EF 30% mild AS(mean 32mmgHg); mild LAE; mod LAE; PASP 35 Systolic CHF  . Breast cancer ([place])     in 1987 with a left  mastectomy  . Cardiomyopathy    a. tachy mediated Myoview 06/06/10:  EF 31%; no scar or ischemia; EF up to 50% per echo November 2013  . Diverticulosis   . DJD (degenerative joint disease) of knee    right knee  . Falls   . Hypercholesteremia   . Hypertension   . Lumbar stenosis   . Osteoporosis    history of bone chips in the right knee,previous right fibular fracture, operated on by Dr. Veverly Fells  . RLS (restless legs syndrome)   . Vitamin D deficiency    Past Surgical History:  Procedure Laterality Date  . BREAST RECONSTRUCTION  1988   left breast  . CARDIOVERSION  2012  . KNEE ARTHROSCOPY  2005  . MASTECTOMY, RADICAL  1987   left breast  . WRIST FRACTURE SURGERY     left wrist   Family History  Problem Relation Age of Onset  . Heart attack Father   . Aortic aneurysm Mother   . Breast cancer Sister   . Diabetes Sister   . Stomach cancer Sister     Review of Systems  Constitutional: Negative.   HENT: Negative.   Eyes: Negative.   Respiratory: Negative for cough, chest tightness and shortness of breath.   Cardiovascular: Negative for chest pain, palpitations and leg swelling.  Gastrointestinal: Negative for abdominal distention, abdominal pain, constipation, diarrhea, nausea and vomiting.  Musculoskeletal: Negative.   Skin: Negative.   Neurological: Negative.   Psychiatric/Behavioral: Negative.  Objective:  Physical Exam Constitutional:      Appearance: She is well-developed.  HENT:     Head: Normocephalic and atraumatic.  Neck:     Musculoskeletal: Normal range of motion.  Cardiovascular:     Rate and Rhythm: Normal rate and regular rhythm.  Pulmonary:     Effort: Pulmonary effort is normal. No respiratory distress.     Breath sounds: Normal breath sounds. No wheezing or rales.  Abdominal:     General: Bowel  sounds are normal. There is no distension.     Palpations: Abdomen is soft.     Tenderness: There is no abdominal tenderness. There is no rebound.  Skin:    General: Skin is warm and dry.  Neurological:     Mental Status: She is alert and oriented to person, place, and time.     Coordination: Coordination normal.     Vitals:   02/07/19 1444  BP: 130/90  Pulse: 88  Temp: 98.2 F (36.8 C)  TempSrc: Oral  SpO2: 99%  Weight: 160 lb (72.6 kg)  Height: 5\' 4"  (1.626 m)    Assessment & Plan:  Flu shot given at visit

## 2019-02-08 ENCOUNTER — Other Ambulatory Visit: Payer: Self-pay | Admitting: Cardiovascular Disease

## 2019-02-08 ENCOUNTER — Other Ambulatory Visit: Payer: Self-pay | Admitting: Internal Medicine

## 2019-02-09 NOTE — Assessment & Plan Note (Signed)
Stable on coreg. Checking CMP and adjust as needed.

## 2019-02-09 NOTE — Assessment & Plan Note (Signed)
Flu shot given. Pneumonia complete. Shingrix counseled. Tetanus due 2029. Colonoscopy aged out. Mammogram aged out, pap smear aged out and dexa due 2021. Counseled about sun safety and mole surveillance. Counseled about the dangers of distracted driving. Given 10 year screening recommendations.

## 2019-02-09 NOTE — Assessment & Plan Note (Signed)
Taking xarelto and coreg. Rate controlled. Checking CBC.

## 2019-03-16 ENCOUNTER — Other Ambulatory Visit: Payer: Self-pay | Admitting: Cardiovascular Disease

## 2019-04-05 NOTE — Progress Notes (Signed)
Cardiology Office Note    Date:  04/07/2019   ID:  BETTEY MURAOKA, DOB 1933-01-16, MRN [id number]  PCP:  Hoyt Koch, MD  Cardiologist:  Dr. Johnsie Cancel  CC: 6 month follow up  History of Present Illness:   85 y.o. chronic afib CHA2VASC 5 on xarelto. Failed Multaq and amiodarone rate control strategy adoopted  History of NIDCM improved last EF by myovue 03/09/16 62%. Likes to complain About medication and find reasons not to take them with myriad of somatic complaints. Some dementia contributes to problem  No palpitations, dyspnea or chest pain Seems to be compliant with meds   03/04/18 referred to Raliegh Ip by primary for knee pain Weakness in right foot contributing Seen by Tyson Dense ? PosteriorTibial tendon dysfunction   Told her she could take her higher dose of beta blocker at night as it helps her sleep  Has bad knees and needs more PT Uses wheel chair rather than walker mostly    Past Medical History:  Diagnosis Date  . Atrial fibrillation ([place])    treated with multaq x 6 mos in 2011....[name]=2 (Age; + CHF; no CVA; no DM2). Echo 05/24/10: EF 30% mild AS(mean 65mgHg); mild LAE; mod LAE; PASP 35 Systolic CHF  . [place]     in [place] with a left  mastectomy  . Cardiomyopathy    a. tachy mediated Myoview 06/06/10:  EF 31%; no scar or ischemia; EF up to 50% per echo November 2013  . Diverticulosis   . DJD (degenerative joint disease) of knee    right knee  . Falls   . Hypercholesteremia   . Hypertension   . Lumbar stenosis   . Osteoporosis    history of bone chips in the right knee,previous right fibular fracture, operated on by Dr. NVeverly Fells . RLS (restless legs syndrome)   . Vitamin D deficiency     Past Surgical History:  Procedure Laterality Date  . BREAST RECONSTRUCTION  1988   left breast  . CARDIOVERSION  2012  . KNEE ARTHROSCOPY  2005  . MASTECTOMY, RADICAL  1987   left breast  . WRIST FRACTURE SURGERY     left wrist    Current  Medications: Outpatient Medications Prior to Visit  Medication Sig Dispense Refill  . acetaminophen (TYLENOL) 500 MG tablet Take 500 mg by mouth every 6 (six) hours as needed (pt takes 1-2 tablets at night for pain).    . carvedilol (COREG) 6.25 MG tablet TAKE 2 TABLETS BY MOUTH EVERY MORNING AND 1 TABLET EVERY EVENING 90 tablet 0  . Cholecalciferol (VITAMIN D) 2000 UNITS tablet Take 2,000 Units by mouth daily. Does not take regularly    . famotidine (PEPCID) 40 MG tablet Take 1 tablet (40 mg total) by mouth at bedtime. 90 tablet 1  . Multiple Vitamin (MULTIVITAMIN) capsule Take 1 capsule by mouth daily. Women's Centrum    . MYRBETRIQ 25 MG TB24 tablet TAKE 1 TABLET BY MOUTH EVERY DAY 90 tablet 1  . XARELTO 20 MG TABS tablet TAKE 1 TABLET (20 MG TOTAL) BY MOUTH DAILY WITH SUPPER. 90 tablet 3   No facility-administered medications prior to visit.      Allergies:   Amiodarone, Lipitor [atorvastatin], and Multaq [dronedarone]   Social History   Socioeconomic History  . Marital status: Married    Spouse name: kChrissie Noa . Number of children: 2  . Years of education: college  . Highest education level: Not on file  Occupational History  . Occupation: retired    Fish farm manager: RETIRED  Social Needs  . Financial resource strain: Not on file  . Food insecurity    Worry: Not on file    Inability: Not on file  . Transportation needs    Medical: Not on file    Non-medical: Not on file  Tobacco Use  . Smoking status: Never Smoker  . Smokeless tobacco: Never Used  Substance and Sexual Activity  . Alcohol use: No  . Drug use: No  . Sexual activity: Never  Lifestyle  . Physical activity    Days per week: Not on file    Minutes per session: Not on file  . Stress: Not on file  Relationships  . Social Herbalist on phone: Not on file    Gets together: Not on file    Attends religious service: Not on file    Active member of club or organization: Not on file    Attends meetings  of clubs or organizations: Not on file    Relationship status: Not on file  Other Topics Concern  . Not on file  Social History Narrative  . Not on file     Family History:  The patient's family history includes Aortic aneurysm in her mother; Breast cancer in her sister; Diabetes in her sister; Heart attack in her father; Stomach cancer in her sister.     ROS:   Please see the history of present illness.    ROS All other systems reviewed and are negative.   PHYSICAL EXAM:   VS:  Ht _0  (1.626 m)   BMI 27.46 kg/m    Affect appropriate Healthy:  appears stated age 21: normal Neck supple with no adenopathy JVP normal no bruits no thyromegaly Lungs clear with no wheezing and good diaphragmatic motion Heart:  S1/S2 no murmur, no rub, gallop or click PMI normal Abdomen: benighn, BS positve, no tenderness, no AAA no bruit.  No HSM or HJR Distal pulses intact with no bruits No edema Neuro non-focal Skin warm and dry No muscular weakness   Wt Readings from Last 3 Encounters:  02/07/19 160 lb (72.6 kg)  10/25/18 160 lb (72.6 kg)  07/11/18 150 lb (68 kg)      Studies/Labs Reviewed:   EKG:  07/23/17  atrial fibrillation . HR 88 04/07/19 afib rate 69 otherwise normal   Recent Labs: 02/07/2019: ALT 12; BUN 10; Creatinine, Ser 0.62; Hemoglobin 12.3; Platelets 194.0; Potassium 3.8; Pro B Natriuretic peptide (BNP) 218.0; Sodium 141; TSH 3.18   Lipid Panel    Component Value Date/Time   CHOL 194 02/07/2019 1530   TRIG 206.0 (H) 02/07/2019 1530   HDL 41.50 02/07/2019 1530   CHOLHDL 5 02/07/2019 1530   VLDL 41.2 (H) 02/07/2019 1530   LDLCALC 171 (H) 03/20/2015 1351   LDLDIRECT 117.0 02/07/2019 1530    Additional studies/ records that were reviewed today include:  2D ECHO: 04/21/2012 LV EF: 50% -  55% Study Conclusion - Left ventricle: The cavity size was normal. Wall thickness was normal. Systolic function was normal. The estimated ejection fraction was in the  range of 50% to 55%. Wall motion was normal; there were no regional wall motion abnormalities. - Aortic valve: There was very mild stenosis. Mild regurgitation. - Mitral valve: Mild regurgitation. - Atrial septum: No defect or patent foramen ovale was identified. - Tricuspid valve: Moderate regurgitation.   ASSESSMENT & PLAN:   Chronic atrial fibrillation: continue Xarelto  94m daily. CHADSVASC score at least 5 (CHF, HTN, age, F sex).  Rate well controlled on Coreg 12.534mBID.  She attributes many different sx to Xarelto which I think is unrelated   Fatigue: labs and TSH ok f/u pirmary   DOE: functional EF normal by myovue 2017 see above will update echo   HTN: BP well controlled on current regimen   HLD: lipids followed by PCP. TC 265, TG 233 and LDL 177. PCP recommended that she start statin therapy. She refuses statin therapy.   Hx of NICM: EF improved back to low normal in 2013. Appears euvolemic. EF 62% myovue 03/10/16  Back Pain: referred to Dr NuSherwood Gamblery primary she stopped fosamax as she thinks it made pain worse   PeJenkins Rouge

## 2019-04-07 ENCOUNTER — Ambulatory Visit: Payer: Medicare Other | Admitting: Cardiovascular Disease

## 2019-04-07 ENCOUNTER — Encounter: Payer: Self-pay | Admitting: Cardiovascular Disease

## 2019-04-07 ENCOUNTER — Other Ambulatory Visit: Payer: Self-pay

## 2019-04-07 VITALS — BP 122/74 | HR 69 | Ht 64.0 in | Wt 160.0 lb

## 2019-04-07 DIAGNOSIS — I482 Chronic atrial fibrillation, unspecified: Secondary | ICD-10-CM | POA: Diagnosis not present

## 2019-04-07 NOTE — Patient Instructions (Signed)

## 2019-04-08 ENCOUNTER — Other Ambulatory Visit: Payer: Self-pay | Admitting: Cardiovascular Disease

## 2019-04-11 NOTE — Addendum Note (Signed)
Addended by: Jacinta Shoe on: 04/11/2019 11:46 AM   Modules accepted: Orders

## 2019-05-11 ENCOUNTER — Other Ambulatory Visit (HOSPITAL_COMMUNITY): Payer: Medicare Other

## 2019-05-18 ENCOUNTER — Ambulatory Visit (HOSPITAL_COMMUNITY): Payer: Medicare Other | Admitting: Hematology

## 2019-05-18 ENCOUNTER — Ambulatory Visit (HOSPITAL_COMMUNITY): Payer: Medicare Other

## 2019-05-28 ENCOUNTER — Other Ambulatory Visit: Payer: Self-pay | Admitting: Cardiovascular Disease

## 2019-05-30 ENCOUNTER — Other Ambulatory Visit (HOSPITAL_COMMUNITY): Payer: Self-pay | Admitting: *Deleted

## 2019-05-30 DIAGNOSIS — D72829 Elevated white blood cell count, unspecified: Secondary | ICD-10-CM

## 2019-05-30 DIAGNOSIS — M810 Age-related osteoporosis without current pathological fracture: Secondary | ICD-10-CM

## 2019-05-30 NOTE — Telephone Encounter (Signed)
Xarelto 20mg  refill request received. Pt is 86years old, weight- 72.6kg, Crea-0.62 on 02/07/2019, last seen by Dr. Johnsie Cancel on 04/07/2019, Diagnosis-Afib, CrCl-77.33ml/min; Dose is appropriate based on dosing criteria. Will send in refill to requested pharmacy.

## 2019-05-31 ENCOUNTER — Inpatient Hospital Stay (HOSPITAL_COMMUNITY): Payer: Medicare PPO | Attending: Hematology

## 2019-05-31 ENCOUNTER — Other Ambulatory Visit: Payer: Self-pay

## 2019-05-31 DIAGNOSIS — I429 Cardiomyopathy, unspecified: Secondary | ICD-10-CM | POA: Diagnosis not present

## 2019-05-31 DIAGNOSIS — G2581 Restless legs syndrome: Secondary | ICD-10-CM | POA: Diagnosis not present

## 2019-05-31 DIAGNOSIS — Z9012 Acquired absence of left breast and nipple: Secondary | ICD-10-CM | POA: Insufficient documentation

## 2019-05-31 DIAGNOSIS — Z853 Personal history of malignant neoplasm of breast: Secondary | ICD-10-CM | POA: Insufficient documentation

## 2019-05-31 DIAGNOSIS — M810 Age-related osteoporosis without current pathological fracture: Secondary | ICD-10-CM

## 2019-05-31 DIAGNOSIS — Z79899 Other long term (current) drug therapy: Secondary | ICD-10-CM | POA: Diagnosis not present

## 2019-05-31 DIAGNOSIS — Z800 Family history of malignant neoplasm of digestive organs: Secondary | ICD-10-CM | POA: Insufficient documentation

## 2019-05-31 DIAGNOSIS — Z833 Family history of diabetes mellitus: Secondary | ICD-10-CM | POA: Diagnosis not present

## 2019-05-31 DIAGNOSIS — I4891 Unspecified atrial fibrillation: Secondary | ICD-10-CM | POA: Diagnosis not present

## 2019-05-31 DIAGNOSIS — Z8249 Family history of ischemic heart disease and other diseases of the circulatory system: Secondary | ICD-10-CM | POA: Diagnosis not present

## 2019-05-31 DIAGNOSIS — I110 Hypertensive heart disease with heart failure: Secondary | ICD-10-CM | POA: Diagnosis not present

## 2019-05-31 DIAGNOSIS — Z803 Family history of malignant neoplasm of breast: Secondary | ICD-10-CM | POA: Insufficient documentation

## 2019-05-31 DIAGNOSIS — D72829 Elevated white blood cell count, unspecified: Secondary | ICD-10-CM

## 2019-05-31 DIAGNOSIS — Z7901 Long term (current) use of anticoagulants: Secondary | ICD-10-CM | POA: Insufficient documentation

## 2019-05-31 DIAGNOSIS — E7800 Pure hypercholesterolemia, unspecified: Secondary | ICD-10-CM | POA: Diagnosis not present

## 2019-05-31 LAB — CBC WITH DIFFERENTIAL/PLATELET
Abs Immature Granulocytes: 0.02 10*3/uL (ref 0.00–0.07)
Basophils Absolute: 0.1 10*3/uL (ref 0.0–0.1)
Basophils Relative: 1 %
Eosinophils Absolute: 0.2 10*3/uL (ref 0.0–0.5)
Eosinophils Relative: 2 %
HCT: 40.1 % (ref 36.0–46.0)
Hemoglobin: 13 g/dL (ref 12.0–15.0)
Immature Granulocytes: 0 %
Lymphocytes Relative: 21 %
Lymphs Abs: 1.5 10*3/uL (ref 0.7–4.0)
MCH: 30.8 pg (ref 26.0–34.0)
MCHC: 32.4 g/dL (ref 30.0–36.0)
MCV: 95 fL (ref 80.0–100.0)
Monocytes Absolute: 0.7 10*3/uL (ref 0.1–1.0)
Monocytes Relative: 10 %
Neutro Abs: 4.9 10*3/uL (ref 1.7–7.7)
Neutrophils Relative %: 66 %
Platelets: 192 10*3/uL (ref 150–400)
RBC: 4.22 MIL/uL (ref 3.87–5.11)
RDW: 12.4 % (ref 11.5–15.5)
WBC: 7.3 10*3/uL (ref 4.0–10.5)
nRBC: 0 % (ref 0.0–0.2)

## 2019-05-31 LAB — COMPREHENSIVE METABOLIC PANEL
ALT: 19 U/L (ref 0–44)
AST: 17 U/L (ref 15–41)
Albumin: 3.4 g/dL — ABNORMAL LOW (ref 3.5–5.0)
Alkaline Phosphatase: 61 U/L (ref 38–126)
Anion gap: 7 (ref 5–15)
BUN: 14 mg/dL (ref 8–23)
CO2: 26 mmol/L (ref 22–32)
Calcium: 9.2 mg/dL (ref 8.9–10.3)
Chloride: 106 mmol/L (ref 98–111)
Creatinine, Ser: 0.78 mg/dL (ref 0.44–1.00)
GFR calc Af Amer: >60 mL/min (ref >60)
GFR calc non Af Amer: >60 mL/min (ref >60)
Glucose, Bld: 91 mg/dL (ref 70–99)
Potassium: 4.3 mmol/L (ref 3.5–5.1)
Sodium: 139 mmol/L (ref 135–145)
Total Bilirubin: 0.7 mg/dL (ref 0.3–1.2)
Total Protein: 6.7 g/dL (ref 6.5–8.1)

## 2019-06-07 ENCOUNTER — Encounter (HOSPITAL_COMMUNITY): Payer: Self-pay | Admitting: Hematology

## 2019-06-07 ENCOUNTER — Encounter (HOSPITAL_COMMUNITY): Payer: Self-pay

## 2019-06-07 ENCOUNTER — Other Ambulatory Visit: Payer: Self-pay

## 2019-06-07 ENCOUNTER — Inpatient Hospital Stay (HOSPITAL_COMMUNITY): Payer: Medicare PPO

## 2019-06-07 ENCOUNTER — Inpatient Hospital Stay (HOSPITAL_BASED_OUTPATIENT_CLINIC_OR_DEPARTMENT_OTHER): Payer: Medicare PPO | Admitting: Hematology

## 2019-06-07 DIAGNOSIS — Z1231 Encounter for screening mammogram for malignant neoplasm of breast: Secondary | ICD-10-CM | POA: Diagnosis not present

## 2019-06-07 DIAGNOSIS — I4891 Unspecified atrial fibrillation: Secondary | ICD-10-CM | POA: Diagnosis not present

## 2019-06-07 DIAGNOSIS — G2581 Restless legs syndrome: Secondary | ICD-10-CM | POA: Diagnosis not present

## 2019-06-07 DIAGNOSIS — E7800 Pure hypercholesterolemia, unspecified: Secondary | ICD-10-CM | POA: Diagnosis not present

## 2019-06-07 DIAGNOSIS — I110 Hypertensive heart disease with heart failure: Secondary | ICD-10-CM | POA: Diagnosis not present

## 2019-06-07 DIAGNOSIS — Z79899 Other long term (current) drug therapy: Secondary | ICD-10-CM | POA: Diagnosis not present

## 2019-06-07 DIAGNOSIS — I429 Cardiomyopathy, unspecified: Secondary | ICD-10-CM | POA: Diagnosis not present

## 2019-06-07 DIAGNOSIS — Z7901 Long term (current) use of anticoagulants: Secondary | ICD-10-CM | POA: Diagnosis not present

## 2019-06-07 DIAGNOSIS — Z853 Personal history of malignant neoplasm of breast: Secondary | ICD-10-CM | POA: Diagnosis not present

## 2019-06-07 DIAGNOSIS — Z9012 Acquired absence of left breast and nipple: Secondary | ICD-10-CM | POA: Diagnosis not present

## 2019-06-07 NOTE — Progress Notes (Signed)
Patient decided to not receive the Prolia shot.

## 2019-06-07 NOTE — Progress Notes (Signed)
Pamela Whitaker, [place] [address]   CLINIC:  Medical Oncology/Hematology  PCP:  Hoyt Koch, MD [place] [id number] [phone]   REASON FOR VISIT:  Follow-up for left breast cancer.   INTERVAL HISTORY:  Pamela Whitaker 86 y.o. female seen for follow-up of left breast cancer.  She was also seen for osteoporosis.  She is not taking calcium on a regular basis according to the daughter.  Appetite is 100%.  Energy levels are 75%.  Denies any fevers or night sweats.  She has not had any fractures in the last 6 months.  REVIEW OF SYSTEMS:  Review of Systems  Constitutional: Negative for fatigue.  HENT:  Negative.   Eyes: Negative.   Respiratory: Negative.   Cardiovascular: Negative.   Gastrointestinal: Negative.   Genitourinary: Negative.    Skin: Negative.   Hematological: Negative.   Psychiatric/Behavioral: Negative.   All other systems reviewed and are negative.    PAST MEDICAL/SURGICAL HISTORY:  Past Medical History:  Diagnosis Date  . Atrial fibrillation ([place])    treated with multaq x 6 mos in 2011....[name]=2 (Age; + CHF; no CVA; no DM2). Echo 05/24/10: EF 30% mild AS(mean 34mmgHg); mild LAE; mod LAE; PASP 35 Systolic CHF  . Breast cancer ([place])     in 1987 with a left  mastectomy  . Cardiomyopathy    a. tachy mediated Myoview 06/06/10:  EF 31%; no scar or ischemia; EF up to 50% per echo November 2013  . Diverticulosis   . DJD (degenerative joint disease) of knee    right knee  . Falls   . Hypercholesteremia   . Hypertension   . Lumbar stenosis   . Osteoporosis    history of bone chips in the right knee,previous right fibular fracture, operated on by Dr. Veverly Fells  . RLS (restless legs syndrome)   . Vitamin D deficiency    Past Surgical History:  Procedure Laterality Date  . BREAST RECONSTRUCTION  1988   left breast  . CARDIOVERSION  2012  . KNEE ARTHROSCOPY  2005  . MASTECTOMY, RADICAL  1987    left breast  . WRIST FRACTURE SURGERY     left wrist     SOCIAL HISTORY:  Social History   Socioeconomic History  . Marital status: Married    Spouse name: Chrissie Noa  . Number of children: 2  . Years of education: college  . Highest education level: Not on file  Occupational History  . Occupation: retired    Fish farm manager: RETIRED  Tobacco Use  . Smoking status: Never Smoker  . Smokeless tobacco: Never Used  Substance and Sexual Activity  . Alcohol use: No  . Drug use: No  . Sexual activity: Never  Other Topics Concern  . Not on file  Social History Narrative  . Not on file   Social Determinants of Health   Financial Resource Strain:   . Difficulty of Paying Living Expenses: Not on file  Food Insecurity:   . Worried About Charity fundraiser in the Last Year: Not on file  . Ran Out of Food in the Last Year: Not on file  Transportation Needs:   . Lack of Transportation (Medical): Not on file  . Lack of Transportation (Non-Medical): Not on file  Physical Activity:   . Days of Exercise per Week: Not on file  . Minutes of Exercise per Session: Not on file  Stress:   . Feeling of Stress :  Not on file  Social Connections:   . Frequency of Communication with Friends and Family: Not on file  . Frequency of Social Gatherings with Friends and Family: Not on file  . Attends Religious Services: Not on file  . Active Member of Clubs or Organizations: Not on file  . Attends Archivist Meetings: Not on file  . Marital Status: Not on file  Intimate Partner Violence:   . Fear of Current or Ex-Partner: Not on file  . Emotionally Abused: Not on file  . Physically Abused: Not on file  . Sexually Abused: Not on file    FAMILY HISTORY:  Family History  Problem Relation Age of Onset  . Heart attack Father   . Aortic aneurysm Mother   . Breast cancer Sister   . Diabetes Sister   . Stomach cancer Sister     CURRENT MEDICATIONS:  Outpatient Encounter Medications as of  06/07/2019  Medication Sig  . acetaminophen (TYLENOL) 500 MG tablet Take 500 mg by mouth every 6 (six) hours as needed (pt takes 1-2 tablets at night for pain).  . carvedilol (COREG) 6.25 MG tablet TAKE 2 TABLETS BY MOUTH EVERY MORNING AND 1 TABLET EVERY EVENING  . Cholecalciferol (VITAMIN D) 2000 UNITS tablet Take 2,000 Units by mouth daily. Does not take regularly  . famotidine (PEPCID) 40 MG tablet Take 1 tablet (40 mg total) by mouth at bedtime.  . Multiple Vitamin (MULTIVITAMIN) capsule Take 1 capsule by mouth daily. Women's Centrum  . MYRBETRIQ 25 MG TB24 tablet TAKE 1 TABLET BY MOUTH EVERY DAY  . XARELTO 20 MG TABS tablet TAKE 1 TABLET (20 MG TOTAL) BY MOUTH DAILY WITH SUPPER.   No facility-administered encounter medications on file as of 06/07/2019.    ALLERGIES:  Allergies  Allergen Reactions  . Amiodarone     Stopped on her own  . Lipitor [Atorvastatin]     weakness  . Multaq [Dronedarone]     Stopped on her own     PHYSICAL EXAM:  ECOG Performance status: 1 There were no vitals filed for this visit. There were no vitals filed for this visit.  Physical Exam  Constitutional: She is oriented to person, place, and time and well-developed, well-nourished, and in no distress.  HENT:  Head: Atraumatic.  Cardiovascular: Normal rate and regular rhythm.  Pulmonary/Chest: Effort normal and breath sounds normal. No respiratory distress.  Abdominal: Soft. She exhibits no distension. There is no abdominal tenderness.  Musculoskeletal:        General: No edema.  Neurological: She is alert and oriented to person, place, and time.  Skin: Skin is warm.   Left breast prosthesis in place.  No palpable masses.  No palpable adenopathy.  Right breast has no palpable masses.  LABORATORY DATA:  I have reviewed the labs as listed.  CBC    Component Value Date/Time   WBC 7.3 05/31/2019 1242   RBC 4.22 05/31/2019 1242   HGB 13.0 05/31/2019 1242   HGB 13.4 06/10/2018 1200   HCT 40.1  05/31/2019 1242   HCT 38.6 06/10/2018 1200   PLT 192 05/31/2019 1242   PLT CANCELED 06/10/2018 1200   MCV 95.0 05/31/2019 1242   MCV 90 06/10/2018 1200   MCH 30.8 05/31/2019 1242   MCHC 32.4 05/31/2019 1242   RDW 12.4 05/31/2019 1242   RDW 12.5 06/10/2018 1200   LYMPHSABS 1.5 05/31/2019 1242   LYMPHSABS 1.6 11/04/2016 1012   MONOABS 0.7 05/31/2019 1242   EOSABS 0.2  05/31/2019 1242   EOSABS 0.1 11/04/2016 1012   BASOSABS 0.1 05/31/2019 1242   BASOSABS 0.1 11/04/2016 1012   CMP Latest Ref Rng & Units 05/31/2019 02/07/2019 10/19/2018  Glucose 70 - 99 mg/dL 91 88 83  BUN 8 - 23 mg/dL 14 10 13   Creatinine 0.44 - 1.00 mg/dL 0.78 0.62 0.72  Sodium 135 - 145 mmol/L 139 141 140  Potassium 3.5 - 5.1 mmol/L 4.3 3.8 4.0  Chloride 98 - 111 mmol/L 106 106 106  CO2 22 - 32 mmol/L 26 28 27   Calcium 8.9 - 10.3 mg/dL 9.2 9.4 9.2  Total Protein 6.5 - 8.1 g/dL 6.7 6.7 7.0  Total Bilirubin 0.3 - 1.2 mg/dL 0.7 0.6 0.7  Alkaline Phos 38 - 126 U/L 61 59 64  AST 15 - 41 U/L 17 16 18   ALT 0 - 44 U/L 19 12 15        DIAGNOSTIC IMAGING:  I have independently reviewed her radiology exams.     ASSESSMENT & PLAN:   HX: breast cancer 1.  Left breast cancer: - She had stage III left breast cancer, status post left mastectomy with reconstruction done in 1987.  Tumor was 6.5 cm with 2/27 lymph nodes positive. -She underwent chemotherapy with CAF VP.  She apparently took antiestrogen therapy for 12 months.  She could not tolerate tamoxifen. -Right breast mammogram on 11/15/2018 was BI-RADS Category 1. -Physical exam today shows left breast prosthesis within normal limits.  No palpable mass in the right breast.  No palpable adenopathy. -We have reviewed her blood work which was grossly within normal limits.  We will continue yearly visits.  2.  Osteoporosis: -DEXA scan on 08/27/2017 shows T score of -2.7. -I have recommended Prolia.  She does not want to get it because of side effects. -I have recommended  her to take calcium and vitamin D twice daily. -We will plan to check vitamin D level prior to next visit.     Orders placed this encounter:  Orders Placed This Encounter  Procedures  . MM 3D SCREEN BREAST UNI RIGHT  . CBC with Differential/Platelet  . Comprehensive metabolic panel  . Vitamin D 25 hydroxy      Derek Jack, MD [place] [phone]

## 2019-06-07 NOTE — Patient Instructions (Addendum)
[place] at [facility] Discharge Instructions  You were seen today by Dr. Delton Coombes. He went over your recent lab results. He discussed you starting the Prolia injection again and what the side effects could be. Please think about this injection and let us know if you would like to start the injection. You should be taking the Calcium with vit D daily. He will see you back in 6 months for labs and follow up.   Thank you for choosing [place] at [facility] to provide your oncology and hematology care.  To afford each patient quality time with our provider, please arrive at least 15 minutes before your scheduled appointment time.   If you have a lab appointment with [facility] please come in thru the  Main Entrance and check in at the main information desk  You need to re-schedule your appointment should you arrive 10 or more minutes late.  We strive to give you quality time with our providers, and arriving late affects you and other patients whose appointments are after yours.  Also, if you no show three or more times for appointments you may be dismissed from the clinic at the providers discretion.     Again, thank you for choosing [facility].  Our hope is that these requests will decrease the amount of time that you wait before being seen by our physicians.       _____________________________________________________________  Should you have questions after your visit to [facility], please contact our office at [phone] between the hours of 8:00 a.m. and 4:30 p.m.  Voicemails left after 4:00 p.m. will not be returned until the following business day.  For prescription refill requests, have your pharmacy contact our office and allow 72 hours.    Cancer Center Support Programs:   > Cancer Support Group  2nd Tuesday of the month 1pm-2pm, Journey Room

## 2019-06-07 NOTE — Assessment & Plan Note (Signed)
1.  Left breast cancer: - She had stage III left breast cancer, status post left mastectomy with reconstruction done in 1987.  Tumor was 6.5 cm with 2/27 lymph nodes positive. -She underwent chemotherapy with CAF VP.  She apparently took antiestrogen therapy for 12 months.  She could not tolerate tamoxifen. -Right breast mammogram on 11/15/2018 was BI-RADS Category 1. -Physical exam today shows left breast prosthesis within normal limits.  No palpable mass in the right breast.  No palpable adenopathy. -We have reviewed her blood work which was grossly within normal limits.  We will continue yearly visits.  2.  Osteoporosis: -DEXA scan on 08/27/2017 shows T score of -2.7. -I have recommended Prolia.  She does not want to get it because of side effects. -I have recommended her to take calcium and vitamin D twice daily. -We will plan to check vitamin D level prior to next visit.

## 2019-06-16 ENCOUNTER — Ambulatory Visit: Payer: Medicare PPO | Attending: Internal Medicine

## 2019-06-16 DIAGNOSIS — Z23 Encounter for immunization: Secondary | ICD-10-CM | POA: Insufficient documentation

## 2019-06-16 NOTE — Progress Notes (Signed)
   Covid-19 Vaccination Clinic  Name:  Pamela Whitaker    MRN: [id number] DOB: 18-May-1933  06/16/2019  [name] was observed post Covid-19 immunization for 15 minutes without incidence. She was provided with Vaccine Information Sheet and instruction to access the V-Safe system.   [name] was instructed to call 911 with any severe reactions post vaccine: Marland Kitchen Difficulty breathing  . Swelling of your face and throat  . A fast heartbeat  . A bad rash all over your body  . Dizziness and weakness    Immunizations Administered    Name Date Dose VIS Date Route   Pfizer COVID-19 Vaccine 06/16/2019 12:11 PM 0.3 mL 05/06/2019 Intramuscular   Manufacturer: [place]   Lot: [id number]   [place]: [id number]

## 2019-07-07 ENCOUNTER — Ambulatory Visit: Payer: Medicare PPO | Attending: Internal Medicine

## 2019-07-07 DIAGNOSIS — Z23 Encounter for immunization: Secondary | ICD-10-CM | POA: Insufficient documentation

## 2019-07-07 NOTE — Progress Notes (Signed)
   Covid-19 Vaccination Clinic  Name:  Pamela Whitaker    MRN: [id number] DOB: Jan 04, 1933  07/07/2019  [name] was observed post Covid-19 immunization for 15 minutes without incidence. She was provided with Vaccine Information Sheet and instruction to access the V-Safe system.   [name] was instructed to call 911 with any severe reactions post vaccine: Marland Kitchen Difficulty breathing  . Swelling of your face and throat  . A fast heartbeat  . A bad rash all over your body  . Dizziness and weakness    Immunizations Administered    Name Date Dose VIS Date Route   Pfizer COVID-19 Vaccine 07/07/2019  1:19 PM 0.3 mL 05/06/2019 Intramuscular   Manufacturer: [place]   Lot: [id number]   [place]: [id number]

## 2019-09-22 DIAGNOSIS — M1712 Unilateral primary osteoarthritis, left knee: Secondary | ICD-10-CM | POA: Diagnosis not present

## 2019-09-22 DIAGNOSIS — M170 Bilateral primary osteoarthritis of knee: Secondary | ICD-10-CM | POA: Diagnosis not present

## 2019-09-22 DIAGNOSIS — M1711 Unilateral primary osteoarthritis, right knee: Secondary | ICD-10-CM | POA: Diagnosis not present

## 2019-11-18 ENCOUNTER — Ambulatory Visit (HOSPITAL_COMMUNITY): Payer: Medicare PPO

## 2019-11-29 ENCOUNTER — Other Ambulatory Visit: Payer: Self-pay

## 2019-11-29 ENCOUNTER — Inpatient Hospital Stay (HOSPITAL_COMMUNITY): Payer: Medicare PPO | Attending: Hematology

## 2019-11-29 DIAGNOSIS — Z9012 Acquired absence of left breast and nipple: Secondary | ICD-10-CM | POA: Insufficient documentation

## 2019-11-29 DIAGNOSIS — Z853 Personal history of malignant neoplasm of breast: Secondary | ICD-10-CM | POA: Diagnosis not present

## 2019-11-29 DIAGNOSIS — Z79899 Other long term (current) drug therapy: Secondary | ICD-10-CM | POA: Insufficient documentation

## 2019-11-29 DIAGNOSIS — M810 Age-related osteoporosis without current pathological fracture: Secondary | ICD-10-CM | POA: Diagnosis not present

## 2019-11-29 DIAGNOSIS — Z9221 Personal history of antineoplastic chemotherapy: Secondary | ICD-10-CM | POA: Insufficient documentation

## 2019-11-29 LAB — COMPREHENSIVE METABOLIC PANEL
ALT: 19 U/L (ref 0–44)
AST: 19 U/L (ref 15–41)
Albumin: 3.8 g/dL (ref 3.5–5.0)
Alkaline Phosphatase: 60 U/L (ref 38–126)
Anion gap: 7 (ref 5–15)
BUN: 11 mg/dL (ref 8–23)
CO2: 28 mmol/L (ref 22–32)
Calcium: 9.7 mg/dL (ref 8.9–10.3)
Chloride: 104 mmol/L (ref 98–111)
Creatinine, Ser: 0.79 mg/dL (ref 0.44–1.00)
GFR calc Af Amer: >60 mL/min (ref >60)
GFR calc non Af Amer: >60 mL/min (ref >60)
Glucose, Bld: 104 mg/dL — ABNORMAL HIGH (ref 70–99)
Potassium: 4.4 mmol/L (ref 3.5–5.1)
Sodium: 139 mmol/L (ref 135–145)
Total Bilirubin: 0.6 mg/dL (ref 0.3–1.2)
Total Protein: 7 g/dL (ref 6.5–8.1)

## 2019-11-29 LAB — CBC WITH DIFFERENTIAL/PLATELET
Abs Immature Granulocytes: 0.03 10*3/uL (ref 0.00–0.07)
Basophils Absolute: 0.1 10*3/uL (ref 0.0–0.1)
Basophils Relative: 1 %
Eosinophils Absolute: 0.2 10*3/uL (ref 0.0–0.5)
Eosinophils Relative: 2 %
HCT: 40.4 % (ref 36.0–46.0)
Hemoglobin: 13.3 g/dL (ref 12.0–15.0)
Immature Granulocytes: 0 %
Lymphocytes Relative: 23 %
Lymphs Abs: 1.7 10*3/uL (ref 0.7–4.0)
MCH: 31.5 pg (ref 26.0–34.0)
MCHC: 32.9 g/dL (ref 30.0–36.0)
MCV: 95.7 fL (ref 80.0–100.0)
Monocytes Absolute: 0.9 10*3/uL (ref 0.1–1.0)
Monocytes Relative: 12 %
Neutro Abs: 4.5 10*3/uL (ref 1.7–7.7)
Neutrophils Relative %: 62 %
Platelets: 226 10*3/uL (ref 150–400)
RBC: 4.22 MIL/uL (ref 3.87–5.11)
RDW: 12.9 % (ref 11.5–15.5)
WBC: 7.2 10*3/uL (ref 4.0–10.5)
nRBC: 0 % (ref 0.0–0.2)

## 2019-11-29 LAB — VITAMIN D 25 HYDROXY (VIT D DEFICIENCY, FRACTURES): Vit D, 25-Hydroxy: 25.56 ng/mL — ABNORMAL LOW (ref 30–100)

## 2019-12-02 ENCOUNTER — Other Ambulatory Visit: Payer: Self-pay

## 2019-12-02 ENCOUNTER — Ambulatory Visit (HOSPITAL_COMMUNITY)
Admission: RE | Admit: 2019-12-02 | Discharge: 2019-12-02 | Disposition: A | Discharge status: Home / Self Care | Payer: Medicare PPO | Source: Ambulatory Visit | Attending: Hematology | Admitting: Hematology

## 2019-12-02 DIAGNOSIS — Z1231 Encounter for screening mammogram for malignant neoplasm of breast: Secondary | ICD-10-CM | POA: Insufficient documentation

## 2019-12-06 ENCOUNTER — Ambulatory Visit (HOSPITAL_COMMUNITY): Payer: Medicare PPO | Admitting: Hematology

## 2019-12-06 ENCOUNTER — Ambulatory Visit (HOSPITAL_COMMUNITY): Payer: Medicare PPO

## 2020-02-27 NOTE — Progress Notes (Signed)
Cardiology Office Note    Date:  03/01/2020   ID:  Pamela Whitaker, DOB [birth year], MRN [id number]  PCP:  Hoyt Koch, MD  Cardiologist:  Dr. Johnsie Cancel  CC: 6 month follow up  History of Present Illness:   86 y.o. chronic afib CHA2VASC 5 on xarelto. Failed Multaq and amiodarone rate control strategy adoopted  History of NIDCM improved last EF by myovue 03/09/16 62%. Likes to complain About medication and find reasons not to take them with myriad of somatic complaints. Some dementia contributes to problem  No palpitations, dyspnea or chest pain Seems to be compliant with meds   Has bad knees and needs more PT Uses wheel chair rather than walker mostly   Son with her today He lives in [place]. She needs blood work being on blood thinner   Past Medical History:  Diagnosis Date  . Atrial fibrillation ([place])    treated with multaq x 6 mos in 2011....[name]=2 (Age; + CHF; no CVA; no DM2). Echo 05/24/10: EF 30% mild AS(mean 64mgHg); mild LAE; mod LAE; PASP 35 Systolic CHF  . Breast cancer ([place]     in 1987 with a left  mastectomy  . Cardiomyopathy    a. tachy mediated Myoview 06/06/10:  EF 31%; no scar or ischemia; EF up to 50% per echo November 2013  . Diverticulosis   . DJD (degenerative joint disease) of knee    right knee  . Falls   . Hypercholesteremia   . Hypertension   . Lumbar stenosis   . Osteoporosis    history of bone chips in the right knee,previous right fibular fracture, operated on by Dr. NVeverly Fells . RLS (restless legs syndrome)   . Vitamin D deficiency     Past Surgical History:  Procedure Laterality Date  . BREAST RECONSTRUCTION  1988   left breast  . CARDIOVERSION  2012  . KNEE ARTHROSCOPY  2005  . MASTECTOMY, RADICAL  1987   left breast  . WRIST FRACTURE SURGERY     left wrist    Current Medications: Outpatient Medications Prior to Visit  Medication Sig Dispense Refill  . acetaminophen (TYLENOL) 500 MG tablet Take 500 mg by mouth every 6  (six) hours as needed (pt takes 1-2 tablets at night for pain).    . carvedilol (COREG) 6.25 MG tablet TAKE 2 TABLETS BY MOUTH EVERY MORNING AND 1 TABLET EVERY EVENING 270 tablet 3  . Multiple Vitamin (MULTIVITAMIN) capsule Take 1 capsule by mouth daily. Women's Centrum    . XARELTO 20 MG TABS tablet TAKE 1 TABLET (20 MG TOTAL) BY MOUTH DAILY WITH SUPPER. 90 tablet 2  . Cholecalciferol (VITAMIN D) 2000 UNITS tablet Take 2,000 Units by mouth daily. Does not take regularly (Patient not taking: Reported on 03/01/2020)    . famotidine (PEPCID) 40 MG tablet Take 1 tablet (40 mg total) by mouth at bedtime. (Patient not taking: Reported on 03/01/2020) 90 tablet 1  . MYRBETRIQ 25 MG TB24 tablet TAKE 1 TABLET BY MOUTH EVERY DAY (Patient not taking: Reported on 03/01/2020) 90 tablet 1   No facility-administered medications prior to visit.     Allergies:   Amiodarone, Lipitor [atorvastatin], and Multaq [dronedarone]   Social History   Socioeconomic History  . Marital status: Married    Spouse name: kChrissie Noa . Number of children: 2  . Years of education: college  . Highest education level: Not on file  Occupational History  . Occupation: retired    EFish farm manager  RETIRED  Tobacco Use  . Smoking status: Never Smoker  . Smokeless tobacco: Never Used  Vaping Use  . Vaping Use: Never used  Substance and Sexual Activity  . Alcohol use: No  . Drug use: No  . Sexual activity: Never  Other Topics Concern  . Not on file  Social History Narrative  . Not on file   Social Determinants of Health   Financial Resource Strain:   . Difficulty of Paying Living Expenses: Not on file  Food Insecurity:   . Worried About Charity fundraiser in the Last Year: Not on file  . Ran Out of Food in the Last Year: Not on file  Transportation Needs:   . Lack of Transportation (Medical): Not on file  . Lack of Transportation (Non-Medical): Not on file  Physical Activity:   . Days of Exercise per Week: Not on file  .  Minutes of Exercise per Session: Not on file  Stress:   . Feeling of Stress : Not on file  Social Connections:   . Frequency of Communication with Friends and Family: Not on file  . Frequency of Social Gatherings with Friends and Family: Not on file  . Attends Religious Services: Not on file  . Active Member of Clubs or Organizations: Not on file  . Attends Archivist Meetings: Not on file  . Marital Status: Not on file     Family History:  The patient's family history includes Aortic aneurysm in her mother; Breast cancer in her sister; Diabetes in her sister; Heart attack in her father; Stomach cancer in her sister.     ROS:   Please see the history of present illness.    ROS All other systems reviewed and are negative.   PHYSICAL EXAM:   VS:  BP (!) 140/92   Pulse 77   Ht 5' 4.75" (1.645 m)   Wt 158 lb 6.4 oz (71.8 kg)   SpO2 98%   BMI 26.56 kg/m    Affect appropriate Healthy:  appears stated age 19: normal Neck supple with no adenopathy JVP normal no bruits no thyromegaly Lungs clear with no wheezing and good diaphragmatic motion Heart:  S1/S2 no murmur, no rub, gallop or click PMI normal post left breast mastectomy with implant  Abdomen: benighn, BS positve, no tenderness, no AAA no bruit.  No HSM or HJR Distal pulses intact with no bruits No edema Neuro non-focal Skin warm and dry No muscular weakness   Wt Readings from Last 3 Encounters:  03/01/20 158 lb 6.4 oz (71.8 kg)  06/07/19 163 lb (73.9 kg)  04/07/19 160 lb (72.6 kg)      Studies/Labs Reviewed:   EKG:  03/01/20 afib rate 77 otherwise normal   Recent Labs: 11/29/2019: ALT 19; BUN 11; Creatinine, Ser 0.79; Hemoglobin 13.3; Platelets 226; Potassium 4.4; Sodium 139   Lipid Panel    Component Value Date/Time   CHOL 194 02/07/2019 1530   TRIG 206.0 (H) 02/07/2019 1530   HDL 41.50 02/07/2019 1530   CHOLHDL 5 02/07/2019 1530   VLDL 41.2 (H) 02/07/2019 1530   LDLCALC 171 (H) 03/20/2015  1351   LDLDIRECT 117.0 02/07/2019 1530    Additional studies/ records that were reviewed today include:  2D ECHO: 04/21/2012 LV EF: 50% -  55% Study Conclusion - Left ventricle: The cavity size was normal. Wall thickness was normal. Systolic function was normal. The estimated ejection fraction was in the range of 50% to 55%. Wall motion was  normal; there were no regional wall motion abnormalities. - Aortic valve: There was very mild stenosis. Mild regurgitation. - Mitral valve: Mild regurgitation. - Atrial septum: No defect or patent foramen ovale was identified. - Tricuspid valve: Moderate regurgitation.   ASSESSMENT & PLAN:   Chronic atrial fibrillation: continue Xarelto 37m daily. CHADSVASC score at least 5 (CHF, HTN, age, F sex).  Rate well controlled on Coreg 12.588mBID.  She attributes many different sx to Xarelto which I think is unrelated   Fatigue: labs and TSH ok f/u pirmary   DOE: functional EF normal by myovue 2017 see above will update echo   HTN: BP well controlled on current regimen   HLD: lipids followed by PCP. TC 265, TG 233 and LDL 177. PCP recommended that she start statin therapy. She refuses statin therapy.   Hx of NICM: EF improved back to low normal in 2013. Appears euvolemic. EF 62% myovue 03/10/16 will update echo  Back Pain: referred to Dr NuSherwood Gamblery primary she stopped fosamax as she thinks it made pain worse   F/U in a year Echo for history of DCM BMET/CBC/PLT being on blood thinner   PeJenkins Rouge

## 2020-03-01 ENCOUNTER — Other Ambulatory Visit: Payer: Self-pay

## 2020-03-01 ENCOUNTER — Encounter: Payer: Self-pay | Admitting: Cardiovascular Disease

## 2020-03-01 ENCOUNTER — Ambulatory Visit: Payer: Medicare PPO | Admitting: Cardiovascular Disease

## 2020-03-01 VITALS — BP 140/92 | HR 77 | Ht 64.75 in | Wt 158.4 lb

## 2020-03-01 DIAGNOSIS — E785 Hyperlipidemia, unspecified: Secondary | ICD-10-CM

## 2020-03-01 DIAGNOSIS — I482 Chronic atrial fibrillation, unspecified: Secondary | ICD-10-CM | POA: Diagnosis not present

## 2020-03-01 DIAGNOSIS — I420 Dilated cardiomyopathy: Secondary | ICD-10-CM | POA: Diagnosis not present

## 2020-03-01 DIAGNOSIS — I10 Essential (primary) hypertension: Secondary | ICD-10-CM

## 2020-03-01 NOTE — Patient Instructions (Addendum)
Medication Instructions:  *If you need a refill on your cardiac medications before your next appointment, please call your pharmacy*  Lab Work: Your physician recommends that you have lab work today- BMET and CBC  If you have labs (blood work) drawn today and your tests are completely normal, you will receive your results only by: Marland Kitchen MyChart Message (if you have MyChart) OR . A paper copy in the mail If you have any lab test that is abnormal or we need to change your treatment, we will call you to review the results.  Follow-Up: At [facility], you and your health needs are our priority.  As part of our continuing mission to provide you with exceptional heart care, we have created designated Provider Care Teams.  These Care Teams include your primary Cardiologist (physician) and Advanced Practice Providers (APPs -  Physician Assistants and Nurse Practitioners) who all work together to provide you with the care you need, when you need it.  We recommend signing up for the patient portal called "MyChart".  Sign up information is provided on this After Visit Summary.  MyChart is used to connect with patients for Virtual Visits (Telemedicine).  Patients are able to view lab/test results, encounter notes, upcoming appointments, etc.  Non-urgent messages can be sent to your provider as well.   To learn more about what you can do with MyChart, go to [email or web address].    Your next appointment:   6 month(s)  The format for your next appointment:   In Person  Provider:   You may see Jenkins Rouge, MD or one of the following Advanced Practice Providers on your designated Care Team:    Truitt Merle, NP  Cecilie Kicks, NP  Kathyrn Drown, NP

## 2020-03-02 LAB — CBC WITH DIFFERENTIAL/PLATELET
Basophils Absolute: 0.1 10*3/uL (ref 0.0–0.2)
Basos: 1 %
EOS (ABSOLUTE): 0.2 10*3/uL (ref 0.0–0.4)
Eos: 2 %
Hematocrit: 36.7 % (ref 34.0–46.6)
Hemoglobin: 12.5 g/dL (ref 11.1–15.9)
Immature Grans (Abs): 0 10*3/uL (ref 0.0–0.1)
Immature Granulocytes: 0 %
Lymphocytes Absolute: 1.8 10*3/uL (ref 0.7–3.1)
Lymphs: 22 %
MCH: 31.3 pg (ref 26.6–33.0)
MCHC: 34.1 g/dL (ref 31.5–35.7)
MCV: 92 fL (ref 79–97)
Monocytes Absolute: 0.8 10*3/uL (ref 0.1–0.9)
Monocytes: 10 %
Neutrophils Absolute: 5.1 10*3/uL (ref 1.4–7.0)
Neutrophils: 65 %
Platelets: 108 10*3/uL — ABNORMAL LOW (ref 150–450)
RBC: 4 x10E6/uL (ref 3.77–5.28)
RDW: 12 % (ref 11.7–15.4)
WBC: 7.9 10*3/uL (ref 3.4–10.8)

## 2020-03-02 LAB — BASIC METABOLIC PANEL
BUN/Creatinine Ratio: 12 (ref 12–28)
BUN: 10 mg/dL (ref 8–27)
CO2: 25 mmol/L (ref 20–29)
Calcium: 9.2 mg/dL (ref 8.7–10.3)
Chloride: 105 mmol/L (ref 96–106)
Creatinine, Ser: 0.83 mg/dL (ref 0.57–1.00)
GFR calc Af Amer: 74 mL/min/{1.73_m2} (ref >59)
GFR calc non Af Amer: 65 mL/min/{1.73_m2} (ref >59)
Glucose: 82 mg/dL (ref 65–99)
Potassium: 4.4 mmol/L (ref 3.5–5.2)
Sodium: 142 mmol/L (ref 134–144)

## 2020-03-07 ENCOUNTER — Other Ambulatory Visit: Payer: Self-pay | Admitting: Cardiovascular Disease

## 2020-03-07 DIAGNOSIS — I482 Chronic atrial fibrillation, unspecified: Secondary | ICD-10-CM

## 2020-03-07 NOTE — Telephone Encounter (Signed)
Prescription refill request for Xarelto received.  Indication: A Fib Last office visit: 03/01/20 Weight: 158 Age: 86 Scr:0.83  CrCl: 72mL/min

## 2020-03-13 IMAGING — DX DG FOOT COMPLETE 3+V*L*
3 series · 3 of 3 positions shown · non-contrast
Comparison: None.

CLINICAL DATA: Unwitnessed fall.  Pain.

EXAM:
LEFT FOOT - COMPLETE 3+ VIEW

[foot ap]
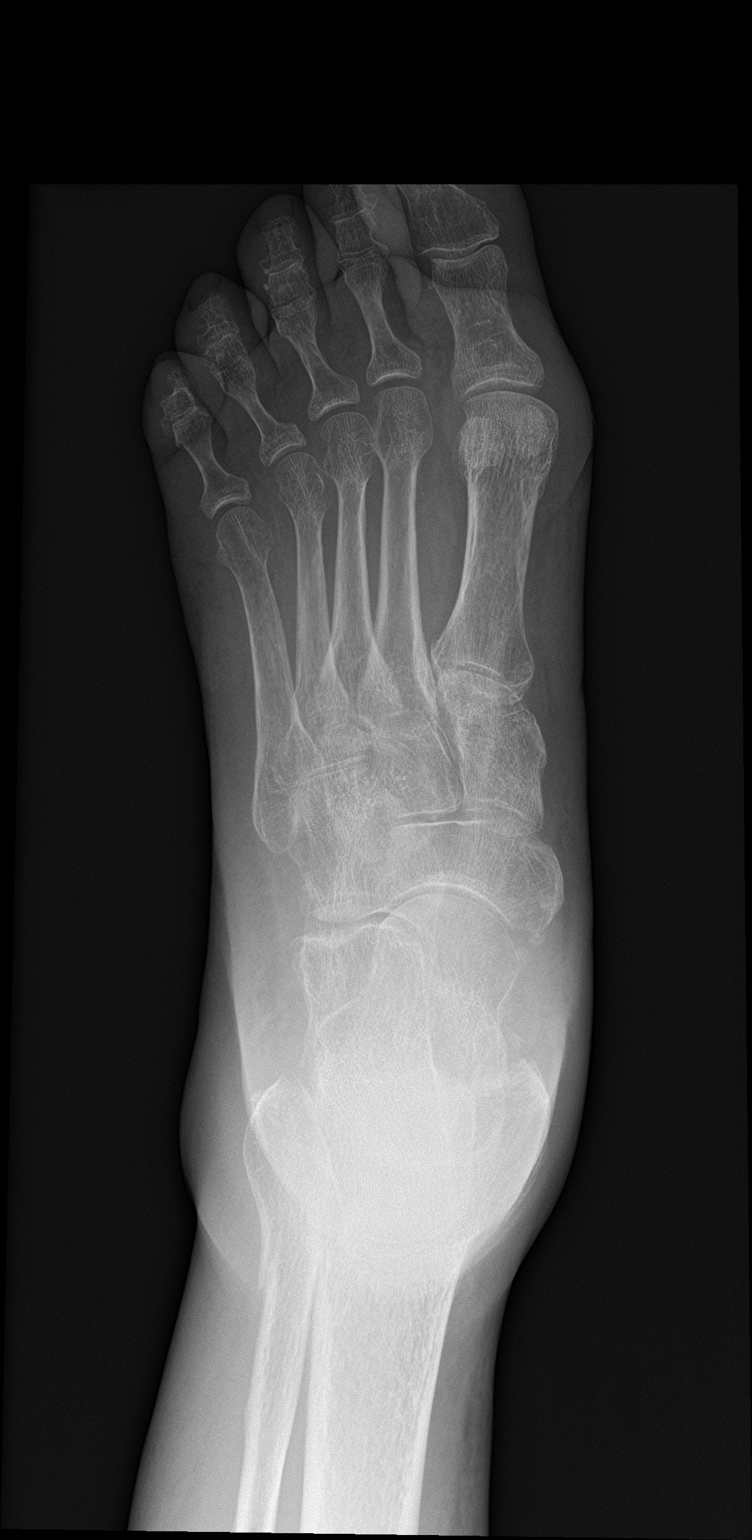

[foot obl]
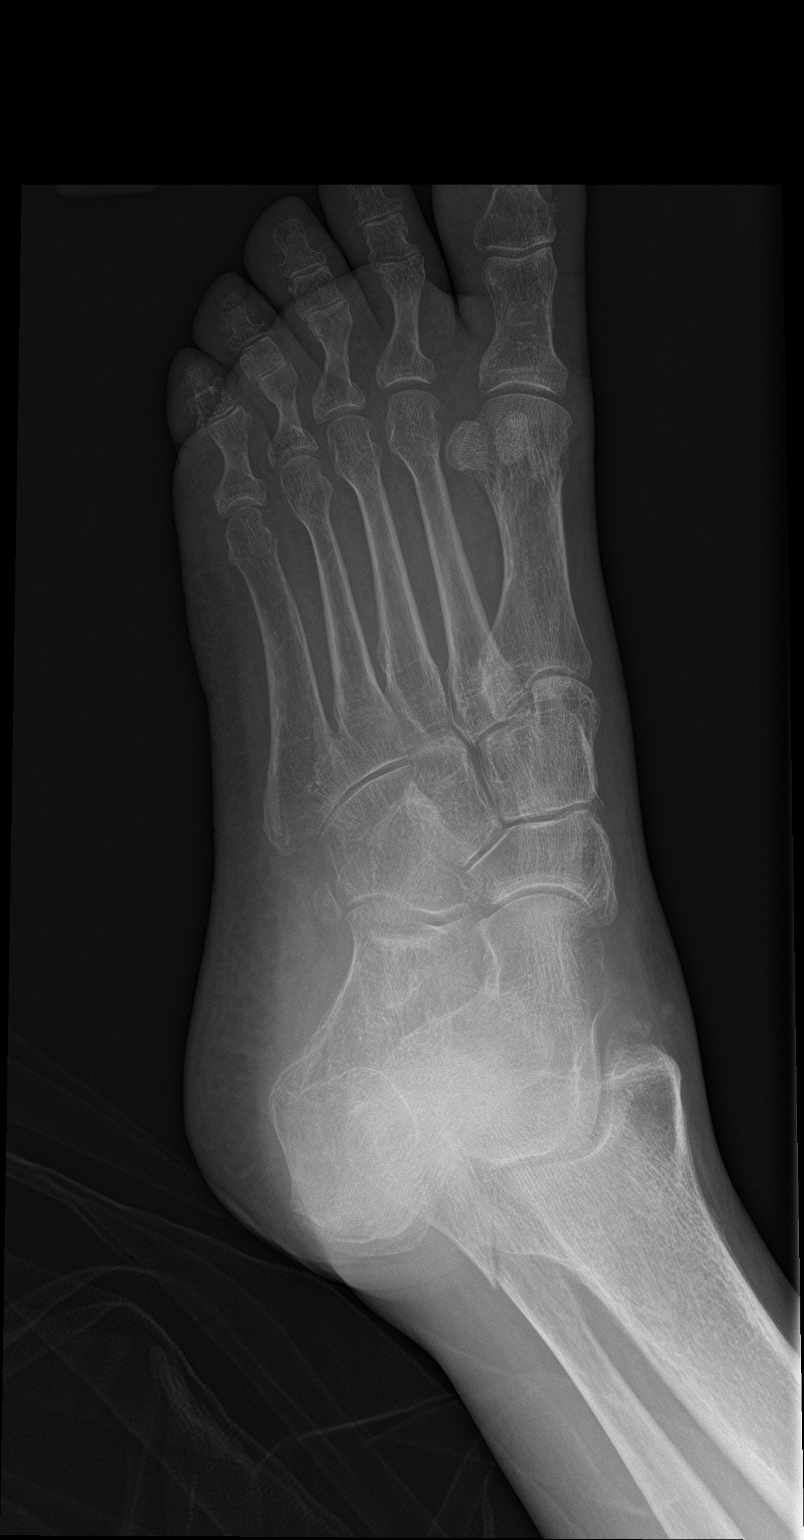

[foot lat]
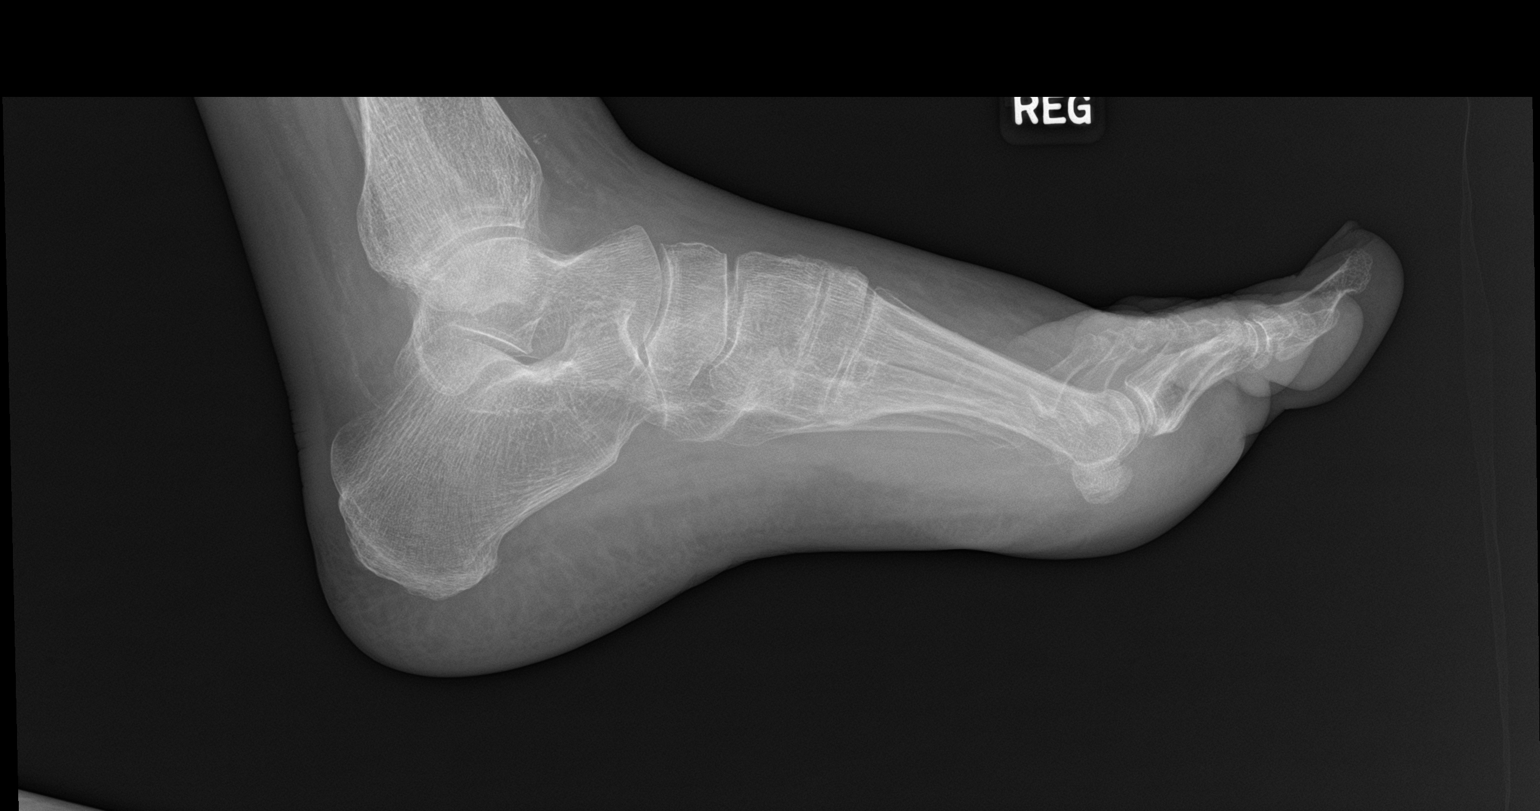

[3 of 3 positions shown; findings below may reference images not displayed]

FINDINGS: There is no evidence of fracture or dislocation. There is no
evidence of arthropathy or other focal bone abnormality. Soft
tissues are unremarkable.
IMPRESSION: Negative.

## 2020-03-24 ENCOUNTER — Ambulatory Visit: Payer: Medicare PPO

## 2020-04-04 ENCOUNTER — Other Ambulatory Visit: Payer: Self-pay | Admitting: Cardiovascular Disease

## 2020-05-11 ENCOUNTER — Encounter: Payer: Medicare PPO | Admitting: Internal Medicine

## 2020-05-30 ENCOUNTER — Other Ambulatory Visit (HOSPITAL_COMMUNITY): Payer: Self-pay | Admitting: *Deleted

## 2020-05-30 DIAGNOSIS — D72829 Elevated white blood cell count, unspecified: Secondary | ICD-10-CM

## 2020-05-30 DIAGNOSIS — M810 Age-related osteoporosis without current pathological fracture: Secondary | ICD-10-CM

## 2020-05-30 DIAGNOSIS — Z1231 Encounter for screening mammogram for malignant neoplasm of breast: Secondary | ICD-10-CM

## 2020-05-31 ENCOUNTER — Inpatient Hospital Stay (HOSPITAL_COMMUNITY): Payer: Medicare PPO | Attending: Hematology

## 2020-05-31 ENCOUNTER — Other Ambulatory Visit: Payer: Self-pay

## 2020-05-31 DIAGNOSIS — Z800 Family history of malignant neoplasm of digestive organs: Secondary | ICD-10-CM | POA: Insufficient documentation

## 2020-05-31 DIAGNOSIS — Z803 Family history of malignant neoplasm of breast: Secondary | ICD-10-CM | POA: Insufficient documentation

## 2020-05-31 DIAGNOSIS — E7800 Pure hypercholesterolemia, unspecified: Secondary | ICD-10-CM | POA: Diagnosis not present

## 2020-05-31 DIAGNOSIS — M810 Age-related osteoporosis without current pathological fracture: Secondary | ICD-10-CM | POA: Insufficient documentation

## 2020-05-31 DIAGNOSIS — Z8249 Family history of ischemic heart disease and other diseases of the circulatory system: Secondary | ICD-10-CM | POA: Diagnosis not present

## 2020-05-31 DIAGNOSIS — D72829 Elevated white blood cell count, unspecified: Secondary | ICD-10-CM

## 2020-05-31 DIAGNOSIS — I110 Hypertensive heart disease with heart failure: Secondary | ICD-10-CM | POA: Diagnosis not present

## 2020-05-31 DIAGNOSIS — Z853 Personal history of malignant neoplasm of breast: Secondary | ICD-10-CM | POA: Diagnosis not present

## 2020-05-31 DIAGNOSIS — Z7901 Long term (current) use of anticoagulants: Secondary | ICD-10-CM | POA: Diagnosis not present

## 2020-05-31 DIAGNOSIS — I429 Cardiomyopathy, unspecified: Secondary | ICD-10-CM | POA: Diagnosis not present

## 2020-05-31 DIAGNOSIS — Z9012 Acquired absence of left breast and nipple: Secondary | ICD-10-CM | POA: Diagnosis not present

## 2020-05-31 DIAGNOSIS — Z833 Family history of diabetes mellitus: Secondary | ICD-10-CM | POA: Insufficient documentation

## 2020-05-31 DIAGNOSIS — Z79899 Other long term (current) drug therapy: Secondary | ICD-10-CM | POA: Diagnosis not present

## 2020-05-31 DIAGNOSIS — I4891 Unspecified atrial fibrillation: Secondary | ICD-10-CM | POA: Diagnosis not present

## 2020-05-31 DIAGNOSIS — G2581 Restless legs syndrome: Secondary | ICD-10-CM | POA: Diagnosis not present

## 2020-05-31 DIAGNOSIS — Z1231 Encounter for screening mammogram for malignant neoplasm of breast: Secondary | ICD-10-CM

## 2020-05-31 LAB — CBC WITH DIFFERENTIAL/PLATELET
Abs Immature Granulocytes: 0.03 10*3/uL (ref 0.00–0.07)
Basophils Absolute: 0.1 10*3/uL (ref 0.0–0.1)
Basophils Relative: 1 %
Eosinophils Absolute: 0.1 10*3/uL (ref 0.0–0.5)
Eosinophils Relative: 1 %
HCT: 37.6 % (ref 36.0–46.0)
Hemoglobin: 11.8 g/dL — ABNORMAL LOW (ref 12.0–15.0)
Immature Granulocytes: 0 %
Lymphocytes Relative: 22 %
Lymphs Abs: 1.7 10*3/uL (ref 0.7–4.0)
MCH: 28.3 pg (ref 26.0–34.0)
MCHC: 31.4 g/dL (ref 30.0–36.0)
MCV: 90.2 fL (ref 80.0–100.0)
Monocytes Absolute: 0.8 10*3/uL (ref 0.1–1.0)
Monocytes Relative: 10 %
Neutro Abs: 5.1 10*3/uL (ref 1.7–7.7)
Neutrophils Relative %: 66 %
Platelets: 236 10*3/uL (ref 150–400)
RBC: 4.17 MIL/uL (ref 3.87–5.11)
RDW: 13.1 % (ref 11.5–15.5)
WBC: 7.7 10*3/uL (ref 4.0–10.5)
nRBC: 0 % (ref 0.0–0.2)

## 2020-05-31 LAB — COMPREHENSIVE METABOLIC PANEL
ALT: 13 U/L (ref 0–44)
AST: 16 U/L (ref 15–41)
Albumin: 3.4 g/dL — ABNORMAL LOW (ref 3.5–5.0)
Alkaline Phosphatase: 63 U/L (ref 38–126)
Anion gap: 7 (ref 5–15)
BUN: 9 mg/dL (ref 8–23)
CO2: 24 mmol/L (ref 22–32)
Calcium: 9.2 mg/dL (ref 8.9–10.3)
Chloride: 106 mmol/L (ref 98–111)
Creatinine, Ser: 0.65 mg/dL (ref 0.44–1.00)
GFR, Estimated: >60 mL/min (ref >60)
Glucose, Bld: 94 mg/dL (ref 70–99)
Potassium: 3.9 mmol/L (ref 3.5–5.1)
Sodium: 137 mmol/L (ref 135–145)
Total Bilirubin: 0.6 mg/dL (ref 0.3–1.2)
Total Protein: 6.8 g/dL (ref 6.5–8.1)

## 2020-05-31 LAB — VITAMIN D 25 HYDROXY (VIT D DEFICIENCY, FRACTURES): Vit D, 25-Hydroxy: 32.37 ng/mL (ref 30–100)

## 2020-06-07 ENCOUNTER — Ambulatory Visit (HOSPITAL_COMMUNITY): Payer: Medicare PPO | Admitting: Nurse Practitioner

## 2020-06-08 DIAGNOSIS — I10 Essential (primary) hypertension: Secondary | ICD-10-CM | POA: Diagnosis not present

## 2020-06-08 DIAGNOSIS — Z9181 History of falling: Secondary | ICD-10-CM | POA: Diagnosis not present

## 2020-06-08 DIAGNOSIS — Z7901 Long term (current) use of anticoagulants: Secondary | ICD-10-CM | POA: Diagnosis not present

## 2020-06-08 DIAGNOSIS — Z859 Personal history of malignant neoplasm, unspecified: Secondary | ICD-10-CM | POA: Diagnosis not present

## 2020-06-08 DIAGNOSIS — M810 Age-related osteoporosis without current pathological fracture: Secondary | ICD-10-CM | POA: Diagnosis not present

## 2020-06-08 DIAGNOSIS — E559 Vitamin D deficiency, unspecified: Secondary | ICD-10-CM | POA: Diagnosis not present

## 2020-06-08 DIAGNOSIS — M170 Bilateral primary osteoarthritis of knee: Secondary | ICD-10-CM | POA: Diagnosis not present

## 2020-06-12 ENCOUNTER — Other Ambulatory Visit: Payer: Self-pay

## 2020-06-12 ENCOUNTER — Encounter: Payer: Medicare PPO | Admitting: Internal Medicine

## 2020-06-12 ENCOUNTER — Inpatient Hospital Stay (HOSPITAL_BASED_OUTPATIENT_CLINIC_OR_DEPARTMENT_OTHER): Payer: Medicare PPO | Admitting: Oncology

## 2020-06-12 DIAGNOSIS — M810 Age-related osteoporosis without current pathological fracture: Secondary | ICD-10-CM | POA: Diagnosis not present

## 2020-06-12 DIAGNOSIS — Z1231 Encounter for screening mammogram for malignant neoplasm of breast: Secondary | ICD-10-CM | POA: Diagnosis not present

## 2020-06-12 NOTE — Progress Notes (Signed)
[place], [place] [address]   CLINIC:  Medical Oncology/Hematology  PCP:  Pamela Koch, MD [place] [id number] [phone]   REASON FOR VISIT:  Follow-up for left breast cancer.  I connected with Pamela Whitaker on 06/13/20 at 10:30 AM EST by telephone visit and verified that I am speaking with the correct person using two identifiers.   I discussed the limitations, risks, security and privacy concerns of performing an evaluation and management service by telemedicine and the availability of in-person appointments. I also discussed with the patient that there may be a patient responsible charge related to this service. The patient expressed understanding and agreed to proceed.   Other persons participating in the visit and their role in the encounter: None  Patient's location: Home Provider's location: Home  INTERVAL HISTORY:  Pamela Whitaker 87 y.o. female seen for follow-up of left breast cancer and osteoporosis.  She was last seen in our clinic on 06/07/2019.  In the interim, she has done well.  She was seen by cardiology for chronic A. fib.  She was instructed to continue Xarelto.  She also suffers from chronic back and joint pain.  She stopped Fosamax secondary to worsening pain.  Today, she is doing well.  She denies any recent infections, fevers or night sweats.  Her appetite and energy levels are good.   REVIEW OF SYSTEMS:  Review of Systems  Constitutional: Negative for fatigue.  HENT:  Negative.   Eyes: Negative.   Respiratory: Negative.   Cardiovascular: Negative.   Gastrointestinal: Negative.   Genitourinary: Negative.    Musculoskeletal: Positive for arthralgias and back pain.  Skin: Negative.   Hematological: Negative.   Psychiatric/Behavioral: Negative.   All other systems reviewed and are negative.    PAST MEDICAL/SURGICAL HISTORY:  Past Medical History:  Diagnosis Date  . Atrial  fibrillation ([place])    treated with multaq x 6 mos in 2011....[name]=2 (Age; + CHF; no CVA; no DM2). Echo 05/24/10: EF 30% mild AS(mean 72mmgHg); mild LAE; mod LAE; PASP 35 Systolic CHF  . Breast cancer ([place])     in 1987 with a left  mastectomy  . Cardiomyopathy    a. tachy mediated Myoview 06/06/10:  EF 31%; no scar or ischemia; EF up to 50% per echo November 2013  . Diverticulosis   . DJD (degenerative joint disease) of knee    right knee  . Falls   . Hypercholesteremia   . Hypertension   . Lumbar stenosis   . Osteoporosis    history of bone chips in the right knee,previous right fibular fracture, operated on by Dr. Veverly Whitaker  . RLS (restless legs syndrome)   . Vitamin D deficiency    Past Surgical History:  Procedure Laterality Date  . BREAST RECONSTRUCTION  1988   left breast  . CARDIOVERSION  2012  . KNEE ARTHROSCOPY  2005  . MASTECTOMY, RADICAL  1987   left breast  . WRIST FRACTURE SURGERY     left wrist     SOCIAL HISTORY:  Social History   Socioeconomic History  . Marital status: Married    Spouse name: Pamela Whitaker  . Number of children: 2  . Years of education: college  . Highest education level: Not on file  Occupational History  . Occupation: retired    Fish farm manager: RETIRED  Tobacco Use  . Smoking status: Never Smoker  . Smokeless tobacco: Never Used  Vaping Use  .  Vaping Use: Never used  Substance and Sexual Activity  . Alcohol use: No  . Drug use: No  . Sexual activity: Never  Other Topics Concern  . Not on file  Social History Narrative  . Not on file   Social Determinants of Health   Financial Resource Strain: Not on file  Food Insecurity: Not on file  Transportation Needs: Not on file  Physical Activity: Not on file  Stress: Not on file  Social Connections: Not on file  Intimate Partner Violence: Not on file    FAMILY HISTORY:  Family History  Problem Relation Age of Onset  . Heart attack Father   . Aortic aneurysm Mother   . Breast cancer  Sister   . Diabetes Sister   . Stomach cancer Sister     CURRENT MEDICATIONS:  Outpatient Encounter Medications as of 06/12/2020  Medication Sig  . acetaminophen (TYLENOL) 500 MG tablet Take 500 mg by mouth every 6 (six) hours as needed (pt takes 1-2 tablets at night for pain).  . carvedilol (COREG) 6.25 MG tablet TAKE 2 TABLETS BY MOUTH EVERY MORNING AND 1 TABLET EVERY EVENING  . Multiple Vitamin (MULTIVITAMIN) capsule Take 1 capsule by mouth daily. Women's Centrum  . XARELTO 20 MG TABS tablet TAKE 1 TABLET (20 MG TOTAL) BY MOUTH DAILY WITH SUPPER.   No facility-administered encounter medications on file as of 06/12/2020.    ALLERGIES:  Allergies  Allergen Reactions  . Amiodarone     Stopped on her own  . Lipitor [Atorvastatin]     weakness  . Multaq [Dronedarone]     Stopped on her own     PHYSICAL EXAM:  ECOG Performance status: 1 There were no vitals filed for this visit. There were no vitals filed for this visit.  -Limited secondary to virtual visit. -Patient speaking in full complete sentences.   LABORATORY DATA:  I have reviewed the labs as listed.  CBC    Component Value Date/Time   WBC 7.7 05/31/2020 1324   RBC 4.17 05/31/2020 1324   HGB 11.8 (L) 05/31/2020 1324   HGB 12.5 03/01/2020 1438   HCT 37.6 05/31/2020 1324   HCT 36.7 03/01/2020 1438   PLT 236 05/31/2020 1324   PLT 108 (L) 03/01/2020 1438   MCV 90.2 05/31/2020 1324   MCV 92 03/01/2020 1438   MCH 28.3 05/31/2020 1324   MCHC 31.4 05/31/2020 1324   RDW 13.1 05/31/2020 1324   RDW 12.0 03/01/2020 1438   LYMPHSABS 1.7 05/31/2020 1324   LYMPHSABS 1.8 03/01/2020 1438   MONOABS 0.8 05/31/2020 1324   EOSABS 0.1 05/31/2020 1324   EOSABS 0.2 03/01/2020 1438   BASOSABS 0.1 05/31/2020 1324   BASOSABS 0.1 03/01/2020 1438   CMP Latest Ref Rng & Units 05/31/2020 03/01/2020 11/29/2019  Glucose 70 - 99 mg/dL 94 82 104(H)  BUN 8 - 23 mg/dL 9 10 11   Creatinine 0.44 - 1.00 mg/dL 0.65 0.83 0.79  Sodium 135 - 145  mmol/L 137 142 139  Potassium 3.5 - 5.1 mmol/L 3.9 4.4 4.4  Chloride 98 - 111 mmol/L 106 105 104  CO2 22 - 32 mmol/L 24 25 28   Calcium 8.9 - 10.3 mg/dL 9.2 9.2 9.7  Total Protein 6.5 - 8.1 g/dL 6.8 - 7.0  Total Bilirubin 0.3 - 1.2 mg/dL 0.6 - 0.6  Alkaline Phos 38 - 126 U/L 63 - 60  AST 15 - 41 U/L 16 - 19  ALT 0 - 44 U/L 13 - 19  DIAGNOSTIC IMAGING:  I have independently reviewed her radiology exams.     ASSESSMENT & PLAN:  1.  Left breast cancer: - She had stage III left breast cancer, status post left mastectomy with reconstruction done in 1987.  Tumor was 6.5 cm with 2/27 lymph nodes positive. -She underwent chemotherapy with CAF VP.  She apparently took antiestrogen therapy for 12 months.  She could not tolerate tamoxifen. -Right breast mammogram on 12/02/2019 was BI-RADS Category 1 negative.  Screening mammogram recommended in 1 year. -We reviewed her lab work from 05/31/2020 which shows a hemoglobin of 11.8 and MCV of 90.2. -We will continue yearly visits  2.  Osteoporosis: -DEXA scan on 08/27/2017 shows T score of -2.7. -I have recommended Prolia.  She does not want to get it because of side effects. -I have recommended her to take calcium and vitamin D twice daily. -Vitamin D level from 05/31/2020 is 32.37. -Needs repeat DEXA scan.  Disposition: -RTC in 1 year for follow-up with labs (CBC, CMP and vitamin D). - Repeat DEXA in the next couple months. - Repeat mammogram in July 2022.  No problem-specific Assessment & Plan notes found for this encounter.   Orders placed this encounter:  Orders Placed This Encounter  Procedures  . DG Bone Density  . MM 3D SCREEN BREAST UNI RIGHT   I provided 15 minutes of non face-to-face telephone visit time during this encounter, and > 50% was spent counseling as documented under my assessment & plan.  Faythe Casa, NP 06/13/2020 8:47 AM  [place] [phone]

## 2020-06-15 DIAGNOSIS — M170 Bilateral primary osteoarthritis of knee: Secondary | ICD-10-CM | POA: Diagnosis not present

## 2020-06-15 DIAGNOSIS — Z859 Personal history of malignant neoplasm, unspecified: Secondary | ICD-10-CM | POA: Diagnosis not present

## 2020-06-15 DIAGNOSIS — M810 Age-related osteoporosis without current pathological fracture: Secondary | ICD-10-CM | POA: Diagnosis not present

## 2020-06-15 DIAGNOSIS — Z7901 Long term (current) use of anticoagulants: Secondary | ICD-10-CM | POA: Diagnosis not present

## 2020-06-15 DIAGNOSIS — I10 Essential (primary) hypertension: Secondary | ICD-10-CM | POA: Diagnosis not present

## 2020-06-15 DIAGNOSIS — E559 Vitamin D deficiency, unspecified: Secondary | ICD-10-CM | POA: Diagnosis not present

## 2020-06-15 DIAGNOSIS — Z9181 History of falling: Secondary | ICD-10-CM | POA: Diagnosis not present

## 2020-06-18 DIAGNOSIS — M170 Bilateral primary osteoarthritis of knee: Secondary | ICD-10-CM | POA: Diagnosis not present

## 2020-06-18 DIAGNOSIS — Z859 Personal history of malignant neoplasm, unspecified: Secondary | ICD-10-CM | POA: Diagnosis not present

## 2020-06-18 DIAGNOSIS — Z7901 Long term (current) use of anticoagulants: Secondary | ICD-10-CM | POA: Diagnosis not present

## 2020-06-18 DIAGNOSIS — M810 Age-related osteoporosis without current pathological fracture: Secondary | ICD-10-CM | POA: Diagnosis not present

## 2020-06-18 DIAGNOSIS — Z9181 History of falling: Secondary | ICD-10-CM | POA: Diagnosis not present

## 2020-06-18 DIAGNOSIS — I10 Essential (primary) hypertension: Secondary | ICD-10-CM | POA: Diagnosis not present

## 2020-06-18 DIAGNOSIS — E559 Vitamin D deficiency, unspecified: Secondary | ICD-10-CM | POA: Diagnosis not present

## 2020-06-18 DIAGNOSIS — S8265XA Nondisplaced fracture of lateral malleolus of left fibula, initial encounter for closed fracture: Secondary | ICD-10-CM | POA: Diagnosis not present

## 2020-06-21 DIAGNOSIS — E559 Vitamin D deficiency, unspecified: Secondary | ICD-10-CM | POA: Diagnosis not present

## 2020-06-21 DIAGNOSIS — Z9181 History of falling: Secondary | ICD-10-CM | POA: Diagnosis not present

## 2020-06-21 DIAGNOSIS — Z859 Personal history of malignant neoplasm, unspecified: Secondary | ICD-10-CM | POA: Diagnosis not present

## 2020-06-21 DIAGNOSIS — Z7901 Long term (current) use of anticoagulants: Secondary | ICD-10-CM | POA: Diagnosis not present

## 2020-06-21 DIAGNOSIS — I10 Essential (primary) hypertension: Secondary | ICD-10-CM | POA: Diagnosis not present

## 2020-06-21 DIAGNOSIS — M810 Age-related osteoporosis without current pathological fracture: Secondary | ICD-10-CM | POA: Diagnosis not present

## 2020-06-21 DIAGNOSIS — M170 Bilateral primary osteoarthritis of knee: Secondary | ICD-10-CM | POA: Diagnosis not present

## 2020-06-22 ENCOUNTER — Ambulatory Visit (INDEPENDENT_AMBULATORY_CARE_PROVIDER_SITE_OTHER): Payer: Medicare PPO | Admitting: Internal Medicine

## 2020-06-22 ENCOUNTER — Other Ambulatory Visit: Payer: Self-pay

## 2020-06-22 ENCOUNTER — Encounter: Payer: Self-pay | Admitting: Internal Medicine

## 2020-06-22 VITALS — BP 130/80 | HR 80 | Temp 98.2°F | Resp 18 | Ht 64.75 in | Wt 152.8 lb

## 2020-06-22 DIAGNOSIS — Z0000 Encounter for general adult medical examination without abnormal findings: Secondary | ICD-10-CM | POA: Diagnosis not present

## 2020-06-22 DIAGNOSIS — I4811 Longstanding persistent atrial fibrillation: Secondary | ICD-10-CM

## 2020-06-22 DIAGNOSIS — E785 Hyperlipidemia, unspecified: Secondary | ICD-10-CM

## 2020-06-22 DIAGNOSIS — I10 Essential (primary) hypertension: Secondary | ICD-10-CM | POA: Diagnosis not present

## 2020-06-22 LAB — URINALYSIS, ROUTINE W REFLEX MICROSCOPIC
Nitrite: POSITIVE — AB
Specific Gravity, Urine: >=1.03 — AB (ref 1.000–1.030)
Total Protein, Urine: 100 — AB
Urine Glucose: NEGATIVE
Urobilinogen, UA: 1 (ref 0.0–1.0)
pH: 5 (ref 5.0–8.0)

## 2020-06-22 LAB — LIPID PANEL
Cholesterol: 203 mg/dL — ABNORMAL HIGH (ref 0–200)
HDL: 49.9 mg/dL (ref >39.00)
NonHDL: 152.61
Total CHOL/HDL Ratio: 4
Triglycerides: 202 mg/dL — ABNORMAL HIGH (ref 0.0–149.0)
VLDL: 40.4 mg/dL — ABNORMAL HIGH (ref 0.0–40.0)

## 2020-06-22 LAB — LDL CHOLESTEROL, DIRECT: Direct LDL: 115 mg/dL

## 2020-06-22 NOTE — Patient Instructions (Addendum)
You may call the At-Home Vaccination Hotline at [phone], or fill out a registration form . For more information on at-home vaccination, visit [email or web address] .   Health Maintenance, Female Adopting a healthy lifestyle and getting preventive care are important in promoting health and wellness. Ask your health care provider about:  The right schedule for you to have regular tests and exams.  Things you can do on your own to prevent diseases and keep yourself healthy. What should I know about diet, weight, and exercise? Eat a healthy diet  Eat a diet that includes plenty of vegetables, fruits, low-fat dairy products, and lean protein.  Do not eat a lot of foods that are high in solid fats, added sugars, or sodium.   Maintain a healthy weight Body mass index (BMI) is used to identify weight problems. It estimates body fat based on height and weight. Your health care provider can help determine your BMI and help you achieve or maintain a healthy weight. Get regular exercise Get regular exercise. This is one of the most important things you can do for your health. Most adults should:  Exercise for at least 150 minutes each week. The exercise should increase your heart rate and make you sweat (moderate-intensity exercise).  Do strengthening exercises at least twice a week. This is in addition to the moderate-intensity exercise.  Spend less time sitting. Even light physical activity can be beneficial. Watch cholesterol and blood lipids Have your blood tested for lipids and cholesterol at 85 years of age, then have this test every 5 years. Have your cholesterol levels checked more often if:  Your lipid or cholesterol levels are high.  You are older than 85 years of age.  You are at high risk for heart disease. What should I know about cancer screening? Depending on your health history and family history, you may need to have cancer screening at various ages. This may include  screening for:  Breast cancer.  Cervical cancer.  Colorectal cancer.  Skin cancer.  Lung cancer. What should I know about heart disease, diabetes, and high blood pressure? Blood pressure and heart disease  High blood pressure causes heart disease and increases the risk of stroke. This is more likely to develop in people who have high blood pressure readings, are of African descent, or are overweight.  Have your blood pressure checked: ? Every 3-5 years if you are 22-45 years of age. ? Every year if you are 80 years old or older. Diabetes Have regular diabetes screenings. This checks your fasting blood sugar level. Have the screening done:  Once every three years after age 68 if you are at a normal weight and have a low risk for diabetes.  More often and at a younger age if you are overweight or have a high risk for diabetes. What should I know about preventing infection? Hepatitis B If you have a higher risk for hepatitis B, you should be screened for this virus. Talk with your health care provider to find out if you are at risk for hepatitis B infection. Hepatitis C Testing is recommended for:  Everyone born from [birth year] through 1965.  Anyone with known risk factors for hepatitis C. Sexually transmitted infections (STIs)  Get screened for STIs, including gonorrhea and chlamydia, if: ? You are sexually active and are younger than 85 years of age. ? You are older than 85 years of age and your health care provider tells you that you are at risk for this type of  infection. ? Your sexual activity has changed since you were last screened, and you are at increased risk for chlamydia or gonorrhea. Ask your health care provider if you are at risk.  Ask your health care provider about whether you are at high risk for HIV. Your health care provider may recommend a prescription medicine to help prevent HIV infection. If you choose to take medicine to prevent HIV, you should first get  tested for HIV. You should then be tested every 3 months for as long as you are taking the medicine. Pregnancy  If you are about to stop having your period (premenopausal) and you may become pregnant, seek counseling before you get pregnant.  Take 400 to 800 micrograms (mcg) of folic acid every day if you become pregnant.  Ask for birth control (contraception) if you want to prevent pregnancy. Osteoporosis and menopause Osteoporosis is a disease in which the bones lose minerals and strength with aging. This can result in bone fractures. If you are 51 years old or older, or if you are at risk for osteoporosis and fractures, ask your health care provider if you should:  Be screened for bone loss.  Take a calcium or vitamin D supplement to lower your risk of fractures.  Be given hormone replacement therapy (HRT) to treat symptoms of menopause. Follow these instructions at home: Lifestyle  Do not use any products that contain nicotine or tobacco, such as cigarettes, e-cigarettes, and chewing tobacco. If you need help quitting, ask your health care provider.  Do not use street drugs.  Do not share needles.  Ask your health care provider for help if you need support or information about quitting drugs. Alcohol use  Do not drink alcohol if: ? Your health care provider tells you not to drink. ? You are pregnant, may be pregnant, or are planning to become pregnant.  If you drink alcohol: ? Limit how much you use to 0-1 drink a day. ? Limit intake if you are breastfeeding.  Be aware of how much alcohol is in your drink. In the [place] one drink equals one 12 oz bottle of beer (355 mL), one 5 oz glass of wine (148 mL), or one 1 oz glass of hard liquor (44 mL). General instructions  Schedule regular health, dental, and eye exams.  Stay current with your vaccines.  Tell your health care provider if: ? You often feel depressed. ? You have ever been abused or do not feel safe at  home. Summary  Adopting a healthy lifestyle and getting preventive care are important in promoting health and wellness.  Follow your health care provider's instructions about healthy diet, exercising, and getting tested or screened for diseases.  Follow your health care provider's instructions on monitoring your cholesterol and blood pressure. This information is not intended to replace advice given to you by your health care provider. Make sure you discuss any questions you have with your health care provider. Document Revised: 05/05/2018 Document Reviewed: 05/05/2018 Elsevier Patient Education  2021 [employer].

## 2020-06-22 NOTE — Assessment & Plan Note (Signed)
On coreg and xarelto. Checking CMP and CBC. Adjust as needed. Rate controlled.

## 2020-06-22 NOTE — Assessment & Plan Note (Signed)
BP at goal on coreg.  

## 2020-06-22 NOTE — Assessment & Plan Note (Signed)
Checking lipid panel and not currently on statin.

## 2020-06-22 NOTE — Assessment & Plan Note (Signed)
Flu shot up to date. Covid-19 2 shots encouraged booster. Pneumonia complete. Shingrix counseled. Tetanus due 2029. Colonoscopy aged out. Mammogram aged out, pap smear aged out and dexa declines further. Counseled about sun safety and mole surveillance. Counseled about the dangers of distracted driving. Given 10 year screening recommendations.

## 2020-06-22 NOTE — Progress Notes (Signed)
Subjective:   Patient ID: Pamela Whitaker, female    DOB: 02-Sep-1932, 87 y.o.   MRN: [id number]  HPI Here for medicare wellness and physical, no new complaints. Please see A/P for status and treatment of chronic medical problems.   Diet: heart healthy  Physical activity: sedentary Depression/mood screen: negative Hearing: intact to whispered voice, mild loss bilateral Visual acuity: grossly normal, performs annual eye exam  ADLs: capable Fall risk: low Home safety: good Cognitive evaluation: intact to orientation, naming, recall and repetition EOL planning: adv directives discussed  [place] Office Visit from 06/22/2020 in [place] at [facility]  PHQ-2 Total Score 1      [place] Visit from 10/29/2016 in [place]  PHQ-9 Total Score 7      I have personally reviewed and have noted 1. The patient's medical and social history - reviewed today no changes 2. Their use of alcohol, tobacco or illicit drugs 3. Their current medications and supplements 4. The patient's functional ability including ADL's, fall risks, home safety risks and hearing or visual impairment. 5. Diet and physical activities 6. Evidence for depression or mood disorders 7. Care team reviewed and updated  Patient Care Team: Hoyt Koch, MD as PCP - General (Internal Medicine) Josue Hector, MD as PCP - Cardiology (Cardiology) Josue Hector, MD as Consulting Physician (Cardiology) Hayden Pedro, MD as Consulting Physician (Ophthalmology) Laurence Spates, MD (Inactive) as Consulting Physician (Gastroenterology) Newt Minion, MD as Consulting Physician (Orthopedic Surgery) Danella Sensing, MD as Consulting Physician (Dermatology) Oneida Arenas, DDS (Dentistry) Erline Hau, MD as Consulting Physician (Plastic Surgery) Derek Jack, MD as Consulting Physician (Hematology and Oncology) Past Medical History:  Diagnosis Date   . Atrial fibrillation ([place])    treated with multaq x 6 mos in 2011....[name]=2 (Age; + CHF; no CVA; no DM2). Echo 05/24/10: EF 30% mild AS(mean 62mmgHg); mild LAE; mod LAE; PASP 35 Systolic CHF  . Breast cancer ([place])     in 1987 with a left  mastectomy  . Cardiomyopathy    a. tachy mediated Myoview 06/06/10:  EF 31%; no scar or ischemia; EF up to 50% per echo November 2013  . Diverticulosis   . DJD (degenerative joint disease) of knee    right knee  . Falls   . Hypercholesteremia   . Hypertension   . Lumbar stenosis   . Osteoporosis    history of bone chips in the right knee,previous right fibular fracture, operated on by Dr. Veverly Fells  . RLS (restless legs syndrome)   . Vitamin D deficiency    Past Surgical History:  Procedure Laterality Date  . BREAST RECONSTRUCTION  1988   left breast  . CARDIOVERSION  2012  . KNEE ARTHROSCOPY  2005  . MASTECTOMY, RADICAL  1987   left breast  . WRIST FRACTURE SURGERY     left wrist   Family History  Problem Relation Age of Onset  . Heart attack Father   . Aortic aneurysm Mother   . Breast cancer Sister   . Diabetes Sister   . Stomach cancer Sister    Review of Systems  Constitutional: Negative.   HENT: Negative.   Eyes: Negative.   Respiratory: Negative for cough, chest tightness and shortness of breath.   Cardiovascular: Negative for chest pain, palpitations and leg swelling.  Gastrointestinal: Negative for abdominal distention, abdominal pain, constipation, diarrhea, nausea and vomiting.  Musculoskeletal: Negative.   Skin: Negative.   Neurological:  Negative.   Psychiatric/Behavioral: Negative.     Objective:  Physical Exam Constitutional:      Appearance: She is well-developed and well-nourished.  HENT:     Head: Normocephalic and atraumatic.  Eyes:     Extraocular Movements: EOM normal.  Cardiovascular:     Rate and Rhythm: Normal rate. Rhythm irregular.  Pulmonary:     Effort: Pulmonary effort is normal. No  respiratory distress.     Breath sounds: Normal breath sounds. No wheezing or rales.  Abdominal:     General: Bowel sounds are normal. There is no distension.     Palpations: Abdomen is soft.     Tenderness: There is no abdominal tenderness. There is no rebound.  Musculoskeletal:        General: No edema.     Cervical back: Normal range of motion.  Skin:    General: Skin is warm and dry.  Neurological:     Mental Status: She is alert and oriented to person, place, and time.     Coordination: Coordination abnormal.     Comments: wheelchair  Psychiatric:        Mood and Affect: Mood and affect normal.     Vitals:   06/22/20 1303  BP: 130/80  Pulse: 80  Resp: 18  Temp: 98.2 F (36.8 C)  TempSrc: Oral  SpO2: 98%  Weight: 152 lb 12.8 oz (69.3 kg)  Height: 5' 4.75" (1.645 m)   This visit occurred during the SARS-CoV-2 public health emergency.  Safety protocols were in place, including screening questions prior to the visit, additional usage of staff PPE, and extensive cleaning of exam room while observing appropriate contact time as indicated for disinfecting solutions.   Assessment & Plan:

## 2020-06-24 LAB — URINE CULTURE

## 2020-06-25 ENCOUNTER — Other Ambulatory Visit: Payer: Self-pay | Admitting: Internal Medicine

## 2020-06-25 MED ORDER — NITROFURANTOIN MONOHYD MACRO 100 MG PO CAPS: 100.0000 mg | ORAL_CAPSULE | Freq: Two times a day (BID) | ORAL | 0 refills | Status: DC

## 2020-06-26 DIAGNOSIS — M810 Age-related osteoporosis without current pathological fracture: Secondary | ICD-10-CM | POA: Diagnosis not present

## 2020-06-26 DIAGNOSIS — Z9181 History of falling: Secondary | ICD-10-CM | POA: Diagnosis not present

## 2020-06-26 DIAGNOSIS — I10 Essential (primary) hypertension: Secondary | ICD-10-CM | POA: Diagnosis not present

## 2020-06-26 DIAGNOSIS — Z859 Personal history of malignant neoplasm, unspecified: Secondary | ICD-10-CM | POA: Diagnosis not present

## 2020-06-26 DIAGNOSIS — Z7901 Long term (current) use of anticoagulants: Secondary | ICD-10-CM | POA: Diagnosis not present

## 2020-06-26 DIAGNOSIS — E559 Vitamin D deficiency, unspecified: Secondary | ICD-10-CM | POA: Diagnosis not present

## 2020-06-26 DIAGNOSIS — M170 Bilateral primary osteoarthritis of knee: Secondary | ICD-10-CM | POA: Diagnosis not present

## 2020-06-29 DIAGNOSIS — Z7901 Long term (current) use of anticoagulants: Secondary | ICD-10-CM | POA: Diagnosis not present

## 2020-06-29 DIAGNOSIS — M170 Bilateral primary osteoarthritis of knee: Secondary | ICD-10-CM | POA: Diagnosis not present

## 2020-06-29 DIAGNOSIS — Z9181 History of falling: Secondary | ICD-10-CM | POA: Diagnosis not present

## 2020-06-29 DIAGNOSIS — E559 Vitamin D deficiency, unspecified: Secondary | ICD-10-CM | POA: Diagnosis not present

## 2020-06-29 DIAGNOSIS — I10 Essential (primary) hypertension: Secondary | ICD-10-CM | POA: Diagnosis not present

## 2020-06-29 DIAGNOSIS — M810 Age-related osteoporosis without current pathological fracture: Secondary | ICD-10-CM | POA: Diagnosis not present

## 2020-06-29 DIAGNOSIS — Z859 Personal history of malignant neoplasm, unspecified: Secondary | ICD-10-CM | POA: Diagnosis not present

## 2020-07-02 ENCOUNTER — Other Ambulatory Visit (HOSPITAL_COMMUNITY): Payer: Medicare PPO

## 2020-07-04 DIAGNOSIS — M810 Age-related osteoporosis without current pathological fracture: Secondary | ICD-10-CM | POA: Diagnosis not present

## 2020-07-04 DIAGNOSIS — M170 Bilateral primary osteoarthritis of knee: Secondary | ICD-10-CM | POA: Diagnosis not present

## 2020-07-04 DIAGNOSIS — Z7901 Long term (current) use of anticoagulants: Secondary | ICD-10-CM | POA: Diagnosis not present

## 2020-07-04 DIAGNOSIS — Z9181 History of falling: Secondary | ICD-10-CM | POA: Diagnosis not present

## 2020-07-04 DIAGNOSIS — I10 Essential (primary) hypertension: Secondary | ICD-10-CM | POA: Diagnosis not present

## 2020-07-04 DIAGNOSIS — E559 Vitamin D deficiency, unspecified: Secondary | ICD-10-CM | POA: Diagnosis not present

## 2020-07-04 DIAGNOSIS — Z859 Personal history of malignant neoplasm, unspecified: Secondary | ICD-10-CM | POA: Diagnosis not present

## 2020-07-05 DIAGNOSIS — Z9181 History of falling: Secondary | ICD-10-CM | POA: Diagnosis not present

## 2020-07-05 DIAGNOSIS — M810 Age-related osteoporosis without current pathological fracture: Secondary | ICD-10-CM | POA: Diagnosis not present

## 2020-07-05 DIAGNOSIS — Z859 Personal history of malignant neoplasm, unspecified: Secondary | ICD-10-CM | POA: Diagnosis not present

## 2020-07-05 DIAGNOSIS — E559 Vitamin D deficiency, unspecified: Secondary | ICD-10-CM | POA: Diagnosis not present

## 2020-07-05 DIAGNOSIS — M170 Bilateral primary osteoarthritis of knee: Secondary | ICD-10-CM | POA: Diagnosis not present

## 2020-07-05 DIAGNOSIS — I10 Essential (primary) hypertension: Secondary | ICD-10-CM | POA: Diagnosis not present

## 2020-07-05 DIAGNOSIS — Z7901 Long term (current) use of anticoagulants: Secondary | ICD-10-CM | POA: Diagnosis not present

## 2020-07-08 DIAGNOSIS — Z859 Personal history of malignant neoplasm, unspecified: Secondary | ICD-10-CM | POA: Diagnosis not present

## 2020-07-08 DIAGNOSIS — M170 Bilateral primary osteoarthritis of knee: Secondary | ICD-10-CM | POA: Diagnosis not present

## 2020-07-08 DIAGNOSIS — Z9181 History of falling: Secondary | ICD-10-CM | POA: Diagnosis not present

## 2020-07-08 DIAGNOSIS — E559 Vitamin D deficiency, unspecified: Secondary | ICD-10-CM | POA: Diagnosis not present

## 2020-07-08 DIAGNOSIS — Z7901 Long term (current) use of anticoagulants: Secondary | ICD-10-CM | POA: Diagnosis not present

## 2020-07-08 DIAGNOSIS — I10 Essential (primary) hypertension: Secondary | ICD-10-CM | POA: Diagnosis not present

## 2020-07-08 DIAGNOSIS — M810 Age-related osteoporosis without current pathological fracture: Secondary | ICD-10-CM | POA: Diagnosis not present

## 2020-07-12 DIAGNOSIS — M170 Bilateral primary osteoarthritis of knee: Secondary | ICD-10-CM | POA: Diagnosis not present

## 2020-07-12 DIAGNOSIS — I10 Essential (primary) hypertension: Secondary | ICD-10-CM | POA: Diagnosis not present

## 2020-07-12 DIAGNOSIS — Z9181 History of falling: Secondary | ICD-10-CM | POA: Diagnosis not present

## 2020-07-12 DIAGNOSIS — E559 Vitamin D deficiency, unspecified: Secondary | ICD-10-CM | POA: Diagnosis not present

## 2020-07-12 DIAGNOSIS — Z7901 Long term (current) use of anticoagulants: Secondary | ICD-10-CM | POA: Diagnosis not present

## 2020-07-12 DIAGNOSIS — Z859 Personal history of malignant neoplasm, unspecified: Secondary | ICD-10-CM | POA: Diagnosis not present

## 2020-07-12 DIAGNOSIS — M810 Age-related osteoporosis without current pathological fracture: Secondary | ICD-10-CM | POA: Diagnosis not present

## 2020-07-17 DIAGNOSIS — Z9181 History of falling: Secondary | ICD-10-CM | POA: Diagnosis not present

## 2020-07-17 DIAGNOSIS — Z7901 Long term (current) use of anticoagulants: Secondary | ICD-10-CM | POA: Diagnosis not present

## 2020-07-17 DIAGNOSIS — Z859 Personal history of malignant neoplasm, unspecified: Secondary | ICD-10-CM | POA: Diagnosis not present

## 2020-07-17 DIAGNOSIS — I10 Essential (primary) hypertension: Secondary | ICD-10-CM | POA: Diagnosis not present

## 2020-07-17 DIAGNOSIS — M810 Age-related osteoporosis without current pathological fracture: Secondary | ICD-10-CM | POA: Diagnosis not present

## 2020-07-17 DIAGNOSIS — M170 Bilateral primary osteoarthritis of knee: Secondary | ICD-10-CM | POA: Diagnosis not present

## 2020-07-17 DIAGNOSIS — E559 Vitamin D deficiency, unspecified: Secondary | ICD-10-CM | POA: Diagnosis not present

## 2020-07-19 DIAGNOSIS — S8265XA Nondisplaced fracture of lateral malleolus of left fibula, initial encounter for closed fracture: Secondary | ICD-10-CM | POA: Diagnosis not present

## 2020-07-26 DIAGNOSIS — M810 Age-related osteoporosis without current pathological fracture: Secondary | ICD-10-CM | POA: Diagnosis not present

## 2020-07-26 DIAGNOSIS — M170 Bilateral primary osteoarthritis of knee: Secondary | ICD-10-CM | POA: Diagnosis not present

## 2020-07-26 DIAGNOSIS — Z9181 History of falling: Secondary | ICD-10-CM | POA: Diagnosis not present

## 2020-07-26 DIAGNOSIS — Z7901 Long term (current) use of anticoagulants: Secondary | ICD-10-CM | POA: Diagnosis not present

## 2020-07-26 DIAGNOSIS — I10 Essential (primary) hypertension: Secondary | ICD-10-CM | POA: Diagnosis not present

## 2020-07-26 DIAGNOSIS — Z859 Personal history of malignant neoplasm, unspecified: Secondary | ICD-10-CM | POA: Diagnosis not present

## 2020-07-26 DIAGNOSIS — E559 Vitamin D deficiency, unspecified: Secondary | ICD-10-CM | POA: Diagnosis not present

## 2020-08-03 DIAGNOSIS — Z7901 Long term (current) use of anticoagulants: Secondary | ICD-10-CM | POA: Diagnosis not present

## 2020-08-03 DIAGNOSIS — E559 Vitamin D deficiency, unspecified: Secondary | ICD-10-CM | POA: Diagnosis not present

## 2020-08-03 DIAGNOSIS — Z859 Personal history of malignant neoplasm, unspecified: Secondary | ICD-10-CM | POA: Diagnosis not present

## 2020-08-03 DIAGNOSIS — M810 Age-related osteoporosis without current pathological fracture: Secondary | ICD-10-CM | POA: Diagnosis not present

## 2020-08-03 DIAGNOSIS — Z9181 History of falling: Secondary | ICD-10-CM | POA: Diagnosis not present

## 2020-08-03 DIAGNOSIS — I10 Essential (primary) hypertension: Secondary | ICD-10-CM | POA: Diagnosis not present

## 2020-08-03 DIAGNOSIS — M170 Bilateral primary osteoarthritis of knee: Secondary | ICD-10-CM | POA: Diagnosis not present

## 2020-08-07 DIAGNOSIS — M170 Bilateral primary osteoarthritis of knee: Secondary | ICD-10-CM | POA: Diagnosis not present

## 2020-08-07 DIAGNOSIS — Z9181 History of falling: Secondary | ICD-10-CM | POA: Diagnosis not present

## 2020-08-07 DIAGNOSIS — I10 Essential (primary) hypertension: Secondary | ICD-10-CM | POA: Diagnosis not present

## 2020-08-07 DIAGNOSIS — M810 Age-related osteoporosis without current pathological fracture: Secondary | ICD-10-CM | POA: Diagnosis not present

## 2020-08-07 DIAGNOSIS — E559 Vitamin D deficiency, unspecified: Secondary | ICD-10-CM | POA: Diagnosis not present

## 2020-08-07 DIAGNOSIS — Z859 Personal history of malignant neoplasm, unspecified: Secondary | ICD-10-CM | POA: Diagnosis not present

## 2020-08-07 DIAGNOSIS — Z7901 Long term (current) use of anticoagulants: Secondary | ICD-10-CM | POA: Diagnosis not present

## 2020-08-09 DIAGNOSIS — Z9181 History of falling: Secondary | ICD-10-CM | POA: Diagnosis not present

## 2020-08-09 DIAGNOSIS — M170 Bilateral primary osteoarthritis of knee: Secondary | ICD-10-CM | POA: Diagnosis not present

## 2020-08-09 DIAGNOSIS — Z7901 Long term (current) use of anticoagulants: Secondary | ICD-10-CM | POA: Diagnosis not present

## 2020-08-09 DIAGNOSIS — M810 Age-related osteoporosis without current pathological fracture: Secondary | ICD-10-CM | POA: Diagnosis not present

## 2020-08-09 DIAGNOSIS — Z859 Personal history of malignant neoplasm, unspecified: Secondary | ICD-10-CM | POA: Diagnosis not present

## 2020-08-09 DIAGNOSIS — E559 Vitamin D deficiency, unspecified: Secondary | ICD-10-CM | POA: Diagnosis not present

## 2020-08-09 DIAGNOSIS — I10 Essential (primary) hypertension: Secondary | ICD-10-CM | POA: Diagnosis not present

## 2020-08-16 DIAGNOSIS — S8265XA Nondisplaced fracture of lateral malleolus of left fibula, initial encounter for closed fracture: Secondary | ICD-10-CM | POA: Diagnosis not present

## 2020-08-27 DIAGNOSIS — Z7901 Long term (current) use of anticoagulants: Secondary | ICD-10-CM | POA: Diagnosis not present

## 2020-08-27 DIAGNOSIS — M810 Age-related osteoporosis without current pathological fracture: Secondary | ICD-10-CM | POA: Diagnosis not present

## 2020-08-27 DIAGNOSIS — Z859 Personal history of malignant neoplasm, unspecified: Secondary | ICD-10-CM | POA: Diagnosis not present

## 2020-08-27 DIAGNOSIS — I10 Essential (primary) hypertension: Secondary | ICD-10-CM | POA: Diagnosis not present

## 2020-08-27 DIAGNOSIS — E559 Vitamin D deficiency, unspecified: Secondary | ICD-10-CM | POA: Diagnosis not present

## 2020-08-27 DIAGNOSIS — M170 Bilateral primary osteoarthritis of knee: Secondary | ICD-10-CM | POA: Diagnosis not present

## 2020-08-27 DIAGNOSIS — Z9181 History of falling: Secondary | ICD-10-CM | POA: Diagnosis not present

## 2020-09-06 DIAGNOSIS — I10 Essential (primary) hypertension: Secondary | ICD-10-CM | POA: Diagnosis not present

## 2020-09-06 DIAGNOSIS — Z9181 History of falling: Secondary | ICD-10-CM | POA: Diagnosis not present

## 2020-09-06 DIAGNOSIS — Z7901 Long term (current) use of anticoagulants: Secondary | ICD-10-CM | POA: Diagnosis not present

## 2020-09-06 DIAGNOSIS — M810 Age-related osteoporosis without current pathological fracture: Secondary | ICD-10-CM | POA: Diagnosis not present

## 2020-09-06 DIAGNOSIS — Z859 Personal history of malignant neoplasm, unspecified: Secondary | ICD-10-CM | POA: Diagnosis not present

## 2020-09-06 DIAGNOSIS — E559 Vitamin D deficiency, unspecified: Secondary | ICD-10-CM | POA: Diagnosis not present

## 2020-09-06 DIAGNOSIS — M170 Bilateral primary osteoarthritis of knee: Secondary | ICD-10-CM | POA: Diagnosis not present

## 2020-09-07 DIAGNOSIS — M810 Age-related osteoporosis without current pathological fracture: Secondary | ICD-10-CM | POA: Diagnosis not present

## 2020-09-07 DIAGNOSIS — I10 Essential (primary) hypertension: Secondary | ICD-10-CM | POA: Diagnosis not present

## 2020-09-07 DIAGNOSIS — M170 Bilateral primary osteoarthritis of knee: Secondary | ICD-10-CM | POA: Diagnosis not present

## 2020-09-07 DIAGNOSIS — Z7901 Long term (current) use of anticoagulants: Secondary | ICD-10-CM | POA: Diagnosis not present

## 2020-09-07 DIAGNOSIS — E559 Vitamin D deficiency, unspecified: Secondary | ICD-10-CM | POA: Diagnosis not present

## 2020-09-07 DIAGNOSIS — Z9181 History of falling: Secondary | ICD-10-CM | POA: Diagnosis not present

## 2020-09-07 DIAGNOSIS — Z859 Personal history of malignant neoplasm, unspecified: Secondary | ICD-10-CM | POA: Diagnosis not present

## 2020-09-11 DIAGNOSIS — Z9181 History of falling: Secondary | ICD-10-CM | POA: Diagnosis not present

## 2020-09-11 DIAGNOSIS — M170 Bilateral primary osteoarthritis of knee: Secondary | ICD-10-CM | POA: Diagnosis not present

## 2020-09-11 DIAGNOSIS — Z7901 Long term (current) use of anticoagulants: Secondary | ICD-10-CM | POA: Diagnosis not present

## 2020-09-11 DIAGNOSIS — Z859 Personal history of malignant neoplasm, unspecified: Secondary | ICD-10-CM | POA: Diagnosis not present

## 2020-09-11 DIAGNOSIS — E559 Vitamin D deficiency, unspecified: Secondary | ICD-10-CM | POA: Diagnosis not present

## 2020-09-11 DIAGNOSIS — M810 Age-related osteoporosis without current pathological fracture: Secondary | ICD-10-CM | POA: Diagnosis not present

## 2020-09-11 DIAGNOSIS — I10 Essential (primary) hypertension: Secondary | ICD-10-CM | POA: Diagnosis not present

## 2020-09-16 DIAGNOSIS — S8265XA Nondisplaced fracture of lateral malleolus of left fibula, initial encounter for closed fracture: Secondary | ICD-10-CM | POA: Diagnosis not present

## 2020-09-23 ENCOUNTER — Telehealth: Payer: Medicare PPO | Admitting: Nurse Practitioner

## 2020-09-23 DIAGNOSIS — R059 Cough, unspecified: Secondary | ICD-10-CM

## 2020-09-23 DIAGNOSIS — K219 Gastro-esophageal reflux disease without esophagitis: Secondary | ICD-10-CM | POA: Diagnosis not present

## 2020-09-23 MED ORDER — OMEPRAZOLE 20 MG PO CPDR: 20.0000 mg | DELAYED_RELEASE_CAPSULE | Freq: Two times a day (BID) | ORAL | 0 refills | Status: DC

## 2020-09-23 NOTE — Progress Notes (Signed)
We are sorry that you are not feeling well.  Here is how we plan to help!  Based on your presentation I believe you most likely have A cough due to reflux. To lessen this cough you can use the over-the counter cough medication Delsym but it is very important that we control the cause of the cough.  I suggest that you begin Prilosec 20 mg twice a day for 2 weeks.  You should see the cough lessen quickly as your reflux ( which may be silent or without burning ) is treated.     From your responses in the eVisit questionnaire you describe inflammation in the upper respiratory tract which is causing a significant cough.  This is commonly called Bronchitis and has four common causes:    Allergies  Viral Infections  Acid Reflux  Bacterial Infection Allergies, viruses and acid reflux are treated by controlling symptoms or eliminating the cause. An example might be a cough caused by taking certain blood pressure medications. You stop the cough by changing the medication. Another example might be a cough caused by acid reflux. Controlling the reflux helps control the cough.  I would like you to follow up with your primary care physician this week.  USE OF BRONCHODILATOR ("RESCUE") INHALERS: There is a risk from using your bronchodilator too frequently.  The risk is that over-reliance on a medication which only relaxes the muscles surrounding the breathing tubes can reduce the effectiveness of medications prescribed to reduce swelling and congestion of the tubes themselves.  Although you feel brief relief from the bronchodilator inhaler, your asthma may actually be worsening with the tubes becoming more swollen and filled with mucus.  This can delay other crucial treatments, such as oral steroid medications. If you need to use a bronchodilator inhaler daily, several times per day, you should discuss this with your provider.  There are probably better treatments that could be used to keep your asthma under  control.     HOME CARE . Only take medications as instructed by your medical team. . Complete the entire course of an antibiotic. . Drink plenty of fluids and get plenty of rest. . Avoid close contacts especially the very young and the elderly . Cover your mouth if you cough or cough into your sleeve. . Always remember to wash your hands . A steam or ultrasonic humidifier can help congestion.   GET HELP RIGHT AWAY IF: . You develop worsening fever. . You become short of breath . You cough up blood. . Your symptoms persist after you have completed your treatment plan MAKE SURE YOU   Understand these instructions.  Will watch your condition.  Will get help right away if you are not doing well or get worse.  Your e-visit answers were reviewed by a board certified advanced clinical practitioner to complete your personal care plan.  Depending on the condition, your plan could have included both over the counter or prescription medications. If there is a problem please reply  once you have received a response from your provider. Your safety is important to [place].  If you have drug allergies check your prescription carefully.    You can use MyChart to ask questions about today's visit, request a non-urgent call back, or ask for a work or school excuse for 24 hours related to this e-Visit. If it has been greater than 24 hours you will need to follow up with your provider, or enter a new e-Visit to address those concerns.  You will get an e-mail in the next two days asking about your experience.  I hope that your e-visit has been valuable and will speed your recovery. Thank you for using e-visits.  I have spent at least 5 minutes reviewing and documenting in the patient's chart.

## 2020-10-01 ENCOUNTER — Telehealth: Payer: Medicare PPO | Admitting: Physician Assistant

## 2020-10-01 DIAGNOSIS — J069 Acute upper respiratory infection, unspecified: Secondary | ICD-10-CM | POA: Diagnosis not present

## 2020-10-01 MED ORDER — BENZONATATE 100 MG PO CAPS: 100.0000 mg | ORAL_CAPSULE | Freq: Two times a day (BID) | ORAL | 0 refills | Status: DC | PRN

## 2020-10-01 MED ORDER — AZITHROMYCIN 250 MG PO TABS
ORAL_TABLET | ORAL | 0 refills | Status: DC
Start: 2020-10-01 — End: 2020-10-16

## 2020-10-01 MED ORDER — AZITHROMYCIN 250 MG PO TABS: ORAL_TABLET | ORAL | 0 refills | Status: DC

## 2020-10-01 NOTE — Progress Notes (Signed)
We are sorry that you are not feeling well.  Here is how we plan to help!  Based on your presentation I believe you most likely have A cough due to bacteria.  When patients have a fever and a productive cough with a change in color or increased sputum production, we are concerned about bacterial bronchitis.  If left untreated it can progress to pneumonia.  If your symptoms do not improve with your treatment plan it is important that you contact your provider.   I have prescribed Azithromyin 250 mg: two tablets now and then one tablet daily for 4 additonal days    In addition you may use A prescription cough medication called Tessalon Perles 100mg . You may take 1-2 capsules every 8 hours as needed for your cough.  From your responses in the eVisit questionnaire you describe inflammation in the upper respiratory tract which is causing a significant cough.  This is commonly called Bronchitis and has four common causes:    Allergies  Viral Infections  Acid Reflux  Bacterial Infection Allergies, viruses and acid reflux are treated by controlling symptoms or eliminating the cause. An example might be a cough caused by taking certain blood pressure medications. You stop the cough by changing the medication. Another example might be a cough caused by acid reflux. Controlling the reflux helps control the cough.  USE OF BRONCHODILATOR ("RESCUE") INHALERS: There is a risk from using your bronchodilator too frequently.  The risk is that over-reliance on a medication which only relaxes the muscles surrounding the breathing tubes can reduce the effectiveness of medications prescribed to reduce swelling and congestion of the tubes themselves.  Although you feel brief relief from the bronchodilator inhaler, your asthma may actually be worsening with the tubes becoming more swollen and filled with mucus.  This can delay other crucial treatments, such as oral steroid medications. If you need to use a bronchodilator  inhaler daily, several times per day, you should discuss this with your provider.  There are probably better treatments that could be used to keep your asthma under control.     HOME CARE . Only take medications as instructed by your medical team. . Complete the entire course of an antibiotic. . Drink plenty of fluids and get plenty of rest. . Avoid close contacts especially the very young and the elderly . Cover your mouth if you cough or cough into your sleeve. . Always remember to wash your hands . A steam or ultrasonic humidifier can help congestion.   GET HELP RIGHT AWAY IF: . You develop worsening fever. . You become short of breath . You cough up blood. . Your symptoms persist after you have completed your treatment plan MAKE SURE YOU   Understand these instructions.  Will watch your condition.  Will get help right away if you are not doing well or get worse.  Your e-visit answers were reviewed by a board certified advanced clinical practitioner to complete your personal care plan.  Depending on the condition, your plan could have included both over the counter or prescription medications. If there is a problem please reply  once you have received a response from your provider. Your safety is important to [place].  If you have drug allergies check your prescription carefully.    You can use MyChart to ask questions about today's visit, request a non-urgent call back, or ask for a work or school excuse for 24 hours related to this e-Visit. If it has been greater  than 24 hours you will need to follow up with your provider, or enter a new e-Visit to address those concerns. You will get an e-mail in the next two days asking about your experience.  I hope that your e-visit has been valuable and will speed your recovery. Thank you for using e-visits.  I provided 6 minutes of non face-to-face time during this encounter for chart review and documentation.

## 2020-10-15 ENCOUNTER — Telehealth: Payer: Medicare PPO | Admitting: Physician Assistant

## 2020-10-15 DIAGNOSIS — R059 Cough, unspecified: Secondary | ICD-10-CM

## 2020-10-15 DIAGNOSIS — R079 Chest pain, unspecified: Secondary | ICD-10-CM

## 2020-10-15 NOTE — Progress Notes (Signed)
Based on what you shared with me, I feel your condition warrants further evaluation and I recommend that you be seen in a face to face office visit.  Symptoms then improve and then suddenly worsen again or concerning for deeper or more complicated infections.  In order to safely treat the worsening symptoms, you will need a face-to-face visit for physical exam and potentially chest x-ray and blood work.   NOTE: If you entered your credit card information for this eVisit, you will not be charged. You may see a "hold" on your card for the $35 but that hold will drop off and you will not have a charge processed.   If you are having a true medical emergency please call 911.      For an urgent face to face visit, [facility] has six urgent care centers for your convenience:     [place] [facility] [place] [facility] [place] Get Driving Directions [phone] [address] [place], [place] [address] . 8 am - 4 pm Monday - Friday    [place] [facility]) Get Driving Directions [phone] [address] [place], [place] [address] . 8 am to 8 pm Monday-Friday . 10 am to 6 pm [facility]) Get Driving Directions [phone]  [address] [place],  [place]  [address] . 8 am to 8 pm Monday-Friday . 8 am to 4 pm [facility] Get Driving Directions [phone] [place], [place], [place] [address] . 8 am to 8 pm Monday-Friday . 8 am to 4 pm [facility] at [facility] Get Driving Directions  [phone] [address] [place], [place] [address] . 8 am to 8 pm Monday-Friday . 8 am to 4 pm [facility] at [place] Get Driving Directions [phone] [address] [place], [place] [address] . 8 am to 8 pm  Monday-Friday . 8 am to 4 pm Saturday-Sunday     Your MyChart E-visit questionnaire answers were reviewed by a board certified advanced clinical practitioner to complete your personal care plan based on your specific symptoms.  Thank you for using e-Visits.   Greater than 5 minutes, yet less than 10 minutes of time have been spent researching, coordinating, and implementing care for this patient today

## 2020-10-16 ENCOUNTER — Ambulatory Visit
Admission: RE | Admit: 2020-10-16 | Discharge: 2020-10-16 | Disposition: A | Discharge status: Home / Self Care | Payer: Medicare PPO | Source: Ambulatory Visit | Attending: Family Medicine | Admitting: Family Medicine

## 2020-10-16 ENCOUNTER — Other Ambulatory Visit: Payer: Self-pay

## 2020-10-16 ENCOUNTER — Ambulatory Visit (INDEPENDENT_AMBULATORY_CARE_PROVIDER_SITE_OTHER): Payer: Medicare PPO

## 2020-10-16 VITALS — BP 124/82 | HR 82 | Temp 97.9°F | Resp 18

## 2020-10-16 DIAGNOSIS — M25512 Pain in left shoulder: Secondary | ICD-10-CM

## 2020-10-16 DIAGNOSIS — J22 Unspecified acute lower respiratory infection: Secondary | ICD-10-CM

## 2020-10-16 DIAGNOSIS — R059 Cough, unspecified: Secondary | ICD-10-CM

## 2020-10-16 DIAGNOSIS — W19XXXA Unspecified fall, initial encounter: Secondary | ICD-10-CM

## 2020-10-16 DIAGNOSIS — M19012 Primary osteoarthritis, left shoulder: Secondary | ICD-10-CM | POA: Diagnosis not present

## 2020-10-16 MED ORDER — DEXAMETHASONE SODIUM PHOSPHATE 10 MG/ML IJ SOLN
10.0000 mg | Freq: Once | INTRAMUSCULAR | Status: AC
  Administered 2020-10-16: 10 mg via INTRAMUSCULAR

## 2020-10-16 MED ORDER — DOXYCYCLINE HYCLATE 100 MG PO CAPS: 100.0000 mg | ORAL_CAPSULE | Freq: Two times a day (BID) | ORAL | 0 refills | Status: AC

## 2020-10-16 MED ORDER — PREDNISONE 20 MG PO TABS: 40.0000 mg | ORAL_TABLET | Freq: Every day | ORAL | 0 refills | Status: AC

## 2020-10-16 MED ORDER — AZITHROMYCIN 250 MG PO TABS: 250.0000 mg | ORAL_TABLET | Freq: Every day | ORAL | 0 refills | Status: DC

## 2020-10-16 MED ORDER — GUAIFENESIN-CODEINE 100-10 MG/5ML PO SYRP: 5.0000 mL | ORAL_SOLUTION | Freq: Three times a day (TID) | ORAL | 0 refills | Status: DC | PRN

## 2020-10-16 NOTE — Discharge Instructions (Addendum)
Xray of the left shoulder is negative for fracture or misalignment  I have sent in azithromycin for you to take. Take 2 tablets today, then one tablet daily for the next 4 days.  I have sent in a prednisone taper for you to take for 6 days. 6 tablets on day one, 5 tablets on day two, 4 tablets on day three, 3 tablets on day four, 2 tablets on day five, and 1 tablet on day six.  I have sent in doxycycline for you to take one tablet twice a day for 10 days  I have sent in cough syrup for you to take. This medication can make you sleepy. Do not drive while taking this medication.  Follow up with this office or with primary care if symptoms are persisting.  Follow up in the ER for high fever, trouble swallowing, trouble breathing, other concerning symptoms.

## 2020-10-16 NOTE — ED Triage Notes (Signed)
Productive Cough x 3 weeks.  Has taken a z-pack last week.  Hurts to cough

## 2020-10-22 NOTE — ED Provider Notes (Signed)
[facility]    CSN: [id number] Arrival date & time: 10/16/20  1248      History   Chief Complaint No chief complaint on file.   HPI Pamela Whitaker is a 87 y.o. female.   Reports that she has had a productive cough for the last 3 weeks. States that she was seen on 10/01/20 and was treated with azithromycin and tessalon perles. Reports chest pain with coughing as well. Also reports that she had a fall about a week ago and that her left shoulder has been hurting since then. Has attempted using tylenol for pain with mild temporary relief. Had a video visit earlier today and was told that she should go into an office to be seen for further evaluation and possible need for chest xray. Denies sick contacts. Has negative hx Covid. Has completed Covid vaccines. Denies abdominal pain, nausea, vomiting, diarrhea, rash, fever, other symptoms.  ROS per HPI  The history is provided by the patient.    Past Medical History:  Diagnosis Date  . Atrial fibrillation ([place])    treated with multaq x 6 mos in 2011....[name]=2 (Age; + CHF; no CVA; no DM2). Echo 05/24/10: EF 30% mild AS(mean 82mmgHg); mild LAE; mod LAE; PASP 35 Systolic CHF  . Breast cancer ([place])     in 1987 with a left  mastectomy  . Cardiomyopathy    a. tachy mediated Myoview 06/06/10:  EF 31%; no scar or ischemia; EF up to 50% per echo November 2013  . Diverticulosis   . DJD (degenerative joint disease) of knee    right knee  . Falls   . Hypercholesteremia   . Hypertension   . Lumbar stenosis   . Osteoporosis    history of bone chips in the right knee,previous right fibular fracture, operated on by Dr. Veverly Fells  . RLS (restless legs syndrome)   . Vitamin D deficiency     Patient Active Problem List   Diagnosis Date Noted  . Leukocytosis 06/15/2018  . Osteoporosis 03/02/2017  . Urinary incontinence 10/14/2015  . Routine general medical examination at a health care facility 12/06/2014  . HX: breast cancer 06/29/2014   . Hypertension   . Vitamin D deficiency   . Spinal stenosis, lumbar region, with neurogenic claudication 03/28/2013    Class: Chronic  . At high risk for falls 02/23/2013  . Systolic heart failure ([place]) 06/07/2010  . Congestive dilated cardiomyopathy ([place]) 05/29/2010  . Atrial fibrillation ([place]) 04/16/2009  . Elevated lipids 04/13/2009  . Osteoarthritis 04/13/2009    Past Surgical History:  Procedure Laterality Date  . BREAST RECONSTRUCTION  1988   left breast  . CARDIOVERSION  2012  . KNEE ARTHROSCOPY  2005  . MASTECTOMY, RADICAL  1987   left breast  . WRIST FRACTURE SURGERY     left wrist    OB History   No obstetric history on file.      Home Medications    Prior to Admission medications   Medication Sig Start Date End Date Taking? Authorizing Provider  azithromycin (ZITHROMAX) 250 MG tablet Take 1 tablet (250 mg total) by mouth daily. Take first 2 tablets together, then 1 every day until finished. 10/16/20  Yes Faustino Congress, NP  doxycycline (VIBRAMYCIN) 100 MG capsule Take 1 capsule (100 mg total) by mouth 2 (two) times daily for 10 days. 10/16/20 10/26/20 Yes Faustino Congress, NP  guaiFENesin-codeine (ROBITUSSIN AC) 100-10 MG/5ML syrup Take 5 mLs by mouth 3 (three) times daily as needed  for cough. 10/16/20  Yes Faustino Congress, NP  acetaminophen (TYLENOL) 500 MG tablet Take 500 mg by mouth every 6 (six) hours as needed (pt takes 1-2 tablets at night for pain).    [provider]  benzonatate (TESSALON) 100 MG capsule Take 1 capsule (100 mg total) by mouth 2 (two) times daily as needed for cough. 10/01/20   Mar Daring, PA-C  carvedilol (COREG) 6.25 MG tablet TAKE 2 TABLETS BY MOUTH EVERY MORNING AND 1 TABLET EVERY EVENING 04/04/20   Josue Hector, MD  Multiple Vitamin (MULTIVITAMIN) capsule Take 1 capsule by mouth daily. Women's Research scientist (life sciences), Historical, MD  omeprazole (PRILOSEC) 20 MG capsule Take 1 capsule (20 mg total) by mouth 2  (two) times daily before a meal for 14 days. 09/23/20 10/07/20  Kara Dies, NP  XARELTO 20 MG TABS tablet TAKE 1 TABLET (20 MG TOTAL) BY MOUTH DAILY WITH SUPPER. 03/07/20   Josue Hector, MD    Family History Family History  Problem Relation Age of Onset  . Heart attack Father   . Aortic aneurysm Mother   . Breast cancer Sister   . Diabetes Sister   . Stomach cancer Sister     Social History Social History   Tobacco Use  . Smoking status: Never Smoker  . Smokeless tobacco: Never Used  Vaping Use  . Vaping Use: Never used  Substance Use Topics  . Alcohol use: No  . Drug use: No     Allergies   Amiodarone, Lipitor [atorvastatin], and Multaq [dronedarone]   Review of Systems Review of Systems   Physical Exam Triage Vital Signs ED Triage Vitals  Enc Vitals Group     BP 10/16/20 1259 124/82     Pulse Rate 10/16/20 1259 82     Resp 10/16/20 1259 18     Temp 10/16/20 1259 97.9 F (36.6 C)     Temp Source 10/16/20 1259 Oral     SpO2 10/16/20 1259 97 %     Weight --      Height --      Head Circumference --      Peak Flow --      Pain Score 10/16/20 1300 5     Pain Loc --      Pain Edu? --      Excl. in [place]? --    No data found.  Updated Vital Signs BP 124/82 (BP Location: Right Arm)   Pulse 82   Temp 97.9 F (36.6 C) (Oral)   Resp 18   SpO2 97%   Visual Acuity Right Eye Distance:   Left Eye Distance:   Bilateral Distance:    Right Eye Near:   Left Eye Near:    Bilateral Near:     Physical Exam Vitals and nursing note reviewed.  Constitutional:      General: She is not in acute distress.    Appearance: She is well-developed. She is ill-appearing.  HENT:     Head: Normocephalic and atraumatic.     Right Ear: Tympanic membrane, ear canal and external ear normal.     Left Ear: Tympanic membrane, ear canal and external ear normal.     Nose: Congestion present.     Mouth/Throat:     Mouth: Mucous membranes are moist.     Pharynx:  Posterior oropharyngeal erythema present.  Eyes:     Conjunctiva/sclera: Conjunctivae normal.     Pupils: Pupils are equal, round, and reactive  to light.  Cardiovascular:     Rate and Rhythm: Normal rate and regular rhythm.     Heart sounds: Normal heart sounds. No murmur heard.   Pulmonary:     Effort: Pulmonary effort is normal. No respiratory distress.     Breath sounds: No stridor. Wheezing and rhonchi present. No rales.  Chest:     Chest wall: No tenderness.  Abdominal:     General: Bowel sounds are normal. There is no distension.     Palpations: Abdomen is soft. There is no mass.     Tenderness: There is no abdominal tenderness. There is no right CVA tenderness, left CVA tenderness, guarding or rebound.     Hernia: No hernia is present.  Musculoskeletal:        General: Tenderness (anterior aspect of L shoulder) present.     Cervical back: Normal range of motion and neck supple.  Lymphadenopathy:     Cervical: Cervical adenopathy present.  Skin:    General: Skin is warm and dry.     Capillary Refill: Capillary refill takes less than 2 seconds.  Neurological:     General: No focal deficit present.     Mental Status: She is alert and oriented to person, place, and time.  Psychiatric:        Mood and Affect: Mood normal.        Behavior: Behavior normal.        Thought Content: Thought content normal.      UC Treatments / Results  Labs (all labs ordered are listed, but only abnormal results are displayed) Labs Reviewed - No data to display  EKG   Radiology No results found.  Procedures Procedures (including critical care time)  Medications Ordered in UC Medications  dexamethasone (DECADRON) injection 10 mg (10 mg Intramuscular Given 10/16/20 1420)    Initial Impression / Assessment and Plan / UC Course  I have reviewed the triage vital signs and the nursing notes.  Pertinent labs & imaging results that were available during my care of the patient were  reviewed by me and considered in my medical decision making (see chart for details).    Lower Respiratory Infection Acute Left Shoulder Pain Fall, Initial Encounter  Chest xray today shows interstitial thickening with possible recurrent atypical infection Will treat with azsithromycin, doxycycline, steroid taper and cheratussin cough syrup Sedation precaution given Decadron 10mg  IM given in office today Follow up with this office or with PCP if symptoms are persisting Follow up in the ER for high fever, trouble swallowing, trouble breathing, other concerning symptoms  Left shoulder xray negative for fracture or misalignment May continue to use tylenol prn pain Follow up with orthopedics if symptoms are persisting  Final Clinical Impressions(s) / UC Diagnoses   Final diagnoses:  Lower respiratory infection  Acute pain of left shoulder  Fall, initial encounter     Discharge Instructions     Xray of the left shoulder is negative for fracture or misalignment  I have sent in azithromycin for you to take. Take 2 tablets today, then one tablet daily for the next 4 days.  I have sent in a prednisone taper for you to take for 6 days. 6 tablets on day one, 5 tablets on day two, 4 tablets on day three, 3 tablets on day four, 2 tablets on day five, and 1 tablet on day six.  I have sent in doxycycline for you to take one tablet twice a day for 10 days  I have sent in cough syrup for you to take. This medication can make you sleepy. Do not drive while taking this medication.  Follow up with this office or with primary care if symptoms are persisting.  Follow up in the ER for high fever, trouble swallowing, trouble breathing, other concerning symptoms.     ED Prescriptions    Medication Sig Dispense Auth. Provider   azithromycin (ZITHROMAX) 250 MG tablet Take 1 tablet (250 mg total) by mouth daily. Take first 2 tablets together, then 1 every day until finished. 6 tablet Faustino Congress, NP   doxycycline (VIBRAMYCIN) 100 MG capsule Take 1 capsule (100 mg total) by mouth 2 (two) times daily for 10 days. 20 capsule Faustino Congress, NP   predniSONE (DELTASONE) 20 MG tablet Take 2 tablets (40 mg total) by mouth daily with breakfast for 5 days. 10 tablet Faustino Congress, NP   guaiFENesin-codeine (ROBITUSSIN AC) 100-10 MG/5ML syrup Take 5 mLs by mouth 3 (three) times daily as needed for cough. 24 each Faustino Congress, NP     PDMP not reviewed this encounter.   Faustino Congress, NP 10/22/20 [phone]

## 2020-12-03 ENCOUNTER — Other Ambulatory Visit: Payer: Self-pay

## 2020-12-03 ENCOUNTER — Ambulatory Visit (HOSPITAL_COMMUNITY)
Admission: RE | Admit: 2020-12-03 | Discharge: 2020-12-03 | Disposition: A | Discharge status: Home / Self Care | Payer: Medicare PPO | Source: Ambulatory Visit | Attending: Oncology | Admitting: Oncology

## 2020-12-03 ENCOUNTER — Ambulatory Visit (HOSPITAL_COMMUNITY): Payer: Medicare PPO

## 2020-12-03 DIAGNOSIS — Z1231 Encounter for screening mammogram for malignant neoplasm of breast: Secondary | ICD-10-CM | POA: Diagnosis not present

## 2020-12-03 DIAGNOSIS — M810 Age-related osteoporosis without current pathological fracture: Secondary | ICD-10-CM | POA: Insufficient documentation

## 2020-12-03 DIAGNOSIS — Z780 Asymptomatic menopausal state: Secondary | ICD-10-CM | POA: Diagnosis not present

## 2020-12-03 DIAGNOSIS — M85831 Other specified disorders of bone density and structure, right forearm: Secondary | ICD-10-CM | POA: Diagnosis not present

## 2021-01-11 NOTE — Progress Notes (Signed)
Cardiology Office Note    Date:  01/16/2021   ID:  Pamela Whitaker, Pamela Whitaker 1932/07/30, MRN [id number]  PCP:  Hoyt Koch, MD  Cardiologist:  Dr. Johnsie Cancel  CC: 6 month follow up  History of Present Illness:   87 y.o. chronic afib CHA2VASC 5 on xarelto. Failed Multaq and amiodarone rate control strategy adoopted  History of NIDCM improved last EF by myovue 03/09/16 62%. Likes to complain About medication and find reasons not to take them with myriad of somatic complaints. Some dementia contributes to problem  No palpitations, dyspnea or chest pain Seems to be compliant with meds   Has bad knees and needs more PT Uses wheel chair rather than walker mostly  Seen in ED 10/16/20 for URI, fall and left shoulder pain Xrays negative and d/c With prednisone and oral antibiotics   Son in law  with her today He lives in [place]   No cardiac issues   Past Medical History:  Diagnosis Date   Atrial fibrillation ([place])    treated with multaq x 6 mos in 2011....[name]=2 (Age; + CHF; no CVA; no DM2). Echo 05/24/10: EF 30% mild AS(mean 71mgHg); mild LAE; mod LAE; PASP 35 Systolic CHF   Breast cancer ([place]     in 1987 with a left  mastectomy   Cardiomyopathy    a. tachy mediated Myoview 06/06/10:  EF 31%; no scar or ischemia; EF up to 50% per echo November 2013   Diverticulosis    DJD (degenerative joint disease) of knee    right knee   Falls    Hypercholesteremia    Hypertension    Lumbar stenosis    Osteoporosis    history of bone chips in the right knee,previous right fibular fracture, operated on by Dr. NVeverly Fells  RLS (restless legs syndrome)    Vitamin D deficiency     Past Surgical History:  Procedure Laterality Date   BREAST RECONSTRUCTION  1988   left breast   CARDIOVERSION  2012   KNEE ARTHROSCOPY  2005   MASTECTOMY, RADICAL  1987   left breast   WRIST FRACTURE SURGERY     left wrist    Current Medications: Outpatient Medications Prior to Visit  Medication Sig  Dispense Refill   acetaminophen (TYLENOL) 500 MG tablet Take 500 mg by mouth every 6 (six) hours as needed (pt takes 1-2 tablets at night for pain).     carvedilol (COREG) 6.25 MG tablet TAKE 2 TABLETS BY MOUTH EVERY MORNING AND 1 TABLET EVERY EVENING 270 tablet 3   Multiple Vitamin (MULTIVITAMIN) capsule Take 1 capsule by mouth daily. [employer]     XARELTO 20 MG TABS tablet TAKE 1 TABLET (20 MG TOTAL) BY MOUTH DAILY WITH SUPPER. 90 tablet 0   azithromycin (ZITHROMAX) 250 MG tablet Take 1 tablet (250 mg total) by mouth daily. Take first 2 tablets together, then 1 every day until finished. (Patient not taking: Reported on 01/16/2021) 6 tablet 0   benzonatate (TESSALON) 100 MG capsule Take 1 capsule (100 mg total) by mouth 2 (two) times daily as needed for cough. (Patient not taking: Reported on 01/16/2021) 20 capsule 0   guaiFENesin-codeine (ROBITUSSIN AC) 100-10 MG/5ML syrup Take 5 mLs by mouth 3 (three) times daily as needed for cough. (Patient not taking: Reported on 01/16/2021) 236 each 0   omeprazole (PRILOSEC) 20 MG capsule Take 1 capsule (20 mg total) by mouth 2 (two) times daily before a meal for 14 days. 30 capsule  0   No facility-administered medications prior to visit.     Allergies:   Amiodarone, Lipitor [atorvastatin], and Multaq [dronedarone]   Social History   Socioeconomic History   Marital status: Married    Spouse name: Magazine features editor   Number of children: 2   Years of education: college   Highest education level: Not on file  Occupational History   Occupation: retired    Fish farm manager: RETIRED  Tobacco Use   Smoking status: Never   Smokeless tobacco: Never  Vaping Use   Vaping Use: Never used  Substance and Sexual Activity   Alcohol use: No   Drug use: No   Sexual activity: Never  Other Topics Concern   Not on file  Social History Narrative   Not on file   Social Determinants of Health   Financial Resource Strain: Not on file  Food Insecurity: Not on file   Transportation Needs: Not on file  Physical Activity: Not on file  Stress: Not on file  Social Connections: Not on file     Family History:  The patient's family history includes Aortic aneurysm in her mother; Breast cancer in her sister; Diabetes in her sister; Heart attack in her father; Stomach cancer in her sister.     ROS:   Please see the history of present illness.    ROS All other systems reviewed and are negative.   PHYSICAL EXAM:   VS:  BP 128/82   Pulse 93   Ht _0  (1.626 m)   Wt 66.2 kg   SpO2 97%   BMI 25.06 kg/m    Affect appropriate Healthy:  appears stated age 45: normal Neck supple with no adenopathy JVP normal no bruits no thyromegaly Lungs clear with no wheezing and good diaphragmatic motion Heart:  S1/S2 no murmur, no rub, gallop or click PMI normal post left breast mastectomy with implant  Abdomen: benighn, BS positve, no tenderness, no AAA no bruit.  No HSM or HJR Distal pulses intact with no bruits No edema Neuro non-focal Skin warm and dry No muscular weakness   Wt Readings from Last 3 Encounters:  01/16/21 66.2 kg  06/22/20 69.3 kg  03/01/20 71.8 kg      Studies/Labs Reviewed:   EKG:  03/01/20 afib rate 77 otherwise normal   Recent Labs: 05/31/2020: ALT 13; BUN 9; Creatinine, Ser 0.65; Hemoglobin 11.8; Platelets 236; Potassium 3.9; Sodium 137   Lipid Panel    Component Value Date/Time   CHOL 203 (H) 06/22/2020 1341   TRIG 202.0 (H) 06/22/2020 1341   HDL 49.90 06/22/2020 1341   CHOLHDL 4 06/22/2020 1341   VLDL 40.4 (H) 06/22/2020 1341   LDLCALC 171 (H) 03/20/2015 1351   LDLDIRECT 115.0 06/22/2020 1341    Additional studies/ records that were reviewed today include:  2D ECHO: 04/21/2012 LV EF: 50% -   55% Study Conclusion - Left ventricle: The cavity size was normal. Wall thickness   was normal. Systolic function was normal. The estimated   ejection fraction was in the range of 50% to 55%. Wall   motion was normal;  there were no regional wall motion   abnormalities. - Aortic valve: There was very mild stenosis. Mild   regurgitation. - Mitral valve: Mild regurgitation. - Atrial septum: No defect or patent foramen ovale was   identified. - Tricuspid valve: Moderate regurgitation.   ASSESSMENT & PLAN:   Chronic atrial fibrillation: continue Xarelto 76m daily. CHADSVASC score at least 5 (CHF, HTN, age, F sex).  Rate well controlled on Coreg 12.68m BID.  She attributes many different sx to Xarelto which I think is unrelated   Fatigue: labs and TSH ok f/u pirmary   DOE: functional EF normal by myovue 2017 see above will update echo   HTN: BP well controlled on current regimen   HLD: lipids followed by PCP.  TC 203   Hx of NICM: EF improved back to low normal in 2013. Appears euvolemic. EF 62% myovue 03/10/16 will update echo  Back Pain: referred to Dr NSherwood Gamblerby primary she stopped fosamax as she thinks it made pain worse   F/U in a year Echo for history of DCM   PJenkins Rouge

## 2021-01-14 DIAGNOSIS — H2513 Age-related nuclear cataract, bilateral: Secondary | ICD-10-CM | POA: Diagnosis not present

## 2021-01-16 ENCOUNTER — Encounter: Payer: Self-pay | Admitting: Cardiovascular Disease

## 2021-01-16 ENCOUNTER — Other Ambulatory Visit: Payer: Self-pay

## 2021-01-16 ENCOUNTER — Ambulatory Visit (INDEPENDENT_AMBULATORY_CARE_PROVIDER_SITE_OTHER): Payer: Medicare PPO | Admitting: Cardiovascular Disease

## 2021-01-16 VITALS — BP 128/82 | HR 93 | Ht 64.0 in | Wt 146.0 lb

## 2021-01-16 DIAGNOSIS — I482 Chronic atrial fibrillation, unspecified: Secondary | ICD-10-CM

## 2021-01-16 DIAGNOSIS — I420 Dilated cardiomyopathy: Secondary | ICD-10-CM

## 2021-01-16 NOTE — Patient Instructions (Addendum)
Medication Instructions:  *If you need a refill on your cardiac medications before your next appointment, please call your pharmacy*  Lab Work: If you have labs (blood work) drawn today and your tests are completely normal, you will receive your results only by: [place] (if you have MyChart) OR A paper copy in the mail If you have any lab test that is abnormal or we need to change your treatment, we will call you to review the results.  Testing/Procedures: Your physician has requested that you have an echocardiogram. Echocardiography is a painless test that uses sound waves to create images of your heart. It provides your doctor with information about the size and shape of your heart and how well your heart's chambers and valves are working. This procedure takes approximately one hour. There are no restrictions for this procedure.  Follow-Up: At [facility], you and your health needs are our priority.  As part of our continuing mission to provide you with exceptional heart care, we have created designated Provider Care Teams.  These Care Teams include your primary Cardiologist (physician) and Advanced Practice Providers (APPs -  Physician Assistants and Nurse Practitioners) who all work together to provide you with the care you need, when you need it.  We recommend signing up for the patient portal called "MyChart".  Sign up information is provided on this After Visit Summary.  MyChart is used to connect with patients for Virtual Visits (Telemedicine).  Patients are able to view lab/test results, encounter notes, upcoming appointments, etc.  Non-urgent messages can be sent to your provider as well.   To learn more about what you can do with MyChart, go to [email or web address].    Your next appointment:   12 month(s)  The format for your next appointment:   In Person  Provider:   You may see Jenkins Rouge, MD or one of the following Advanced Practice Providers on your designated  Care Team:   Cecilie Kicks, NP

## 2021-01-21 ENCOUNTER — Other Ambulatory Visit: Payer: Self-pay | Admitting: Cardiovascular Disease

## 2021-01-21 DIAGNOSIS — I482 Chronic atrial fibrillation, unspecified: Secondary | ICD-10-CM

## 2021-01-21 NOTE — Telephone Encounter (Signed)
Pt last saw Dr Johnsie Cancel 01/16/21, last labs Creat 0.65, age 87, weight 66.2kg, CrCl 64.93, based on CrCl pt is on appropriate dosage of Xarelto '20mg'$  QD for fib.  Will refill rx.

## 2021-02-05 ENCOUNTER — Other Ambulatory Visit: Payer: Self-pay

## 2021-02-05 ENCOUNTER — Ambulatory Visit (HOSPITAL_COMMUNITY): Payer: Medicare PPO | Attending: Internal Medicine

## 2021-02-05 DIAGNOSIS — I482 Chronic atrial fibrillation, unspecified: Secondary | ICD-10-CM | POA: Diagnosis not present

## 2021-02-05 DIAGNOSIS — I420 Dilated cardiomyopathy: Secondary | ICD-10-CM | POA: Diagnosis not present

## 2021-02-05 LAB — ECHOCARDIOGRAM COMPLETE
AR max vel: 1.01 cm2
AV Area VTI: 1.13 cm2
AV Area mean vel: 1.05 cm2
AV Mean grad: 12 mmHg
AV Peak grad: 22.8 mmHg
Ao pk vel: 2.39 m/s
S' Lateral: 3.6 cm

## 2021-02-05 MED ORDER — PERFLUTREN LIPID MICROSPHERE
1.0000 mL | INTRAVENOUS | Status: AC | PRN
  Administered 2021-02-05: 1 mL via INTRAVENOUS

## 2021-02-07 NOTE — Progress Notes (Signed)
EF normal AV sclerosis and just mild AR overall good.

## 2021-02-21 NOTE — Telephone Encounter (Signed)
error 

## 2021-03-01 ENCOUNTER — Encounter: Payer: Self-pay | Admitting: Nurse Practitioner

## 2021-03-01 ENCOUNTER — Ambulatory Visit (INDEPENDENT_AMBULATORY_CARE_PROVIDER_SITE_OTHER): Payer: Medicare PPO | Admitting: Nurse Practitioner

## 2021-03-01 ENCOUNTER — Other Ambulatory Visit: Payer: Medicare PPO

## 2021-03-01 ENCOUNTER — Ambulatory Visit (INDEPENDENT_AMBULATORY_CARE_PROVIDER_SITE_OTHER): Payer: Medicare PPO

## 2021-03-01 ENCOUNTER — Other Ambulatory Visit: Payer: Self-pay

## 2021-03-01 VITALS — BP 126/76 | HR 70 | Temp 97.6°F | Ht 64.0 in

## 2021-03-01 DIAGNOSIS — R1900 Intra-abdominal and pelvic swelling, mass and lump, unspecified site: Secondary | ICD-10-CM | POA: Diagnosis not present

## 2021-03-01 DIAGNOSIS — R1031 Right lower quadrant pain: Secondary | ICD-10-CM

## 2021-03-01 DIAGNOSIS — R1903 Right lower quadrant abdominal swelling, mass and lump: Secondary | ICD-10-CM

## 2021-03-01 DIAGNOSIS — R059 Cough, unspecified: Secondary | ICD-10-CM | POA: Diagnosis not present

## 2021-03-01 DIAGNOSIS — Z23 Encounter for immunization: Secondary | ICD-10-CM

## 2021-03-01 DIAGNOSIS — R053 Chronic cough: Secondary | ICD-10-CM

## 2021-03-01 DIAGNOSIS — I700 Atherosclerosis of aorta: Secondary | ICD-10-CM | POA: Diagnosis not present

## 2021-03-01 LAB — URINALYSIS WITH CULTURE, IF INDICATED
Bilirubin Urine: NEGATIVE
Nitrite: POSITIVE — AB
Specific Gravity, Urine: 1.02 (ref 1.000–1.030)
Total Protein, Urine: NEGATIVE
Urine Glucose: NEGATIVE
Urobilinogen, UA: 0.2 (ref 0.0–1.0)
pH: 5.5 (ref 5.0–8.0)

## 2021-03-01 LAB — COMPREHENSIVE METABOLIC PANEL
ALT: 9 U/L (ref 0–35)
AST: 15 U/L (ref 0–37)
Albumin: 3.8 g/dL (ref 3.5–5.2)
Alkaline Phosphatase: 80 U/L (ref 39–117)
BUN: 12 mg/dL (ref 6–23)
CO2: 30 mEq/L (ref 19–32)
Calcium: 9.5 mg/dL (ref 8.4–10.5)
Chloride: 105 mEq/L (ref 96–112)
Creatinine, Ser: 0.73 mg/dL (ref 0.40–1.20)
GFR: 74.41 mL/min (ref >60.00)
Glucose, Bld: 88 mg/dL (ref 70–99)
Potassium: 3.9 mEq/L (ref 3.5–5.1)
Sodium: 141 mEq/L (ref 135–145)
Total Bilirubin: 0.4 mg/dL (ref 0.2–1.2)
Total Protein: 7 g/dL (ref 6.0–8.3)

## 2021-03-01 LAB — CBC WITH DIFFERENTIAL/PLATELET
Basophils Absolute: 0.1 10*3/uL (ref 0.0–0.1)
Basophils Relative: 0.8 % (ref 0.0–3.0)
Eosinophils Absolute: 0.4 10*3/uL (ref 0.0–0.7)
Eosinophils Relative: 4.8 % (ref 0.0–5.0)
HCT: 37.5 % (ref 36.0–46.0)
Hemoglobin: 12.6 g/dL (ref 12.0–15.0)
Lymphocytes Relative: 20.4 % (ref 12.0–46.0)
Lymphs Abs: 1.5 10*3/uL (ref 0.7–4.0)
MCHC: 33.5 g/dL (ref 30.0–36.0)
MCV: 87.9 fl (ref 78.0–100.0)
Monocytes Absolute: 0.6 10*3/uL (ref 0.1–1.0)
Monocytes Relative: 8.5 % (ref 3.0–12.0)
Neutro Abs: 4.8 10*3/uL (ref 1.4–7.7)
Neutrophils Relative %: 65.5 % (ref 43.0–77.0)
Platelets: 238 10*3/uL (ref 150.0–400.0)
RBC: 4.27 Mil/uL (ref 3.87–5.11)
RDW: 12.7 % (ref 11.5–15.5)
WBC: 7.3 10*3/uL (ref 4.0–10.5)

## 2021-03-01 NOTE — Patient Instructions (Signed)
[place] at [place]: [phone]

## 2021-03-01 NOTE — Progress Notes (Signed)
Subjective:  Patient ID: Pamela Whitaker, female    DOB: 02-10-1933  Age: 87 y.o. MRN: [id number]  CC:  Chief Complaint  Patient presents with   Abdominal Pain      HPI  This patient arrives today for the above.  She arrives today with accompanied by her daughter.  They tell me she is been having right lower quadrant pain for months.  The patient is also concerned because she feels that she is noted a mass in the area.  She tells me the only trigger is that she experiences pain sometimes after she eats food.  She is unable to give me a number from 0-10 but tells me the pain is severe when it occurs.  They will spontaneously resolve.  The daughter reports that there is been bright red blood in patient's stool, however the patient denies this.  Patient has also had some vomiting and this will usually occur after eating but does not happen always.  The patient is able to drink and eat and keep things down most of the time.  Patient tells me she continues to have a bowel movement on a daily basis.  They deny fevers.  Patient also mentions a chronic cough is been going on for a few months as well.  She denies smoking or shortness of breath.  Past Medical History:  Diagnosis Date   Atrial fibrillation ([place])    treated with multaq x 6 mos in 2011....[name]=2 (Age; + CHF; no CVA; no DM2). Echo 05/24/10: EF 30% mild AS(mean 40mgHg); mild LAE; mod LAE; PASP 35 Systolic CHF   Breast cancer ([place]     in 1987 with a left  mastectomy   Cardiomyopathy    a. tachy mediated Myoview 06/06/10:  EF 31%; no scar or ischemia; EF up to 50% per echo November 2013   Diverticulosis    DJD (degenerative joint disease) of knee    right knee   Falls    Hypercholesteremia    Hypertension    Lumbar stenosis    Osteoporosis    history of bone chips in the right knee,previous right fibular fracture, operated on by Dr. NVeverly Fells  RLS (restless legs syndrome)    Vitamin D deficiency       Family History   Problem Relation Age of Onset   Heart attack Father    Aortic aneurysm Mother    Breast cancer Sister    Diabetes Sister    Stomach cancer Sister     Social History   Social History Narrative   Not on file   Social History   Tobacco Use   Smoking status: Never   Smokeless tobacco: Never  Substance Use Topics   Alcohol use: No     Current Meds  Medication Sig   acetaminophen (TYLENOL) 500 MG tablet Take 500 mg by mouth every 6 (six) hours as needed (pt takes 1-2 tablets at night for pain).   azithromycin (ZITHROMAX) 250 MG tablet Take 1 tablet (250 mg total) by mouth daily. Take first 2 tablets together, then 1 every day until finished.   benzonatate (TESSALON) 100 MG capsule Take 1 capsule (100 mg total) by mouth 2 (two) times daily as needed for cough.   carvedilol (COREG) 6.25 MG tablet TAKE 2 TABLETS BY MOUTH EVERY MORNING AND 1 TABLET EVERY EVENING   guaiFENesin-codeine (ROBITUSSIN AC) 100-10 MG/5ML syrup Take 5 mLs by mouth 3 (three) times daily as needed for cough.  Multiple Vitamin (MULTIVITAMIN) capsule Take 1 capsule by mouth daily. Women's Centrum   rivaroxaban (XARELTO) 20 MG TABS tablet TAKE 1 TABLET (20 MG TOTAL) BY MOUTH DAILY WITH SUPPER.    ROS:  See HPI   Objective:   Today's Vitals: BP 126/76 (BP Location: Left Arm, Patient Position: Sitting, Cuff Size: Large)   Pulse 70   Temp 97.6 F (36.4 C) (Oral)   Ht _0  (1.626 m)   SpO2 96%   BMI 25.06 kg/m  Vitals with BMI 03/01/2021 01/16/2021 10/16/2020  Height _1  _2  -  Weight - 146 lbs -  BMI - 28.00 -  Systolic 349 179 150  Diastolic 76 82 82  Pulse 70 93 82     Physical Exam Vitals reviewed.  Constitutional:      General: She is not in acute distress.    Appearance: Normal appearance.  HENT:     Head: Normocephalic and atraumatic.  Neck:     Vascular: No carotid bruit.  Cardiovascular:     Rate and Rhythm: Normal rate and regular rhythm.     Pulses: Normal pulses.     Heart  sounds: Normal heart sounds.  Pulmonary:     Effort: Pulmonary effort is normal.     Breath sounds: Normal breath sounds.  Abdominal:     General: Abdomen is flat. Bowel sounds are normal. There is no distension.     Palpations: Abdomen is soft. There is mass (RLQ).     Tenderness: There is abdominal tenderness (very mild even to deep palpation) in the right lower quadrant. There is no guarding or rebound.     Hernia: No hernia is present.  Skin:    General: Skin is warm and dry.  Neurological:     General: No focal deficit present.     Mental Status: She is alert and oriented to person, place, and time.  Psychiatric:        Mood and Affect: Mood normal.        Behavior: Behavior normal.        Judgment: Judgment normal.         Assessment and Plan   1. Right lower quadrant abdominal pain   2. Flu vaccine need   3. Right lower quadrant abdominal mass   4. Chronic cough      Plan: 1.,  3.  We will refer to gastroenterology for further evaluation.  We will also check CBC, CMP, and urinalysis today.  We will order CT scan of abdomen and pelvis for further evaluation.  Mass noted on exam was quite small and movable.  I am wondering if this is just underlying connective tissue, however with patient's other symptoms I do think it is reasonable to get a CT scan for further evaluation. 2.  Flu vaccine administered today. 4.  We will get chest x-ray for evaluation of chronic cough.   Tests ordered Orders Placed This Encounter  Procedures   CT ABDOMEN PELVIS W WO CONTRAST   DG Chest 2 View   Flu Vaccine QUAD High Dose(Fluad)   Comp Met (CMET)   CBC with Differential/Platelet   Urinalysis with Culture, if indicated   Ambulatory referral to Gastroenterology      No orders of the defined types were placed in this encounter.   Patient to follow-up in 6 months for close monitoring with Dr. Sharlet Salina or sooner as needed.  Ailene Ards, NP

## 2021-03-03 ENCOUNTER — Encounter: Payer: Self-pay | Admitting: Nurse Practitioner

## 2021-03-04 ENCOUNTER — Other Ambulatory Visit: Payer: Self-pay | Admitting: Nurse Practitioner

## 2021-03-04 DIAGNOSIS — N3001 Acute cystitis with hematuria: Secondary | ICD-10-CM

## 2021-03-04 LAB — URINE CULTURE
MICRO NUMBER:: 12475612
SPECIMEN QUALITY:: ADEQUATE

## 2021-03-04 MED ORDER — SULFAMETHOXAZOLE-TRIMETHOPRIM 800-160 MG PO TABS: 1.0000 | ORAL_TABLET | Freq: Two times a day (BID) | ORAL | 0 refills | Status: DC

## 2021-03-04 NOTE — Progress Notes (Signed)
Please call this patient and let her know that her urine culture is positive so I have sent in antibiotics for her to take. I have prescribed Bactrim (sulfamethoxazole/trimpethoprim). She should take 1 tablet by mouth twice a day for 5 days. I will also send some information regarding side effects to her via her mychart. Thank you!

## 2021-03-04 NOTE — Progress Notes (Signed)
Called pt, LVM to discuss.  

## 2021-03-19 ENCOUNTER — Other Ambulatory Visit: Payer: Self-pay

## 2021-03-19 ENCOUNTER — Ambulatory Visit
Admission: RE | Admit: 2021-03-19 | Discharge: 2021-03-19 | Disposition: A | Discharge status: Home / Self Care | Payer: Medicare PPO | Source: Ambulatory Visit | Attending: Nurse Practitioner | Admitting: Nurse Practitioner

## 2021-03-19 DIAGNOSIS — K449 Diaphragmatic hernia without obstruction or gangrene: Secondary | ICD-10-CM | POA: Diagnosis not present

## 2021-03-19 DIAGNOSIS — R1031 Right lower quadrant pain: Secondary | ICD-10-CM

## 2021-03-19 DIAGNOSIS — R1903 Right lower quadrant abdominal swelling, mass and lump: Secondary | ICD-10-CM

## 2021-03-19 DIAGNOSIS — N1330 Unspecified hydronephrosis: Secondary | ICD-10-CM | POA: Diagnosis not present

## 2021-03-19 DIAGNOSIS — N281 Cyst of kidney, acquired: Secondary | ICD-10-CM | POA: Diagnosis not present

## 2021-03-19 DIAGNOSIS — K5730 Diverticulosis of large intestine without perforation or abscess without bleeding: Secondary | ICD-10-CM | POA: Diagnosis not present

## 2021-03-19 MED ORDER — IOPAMIDOL (ISOVUE-370) INJECTION 76%
75.0000 mL | Freq: Once | INTRAVENOUS | Status: AC | PRN
  Administered 2021-03-19: 75 mL via INTRAVENOUS

## 2021-03-20 ENCOUNTER — Telehealth: Payer: Self-pay | Admitting: Internal Medicine

## 2021-03-20 NOTE — Telephone Encounter (Signed)
Olivia Mackie from [facility] calling in  Has some CT results that need to be discussed  Please call [phone]

## 2021-03-21 ENCOUNTER — Other Ambulatory Visit: Payer: Self-pay | Admitting: Internal Medicine

## 2021-03-21 DIAGNOSIS — R933 Abnormal findings on diagnostic imaging of other parts of digestive tract: Secondary | ICD-10-CM

## 2021-03-21 DIAGNOSIS — N1330 Unspecified hydronephrosis: Secondary | ICD-10-CM

## 2021-03-21 NOTE — Telephone Encounter (Signed)
Dr. Sharlet Salina has spoke with the patient in regard to her results.

## 2021-03-22 DIAGNOSIS — N130 Hydronephrosis with ureteropelvic junction obstruction: Secondary | ICD-10-CM | POA: Diagnosis not present

## 2021-03-25 ENCOUNTER — Encounter: Payer: Self-pay | Admitting: Internal Medicine

## 2021-03-26 MED ORDER — AMOXICILLIN-POT CLAVULANATE 875-125 MG PO TABS: 1.0000 | ORAL_TABLET | Freq: Two times a day (BID) | ORAL | 0 refills | Status: DC

## 2021-03-27 ENCOUNTER — Encounter: Payer: Self-pay | Admitting: Nurse Practitioner

## 2021-04-08 ENCOUNTER — Other Ambulatory Visit: Payer: Self-pay | Admitting: Nurse Practitioner

## 2021-04-08 ENCOUNTER — Ambulatory Visit (INDEPENDENT_AMBULATORY_CARE_PROVIDER_SITE_OTHER): Payer: Medicare PPO | Admitting: Nurse Practitioner

## 2021-04-08 ENCOUNTER — Encounter: Payer: Self-pay | Admitting: Nurse Practitioner

## 2021-04-08 ENCOUNTER — Telehealth: Payer: Self-pay

## 2021-04-08 VITALS — BP 120/60 | HR 85 | Ht 64.0 in | Wt 139.0 lb

## 2021-04-08 DIAGNOSIS — K6389 Other specified diseases of intestine: Secondary | ICD-10-CM | POA: Diagnosis not present

## 2021-04-08 MED ORDER — PLENVU 140 G PO SOLR: ORAL | 0 refills | Status: DC

## 2021-04-08 NOTE — Telephone Encounter (Signed)
Request for surgical clearance:     Endoscopy Procedure  What type of surgery is being performed?     Colonoscopy  When is this surgery scheduled?     TBD  What type of clearance is required ?   Pharmacy  Are there any medications that need to be held prior to surgery and how long? Xarelto 2-3 day hold  Practice name and name of physician performing surgery?      [facility]  What is your office phone and fax number?      Phone- [phone]  [phone]  Anesthesia type (None, local, MAC, general) ?       MAC

## 2021-04-08 NOTE — Patient Instructions (Addendum)
If you are age 85 or older, your body mass index should be between 23-30. Your Body mass index is 23.86 kg/m. If this is out of the aforementioned range listed, please consider follow up with your Primary Care Provider.  The [facility] providers would like to encourage you to use [facility] to communicate with providers for non-urgent requests or questions.  Due to long hold times on the telephone, sending your provider a message by [facility] may be faster and more efficient way to get a response. Please allow 48 business hours for a response.  Please remember that this is for non-urgent requests/questions.  RECOMMENDATIONS: Miralax- Dissolve one capful in 8 ounces of water and drink before bed as tolerated to facilitate bowel movements.    Go to the ER if you develop severe vomiting, abdominal pain, or no bowel movement.  It was great seeing you today! Thank you for entrusting me with your care and choosing [facility].  Noralyn Pick, CRNP

## 2021-04-08 NOTE — Progress Notes (Signed)
04/08/2021 JOCILYNN GRADE [id number] 1932-12-06   CHIEF COMPLAINT: RLQ pain, colon mass   HISTORY OF PRESENT ILLNESS:  Wave Calzada. Huebert is an 87 year old female with a past medical history of hypertension, atrial fibrillation on Xarelto, breast cancer, cognitive impairment and GERD. She presents to our office today a referred by Dr. Pricilla Holm for further evaluation regarding an abnormal abdominal/pelvic CT scan  03/19/2021 which identified a 3.5 x 2.6 cm sigmoid mass possible causing a partial obstruction with a large amount of stool seen proximal to the sigmoid mass.  She is accompanied by her daughter/POA Jenny Reichmann who is assisting with obtaining the patient's past medical and symptom history.  She developed intermittent RLQ abdominal pain which started one year ago and progressively worsened over the past  2 to 3 months.  No fevers. Decreased appetite. Her daughter stated she received a prescription for Augmentin for 10 days which her mother completed 1 or 2 days ago and her abdominal pain significantly improved but did not completely abate.  She has nausea with intermittent vomiting.  No hematemesis.  She last vomited a few weeks ago. She has on and off diarrhea with rectal bleeding which she attributes to having hemorrhoids.  She reported passing a soft formed brown bowel movement today.  She intermittently passes 1 or 2 mud like stools.  She underwent 2 colonoscopies in her lifetime, last colonoscopy was approximately 20 years ago and she possibly had 1 polyp removed from the colon at that time.  Further details are unclear.  No known family history of colorectal cancer.  She has a history of GERD symptoms which are well controlled on omeprazole 20 mg daily.  She denies ever having an EGD.  No chest pain, palpitations or shortness of breath.  CBC Latest Ref Rng & Units 03/01/2021 05/31/2020 03/01/2020  WBC 4.0 - 10.5 K/uL 7.3 7.7 7.9  Hemoglobin 12.0 - 15.0 g/dL 12.6 11.8(L) 12.5   Hematocrit 36.0 - 46.0 % 37.5 37.6 36.7  Platelets 150.0 - 400.0 K/uL 238.0 236 108(L)    CMP Latest Ref Rng & Units 03/01/2021 05/31/2020 03/01/2020  Glucose 70 - 99 mg/dL 88 94 82  BUN 6 - 23 mg/dL 12 9 10   Creatinine 0.40 - 1.20 mg/dL 0.73 0.65 0.83  Sodium 135 - 145 mEq/L 141 137 142  Potassium 3.5 - 5.1 mEq/L 3.9 3.9 4.4  Chloride 96 - 112 mEq/L 105 106 105  CO2 19 - 32 mEq/L 30 24 25   Calcium 8.4 - 10.5 mg/dL 9.5 9.2 9.2  Total Protein 6.0 - 8.3 g/dL 7.0 6.8 -  Total Bilirubin 0.2 - 1.2 mg/dL 0.4 0.6 -  Alkaline Phos 39 - 117 U/L 80 63 -  AST 0 - 37 U/L 15 16 -  ALT 0 - 35 U/L 9 13 -    CTAP with contrast 03/19/2021: Abnormal soft tissue density measuring 3.5 x 2.6 cm is noted asymmetrically within the sigmoid colon concerning for colonic malignancy. Alternatively it may represent diverticulitis given that there is moderate wall thickening seen more distally in the sigmoid colon and rectum. This appears to be causing at least partial obstruction, due to the large amount of stool seen more proximally in the colon. Sigmoidoscopy is recommended for further evaluation. These results will be called to the ordering clinician or representative by the Radiologist Assistant, and communication documented in the PACS or zVision Dashboard.  Moderate right hydroureteronephrosis is noted without obstructing calculus. Transition zone is seen in  the pelvis and the cause of obstruction is unknown.  ECHO 02/05/2021: Left ventricular ejection fraction, by estimation, is 55%. The left ventricle has low normal function. The left ventricle has no regional wall motion abnormalities. Left ventricular diastolic parameters are indeterminate due to atrial fibrillation. 1. Right ventricular systolic function is low normal. The right ventricular size is normal. There is normal pulmonary artery systolic pressure. 2. 3. Right atrial size was mildly dilated. The mitral valve is grossly normal. Trivial  mitral valve regurgitation. No evidence of mitral stenosis. 4. 5. Tricuspid valve regurgitation is mild to moderate. The aortic valve is calcified. Aortic valve regurgitation is mild. Mild to moderate aortic valve sclerosis/calcification is present, without aortic stenosis. Aortic valve mean gradient measures 12.0 mmHg. Aortic valve Vmax measures 2.39 m/s. 6. The inferior vena cava is normal in size with greater than 50% respiratory variability, suggesting right atrial pressure of 3 mmHg.   Past Medical History:  Diagnosis Date   Arthritis    Atrial fibrillation ([place])    treated with multaq x 6 mos in 2011....[name]=2 (Age; + CHF; no CVA; no DM2). Echo 05/24/10: EF 30% mild AS(mean 56mmgHg); mild LAE; mod LAE; PASP 35 Systolic CHF   Breast cancer ([place])     in 1987 with a left  mastectomy   Cardiomyopathy    a. tachy mediated Myoview 06/06/10:  EF 31%; no scar or ischemia; EF up to 50% per echo November 2013   Diverticulosis    DJD (degenerative joint disease) of knee    right knee   Falls    GERD (gastroesophageal reflux disease)    Hypercholesteremia    Hypertension    IBS (irritable bowel syndrome)    Lumbar stenosis    Osteoporosis    history of bone chips in the right knee,previous right fibular fracture, operated on by Dr. Veverly Fells   RLS (restless legs syndrome)    Vitamin D deficiency    Past Surgical History:  Procedure Laterality Date   BREAST RECONSTRUCTION  1988   left breast   CARDIOVERSION  2012   KNEE ARTHROSCOPY  2005   MASTECTOMY, RADICAL  1987   left breast   WRIST FRACTURE SURGERY     left wrist   Social History:  Nonsmoker. No alcohol use. No drug use.   Family History: Father MI age 51. Sister with history of stomach cancer. Sister with breast cancer.   Allergies  Allergen Reactions   Amiodarone     Stopped on her own   Lipitor [Atorvastatin]     weakness   Multaq [Dronedarone]     Stopped on her own     Outpatient Encounter Medications as  of 04/08/2021  Medication Sig   acetaminophen (TYLENOL) 500 MG tablet Take 500 mg by mouth every 6 (six) hours as needed (pt takes 1-2 tablets at night for pain).   benzonatate (TESSALON) 100 MG capsule Take 1 capsule (100 mg total) by mouth 2 (two) times daily as needed for cough.   carvedilol (COREG) 6.25 MG tablet TAKE 2 TABLETS BY MOUTH EVERY MORNING AND 1 TABLET EVERY EVENING   Multiple Vitamin (MULTIVITAMIN) capsule Take 1 capsule by mouth daily. Women's Centrum   rivaroxaban (XARELTO) 20 MG TABS tablet TAKE 1 TABLET (20 MG TOTAL) BY MOUTH DAILY WITH SUPPER.   omeprazole (PRILOSEC) 20 MG capsule Take 1 capsule (20 mg total) by mouth 2 (two) times daily before a meal for 14 days.   [DISCONTINUED] amoxicillin-clavulanate (AUGMENTIN) 875-125 MG tablet Take 1  tablet by mouth 2 (two) times daily.   [DISCONTINUED] azithromycin (ZITHROMAX) 250 MG tablet Take 1 tablet (250 mg total) by mouth daily. Take first 2 tablets together, then 1 every day until finished.   [DISCONTINUED] guaiFENesin-codeine (ROBITUSSIN AC) 100-10 MG/5ML syrup Take 5 mLs by mouth 3 (three) times daily as needed for cough.   [DISCONTINUED] sulfamethoxazole-trimethoprim (BACTRIM DS) 800-160 MG tablet Take 1 tablet by mouth 2 (two) times daily.   No facility-administered encounter medications on file as of 04/08/2021.   REVIEW OF SYSTEMS:  Gen: + Fatigue. Denies fever, sweats or chills. No weight loss.  CV: Denies chest pain, palpitations or edema. Resp: Denies cough, shortness of breath of hemoptysis.  GI: See HPI.  GU : Denies urinary burning, blood in urine, increased urinary frequency or incontinence. MS: + Arthritis.  Derm: Denies rash, itchiness, skin lesions or unhealing ulcers. Psych: + Memory issues and anxiety.  Heme: Denies bruising, bleeding. Neuro:  Denies headaches, dizziness or paresthesias. Endo:  Denies any problems with DM, thyroid or adrenal function.  PHYSICAL EXAM: BP 120/60   Pulse 85   Ht 5\' 4"   (1.626 m)   Wt 139 lb (63 kg)   BMI 23.86 kg/m  General: Frail-appearing 87 year old female presents in a wheelchair in no acute distress. Head: Normocephalic and atraumatic. Eyes:  Sclerae non-icteric, conjunctive pink. Ears: Normal auditory acuity. Mouth: Dentition intact. No ulcers or lesions.  Neck: Supple, no lymphadenopathy or thyromegaly.  Lungs: Clear bilaterally to auscultation without wheezes, crackles or rhonchi. Heart: Irregular rhythm.  No murmur, rub or gallop appreciated.  Abdomen: Soft, non distended. RLQ tenderness, LLQ with thickened area with mild tenderness without rebound or guarding. No hepatosplenomegaly. Normoactive bowel sounds x 4 quadrants.  Rectal: Deferred.  Musculoskeletal: Symmetrical with no gross deformities. Skin: Warm and dry. No rash or lesions on visible extremities. Extremities: No edema. Neurological: Alert oriented x 4, no focal deficits.  Psychological:  Alert and cooperative. Normal mood and affect.  ASSESSMENT AND PLAN:  55) 87 year old female with RLQ pain. CTAP 03/19/2021 identified a soft tissue density measuring 3.5 x 2.6 cm in the sigmoid colon, possible diverticulitis with moderate wall thickening in the distal colon and rectum resulting in a partial obstruction. RLQ pain decreased after course of Augmentin prescribed by her PCP. WBC 7.3. Hg 12.6.  -Colonoscopy at [facility] benefits and risks discussed including risk with sedation, risk of bleeding, perforation and infection. Patient is at a higher risk for these complications as discussed with the patient and her daughter. A flexible sigmoidoscopy will be completed if the colonoscope does not pass the area of the sigmoid mass. Colonoscopy tentatively scheduled with Dr. Havery Moros Friday 04/12/2021, await daughter's return call to verify if procedure date accepted. -Our office will contact cardiologist Dr. Johnsie Cancel to verify Xarelto instructions prior to proceeding with colonoscopy -Family and  caregiver to be present day before procedure to assist with the clear liquid diet and bowel preparation which I discussed with her daughter.  -Patient to present to the ED if she develops severe vomiting, worsening abdominal pain, abdominal distention or no bowel movement or gas per the rectum. -Ensure or Boost 2 bottles daily, soft diet as tolerated -Miralax Q HS to facilitate stool output  -Further recommendations to be determined after the above evaluation completed  2) Atrial fibrillation CHA2VASC 5 on Xarelto on Coreg  3) History of nonischemic dilated cardiomyopathy. LV EF 55% per ECHO 01/2021  4) Right hydronephrosis per CTAP 03/19/2021 -Urology referral per PCP  5) GERD, symptoms well controlled on Omeprazole 20 mg daily.     CC:  Hoyt Koch, *

## 2021-04-08 NOTE — Telephone Encounter (Signed)
Covering preop today. Last OV 12/2020. Will route to pharm for anticoag then will need call.

## 2021-04-09 NOTE — Telephone Encounter (Signed)
Patient with diagnosis of afib on Xarelto for anticoagulation.    Procedure: colonoscopy Date of procedure: TBD  CHA2DS2-VASc Score = 5   This indicates a 7.2% annual risk of stroke. The patient's score is based upon: CHF History: 1 HTN History: 1 Diabetes History: 0 Stroke History: 0 Vascular Disease History: 0 Age Score: 2 Gender Score: 1    CrCl 47.7 ml/min  Per office protocol, patient can hold Xarelto for 2 days prior to procedure.

## 2021-04-09 NOTE — Progress Notes (Signed)
Agree with assessment and plan as outlined - will proceed with endoscopic evaluation to confirm diagnosis as soon as the patient is able to.

## 2021-04-10 NOTE — Telephone Encounter (Signed)
Patient's daughter was notified yesterday.

## 2021-04-11 ENCOUNTER — Encounter (HOSPITAL_COMMUNITY): Payer: Self-pay | Admitting: Hematology

## 2021-04-12 ENCOUNTER — Encounter: Payer: Self-pay | Admitting: Gastroenterology

## 2021-04-12 ENCOUNTER — Ambulatory Visit (AMBULATORY_SURGERY_CENTER): Payer: Medicare PPO | Admitting: Gastroenterology

## 2021-04-12 ENCOUNTER — Other Ambulatory Visit: Payer: Self-pay

## 2021-04-12 VITALS — BP 123/63 | HR 80 | Temp 96.6°F | Resp 16 | Ht 64.0 in | Wt 139.0 lb

## 2021-04-12 DIAGNOSIS — C187 Malignant neoplasm of sigmoid colon: Secondary | ICD-10-CM | POA: Diagnosis not present

## 2021-04-12 DIAGNOSIS — K6389 Other specified diseases of intestine: Secondary | ICD-10-CM

## 2021-04-12 DIAGNOSIS — K648 Other hemorrhoids: Secondary | ICD-10-CM | POA: Diagnosis not present

## 2021-04-12 DIAGNOSIS — K5730 Diverticulosis of large intestine without perforation or abscess without bleeding: Secondary | ICD-10-CM

## 2021-04-12 MED ORDER — SODIUM CHLORIDE 0.9 % IV SOLN: 500.0000 mL | Freq: Once | INTRAVENOUS | Status: DC

## 2021-04-12 MED ORDER — FLEET ENEMA 7-19 GM/118ML RE ENEM
1.0000 | ENEMA | Freq: Once | RECTAL | Status: AC
  Administered 2021-04-12: 1 via RECTAL

## 2021-04-12 NOTE — Progress Notes (Signed)
A and O x3. Report to RN. Tolerated MAC anesthesia well. 

## 2021-04-12 NOTE — Patient Instructions (Signed)
Information on polyps given to you today.  Await pathology results.  Resume previous diet and medications.  Resume Xarelto tonight.  Use Miralax twice a day to keep stools loose to prevent obstruction.   YOU HAD AN ENDOSCOPIC PROCEDURE TODAY AT [facility]:   Refer to the procedure report that was given to you for any specific questions about what was found during the examination.  If the procedure report does not answer your questions, please call your gastroenterologist to clarify.  If you requested that your care partner not be given the details of your procedure findings, then the procedure report has been included in a sealed envelope for you to review at your convenience later.  YOU SHOULD EXPECT: Some feelings of bloating in the abdomen. Passage of more gas than usual.  Walking can help get rid of the air that was put into your GI tract during the procedure and reduce the bloating. If you had a lower endoscopy (such as a colonoscopy or flexible sigmoidoscopy) you may notice spotting of blood in your stool or on the toilet paper. If you underwent a bowel prep for your procedure, you may not have a normal bowel movement for a few days.  Please Note:  You might notice some irritation and congestion in your nose or some drainage.  This is from the oxygen used during your procedure.  There is no need for concern and it should clear up in a day or so.  SYMPTOMS TO REPORT IMMEDIATELY:  Following lower endoscopy (colonoscopy or flexible sigmoidoscopy):  Excessive amounts of blood in the stool  Significant tenderness or worsening of abdominal pains  Swelling of the abdomen that is new, acute  Fever of 100F or higher  For urgent or emergent issues, a gastroenterologist can be reached at any hour by calling [phone]. Do not use MyChart messaging for urgent concerns.    DIET:  We do recommend a small meal at first, but then you may proceed to your regular diet.  Drink  plenty of fluids but you should avoid alcoholic beverages for 24 hours.  ACTIVITY:  You should plan to take it easy for the rest of today and you should NOT DRIVE or use heavy machinery until tomorrow (because of the sedation medicines used during the test).    FOLLOW UP: Our staff will call the number listed on your records 48-72 hours following your procedure to check on you and address any questions or concerns that you may have regarding the information given to you following your procedure. If we do not reach you, we will leave a message.  We will attempt to reach you two times.  During this call, we will ask if you have developed any symptoms of COVID 19. If you develop any symptoms (ie: fever, flu-like symptoms, shortness of breath, cough etc.) before then, please call [phone].  If you test positive for Covid 19 in the 2 weeks post procedure, please call and report this information to [place].    If any biopsies were taken you will be contacted by phone or by letter within the next 1-3 weeks.  Please call us at [phone] if you have not heard about the biopsies in 3 weeks.    SIGNATURES/CONFIDENTIALITY: You and/or your care partner have signed paperwork which will be entered into your electronic medical record.  These signatures attest to the fact that that the information above on your After Visit Summary has been reviewed and is understood.  Full responsibility of the confidentiality of this discharge information lies with you and/or your care-partner.  

## 2021-04-12 NOTE — Op Note (Addendum)
[place] Patient Name: Pamela Whitaker Procedure Date: 04/12/2021 10:41 AM MRN: [id number] Endoscopist: Remo Lipps P. Havery Moros , MD Age: 87 Referring MD:  Date of Birth: April 25, 1933 Gender: Female Account #: [id number] Procedure:                Colonoscopy Indications:              Abnormal CT of the GI tract - partially obstructing                            sigmoid mass Medicines:                Monitored Anesthesia Care Procedure:                Pre-Anesthesia Assessment:                           - Prior to the procedure, a History and Physical                            was performed, and patient medications and                            allergies were reviewed. The patient's tolerance of                            previous anesthesia was also reviewed. The risks                            and benefits of the procedure and the sedation                            options and risks were discussed with the patient.                            All questions were answered, and informed consent                            was obtained. Prior Anticoagulants: The patient has                            taken Xarelto (rivaroxaban), last dose was 2 days                            prior to procedure. ASA Grade Assessment: III - A                            patient with severe systemic disease. After                            reviewing the risks and benefits, the patient was                            deemed in satisfactory condition to undergo the  procedure.                           After obtaining informed consent, the colonoscope                            was passed under direct vision. Throughout the                            procedure, the patient's blood pressure, pulse, and                            oxygen saturations were monitored continuously. The                            0405 PCF-H190TL Slim SB Colonoscope was introduced                             through the anus with the intention of advancing to                            the cecum. The scope was advanced to the sigmoid                            colon before the procedure was aborted. Medications                            were given. The colonoscopy was performed without                            difficulty. The patient tolerated the procedure                            well. The quality of the bowel preparation was                            adequate. The rectum was photographed. Scope In: 10:44:45 AM Scope Out: 10:58:05 AM Total Procedure Duration: 0 hours 13 minutes 20 seconds  Findings:                 Prolapsed internal hemorrhoids were found on                            perianal exam and they were manually reduced.                           Multiple medium-mouthed diverticula were found in                            the sigmoid colon.                           A partially obstructing mass was found in the  sigmoid colon. The mass was circumferential. I                            could not see the lumen within the area and it                            could not be traversed. Biopsies were taken with a                            cold forceps for histology. Area distal to the mass                            was tattooed with an injection of Spot (carbon                            black).                           Internal hemorrhoids were found during retroflexion.                           The exam was otherwise without abnormality. Small                            benign appearing hyperplastic polyps noted in the                            rectum / rectosigmoid colon. Of note, minimal air                            and mostly water immersion used for this exam. Complications:            No immediate complications. Estimated blood loss:                            Minimal. Estimated Blood Loss:     Estimated blood loss was  minimal. Impression:               - Hemorrhoids found on perianal exam.                           - Diverticulosis in the sigmoid colon.                           - Partially obstructing mass in the sigmoid colon.                            Biopsied. Tattooed.                           - Internal hemorrhoids.                           - The examination was otherwise normal. Recommendation:           - Patient has a contact  number available for                            emergencies. The signs and symptoms of potential                            delayed complications were discussed with the                            patient. Return to normal activities tomorrow.                            Written discharge instructions were provided to the                            patient.                           - Resume previous diet.                           - Continue present medications.                           - Resume Xarelto tonight                           - Miralax daily to BID to keep stools loose to                            prevent obstruction                           - Await pathology results.                           - Colorectal surgery consult                           - CT chest to complete staging, will discuss with                            patient and family Carlota Raspberry. Daivon Rayos, MD 04/12/2021 11:04:37 AM This report has been signed electronically.

## 2021-04-12 NOTE — Progress Notes (Signed)
History and Physical Interval Note: See clinic note from 04/08/21 for details. No interval changes. Patient has a colon mass in the sigmoid colon on CT scan, done for ongoing lower abdominal pain she has been experiencing. Ideally will do full colonoscopy but it sounds like her prep was not too good. Enema done pre-procedure to clear her out better. If we can do full colonoscopy will do so. If prep is not good or cannot traverse sigmoid mass may just biopsy and do limited exam today. I have discussed risks / benefits of the procedure and anesthesia with the patient and she and daughter wishes to proceed. Last dose of Xarelto 2 days ago. Otherwise she denies complaints today.  04/12/2021 10:33 AM  Pamela Whitaker  has presented today for endoscopic procedure(s), with the diagnosis of  Encounter Diagnosis  Name Primary?   Mass of colon Yes  .  The various methods of evaluation and treatment have been discussed with the patient and/or family. After consideration of risks, benefits and other options for treatment, the patient has consented to  the endoscopic procedure(s).   The patient's history has been reviewed, patient examined, no change in status, stable for surgery.  I have reviewed the patient's chart and labs.  Questions were answered to the patient's satisfaction.    Jolly Mango, MD [facility]

## 2021-04-12 NOTE — Progress Notes (Signed)
Called to room to assist during endoscopic procedure.  Patient ID and intended procedure confirmed with present staff. Received instructions for my participation in the procedure from the performing physician.  

## 2021-04-15 ENCOUNTER — Telehealth: Payer: Self-pay

## 2021-04-15 DIAGNOSIS — K6389 Other specified diseases of intestine: Secondary | ICD-10-CM

## 2021-04-15 NOTE — Telephone Encounter (Signed)
Pamela Whitaker returned your call.  Please call her back.  Thank you.

## 2021-04-15 NOTE — Telephone Encounter (Signed)
Returned call to [place]. We have discussed Dr. Doyne Keel recommendations. She is aware of CT scan appt and instructions. Jenny Reichmann is aware that patient will need to go by the lab today or tomorrow. She is aware that no appt is necessary. Cindy verbalized understanding and had no concerns at the end of the call.

## 2021-04-15 NOTE — Telephone Encounter (Signed)
Lm on vm for Pamela Whitaker to return call.  Urgent referral, records, demographic, and insurance information faxed to [place].  Patient has been scheduled for a CT scan of the chest on Wednesday, 04/17/21 at 5:30 pm. Pt will need to arrive at [facility] by 5:15 pm. Liquids only 4 hours prior to the scan. Pt will need to come in for labs today.

## 2021-04-15 NOTE — Telephone Encounter (Signed)
-----   Message from Yetta Flock, MD sent at 04/12/2021  5:56 PM EST ----- Regarding: colon mass patient Hi Kaid Seeberger,  Can you please refer this patient to [facility] colorectal surgery for ASAP appointment for new colon mass? Can you also help coordinate CT chest with contrast to complete staging, and have her go to lab for CEA level? Thanks  Dr. Loni Muse

## 2021-04-16 ENCOUNTER — Encounter: Payer: Self-pay | Admitting: Internal Medicine

## 2021-04-16 ENCOUNTER — Telehealth (INDEPENDENT_AMBULATORY_CARE_PROVIDER_SITE_OTHER): Payer: Medicare PPO | Admitting: Internal Medicine

## 2021-04-16 ENCOUNTER — Telehealth: Payer: Self-pay | Admitting: *Deleted

## 2021-04-16 ENCOUNTER — Other Ambulatory Visit: Payer: Medicare PPO

## 2021-04-16 DIAGNOSIS — K6389 Other specified diseases of intestine: Secondary | ICD-10-CM | POA: Diagnosis not present

## 2021-04-16 DIAGNOSIS — I10 Essential (primary) hypertension: Secondary | ICD-10-CM | POA: Diagnosis not present

## 2021-04-16 DIAGNOSIS — I4811 Longstanding persistent atrial fibrillation: Secondary | ICD-10-CM | POA: Diagnosis not present

## 2021-04-16 DIAGNOSIS — C187 Malignant neoplasm of sigmoid colon: Secondary | ICD-10-CM | POA: Insufficient documentation

## 2021-04-16 DIAGNOSIS — N134 Hydroureter: Secondary | ICD-10-CM | POA: Diagnosis not present

## 2021-04-16 MED ORDER — DICYCLOMINE HCL 10 MG PO CAPS: 10.0000 mg | ORAL_CAPSULE | Freq: Three times a day (TID) | ORAL | 3 refills | Status: DC

## 2021-04-16 MED ORDER — TRAMADOL HCL 50 MG PO TABS: 50.0000 mg | ORAL_TABLET | Freq: Three times a day (TID) | ORAL | 0 refills | Status: AC | PRN

## 2021-04-16 NOTE — Assessment & Plan Note (Signed)
Recent colonoscopy with biopsy and is obstructive. They are doing miralax daily to help. Rx tramadol and bentyl today to try for pain as she is having significant pain associated with this.

## 2021-04-16 NOTE — Assessment & Plan Note (Signed)
Okay to hold xarelto for surgery and as needed.

## 2021-04-16 NOTE — Progress Notes (Signed)
Virtual Visit via Video Note  I connected with Pamela Whitaker on 04/16/21 at  1:00 PM EST by a video enabled telemedicine application and verified that I am speaking with the correct person using two identifiers.  The patient and the provider were at separate locations throughout the entire encounter. Patient location: home, Provider location: work   I discussed the limitations of evaluation and management by telemedicine and the availability of in person appointments. The patient expressed understanding and agreed to proceed. The patient and the provider were the only parties present for the visit unless noted in HPI below.  History of Present Illness: The patient is a 87 y.o. female with visit for follow up recent medical conditions sigmoid mass and right hydroureter.   Observations/Objective: Appearance: appears ill, awake and alert, in some pain abdomen, breathing is normal  Assessment and Plan: See problem oriented charting  Follow Up Instructions: rx bentyl and tramadol to use prn for pain in the stomach  I discussed the assessment and treatment plan with the patient. The patient was provided an opportunity to ask questions and all were answered. The patient agreed with the plan and demonstrated an understanding of the instructions.   The patient was advised to call back or seek an in-person evaluation if the symptoms worsen or if the condition fails to improve as anticipated.  Hoyt Koch, MD

## 2021-04-16 NOTE — Assessment & Plan Note (Signed)
BP at goal and will continue coreg 12.5 mg qam and 6.25 mg qpm. They will monitor pressure as she is not eating well and if needed we can reduce medication.

## 2021-04-16 NOTE — Assessment & Plan Note (Signed)
Most recent renal function normal. Discussed with daughter this could be chronic. She has been referred to urology and seen for this. They plan to do stent and can coordinate this with surgery for sigmoid mass at same time. This could be a source of her pain on the right lower stomach possibly.

## 2021-04-16 NOTE — Telephone Encounter (Signed)
  Follow up Call-  Call back number 04/12/2021  Post procedure Call Back phone  # [phone]  Permission to leave phone message Yes  Some recent data might be hidden     Patient questions:  Do you have a fever, pain , or abdominal swelling? No. Pain Score  0 at this time.  Daughter states that pt does have abd pain sometimes during the day.  Have you tolerated food without any problems? No.  Have you been able to return to your normal activities? No.  Do you have any questions about your discharge instructions: Diet   No. Medications  No. Follow up visit  Yes.    Do you have questions or concerns about your Care? Yes.    Actions: * If pain score is 4 or above: No action needed, pain <4.  Have you developed a fever since your procedure? no  2.   Have you had an respiratory symptoms (SOB or cough) since your procedure? no  3.   Have you tested positive for COVID 19 since your procedure no  4.   Have you had any family members/close contacts diagnosed with the COVID 19 since your procedure?  no   If yes to any of these questions please route to Joylene John, RN and Joella Prince, RN

## 2021-04-17 ENCOUNTER — Other Ambulatory Visit: Payer: Self-pay

## 2021-04-17 ENCOUNTER — Ambulatory Visit (HOSPITAL_COMMUNITY): Admission: RE | Admit: 2021-04-17 | Payer: Medicare PPO | Source: Ambulatory Visit

## 2021-04-17 DIAGNOSIS — K6389 Other specified diseases of intestine: Secondary | ICD-10-CM

## 2021-04-17 LAB — BUN/CREATININE RATIO
BUN/Creatinine Ratio: 11 (calc) (ref 6–22)
BUN: 21 mg/dL (ref 7–25)
Creat: 1.93 mg/dL — ABNORMAL HIGH (ref 0.60–0.95)
eGFR: 25 mL/min/{1.73_m2} — ABNORMAL LOW (ref >=60)

## 2021-04-17 LAB — CEA: CEA: 72.3 ng/mL — ABNORMAL HIGH

## 2021-04-21 ENCOUNTER — Encounter (HOSPITAL_COMMUNITY): Payer: Self-pay

## 2021-04-21 ENCOUNTER — Emergency Department (HOSPITAL_COMMUNITY): Payer: Medicare PPO

## 2021-04-21 ENCOUNTER — Inpatient Hospital Stay (HOSPITAL_COMMUNITY)
Admission: EM | Admit: 2021-04-21 | Discharge: 2021-04-23 | DRG: 374 | Disposition: A | Discharge status: Hospice | Payer: Medicare PPO | Attending: Surgery | Admitting: Surgery

## 2021-04-21 ENCOUNTER — Telehealth: Payer: Self-pay | Admitting: Gastroenterology

## 2021-04-21 ENCOUNTER — Other Ambulatory Visit: Payer: Self-pay

## 2021-04-21 DIAGNOSIS — Z7901 Long term (current) use of anticoagulants: Secondary | ICD-10-CM

## 2021-04-21 DIAGNOSIS — R451 Restlessness and agitation: Secondary | ICD-10-CM | POA: Diagnosis present

## 2021-04-21 DIAGNOSIS — C189 Malignant neoplasm of colon, unspecified: Secondary | ICD-10-CM

## 2021-04-21 DIAGNOSIS — Z800 Family history of malignant neoplasm of digestive organs: Secondary | ICD-10-CM

## 2021-04-21 DIAGNOSIS — K651 Peritoneal abscess: Secondary | ICD-10-CM | POA: Diagnosis not present

## 2021-04-21 DIAGNOSIS — E876 Hypokalemia: Secondary | ICD-10-CM

## 2021-04-21 DIAGNOSIS — K56609 Unspecified intestinal obstruction, unspecified as to partial versus complete obstruction: Secondary | ICD-10-CM

## 2021-04-21 DIAGNOSIS — Z79899 Other long term (current) drug therapy: Secondary | ICD-10-CM | POA: Diagnosis not present

## 2021-04-21 DIAGNOSIS — N739 Female pelvic inflammatory disease, unspecified: Secondary | ICD-10-CM | POA: Diagnosis present

## 2021-04-21 DIAGNOSIS — M810 Age-related osteoporosis without current pathological fracture: Secondary | ICD-10-CM | POA: Diagnosis present

## 2021-04-21 DIAGNOSIS — Z9012 Acquired absence of left breast and nipple: Secondary | ICD-10-CM

## 2021-04-21 DIAGNOSIS — K219 Gastro-esophageal reflux disease without esophagitis: Secondary | ICD-10-CM | POA: Diagnosis present

## 2021-04-21 DIAGNOSIS — I420 Dilated cardiomyopathy: Secondary | ICD-10-CM | POA: Diagnosis present

## 2021-04-21 DIAGNOSIS — I5022 Chronic systolic (congestive) heart failure: Secondary | ICD-10-CM | POA: Diagnosis present

## 2021-04-21 DIAGNOSIS — E7800 Pure hypercholesterolemia, unspecified: Secondary | ICD-10-CM | POA: Diagnosis present

## 2021-04-21 DIAGNOSIS — K625 Hemorrhage of anus and rectum: Secondary | ICD-10-CM | POA: Diagnosis present

## 2021-04-21 DIAGNOSIS — R0902 Hypoxemia: Secondary | ICD-10-CM | POA: Diagnosis not present

## 2021-04-21 DIAGNOSIS — N131 Hydronephrosis with ureteral stricture, not elsewhere classified: Secondary | ICD-10-CM | POA: Diagnosis present

## 2021-04-21 DIAGNOSIS — I4891 Unspecified atrial fibrillation: Secondary | ICD-10-CM | POA: Diagnosis not present

## 2021-04-21 DIAGNOSIS — I110 Hypertensive heart disease with heart failure: Secondary | ICD-10-CM | POA: Diagnosis present

## 2021-04-21 DIAGNOSIS — R970 Elevated carcinoembryonic antigen [CEA]: Secondary | ICD-10-CM | POA: Diagnosis present

## 2021-04-21 DIAGNOSIS — Z853 Personal history of malignant neoplasm of breast: Secondary | ICD-10-CM

## 2021-04-21 DIAGNOSIS — Z515 Encounter for palliative care: Secondary | ICD-10-CM

## 2021-04-21 DIAGNOSIS — K529 Noninfective gastroenteritis and colitis, unspecified: Secondary | ICD-10-CM | POA: Diagnosis not present

## 2021-04-21 DIAGNOSIS — Z888 Allergy status to other drugs, medicaments and biological substances status: Secondary | ICD-10-CM | POA: Diagnosis not present

## 2021-04-21 DIAGNOSIS — Z20822 Contact with and (suspected) exposure to covid-19: Secondary | ICD-10-CM | POA: Diagnosis not present

## 2021-04-21 DIAGNOSIS — R1084 Generalized abdominal pain: Secondary | ICD-10-CM | POA: Diagnosis not present

## 2021-04-21 DIAGNOSIS — C187 Malignant neoplasm of sigmoid colon: Secondary | ICD-10-CM | POA: Diagnosis not present

## 2021-04-21 DIAGNOSIS — C786 Secondary malignant neoplasm of retroperitoneum and peritoneum: Secondary | ICD-10-CM | POA: Diagnosis present

## 2021-04-21 DIAGNOSIS — K631 Perforation of intestine (nontraumatic): Secondary | ICD-10-CM | POA: Diagnosis not present

## 2021-04-21 DIAGNOSIS — Z8249 Family history of ischemic heart disease and other diseases of the circulatory system: Secondary | ICD-10-CM

## 2021-04-21 LAB — CBC WITH DIFFERENTIAL/PLATELET
Abs Immature Granulocytes: 0.28 K/uL — ABNORMAL HIGH (ref 0.00–0.07)
Basophils Absolute: 0 K/uL (ref 0.0–0.1)
Basophils Relative: 0 %
Eosinophils Absolute: 0 K/uL (ref 0.0–0.5)
Eosinophils Relative: 0 %
HCT: 29.7 % — ABNORMAL LOW (ref 36.0–46.0)
Hemoglobin: 9.7 g/dL — ABNORMAL LOW (ref 12.0–15.0)
Immature Granulocytes: 1 %
Lymphocytes Relative: 3 %
Lymphs Abs: 0.8 K/uL (ref 0.7–4.0)
MCH: 28.4 pg (ref 26.0–34.0)
MCHC: 32.7 g/dL (ref 30.0–36.0)
MCV: 86.8 fL (ref 80.0–100.0)
Monocytes Absolute: 1.8 K/uL — ABNORMAL HIGH (ref 0.1–1.0)
Monocytes Relative: 7 %
Neutro Abs: 23.3 K/uL — ABNORMAL HIGH (ref 1.7–7.7)
Neutrophils Relative %: 89 %
Platelets: 223 K/uL (ref 150–400)
RBC: 3.42 MIL/uL — ABNORMAL LOW (ref 3.87–5.11)
RDW: 14 % (ref 11.5–15.5)
WBC: 26.3 K/uL — ABNORMAL HIGH (ref 4.0–10.5)
nRBC: 0 % (ref 0.0–0.2)

## 2021-04-21 LAB — MAGNESIUM: Magnesium: 2.2 mg/dL (ref 1.7–2.4)

## 2021-04-21 LAB — I-STAT CHEM 8, ED
BUN: 36 mg/dL — ABNORMAL HIGH (ref 8–23)
Calcium, Ion: 1.28 mmol/L (ref 1.15–1.40)
Chloride: 103 mmol/L (ref 98–111)
Creatinine, Ser: 1.4 mg/dL — ABNORMAL HIGH (ref 0.44–1.00)
Glucose, Bld: 139 mg/dL — ABNORMAL HIGH (ref 70–99)
HCT: 29 % — ABNORMAL LOW (ref 36.0–46.0)
Hemoglobin: 9.9 g/dL — ABNORMAL LOW (ref 12.0–15.0)
Potassium: 2.5 mmol/L — CL (ref 3.5–5.1)
Sodium: 138 mmol/L (ref 135–145)
TCO2: 23 mmol/L (ref 22–32)

## 2021-04-21 LAB — COMPREHENSIVE METABOLIC PANEL
ALT: 13 U/L (ref 0–44)
AST: 18 U/L (ref 15–41)
Albumin: 2.6 g/dL — ABNORMAL LOW (ref 3.5–5.0)
Alkaline Phosphatase: 100 U/L (ref 38–126)
Anion gap: 8 (ref 5–15)
BUN: 43 mg/dL — ABNORMAL HIGH (ref 8–23)
CO2: 23 mmol/L (ref 22–32)
Calcium: 9.2 mg/dL (ref 8.9–10.3)
Chloride: 103 mmol/L (ref 98–111)
Creatinine, Ser: 1.49 mg/dL — ABNORMAL HIGH (ref 0.44–1.00)
GFR, Estimated: 34 mL/min — ABNORMAL LOW (ref >60)
Glucose, Bld: 151 mg/dL — ABNORMAL HIGH (ref 70–99)
Potassium: 2.6 mmol/L — CL (ref 3.5–5.1)
Sodium: 134 mmol/L — ABNORMAL LOW (ref 135–145)
Total Bilirubin: 0.5 mg/dL (ref 0.3–1.2)
Total Protein: 7.1 g/dL (ref 6.5–8.1)

## 2021-04-21 LAB — LIPASE, BLOOD: Lipase: 32 U/L (ref 11–51)

## 2021-04-21 MED ORDER — ACETAMINOPHEN 325 MG PO TABS: 650.0000 mg | ORAL_TABLET | Freq: Four times a day (QID) | ORAL | Status: DC | PRN

## 2021-04-21 MED ORDER — ONDANSETRON HCL 4 MG/2ML IJ SOLN: 4.0000 mg | Freq: Four times a day (QID) | INTRAMUSCULAR | Status: DC | PRN

## 2021-04-21 MED ORDER — PANTOPRAZOLE SODIUM 40 MG IV SOLR
40.0000 mg | Freq: Every day | INTRAVENOUS | Status: DC
  Administered 2021-04-21: 40 mg via INTRAVENOUS
  Filled 2021-04-21: qty 40

## 2021-04-21 MED ORDER — SODIUM CHLORIDE 0.9 % IV BOLUS
500.0000 mL | Freq: Once | INTRAVENOUS | Status: AC
  Administered 2021-04-21: 22:00:00 500 mL via INTRAVENOUS

## 2021-04-21 MED ORDER — POTASSIUM CHLORIDE 10 MEQ/100ML IV SOLN
10.0000 meq | INTRAVENOUS | Status: AC
  Administered 2021-04-21 (×2): 10 meq via INTRAVENOUS
  Filled 2021-04-21 (×2): qty 100

## 2021-04-21 MED ORDER — ONDANSETRON 4 MG PO TBDP: 4.0000 mg | ORAL_TABLET | Freq: Four times a day (QID) | ORAL | Status: DC | PRN

## 2021-04-21 MED ORDER — ACETAMINOPHEN 650 MG RE SUPP: 650.0000 mg | Freq: Four times a day (QID) | RECTAL | Status: DC | PRN

## 2021-04-21 MED ORDER — PIPERACILLIN-TAZOBACTAM 3.375 G IVPB
3.3750 g | Freq: Three times a day (TID) | INTRAVENOUS | Status: DC
  Administered 2021-04-22 (×2): 3.375 g via INTRAVENOUS
  Filled 2021-04-21 (×3): qty 50

## 2021-04-21 MED ORDER — KCL IN DEXTROSE-NACL 30-5-0.45 MEQ/L-%-% IV SOLN
INTRAVENOUS | Status: DC
  Filled 2021-04-21 (×2): qty 1000

## 2021-04-21 MED ORDER — PIPERACILLIN-TAZOBACTAM 3.375 G IVPB 30 MIN
3.3750 g | Freq: Once | INTRAVENOUS | Status: AC
  Administered 2021-04-21: 22:00:00 3.375 g via INTRAVENOUS
  Filled 2021-04-21: qty 50

## 2021-04-21 MED ORDER — HYDROMORPHONE HCL 1 MG/ML IJ SOLN
0.5000 mg | INTRAMUSCULAR | Status: DC | PRN
  Administered 2021-04-22: 01:00:00 0.5 mg via INTRAVENOUS
  Administered 2021-04-22: 13:00:00 1 mg via INTRAVENOUS
  Administered 2021-04-22 (×2): 0.5 mg via INTRAVENOUS
  Filled 2021-04-21 (×3): qty 1

## 2021-04-21 NOTE — ED Provider Notes (Signed)
[facility] DEPT Provider Note   CSN: [id number] Arrival date & time: 04/21/21  1843     History Chief Complaint  Patient presents with   Abdominal Pain    NEGIN HEGG is a 87 y.o. female.  Patient is a 87 year old female who presents with abdominal pain history is limited as she does have some cognitive decline.  Per chart review, she was recently diagnosed with colon cancer.  Earlier this month she had a mass in her sigmoid colon.  Biopsy was consistent with colon cancer.  She is followed by [facility] gastro[facility].  Over the last 2 days, she has been having worsening pain and decreased appetite.  She does not report any vomiting or fevers although her history is limited.  She currently denies any for any pain medication.      Past Medical History:  Diagnosis Date   Arthritis    Atrial fibrillation ([place])    treated with multaq x 6 mos in 2011....[name]=2 (Age; + CHF; no CVA; no DM2). Echo 05/24/10: EF 30% mild AS(mean 72mmgHg); mild LAE; mod LAE; PASP 35 Systolic CHF   Breast cancer ([place])     in 1987 with a left  mastectomy   Cardiomyopathy    a. tachy mediated Myoview 06/06/10:  EF 31%; no scar or ischemia; EF up to 50% per echo November 2013   Diverticulosis    DJD (degenerative joint disease) of knee    right knee   Falls    GERD (gastroesophageal reflux disease)    Hypercholesteremia    Hypertension    IBS (irritable bowel syndrome)    Lumbar stenosis    Osteoporosis    history of bone chips in the right knee,previous right fibular fracture, operated on by Dr. Veverly Fells   RLS (restless legs syndrome)    Vitamin D deficiency     Patient Active Problem List   Diagnosis Date Noted   Cancer of sigmoid colon ([place]) 04/16/2021   Hydroureter on right 04/16/2021   Leukocytosis 06/15/2018   Osteoporosis 03/02/2017   Urinary incontinence 10/14/2015   Routine general medical examination at a health care facility 12/06/2014   HX: breast  cancer 06/29/2014   Hypertension    Vitamin D deficiency    Spinal stenosis, lumbar region, with neurogenic claudication 03/28/2013    Class: Chronic   At high risk for falls 44/81/8563   Systolic heart failure ([place]) 06/07/2010   Congestive dilated cardiomyopathy ([place]) 05/29/2010   Atrial fibrillation ([place]) 04/16/2009   Elevated lipids 04/13/2009   Osteoarthritis 04/13/2009    Past Surgical History:  Procedure Laterality Date   BREAST RECONSTRUCTION  1988   left breast   CARDIOVERSION  2012   KNEE ARTHROSCOPY  2005   MASTECTOMY, RADICAL  1987   left breast   WRIST FRACTURE SURGERY     left wrist     OB History   No obstetric history on file.     Family History  Problem Relation Age of Onset   Aortic aneurysm Mother    Heart attack Father    Breast cancer Sister    Diabetes Sister    Stomach cancer Sister    Colon cancer Neg Hx    Rectal cancer Neg Hx    Esophageal cancer Neg Hx     Social History   Tobacco Use   Smoking status: Never   Smokeless tobacco: Never  Vaping Use   Vaping Use: Never used  Substance Use Topics   Alcohol  use: No   Drug use: No    Home Medications Prior to Admission medications   Medication Sig Start Date End Date Taking? Authorizing Provider  acetaminophen (TYLENOL) 500 MG tablet Take 500-1,000 mg by mouth every 6 (six) hours as needed for headache (pain).   Yes [provider]  carvedilol (COREG) 6.25 MG tablet TAKE 2 TABLETS BY MOUTH EVERY MORNING AND 1 TABLET EVERY EVENING Patient taking differently: Take 6.25-12.5 mg by mouth 2 (two) times daily. Take 2 tablets (12.5 mg) by mouth every morning and 1 tablet (6.25 mg) at bedtime 04/04/20  Yes Josue Hector, MD  dicyclomine (BENTYL) 10 MG capsule Take 1 capsule (10 mg total) by mouth 3 (three) times daily before meals. 04/16/21  Yes Hoyt Koch, MD  Menthol, Topical Analgesic, (ICY HOT EX) Apply 1 application topically daily as needed (back pain).   Yes  [provider]  Multiple Vitamin (MULTIVITAMIN WITH MINERALS) TABS tablet Take 1 tablet by mouth every morning. Centrum   Yes [provider]  omeprazole (PRILOSEC OTC) 20 MG tablet Take 20 mg by mouth 2 (two) times daily.   Yes [provider]  rivaroxaban (XARELTO) 20 MG TABS tablet TAKE 1 TABLET (20 MG TOTAL) BY MOUTH DAILY WITH SUPPER. Patient taking differently: 20 mg at bedtime. 01/21/21  Yes Josue Hector, MD  traMADol (ULTRAM) 50 MG tablet Take 1 tablet (50 mg total) by mouth every 8 (eight) hours as needed for up to 5 days. Patient taking differently: Take 50 mg by mouth every 8 (eight) hours as needed (pain). 04/16/21 04/21/21 Yes Hoyt Koch, MD  omeprazole (PRILOSEC) 20 MG capsule Take 1 capsule (20 mg total) by mouth 2 (two) times daily before a meal for 14 days. Patient not taking: Reported on 04/21/2021 09/23/20 04/12/21  Kara Dies, NP    Allergies    Amiodarone, Lipitor [atorvastatin], and Multaq [dronedarone]  Review of Systems   Review of Systems  Constitutional:  Positive for appetite change. Negative for chills, diaphoresis, fatigue and fever.  HENT:  Negative for congestion, rhinorrhea and sneezing.   Eyes: Negative.   Respiratory:  Negative for cough, chest tightness and shortness of breath.   Cardiovascular:  Negative for chest pain and leg swelling.  Gastrointestinal:  Positive for abdominal pain. Negative for blood in stool, diarrhea, nausea and vomiting.  Genitourinary:  Negative for difficulty urinating, flank pain, frequency and hematuria.  Musculoskeletal:  Negative for arthralgias and back pain.  Skin:  Negative for rash.  Neurological:  Negative for dizziness, speech difficulty, weakness, numbness and headaches.   Physical Exam Updated Vital Signs BP 113/73   Pulse 99   Temp 98.8 F (37.1 C)   Resp 19   Ht 5\' 4"  (1.626 m)   Wt 69.9 kg   SpO2 96%   BMI 26.43 kg/m   Physical Exam Constitutional:       Appearance: She is well-developed.  HENT:     Head: Normocephalic and atraumatic.  Eyes:     Pupils: Pupils are equal, round, and reactive to light.  Cardiovascular:     Rate and Rhythm: Normal rate and regular rhythm.     Heart sounds: Normal heart sounds.  Pulmonary:     Effort: Pulmonary effort is normal. No respiratory distress.     Breath sounds: Normal breath sounds. No wheezing or rales.  Chest:     Chest wall: No tenderness.  Abdominal:     General: Bowel sounds are normal.  There is distension.     Palpations: Abdomen is soft.     Tenderness: There is generalized abdominal tenderness. There is no guarding or rebound.  Musculoskeletal:        General: Normal range of motion.     Cervical back: Normal range of motion and neck supple.  Lymphadenopathy:     Cervical: No cervical adenopathy.  Skin:    General: Skin is warm and dry.     Findings: No rash.  Neurological:     Mental Status: She is alert and oriented to person, place, and time.    ED Results / Procedures / Treatments   Labs (all labs ordered are listed, but only abnormal results are displayed) Labs Reviewed  COMPREHENSIVE METABOLIC PANEL - Abnormal; Notable for the following components:      Result Value   Sodium 134 (*)    Potassium 2.6 (*)    Glucose, Bld 151 (*)    BUN 43 (*)    Creatinine, Ser 1.49 (*)    Albumin 2.6 (*)    GFR, Estimated 34 (*)    All other components within normal limits  CBC WITH DIFFERENTIAL/PLATELET - Abnormal; Notable for the following components:   WBC 26.3 (*)    RBC 3.42 (*)    Hemoglobin 9.7 (*)    HCT 29.7 (*)    Neutro Abs 23.3 (*)    Monocytes Absolute 1.8 (*)    Abs Immature Granulocytes 0.28 (*)    All other components within normal limits  I-STAT CHEM 8, ED - Abnormal; Notable for the following components:   Potassium 2.5 (*)    BUN 36 (*)    Creatinine, Ser 1.40 (*)    Glucose, Bld 139 (*)    Hemoglobin 9.9 (*)    HCT 29.0 (*)    All other components  within normal limits  RESP PANEL BY RT-PCR (FLU A&B, COVID) ARPGX2  LIPASE, BLOOD  URINALYSIS, ROUTINE W REFLEX MICROSCOPIC  MAGNESIUM    EKG None  Radiology CT Abdomen Pelvis Wo Contrast  Result Date: 04/21/2021 CLINICAL DATA:  Abdominal distension. Recent diagnosis of colon cancer. EXAM: CT ABDOMEN AND PELVIS WITHOUT CONTRAST TECHNIQUE: Multidetector CT imaging of the abdomen and pelvis was performed following the standard protocol without IV contrast. COMPARISON:  CT abdomen and pelvis 03/19/2021. FINDINGS: Lower chest: There is a stable 3 mm nodular density in the left lower lobe. Hepatobiliary: No focal liver abnormality is seen. No gallstones, gallbladder wall thickening, or biliary dilatation. Pancreas: Unremarkable. No pancreatic ductal dilatation or surrounding inflammatory changes. Spleen: Normal in size without focal abnormality. Adrenals/Urinary Tract: Right renal cyst is again noted. There are subcentimeter hyperdense areas in the mid and lower right kidney which may represent complex cysts. There is a punctate nonobstructing calculus in the superior pole the right kidney. There is moderate right-sided hydroureteronephrosis without obstructing calculus identified to the level of the pelvis, unchanged. The bladder, left kidney and adrenal glands are within normal limits. Stomach/Bowel: Sigmoid colon wall thickening is again seen compatible with patient's known carcinoma. There is resultant colonic obstruction. Upstream colon is dilated with air-fluid levels diffusely. There is a small amount of free air in the pelvis image 2/75 compatible with bowel perforation. Small bowel and stomach are nondilated. There is a small hiatal hernia. There is wall thickening of ileal loops in the right lower quadrant with surrounding inflammation worrisome for enteritis. Additionally, there are several air-fluid collections in the pelvis bilaterally and cul-de-sac worrisome for abscess formation.  These are  difficult to evaluate with lack of oral and intravenous contrast. The largest collection is in the pelvic cul-de-sac extending into the lower pelvis measuring 6.9 x 5.0 x 8.6 cm. There is likely a collection in the left pelvis which may or may not be connected to the cul-de-sac collection image 2/75 measuring 4.2 x 1.9 x 2.5 cm. There is also collection in the right lower quadrant measuring 2.8 x 3.5 by 3.8 cm image 2/74. Vascular/Lymphatic: Aortic atherosclerosis. No enlarged abdominal or pelvic lymph nodes. Reproductive: Calcified uterine fibroids are present. Ovaries are not well delineated. Other: There is a small amount of free fluid in the right lower quadrant. Please see description of stomach and bowel. There are new likely metastatic deposits along the omentum in the upper abdomen image 2/47. The largest measures 11 x 20 mm. There is no abdominal wall hernia. Musculoskeletal: Degenerative changes affect the spine. Left breast implant present. IMPRESSION: 1. Sigmoid colon mass again noted. There is resultant colonic obstruction. There is a tiny amount of free air in the pelvis compatible with bowel perforation. 2. Interval development of several air-fluid collections throughout the pelvis and lower abdomen worrisome for abscesses. The largest is in the pelvic cul-de-sac measuring 6.9 x 5.0 x 8.6 cm. 3. Wall thickening and inflammation of ileal loops in the right lower quadrant worrisome for enteritis. 4. Stable moderate right-sided hydroureteronephrosis to the level the pelvis. No obstructing calculus identified. 5. New peritoneal implants along the omentum concerning for metastatic disease. Electronically Signed   By: Ronney Asters M.D.   On: 04/21/2021 20:56    Procedures Procedures   Medications Ordered in ED Medications  potassium chloride 10 mEq in 100 mL IVPB (10 mEq Intravenous New Bag/Given 04/21/21 2118)  sodium chloride 0.9 % bolus 500 mL (has no administration in time range)   piperacillin-tazobactam (ZOSYN) IVPB 3.375 g (has no administration in time range)    ED Course  I have reviewed the triage vital signs and the nursing notes.  Pertinent labs & imaging results that were available during my care of the patient were reviewed by me and considered in my medical decision making (see chart for details).    MDM Rules/Calculators/A&P                           Patient is a 87 year old female who presents with worsening abdominal pain after recent diagnosis of a colon mass.  CT scan shows evidence of a colonic obstruction resulting from the mass associated with a possible bowel perforation/free air.  Her labs show an elevated WBC count.  Her potassium is markedly low and she was started on potassium replacement.  Magnesium is pending.  She was started on IV Zosyn.  She was given IV fluids.  I spoke with Dr. Harlow Asa he will admit the patient for further treatment.  CRITICAL CARE Performed by: Malvin Johns Total critical care time: 60 minutes Critical care time was exclusive of separately billable procedures and treating other patients. Critical care was necessary to treat or prevent imminent or life-threatening deterioration. Critical care was time spent personally by me on the following activities: development of treatment plan with patient and/or surrogate as well as nursing, discussions with consultants, evaluation of patient's response to treatment, examination of patient, obtaining history from patient or surrogate, ordering and performing treatments and interventions, ordering and review of laboratory studies, ordering and review of radiographic studies, pulse oximetry and re-evaluation of patient's condition.  Final Clinical Impression(s) / ED Diagnoses Final diagnoses:  Colonic obstruction ([place])  Bowel perforation ([place])  Hypokalemia    Rx / DC Orders ED Discharge Orders     None        Malvin Johns, MD 04/21/21 2127

## 2021-04-21 NOTE — ED Triage Notes (Signed)
[facility] brings pt in from home for abdominal pain that started 2 days ago. Pt recently diagnosed with colon cancer.

## 2021-04-21 NOTE — Telephone Encounter (Signed)
I spoke with her daughter. She's done poorly for the past several days, really not eating or drinking and she is moaning a lot like she is in pain.  I recommended she take her mom to the ER tonight.

## 2021-04-21 NOTE — H&P (Signed)
Pamela Whitaker is an 87 y.o. female.        Chief Complaint:  Adenocarcinoma of sigmoid colon with metastasis to omentum, contained perforation with pelvic abscess  HPI: Patient is an 87 year old female recently diagnosed with adenocarcinoma of the sigmoid colon on colonoscopy.  Patient has a 1 year history of intermittent lower abdominal pain.  This is associated with constipation.  Patient has had episodes of bleeding per rectum.  Symptoms became more significant over the past 2 to 3 months.  A recent CT scan indicated the presence of a mass in the sigmoid colon.  On April 12, 2021 the patient underwent colonoscopy with biopsy showing adenocarcinoma in the sigmoid colon.  CEA level is elevated at 72.  Patient was referred to surgery for evaluation.  However, over the past 10 days, the patient has complained of increasing abdominal pain and was brought to the emergency room today by her daughter.  White blood cell count is elevated at 26,000.  CT scan of the abdomen and pelvis demonstrates the tumor in the sigmoid colon with probable contained perforation.  There appears to be a large abscess in the pelvis.  There also appeared to be omental metastases.  General surgery is called for management.  Patient is also noted to have hydroureter and has been referred to [facility] for stent placement.  Patient has not been seen by medical oncology.  She has been followed by medical oncology at [facility] for her history of breast cancer.  Primary care physician is Dr. Pricilla Holm.  Past Medical History:  Diagnosis Date   Arthritis    Atrial fibrillation ([place])    treated with multaq x 6 mos in 2011....[name]=2 (Age; + CHF; no CVA; no DM2). Echo 05/24/10: EF 30% mild AS(mean 41mmgHg); mild LAE; mod LAE; PASP 35 Systolic CHF   Breast cancer ([place])     in 1987 with a left  mastectomy   Cardiomyopathy    a. tachy mediated Myoview 06/06/10:  EF 31%; no scar or ischemia; EF up to 50%  per echo November 2013   Diverticulosis    DJD (degenerative joint disease) of knee    right knee   Falls    GERD (gastroesophageal reflux disease)    Hypercholesteremia    Hypertension    IBS (irritable bowel syndrome)    Lumbar stenosis    Osteoporosis    history of bone chips in the right knee,previous right fibular fracture, operated on by Dr. Veverly Fells   RLS (restless legs syndrome)    Vitamin D deficiency     Past Surgical History:  Procedure Laterality Date   BREAST RECONSTRUCTION  1988   left breast   CARDIOVERSION  2012   KNEE ARTHROSCOPY  2005   MASTECTOMY, RADICAL  1987   left breast   WRIST FRACTURE SURGERY     left wrist    Family History  Problem Relation Age of Onset   Aortic aneurysm Mother    Heart attack Father    Breast cancer Sister    Diabetes Sister    Stomach cancer Sister    Colon cancer Neg Hx    Rectal cancer Neg Hx    Esophageal cancer Neg Hx    Social History:  reports that she has never smoked. She has never used smokeless tobacco. She reports that she does not drink alcohol and does not use drugs.  Allergies:  Allergies  Allergen Reactions   Amiodarone Other (See Comments)  Unknown reaction (Stopped on her own)   Lipitor [Atorvastatin] Other (See Comments)    weakness   Multaq [Dronedarone] Other (See Comments)    Unknown reaction (Stopped on her own)    (Not in a hospital admission)   Results for orders placed or performed during the hospital encounter of 04/21/21 (from the past 48 hour(s))  Comprehensive metabolic panel     Status: Abnormal   Collection Time: 04/21/21  7:29 PM  Result Value Ref Range   Sodium 134 (L) 135 - 145 mmol/L   Potassium 2.6 (LL) 3.5 - 5.1 mmol/L    Comment: CRITICAL RESULT CALLED TO, READ BACK BY AND VERIFIED WITH:  GRAY S @2029  BY BATTLET    Chloride 103 98 - 111 mmol/L   CO2 23 22 - 32 mmol/L   Glucose, Bld 151 (H) 70 - 99 mg/dL    Comment: Glucose reference range applies only to samples  taken after fasting for at least 8 hours.   BUN 43 (H) 8 - 23 mg/dL   Creatinine, Ser 1.49 (H) 0.44 - 1.00 mg/dL   Calcium 9.2 8.9 - 10.3 mg/dL   Total Protein 7.1 6.5 - 8.1 g/dL   Albumin 2.6 (L) 3.5 - 5.0 g/dL   AST 18 15 - 41 U/L   ALT 13 0 - 44 U/L   Alkaline Phosphatase 100 38 - 126 U/L   Total Bilirubin 0.5 0.3 - 1.2 mg/dL   GFR, Estimated 34 (L) >60 mL/min    Comment: (NOTE) Calculated using the CKD-EPI Creatinine Equation (2021)    Anion gap 8 5 - 15    Comment: Performed at [facility], [place] [address], [place], [place] [address]  Lipase, blood     Status: None   Collection Time: 04/21/21  7:29 PM  Result Value Ref Range   Lipase 32 11 - 51 U/L    Comment: Performed at [facility], [place] [address], [place], [place] [address]  CBC with Differential     Status: Abnormal   Collection Time: 04/21/21  7:29 PM  Result Value Ref Range   WBC 26.3 (H) 4.0 - 10.5 K/uL   RBC 3.42 (L) 3.87 - 5.11 MIL/uL   Hemoglobin 9.7 (L) 12.0 - 15.0 g/dL   HCT 29.7 (L) 36.0 - 46.0 %   MCV 86.8 80.0 - 100.0 fL   MCH 28.4 26.0 - 34.0 pg   MCHC 32.7 30.0 - 36.0 g/dL   RDW 14.0 11.5 - 15.5 %   Platelets 223 150 - 400 K/uL   nRBC 0.0 0.0 - 0.2 %   Neutrophils Relative % 89 %   Neutro Abs 23.3 (H) 1.7 - 7.7 K/uL   Lymphocytes Relative 3 %   Lymphs Abs 0.8 0.7 - 4.0 K/uL   Monocytes Relative 7 %   Monocytes Absolute 1.8 (H) 0.1 - 1.0 K/uL   Eosinophils Relative 0 %   Eosinophils Absolute 0.0 0.0 - 0.5 K/uL   Basophils Relative 0 %   Basophils Absolute 0.0 0.0 - 0.1 K/uL   Immature Granulocytes 1 %   Abs Immature Granulocytes 0.28 (H) 0.00 - 0.07 K/uL    Comment: Performed at [facility], [place] [address], [place], [place] [address]  I-stat chem 8, ED     Status: Abnormal   Collection Time: 04/21/21  7:58 PM  Result Value Ref Range   Sodium 138 135 - 145 mmol/L   Potassium 2.5 (LL) 3.5 - 5.1 mmol/L  Chloride 103 98 - 111 mmol/L    BUN 36 (H) 8 - 23 mg/dL   Creatinine, Ser 1.40 (H) 0.44 - 1.00 mg/dL   Glucose, Bld 139 (H) 70 - 99 mg/dL    Comment: Glucose reference range applies only to samples taken after fasting for at least 8 hours.   Calcium, Ion 1.28 1.15 - 1.40 mmol/L   TCO2 23 22 - 32 mmol/L   Hemoglobin 9.9 (L) 12.0 - 15.0 g/dL   HCT 29.0 (L) 36.0 - 46.0 %   Comment NOTIFIED PHYSICIAN   Magnesium     Status: None   Collection Time: 04/21/21  9:27 PM  Result Value Ref Range   Magnesium 2.2 1.7 - 2.4 mg/dL    Comment: Performed at [facility], [place] [address] [place], [place] [address]   CT Abdomen Pelvis Wo Contrast  Result Date: 04/21/2021 CLINICAL DATA:  Abdominal distension. Recent diagnosis of colon cancer. EXAM: CT ABDOMEN AND PELVIS WITHOUT CONTRAST TECHNIQUE: Multidetector CT imaging of the abdomen and pelvis was performed following the standard protocol without IV contrast. COMPARISON:  CT abdomen and pelvis 03/19/2021. FINDINGS: Lower chest: There is a stable 3 mm nodular density in the left lower lobe. Hepatobiliary: No focal liver abnormality is seen. No gallstones, gallbladder wall thickening, or biliary dilatation. Pancreas: Unremarkable. No pancreatic ductal dilatation or surrounding inflammatory changes. Spleen: Normal in size without focal abnormality. Adrenals/Urinary Tract: Right renal cyst is again noted. There are subcentimeter hyperdense areas in the mid and lower right kidney which may represent complex cysts. There is a punctate nonobstructing calculus in the superior pole the right kidney. There is moderate right-sided hydroureteronephrosis without obstructing calculus identified to the level of the pelvis, unchanged. The bladder, left kidney and adrenal glands are within normal limits. Stomach/Bowel: Sigmoid colon wall thickening is again seen compatible with patient's known carcinoma. There is resultant colonic obstruction. Upstream colon is dilated with air-fluid levels  diffusely. There is a small amount of free air in the pelvis image 2/75 compatible with bowel perforation. Small bowel and stomach are nondilated. There is a small hiatal hernia. There is wall thickening of ileal loops in the right lower quadrant with surrounding inflammation worrisome for enteritis. Additionally, there are several air-fluid collections in the pelvis bilaterally and cul-de-sac worrisome for abscess formation. These are difficult to evaluate with lack of oral and intravenous contrast. The largest collection is in the pelvic cul-de-sac extending into the lower pelvis measuring 6.9 x 5.0 x 8.6 cm. There is likely a collection in the left pelvis which may or may not be connected to the cul-de-sac collection image 2/75 measuring 4.2 x 1.9 x 2.5 cm. There is also collection in the right lower quadrant measuring 2.8 x 3.5 by 3.8 cm image 2/74. Vascular/Lymphatic: Aortic atherosclerosis. No enlarged abdominal or pelvic lymph nodes. Reproductive: Calcified uterine fibroids are present. Ovaries are not well delineated. Other: There is a small amount of free fluid in the right lower quadrant. Please see description of stomach and bowel. There are new likely metastatic deposits along the omentum in the upper abdomen image 2/47. The largest measures 11 x 20 mm. There is no abdominal wall hernia. Musculoskeletal: Degenerative changes affect the spine. Left breast implant present. IMPRESSION: 1. Sigmoid colon mass again noted. There is resultant colonic obstruction. There is a tiny amount of free air in the pelvis compatible with bowel perforation. 2. Interval development of several air-fluid collections throughout the pelvis and lower abdomen worrisome for abscesses.  The largest is in the pelvic cul-de-sac measuring 6.9 x 5.0 x 8.6 cm. 3. Wall thickening and inflammation of ileal loops in the right lower quadrant worrisome for enteritis. 4. Stable moderate right-sided hydroureteronephrosis to the level the  pelvis. No obstructing calculus identified. 5. New peritoneal implants along the omentum concerning for metastatic disease. Electronically Signed   By: Ronney Asters M.D.   On: 04/21/2021 20:56    Review of Systems  Constitutional:  Positive for appetite change.  HENT: Negative.    Eyes: Negative.   Respiratory: Negative.    Cardiovascular: Negative.   Gastrointestinal:  Positive for abdominal distention, abdominal pain and blood in stool.  Endocrine: Negative.   Genitourinary:  Positive for pelvic pain.  Musculoskeletal: Negative.   Skin: Negative.   Allergic/Immunologic: Negative.   Neurological: Negative.   Hematological:  Bruises/bleeds easily.  Psychiatric/Behavioral:  Positive for confusion and decreased concentration.     Physical Exam   Blood pressure 120/79, pulse 95, temperature 98.8 F (37.1 C), resp. rate 19, height 5\' 4"  (1.626 m), weight 69.9 kg, SpO2 97 %.  CONSTITUTIONAL: no acute distress; conversant; no obvious deformities  EYES: conjunctiva moist; no lid lag; anicteric; pupils equal bilaterally  NECK: trachea midline; no thyroid nodularity  LUNGS: respiratory effort normal & unlabored; no wheeze; no rales  CV: rate and rhythm regular; no murmur; no edema bilat lower extremities  GI: abdomen is moderately distended, relatively firm, with tenderness to palpation in bilateral lower quadrants.  No obvious hernia.  High-pitched bowel sounds noted on auscultation  MSK: normal range of motion of extremities; no clubbing; no cyanosis  PSYCH: appropriate affect for situation; daughter at bedside provides history  LYMPHATIC: no palpable cervical lymphadenopathy; no evidence lymphedema in extremities    Assessment/Plan  Adenocarcinoma of sigmoid colon Metastatic carcinoma to omentum Elevated CEA level Perforation of tumor with pelvic abscess  Admit to surgical service  IV Zosyn started in ER  NPO, sips & chips for comfort - hold off on NG unless  emesis starts  Hold anticoagulation  IV hydration  Will consult IR in AM 11/28 for possible drainage procedure  Will consult medical oncology in AM 11/28 for evaluation and recommendations  Consider palliative care consultation early in hospital course  Will notify urology of patient's admission on 11/28 Atrial fibrillation on anticoagulation (Xarelto - last dose 11/26 PM) History of breast cancer Congestive heart failure secondary to cardiomyopathy HTN  Patient is pleasant although mildly confused, possibly with early cognitive decline.  I spoke with the daughter and the patient at the bedside.  I explained that the biopsies had shown a malignancy in the sigmoid colon.  This has likely been perforated for several days and is resulted in a large abscess in the pelvis.  There is also evidence on CT scan of spread of the cancer to the omentum.  CEA level is elevated, also consistent with metastasis.  We discussed admission to the surgical service.  She will be evaluated tomorrow by one of our colorectal surgeons, Dr. Annye English.  We will also ask interventional radiology to evaluate for possible drainage procedure.  Patient will need to be evaluated by medical oncology and possibly by palliative care services.  Patient has previously been seen by urology for hydroureter.  We will notify them of her admission tomorrow.  Patient and her daughter understand and agree with this plan.  I have discussed this case with Dr. Tamera Punt as well.  Armandina Gemma, MD [facility] [place]  practice Office: [place], MD 04/21/2021, 10:18 PM

## 2021-04-21 NOTE — Progress Notes (Signed)
Pharmacy Antibiotic Note  Pamela Whitaker is a 87 y.o. female admitted on 04/21/2021 with intra-abdominal infection.  PMH significant for recently diagnosed colon cancer, h/o breast cancer.  CT shows large abscess in pelvis.  Pharmacy has been consulted for Zosyn dosing.  Plan: Zosyn 3.375g IV q8h (4 hour infusion). Follow SCr (SCr ~ 0.73 Oct 2022)  Height: 5\' 4"  (162.6 cm) Weight: 69.9 kg (154 lb) IBW/kg (Calculated) : 54.7  Temp (24hrs), Avg:98.8 F (37.1 C), Min:98.8 F (37.1 C), Max:98.8 F (37.1 C)  Recent Labs  Lab 04/16/21 1421 04/21/21 1929 04/21/21 1958  WBC  --  26.3*  --   CREATININE 1.93* 1.49* 1.40*    Estimated Creatinine Clearance: 27.7 mL/min (A) (by C-G formula based on SCr of 1.4 mg/dL (H)).    Allergies  Allergen Reactions   Amiodarone Other (See Comments)    Unknown reaction (Stopped on her own)   Lipitor [Atorvastatin] Other (See Comments)    weakness   Multaq [Dronedarone] Other (See Comments)    Unknown reaction (Stopped on her own)    Antimicrobials this admission: 11/27 Zosyn >>     Thank you for allowing pharmacy to be a part of this patient's care.  Everette Rank, PharmD 04/21/2021 11:19 PM

## 2021-04-22 DIAGNOSIS — C189 Malignant neoplasm of colon, unspecified: Secondary | ICD-10-CM

## 2021-04-22 LAB — CBC
HCT: 29.1 % — ABNORMAL LOW (ref 36.0–46.0)
Hemoglobin: 9.4 g/dL — ABNORMAL LOW (ref 12.0–15.0)
MCH: 28.7 pg (ref 26.0–34.0)
MCHC: 32.3 g/dL (ref 30.0–36.0)
MCV: 88.7 fL (ref 80.0–100.0)
Platelets: 297 10*3/uL (ref 150–400)
RBC: 3.28 MIL/uL — ABNORMAL LOW (ref 3.87–5.11)
RDW: 14.2 % (ref 11.5–15.5)
WBC: 24.4 10*3/uL — ABNORMAL HIGH (ref 4.0–10.5)
nRBC: 0 % (ref 0.0–0.2)

## 2021-04-22 LAB — BASIC METABOLIC PANEL
Anion gap: 7 (ref 5–15)
BUN: 41 mg/dL — ABNORMAL HIGH (ref 8–23)
CO2: 22 mmol/L (ref 22–32)
Calcium: 8.9 mg/dL (ref 8.9–10.3)
Chloride: 106 mmol/L (ref 98–111)
Creatinine, Ser: 1.57 mg/dL — ABNORMAL HIGH (ref 0.44–1.00)
GFR, Estimated: 32 mL/min — ABNORMAL LOW (ref >60)
Glucose, Bld: 150 mg/dL — ABNORMAL HIGH (ref 70–99)
Potassium: 2.8 mmol/L — ABNORMAL LOW (ref 3.5–5.1)
Sodium: 135 mmol/L (ref 135–145)

## 2021-04-22 LAB — RESP PANEL BY RT-PCR (FLU A&B, COVID) ARPGX2
Influenza A by PCR: NEGATIVE
Influenza B by PCR: NEGATIVE
SARS Coronavirus 2 by RT PCR: NEGATIVE

## 2021-04-22 LAB — SURGICAL PCR SCREEN
MRSA, PCR: NEGATIVE
Staphylococcus aureus: NEGATIVE

## 2021-04-22 MED ORDER — POTASSIUM CHLORIDE 10 MEQ/100ML IV SOLN
10.0000 meq | INTRAVENOUS | Status: AC
  Administered 2021-04-22 (×5): 10 meq via INTRAVENOUS
  Filled 2021-04-22 (×2): qty 100

## 2021-04-22 MED ORDER — BIOTENE DRY MOUTH MT LIQD: 15.0000 mL | OROMUCOSAL | Status: DC | PRN

## 2021-04-22 MED ORDER — ACETAMINOPHEN 650 MG RE SUPP: 650.0000 mg | Freq: Four times a day (QID) | RECTAL | Status: DC | PRN

## 2021-04-22 MED ORDER — POLYVINYL ALCOHOL 1.4 % OP SOLN: 1.0000 [drp] | Freq: Four times a day (QID) | OPHTHALMIC | Status: DC | PRN

## 2021-04-22 MED ORDER — GLYCOPYRROLATE 0.2 MG/ML IJ SOLN: 0.2000 mg | INTRAMUSCULAR | Status: DC | PRN

## 2021-04-22 MED ORDER — HYDROMORPHONE HCL 1 MG/ML IJ SOLN
1.0000 mg | INTRAMUSCULAR | Status: DC | PRN
  Administered 2021-04-22 – 2021-04-23 (×4): 1 mg via INTRAVENOUS
  Filled 2021-04-22 (×5): qty 1

## 2021-04-22 MED ORDER — HALOPERIDOL LACTATE 2 MG/ML PO CONC
0.5000 mg | ORAL | Status: DC | PRN
  Filled 2021-04-22: qty 0.3

## 2021-04-22 MED ORDER — LORAZEPAM 2 MG/ML IJ SOLN
1.0000 mg | INTRAMUSCULAR | Status: DC | PRN
  Administered 2021-04-22: 17:00:00 1 mg via INTRAVENOUS
  Filled 2021-04-22: qty 1

## 2021-04-22 MED ORDER — ONDANSETRON HCL 4 MG/2ML IJ SOLN: 4.0000 mg | Freq: Four times a day (QID) | INTRAMUSCULAR | Status: DC | PRN

## 2021-04-22 MED ORDER — LIP MEDEX EX OINT
TOPICAL_OINTMENT | CUTANEOUS | Status: AC
  Filled 2021-04-22: qty 7

## 2021-04-22 MED ORDER — SODIUM CHLORIDE 0.9 % IV SOLN: INTRAVENOUS | Status: DC

## 2021-04-22 MED ORDER — HALOPERIDOL LACTATE 5 MG/ML IJ SOLN: 0.5000 mg | INTRAMUSCULAR | Status: DC | PRN

## 2021-04-22 NOTE — Progress Notes (Signed)
Palliative Care Consult Note  Pamela Whitaker is an 87 yo woman with newly diagnosed stage IV adenocarcinoma of the colon with omental disease, perforation with abscess formation and ureteral obstruction from mass effect. She was admitted top the surgical service for management. Palliative care was consulted given her advanced age, declining functional status, severity of disease and risk for surgical complications to determine goals of care.   At this time the patient does not appear to be in any distress but she is clearly very frail and confused. Her daughter just left the hospital to go home and rest after being with her mother all night.  Recommendations:  I strongly support a comfort care approach in this patient given the severity of her disease. It is my opinion that putting her through surgery or other interventions would with a high degree of certainly lead to worsening functional status, complicated delirium, more pain and suffering and complications leading to death. She is already dying from the impact of this cancer- the compassionate and humane thing to do is to allow for a natural death to occur with the support of hospice. I will share my recommendations with her family and support them in determining their goals of care.  Fortunately she does not appear to be suffering at this time and does not complain of pain or show non-verbal signs of pain. She complains of a dry mouth.  Before proceeding with any aggressive interventions or procedures we need to determine realistic goals of care and possible outcomes with her family-I will allow for her daughter to rest and call her this afternoon.   Lane Hacker, DO Palliative Medicine   Time: 30 minutes Greater than 50%  of this time was spent counseling and coordinating care related to the above assessment and plan.

## 2021-04-22 NOTE — Plan of Care (Signed)
  Problem: Education: Goal: Knowledge of General Education information will improve Description: Including pain rating scale, medication(s)/side effects and non-pharmacologic comfort measures Outcome: Progressing   Problem: Pain Managment: Goal: General experience of comfort will improve Outcome: Progressing   Problem: Safety: Goal: Ability to remain free from injury will improve Outcome: Progressing   

## 2021-04-22 NOTE — Progress Notes (Signed)
Palliative Care Family Meeting  Met with patient's daughter Jenny Reichmann and her other daughter by phone. Goals are comfort and to allow for a natural death to occur without suffering. I have recommended a hospice facility for end of life care -at family request, referral called to [facility]. We will continue to manage her EOL care until a bed is available. She is more agitated this afternoon-may need to start a continuous infusion- will follow up with RN on needs.  Lane Hacker, DO Palliative Medicine'

## 2021-04-22 NOTE — Telephone Encounter (Signed)
Sorry to hear this, thanks for letting me know 

## 2021-04-22 NOTE — Progress Notes (Signed)
Subjective No acute events. Slept comfortably at night. No nausea/vomiting. Her daughter is at bedside this morning.  Objective: Vital signs in last 24 hours: Temp:  [97.5 F (36.4 C)-98.8 F (37.1 C)] 97.5 F (36.4 C) (11/28 0413) Pulse Rate:  [95-103] 99 (11/28 0425) Resp:  [15-19] 18 (11/28 0413) BP: (93-120)/(59-79) 104/70 (11/28 0425) SpO2:  [93 %-98 %] 96 % (11/28 0425) Weight:  [69.9 kg] 69.9 kg (11/27 1853) Last BM Date: 04/21/21  Intake/Output from previous day: 11/27 0701 - 11/28 0700 In: 995.7 [I.V.:445.7; IV Piggyback:550] Out: -  Intake/Output this shift: No intake/output data recorded.  Gen: NAD, comfortable Pulm: Normal work of breathing Abd: Soft, moderately distended, not significantly tender. No rebound nor guarding Ext: SCDs in place  Lab Results: CBC  Recent Labs    04/21/21 1929 04/21/21 1958 04/22/21 0747  WBC 26.3*  --  24.4*  HGB 9.7* 9.9* 9.4*  HCT 29.7* 29.0* 29.1*  PLT 223  --  297   BMET Recent Labs    04/21/21 1929 04/21/21 1958 04/22/21 0348  NA 134* 138 135  K 2.6* 2.5* 2.8*  CL 103 103 106  CO2 23  --  22  GLUCOSE 151* 139* 150*  BUN 43* 36* 41*  CREATININE 1.49* 1.40* 1.57*  CALCIUM 9.2  --  8.9   PT/INR No results for input(s): LABPROT, INR in the last 72 hours. ABG No results for input(s): PHART, HCO3 in the last 72 hours.  Invalid input(s): PCO2, PO2  Studies/Results:  Anti-infectives: Anti-infectives (From admission, onward)    Start     Dose/Rate Route Frequency Ordered Stop   04/22/21 0600  piperacillin-tazobactam (ZOSYN) IVPB 3.375 g        3.375 g 12.5 mL/hr over 240 Minutes Intravenous Every 8 hours 04/21/21 2318     04/21/21 2130  piperacillin-tazobactam (ZOSYN) IVPB 3.375 g        3.375 g 100 mL/hr over 30 Minutes Intravenous  Once 04/21/21 2126 04/21/21 2257        Assessment/Plan: Patient Active Problem List   Diagnosis Date Noted   Colon carcinoma metastatic to multiple sites ([place])  04/21/2021   Pelvic abscess in female 04/21/2021   Elevated carcinoembryonic antigen (CEA) 04/21/2021   Adenocarcinoma of colon ([place]) 04/21/2021   Adenocarcinoma of sigmoid colon ([place]) 04/16/2021   Hydroureter on right 04/16/2021   Leukocytosis 06/15/2018   Osteoporosis 03/02/2017   Urinary incontinence 10/14/2015   Routine general medical examination at a health care facility 12/06/2014   HX: breast cancer 06/29/2014   Hypertension    Vitamin D deficiency    Spinal stenosis, lumbar region, with neurogenic claudication 03/28/2013    Class: Chronic   At high risk for falls 65/68/1275   Systolic heart failure ([place]) 06/07/2010   Congestive dilated cardiomyopathy ([place]) 05/29/2010   Atrial fibrillation ([place]) 04/16/2009   Elevated lipids 04/13/2009   Osteoarthritis 04/13/2009   Pamela Whitaker is an 25yoF with hx some degree of more recent cognitive decline, Afib, CHF, remote hx breast cancer, OA, here with partially obstructing sigmoid colon adenocarcinoma which is perforated, presumed peritoneal carcinomatosis with some degree of right ureteral obstruction  -Currently afebrile, normal vital signs, comfortable -I spent ~ 40 minutes at bedside going over everything at length with her daughter. We discussed her perforated partially obstructing sigmoid colon cancer with resultant pelvic abscesses. We discussed what peritoneal carcinomatosis represents and the findings on CT scan are certainly suggestive of this. We discussed natural history of this disease process  including average life expectancies with carcinomatosis alone being ~ 6 months, but with her other health conditions, now perforated appearance on CT and evolving abscesses, potentially much shorter. No clear way of knowing the future. We discussed palliative loop colostomy to divert the stool stream and alleviate the partial obstruction. We discussed this would not be curative and subsequently in the ensuing weeks to months subsequent  obstructions elsewhere in her abdomen related to the peritoneal carcinomatosis being highly likely. We discussed 'comfort' options which she is currently leaning towards pursuing at this juncture. Her sister is flying in from [place] and she would like to discuss with her as well. She would like to continue antibiotics if at all possible which I think is reasonable. She asked appropriate questions; I told her with her progressive cognitive issues, now peritoneal carcinomatosis and perforated colon cancer, 'comfort' options being a realistic and rational thing to work towards. -We will have palliative care see for further discussions about goals of care -She would like to hold off on IR until we have clear plan in place and she can further discuss with palliative and her sister -Continue IV Abx   LOS: 1 day   Nadeen Landau, MD [facility], [place]

## 2021-04-23 NOTE — Discharge Summary (Signed)
[facility] Discharge Summary   Patient ID: Pamela Whitaker MRN: [id number] DOB/AGE: December 23, 1932 87 y.o.  Admit date: 04/21/2021 Discharge date: 04/23/2021  Admitting Diagnosis: Perforated partially obstructing sigmoid colon adenocarcinoma, presumed peritoneal carcinomatosis with some degree of right ureteral obstruction   Consultants Palliative medicine  Imaging: CT Abdomen Pelvis Wo Contrast  Result Date: 04/21/2021 CLINICAL DATA:  Abdominal distension. Recent diagnosis of colon cancer. EXAM: CT ABDOMEN AND PELVIS WITHOUT CONTRAST TECHNIQUE: Multidetector CT imaging of the abdomen and pelvis was performed following the standard protocol without IV contrast. COMPARISON:  CT abdomen and pelvis 03/19/2021. FINDINGS: Lower chest: There is a stable 3 mm nodular density in the left lower lobe. Hepatobiliary: No focal liver abnormality is seen. No gallstones, gallbladder wall thickening, or biliary dilatation. Pancreas: Unremarkable. No pancreatic ductal dilatation or surrounding inflammatory changes. Spleen: Normal in size without focal abnormality. Adrenals/Urinary Tract: Right renal cyst is again noted. There are subcentimeter hyperdense areas in the mid and lower right kidney which may represent complex cysts. There is a punctate nonobstructing calculus in the superior pole the right kidney. There is moderate right-sided hydroureteronephrosis without obstructing calculus identified to the level of the pelvis, unchanged. The bladder, left kidney and adrenal glands are within normal limits. Stomach/Bowel: Sigmoid colon wall thickening is again seen compatible with patient's known carcinoma. There is resultant colonic obstruction. Upstream colon is dilated with air-fluid levels diffusely. There is a small amount of free air in the pelvis image 2/75 compatible with bowel perforation. Small bowel and stomach are nondilated. There is a small hiatal hernia. There is wall thickening of ileal  loops in the right lower quadrant with surrounding inflammation worrisome for enteritis. Additionally, there are several air-fluid collections in the pelvis bilaterally and cul-de-sac worrisome for abscess formation. These are difficult to evaluate with lack of oral and intravenous contrast. The largest collection is in the pelvic cul-de-sac extending into the lower pelvis measuring 6.9 x 5.0 x 8.6 cm. There is likely a collection in the left pelvis which may or may not be connected to the cul-de-sac collection image 2/75 measuring 4.2 x 1.9 x 2.5 cm. There is also collection in the right lower quadrant measuring 2.8 x 3.5 by 3.8 cm image 2/74. Vascular/Lymphatic: Aortic atherosclerosis. No enlarged abdominal or pelvic lymph nodes. Reproductive: Calcified uterine fibroids are present. Ovaries are not well delineated. Other: There is a small amount of free fluid in the right lower quadrant. Please see description of stomach and bowel. There are new likely metastatic deposits along the omentum in the upper abdomen image 2/47. The largest measures 11 x 20 mm. There is no abdominal wall hernia. Musculoskeletal: Degenerative changes affect the spine. Left breast implant present. IMPRESSION: 1. Sigmoid colon mass again noted. There is resultant colonic obstruction. There is a tiny amount of free air in the pelvis compatible with bowel perforation. 2. Interval development of several air-fluid collections throughout the pelvis and lower abdomen worrisome for abscesses. The largest is in the pelvic cul-de-sac measuring 6.9 x 5.0 x 8.6 cm. 3. Wall thickening and inflammation of ileal loops in the right lower quadrant worrisome for enteritis. 4. Stable moderate right-sided hydroureteronephrosis to the level the pelvis. No obstructing calculus identified. 5. New peritoneal implants along the omentum concerning for metastatic disease. Electronically Signed   By: Ronney Asters M.D.   On: 04/21/2021 20:56     Procedures None  Hospital Course:  Pamela Whitaker is an 87yo female recently diagnosed with adenocarcinoma of the sigmoid  colon on colonoscopy, who presented to [facility] 11/27 with worsening abdominal pain.  Workup showed White blood cell count is elevated at 26,000.  CT scan of the abdomen and pelvis demonstrates the tumor in the sigmoid colon with probable contained perforation; there appears to be a large abscess in the pelvis; there also appeared to be omental metastases.  Patient was admitted to the surgical service. Palliative medicine was consulted for assistance with goals of care, and ultimately the patient/ family decided upon comfort and to allow for a natural death to occur without suffering. Patient was discharged to hospice facility.    Allergies as of 04/23/2021       Reactions   Amiodarone Other (See Comments)   Unknown reaction (Stopped on her own)   Lipitor [atorvastatin] Other (See Comments)   weakness   Multaq [dronedarone] Other (See Comments)   Unknown reaction (Stopped on her own)        Medication List     STOP taking these medications    acetaminophen 500 MG tablet Commonly known as: TYLENOL   carvedilol 6.25 MG tablet Commonly known as: COREG   dicyclomine 10 MG capsule Commonly known as: BENTYL   ICY HOT EX   multivitamin with minerals Tabs tablet   omeprazole 20 MG capsule Commonly known as: PRILOSEC   omeprazole 20 MG tablet Commonly known as: PRILOSEC OTC   traMADol 50 MG tablet Commonly known as: ULTRAM   Xarelto 20 MG Tabs tablet Generic drug: rivaroxaban           Signed: Wellington Hampshire, PA-C [facility] 04/23/2021, 11:08 AM Please see Amion for pager number during day hours 7:00am-4:30pm

## 2021-04-23 NOTE — Progress Notes (Signed)
  Subjective No acute events. Resting comfortably.  Objective: Vital signs in last 24 hours: Temp:  [97.5 F (36.4 C)-97.7 F (36.5 C)] 97.7 F (36.5 C) (11/28 2059) Pulse Rate:  [87-109] 104 (11/28 2059) Resp:  [18-20] 20 (11/28 2059) BP: (93-117)/(55-70) 93/55 (11/28 2059) SpO2:  [96 %-97 %] 96 % (11/28 2059) Last BM Date: 04/21/21  Intake/Output from previous day: 11/28 0701 - 11/29 0700 In: 1372 [P.O.:150; I.V.:628.2; IV Piggyback:593.7] Out: 125 [Urine:125] Intake/Output this shift: Total I/O In: -  Out: 100 [Urine:100]  Gen: NAD, comfortable Pulm: Normal work of breathing Abd: Soft, moderately distended Ext: SCDs in place  Lab Results: CBC  Recent Labs    04/21/21 1929 04/21/21 1958 04/22/21 0747  WBC 26.3*  --  24.4*  HGB 9.7* 9.9* 9.4*  HCT 29.7* 29.0* 29.1*  PLT 223  --  297   BMET Recent Labs    04/21/21 1929 04/21/21 1958 04/22/21 0348  NA 134* 138 135  K 2.6* 2.5* 2.8*  CL 103 103 106  CO2 23  --  22  GLUCOSE 151* 139* 150*  BUN 43* 36* 41*  CREATININE 1.49* 1.40* 1.57*  CALCIUM 9.2  --  8.9   PT/INR No results for input(s): LABPROT, INR in the last 72 hours. ABG No results for input(s): PHART, HCO3 in the last 72 hours.  Invalid input(s): PCO2, PO2  Studies/Results:  Anti-infectives: Anti-infectives (From admission, onward)    Start     Dose/Rate Route Frequency Ordered Stop   04/22/21 0600  piperacillin-tazobactam (ZOSYN) IVPB 3.375 g  Status:  Discontinued        3.375 g 12.5 mL/hr over 240 Minutes Intravenous Every 8 hours 04/21/21 2318 04/22/21 1630   04/21/21 2130  piperacillin-tazobactam (ZOSYN) IVPB 3.375 g        3.375 g 100 mL/hr over 30 Minutes Intravenous  Once 04/21/21 2126 04/21/21 2257        Assessment/Plan: Patient Active Problem List   Diagnosis Date Noted   Colon carcinoma metastatic to multiple sites ([place]) 04/21/2021   Pelvic abscess in female 04/21/2021   Elevated carcinoembryonic antigen (CEA)  04/21/2021   Adenocarcinoma of colon ([place]) 04/21/2021   Adenocarcinoma of sigmoid colon ([place]) 04/16/2021   Hydroureter on right 04/16/2021   Leukocytosis 06/15/2018   Osteoporosis 03/02/2017   Urinary incontinence 10/14/2015   Routine general medical examination at a health care facility 12/06/2014   HX: breast cancer 06/29/2014   Hypertension    Vitamin D deficiency    Spinal stenosis, lumbar region, with neurogenic claudication 03/28/2013    Class: Chronic   At high risk for falls 41/28/7867   Systolic heart failure ([place]) 06/07/2010   Congestive dilated cardiomyopathy ([place]) 05/29/2010   Atrial fibrillation ([place]) 04/16/2009   Elevated lipids 04/13/2009   Osteoarthritis 04/13/2009   Pamela Whitaker is an 71yoF with hx some degree of more recent cognitive decline, Afib, CHF, remote hx breast cancer, OA, here with partially obstructing sigmoid colon adenocarcinoma which is perforated, presumed peritoneal carcinomatosis with some degree of right ureteral obstruction  -Greatly appreciate palliative care's involvement and input in her care. Noted updates to goals of care including now transitioning to hospice   LOS: 2 days   Nadeen Landau, MD [facility], [place]

## 2021-04-23 NOTE — TOC Transition Note (Signed)
Transition of Care [facility]) - CM/SW Discharge Note  Patient Details  Name: Pamela Whitaker MRN: [id number] Date of Birth: 05-30-1932  [facility] CM/SW Contact:  Sherie Don, LCSW Phone Number: 04/23/2021, 11:13 AM  Clinical Narrative: TOC received consult for [facility]. CSW met with family, who confirmed they want the patient to be admitted to [place]. CSW spoke with Hillary at [facility] and confirmed the referral had already been made and the patient could be admitted today as consents have been signed. Hospice agreeable to DNR signed by PA as the surgeon has already rounded on the patient today. DNR signed by surgical PA. Discharge packet completed. The number for report is [phone]. Medical necessity form done; PTAR scheduled. TOC signing off.  Final next level of care: [place] Barriers to Discharge: Barriers Resolved  Patient Goals and CMS Choice Patient states their goals for this hospitalization and ongoing recovery are:: [facility] CMS Medicare.gov Compare Post Acute Care list provided to:: Patient Represenative (must comment) Choice offered to / list presented to : Adult Children  Discharge Placement Patient to be transferred to facility by: [place] Name of family member notified: [name] (daughter) Patient and family notified of of transfer: 04/23/21  Discharge Plan and Services       DME Arranged: N/A DME Agency: NA [place] [facility] [place]  Readmission Risk Interventions No flowsheet data found.

## 2021-04-23 NOTE — Plan of Care (Signed)
  Problem: Pain Managment: Goal: General experience of comfort will improve Outcome: Progressing   Problem: Safety: Goal: Ability to remain free from injury will improve Outcome: Progressing   

## 2021-04-23 NOTE — Progress Notes (Signed)
Called [facility] [place] [facility] and given report to Debie, Therapist, sports.

## 2021-04-24 ENCOUNTER — Other Ambulatory Visit (HOSPITAL_COMMUNITY): Payer: Medicare PPO

## 2021-05-26 DEATH — deceased

## 2021-05-28 ENCOUNTER — Encounter (HOSPITAL_COMMUNITY): Payer: Self-pay | Admitting: Hematology

## 2021-05-30 ENCOUNTER — Other Ambulatory Visit: Payer: Self-pay | Admitting: Cardiovascular Disease

## 2021-06-03 ENCOUNTER — Other Ambulatory Visit (HOSPITAL_COMMUNITY): Payer: Self-pay | Admitting: *Deleted

## 2021-06-03 DIAGNOSIS — D72829 Elevated white blood cell count, unspecified: Secondary | ICD-10-CM

## 2021-06-03 DIAGNOSIS — M810 Age-related osteoporosis without current pathological fracture: Secondary | ICD-10-CM

## 2021-06-03 DIAGNOSIS — Z1231 Encounter for screening mammogram for malignant neoplasm of breast: Secondary | ICD-10-CM

## 2021-06-04 ENCOUNTER — Encounter (HOSPITAL_COMMUNITY): Payer: Self-pay | Admitting: Hematology

## 2021-06-04 ENCOUNTER — Inpatient Hospital Stay (HOSPITAL_COMMUNITY)

## 2021-06-11 ENCOUNTER — Ambulatory Visit (HOSPITAL_COMMUNITY): Payer: Medicare PPO | Admitting: Hematology

## 2021-07-12 ENCOUNTER — Other Ambulatory Visit: Payer: Self-pay | Admitting: Internal Medicine
